# Patient Record
Sex: Female | Born: 1955 | Race: White | Hispanic: No | Marital: Married | Smoking: Never smoker
Health system: Southern US, Community
[De-identification: ages and names within clinical notes are randomized; demographics above are authoritative.]

## PROBLEM LIST (undated history)

## (undated) DIAGNOSIS — G4730 Sleep apnea, unspecified: Secondary | ICD-10-CM

## (undated) DIAGNOSIS — N809 Endometriosis, unspecified: Secondary | ICD-10-CM

## (undated) DIAGNOSIS — C50919 Malignant neoplasm of unspecified site of unspecified female breast: Secondary | ICD-10-CM

## (undated) DIAGNOSIS — I10 Essential (primary) hypertension: Secondary | ICD-10-CM

## (undated) DIAGNOSIS — Z923 Personal history of irradiation: Secondary | ICD-10-CM

## (undated) DIAGNOSIS — K219 Gastro-esophageal reflux disease without esophagitis: Secondary | ICD-10-CM

## (undated) DIAGNOSIS — F419 Anxiety disorder, unspecified: Secondary | ICD-10-CM

## (undated) DIAGNOSIS — S82409A Unspecified fracture of shaft of unspecified fibula, initial encounter for closed fracture: Secondary | ICD-10-CM

## (undated) DIAGNOSIS — M8580 Other specified disorders of bone density and structure, unspecified site: Secondary | ICD-10-CM

## (undated) DIAGNOSIS — C801 Malignant (primary) neoplasm, unspecified: Secondary | ICD-10-CM

## (undated) DIAGNOSIS — E039 Hypothyroidism, unspecified: Secondary | ICD-10-CM

## (undated) DIAGNOSIS — E785 Hyperlipidemia, unspecified: Secondary | ICD-10-CM

## (undated) DIAGNOSIS — C50211 Malignant neoplasm of upper-inner quadrant of right female breast: Secondary | ICD-10-CM

## (undated) DIAGNOSIS — J302 Other seasonal allergic rhinitis: Secondary | ICD-10-CM

## (undated) DIAGNOSIS — F32A Depression, unspecified: Secondary | ICD-10-CM

## (undated) HISTORY — PX: REDUCTION MAMMAPLASTY: SUR839

## (undated) HISTORY — PX: FOOT SURGERY: SHX648

## (undated) HISTORY — DX: Hyperlipidemia, unspecified: E78.5

## (undated) HISTORY — DX: Endometriosis, unspecified: N80.9

## (undated) HISTORY — DX: Other specified disorders of bone density and structure, unspecified site: M85.80

## (undated) HISTORY — DX: Malignant (primary) neoplasm, unspecified: C80.1

## (undated) HISTORY — DX: Hypothyroidism, unspecified: E03.9

## (undated) HISTORY — DX: Malignant neoplasm of upper-inner quadrant of right female breast: C50.211

## (undated) HISTORY — DX: Anxiety disorder, unspecified: F41.9

## (undated) HISTORY — DX: Depression, unspecified: F32.A

## (undated) HISTORY — DX: Essential (primary) hypertension: I10

---
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
## 1993-01-23 DIAGNOSIS — C801 Malignant (primary) neoplasm, unspecified: Secondary | ICD-10-CM

## 1993-01-23 HISTORY — DX: Malignant (primary) neoplasm, unspecified: C80.1

## 1994-01-23 DIAGNOSIS — Z85850 Personal history of malignant neoplasm of thyroid: Secondary | ICD-10-CM

## 1994-01-23 HISTORY — DX: Personal history of malignant neoplasm of thyroid: Z85.850

## 2003-01-24 HISTORY — PX: ABDOMINAL HYSTERECTOMY: SHX81

## 2004-01-11 ENCOUNTER — Inpatient Hospital Stay: Payer: Self-pay | Admitting: Obstetrics and Gynecology

## 2005-01-19 ENCOUNTER — Ambulatory Visit: Payer: Self-pay | Admitting: Internal Medicine

## 2005-04-21 ENCOUNTER — Ambulatory Visit: Payer: Self-pay | Admitting: Obstetrics and Gynecology

## 2005-08-29 ENCOUNTER — Ambulatory Visit: Payer: Self-pay | Admitting: Family Medicine

## 2006-02-20 LAB — HM COLONOSCOPY: HM Colonoscopy: ABNORMAL

## 2006-04-25 ENCOUNTER — Ambulatory Visit: Payer: Self-pay | Admitting: Obstetrics and Gynecology

## 2006-04-27 ENCOUNTER — Ambulatory Visit: Payer: Self-pay | Admitting: Obstetrics and Gynecology

## 2006-12-12 ENCOUNTER — Ambulatory Visit: Payer: Self-pay | Admitting: Gastroenterology

## 2006-12-24 HISTORY — PX: COLON SURGERY: SHX602

## 2006-12-26 ENCOUNTER — Ambulatory Visit: Payer: Self-pay | Admitting: Gastroenterology

## 2007-01-03 ENCOUNTER — Ambulatory Visit: Payer: Self-pay | Admitting: Surgery

## 2007-01-04 ENCOUNTER — Ambulatory Visit: Payer: Self-pay | Admitting: Family Medicine

## 2007-01-09 ENCOUNTER — Inpatient Hospital Stay: Payer: Self-pay | Admitting: Surgery

## 2007-01-24 DIAGNOSIS — N809 Endometriosis, unspecified: Secondary | ICD-10-CM

## 2007-01-24 HISTORY — PX: OTHER SURGICAL HISTORY: SHX169

## 2007-01-24 HISTORY — PX: COLONOSCOPY: SHX174

## 2007-01-24 HISTORY — DX: Endometriosis, unspecified: N80.9

## 2008-01-12 ENCOUNTER — Emergency Department: Payer: Self-pay | Admitting: Emergency Medicine

## 2009-11-20 LAB — HM PAP SMEAR: HM Pap smear: NORMAL

## 2010-08-08 ENCOUNTER — Ambulatory Visit: Payer: Self-pay | Admitting: Internal Medicine

## 2010-10-07 ENCOUNTER — Encounter: Payer: Self-pay | Admitting: Internal Medicine

## 2010-10-07 DIAGNOSIS — Z0289 Encounter for other administrative examinations: Secondary | ICD-10-CM

## 2010-10-12 ENCOUNTER — Ambulatory Visit (INDEPENDENT_AMBULATORY_CARE_PROVIDER_SITE_OTHER): Payer: BC Managed Care – PPO | Admitting: Internal Medicine

## 2010-10-12 ENCOUNTER — Encounter: Payer: Self-pay | Admitting: Internal Medicine

## 2010-10-12 VITALS — BP 155/109 | HR 90 | Temp 97.8°F | Resp 16 | Ht 61.5 in | Wt 151.0 lb

## 2010-10-12 DIAGNOSIS — J189 Pneumonia, unspecified organism: Secondary | ICD-10-CM

## 2010-10-12 DIAGNOSIS — F32A Depression, unspecified: Secondary | ICD-10-CM | POA: Insufficient documentation

## 2010-10-12 DIAGNOSIS — E785 Hyperlipidemia, unspecified: Secondary | ICD-10-CM

## 2010-10-12 DIAGNOSIS — E039 Hypothyroidism, unspecified: Secondary | ICD-10-CM

## 2010-10-12 DIAGNOSIS — F3289 Other specified depressive episodes: Secondary | ICD-10-CM

## 2010-10-12 DIAGNOSIS — F329 Major depressive disorder, single episode, unspecified: Secondary | ICD-10-CM

## 2010-10-12 MED ORDER — LEVOFLOXACIN 750 MG PO TABS: 750.0000 mg | ORAL_TABLET | Freq: Every day | ORAL | Status: AC

## 2010-10-12 MED ORDER — BENZONATATE 100 MG PO CAPS: 100.0000 mg | ORAL_CAPSULE | Freq: Three times a day (TID) | ORAL | Status: AC | PRN

## 2010-10-12 NOTE — Patient Instructions (Signed)
Pneumonia Pneumonia is an infection of the lungs. It may be caused by a bacteria or virus. Most forms are bacterial. Usually, these infections are caused by breathing infectious particles into the lungs (respiratory tract). SYMPTOMS  The most common problems (symptoms) are:   Cough.  Fever.   Chest pain.  Increased rate of breathing.   Wheezing.  Mucus production.   DIAGNOSIS  Often these infections are diagnosed on exam by your caregiver. Sometimes the diagnosis may require:   Chest X-rays.   Blood analysis.   Cultures. Blood cultures may be done to help find the cause of your pneumonia.  Your caregiver may do tests (blood gasses or pulse oximetry) to see how well your lungs are working. TREATMENT The bacterial pneumonias generally respond well to medicines (antibiotics) that kill germs. Viral infections must run their course. These infections will not respond to antibiotics. A pneumococcal shot (vaccine) is available to prevent a common bacterial pneumonia. This is usually suggested for the elderly and for other groups of higher risk individuals, such as those on chemotherapy or those who have problems with their immune system.  You will have pneumococcal screening or vaccination if you are over 65 years old and are not immunized.   If you are a smoker, it is time to quit. You may receive instructions on how to best stop smoking. Your caregiver can provide medicines and counseling to help you quit.  HOME CARE INSTRUCTIONS  Cough suppressants may be used if you are losing too much rest. However, coughing protects you by clearing your lungs. This is one reason for not using cough suppressants, if able, as they take away this protection.   Your caregiver may have prescribed an antibiotic if she or he feels your cough is caused by a bacterial infection. Take all your medicine until you are finished.   Your caregiver may also prescribe an expectorant to loosen the mucus to be coughed  up.   Only take over-the-counter or prescription medicines for pain, discomfort, or fever as directed by your caregiver.   Smoking is a common cause of bronchitis and can contribute to pneumonia. Stopping this habit is an important self-help step.   If you are a smoker and continue to smoke, your cough may last several weeks after your pneumonia has cleared.   A cold steam vaporizer or humidifier in your room or home may help loosen mucus.   Coughing is often worse at night. Sleeping in a semi-upright position in a recliner or using a couple pillows under your head will help with this.   Get rest as you feel it is needed. Your body will usually let you know when to rest.  SEEK IMMEDIATE MEDICAL CARE IF:  You develop pus-like mucus (sputum) or your illness becomes worse. This is especially true if you are elderly or weakened from any other disease.   You cannot control your cough with suppressants and are losing sleep.   You begin coughing up blood.   You develop pain which is getting worse or is uncontrolled with medicines.   You or your child has an oral temperature above 101.5, not controlled by medicine.   Any of the symptoms which initially brought you in for treatment are getting worse rather than better.   You develop shortness of breath or chest pain.  MAKE SURE YOU:   Understand these instructions.   Will watch your condition.   Will get help right away if you are not doing well or   get worse.  Document Released: 01/09/2005 Document Re-Released: 06/29/2009 [employer] Patient Information 2011 [employer]. 

## 2010-10-12 NOTE — Progress Notes (Signed)
Subjective:    Patient ID: Christie Ramirez, female    DOB: 12/06/1955, 55 y.o.   MRN: [id number]  HPI Ms. Boehringer is a 55 year old female with a history of hyperlipidemia and hypothyroidism who presents for an acute visit complaining of a six-day history of general malaise, cough, nasal congestion with purulent rhinorrhea,Headache, and bilateral ear pain. Patient reports that several others in her workplace are sick with similar symptoms. She reports that she has gotten progressively worse over the last 6 days. She reports fever and chills at home. She has had difficulty sleeping because of her cough. She has not taken any over-the-counter medications for this. She denies any shortness of breath, chest pain, palpitations.   Review of Systems  Constitutional: Negative for fever, chills, appetite change, fatigue and unexpected weight change.  HENT: Positive for ear pain, congestion, sore throat, rhinorrhea, postnasal drip and sinus pressure. Negative for trouble swallowing, neck pain and voice change.   Eyes: Negative for visual disturbance.  Respiratory: Positive for cough. Negative for chest tightness, shortness of breath, wheezing and stridor.   Cardiovascular: Negative for chest pain, palpitations and leg swelling.  Gastrointestinal: Negative for nausea, vomiting, abdominal pain, diarrhea, constipation, blood in stool, abdominal distention and anal bleeding.  Genitourinary: Negative for dysuria and flank pain.  Musculoskeletal: Positive for myalgias. Negative for arthralgias and gait problem.  Skin: Negative for color change and rash.  Neurological: Negative for dizziness and headaches.  Hematological: Negative for adenopathy. Does not bruise/bleed easily.  Psychiatric/Behavioral: Negative for suicidal ideas, sleep disturbance and dysphoric mood. The patient is not nervous/anxious.    BP 155/109  Pulse 90  Temp(Src) 97.8 F (36.6 C) (Oral)  Resp 16  Ht 5' 1.5" (1.562 m)  Wt 151 lb  (68.493 kg)  BMI 28.07 kg/m2  SpO2 95%     Objective:   Physical Exam  Constitutional: She is oriented to person, place, and time. She appears well-developed and well-nourished. She has a sickly appearance. No distress.  HENT:  Head: Normocephalic and atraumatic.  Right Ear: Hearing and external ear normal. No tenderness. Tympanic membrane is erythematous and bulging. A middle ear effusion is present.  Left Ear: Hearing and external ear normal. No tenderness. Tympanic membrane is bulging. Tympanic membrane is not erythematous. A middle ear effusion is present.  Nose: Right sinus exhibits maxillary sinus tenderness and frontal sinus tenderness. Left sinus exhibits maxillary sinus tenderness and frontal sinus tenderness.  Mouth/Throat: Mucous membranes are normal. Posterior oropharyngeal erythema present. No oropharyngeal exudate.  Eyes: Conjunctivae are normal. Pupils are equal, round, and reactive to light. Right eye exhibits no discharge. Left eye exhibits no discharge. No scleral icterus.  Neck: Normal range of motion. Neck supple. No tracheal deviation present. No thyromegaly present.  Cardiovascular: Normal rate, regular rhythm, normal heart sounds and intact distal pulses.  Exam reveals no gallop and no friction rub.   No murmur heard. Pulmonary/Chest: Effort normal. No accessory muscle usage. Not tachypneic. No respiratory distress. She has decreased breath sounds in the right lower field. She has no wheezes. She has rhonchi in the right lower field. She has no rales. She exhibits no tenderness.  Musculoskeletal: Normal range of motion. She exhibits no edema and no tenderness.  Lymphadenopathy:    She has no cervical adenopathy.  Neurological: She is alert and oriented to person, place, and time. No cranial nerve deficit. She exhibits normal muscle tone. Coordination normal.  Skin: Skin is warm and dry. No rash noted. She is not diaphoretic. No  erythema. No pallor.  Psychiatric: She has  a normal mood and affect. Her behavior is normal. Judgment and thought content normal.          Assessment & Plan:  1. Pneumonia - clinical presentation and exam are consistent with right lower lobe pneumonia. She also has findings suggestive of bilateral maxillary sinusitis. Will treat empirically with Levaquin 750 mg daily x7 days. She will use Tessalon Perles as needed twice daily for cough. She will call or return to clinic immediately should symptoms worsen or not improve over the next 48 hours. She will stay home from work and rest for the next 48hours. Otherwise she will followup in one month for her annual physical exam.  2. Hypothyroidism - patient is overdue for blood work including TSH. We'll check prior to her physical exam in one month. We'll continue Synthroid.  3. Hyperlipidemia - will check lipids prior to her physical exam. She will continue Lipitor.  4. Depression - currently well controlled on current medications. Will revisit at physical exam in one month.

## 2010-10-14 ENCOUNTER — Encounter: Payer: Self-pay | Admitting: Internal Medicine

## 2010-10-14 ENCOUNTER — Ambulatory Visit (INDEPENDENT_AMBULATORY_CARE_PROVIDER_SITE_OTHER): Payer: BC Managed Care – PPO | Admitting: Internal Medicine

## 2010-10-14 VITALS — BP 135/91 | HR 87 | Temp 98.6°F | Resp 16 | Ht 61.5 in | Wt 150.0 lb

## 2010-10-14 DIAGNOSIS — J189 Pneumonia, unspecified organism: Secondary | ICD-10-CM

## 2010-10-14 DIAGNOSIS — Z0000 Encounter for general adult medical examination without abnormal findings: Secondary | ICD-10-CM

## 2010-10-14 NOTE — Progress Notes (Signed)
Subjective:    Patient ID: Christie Ramirez, female    DOB: [birth year], 55 y.o.   MRN: [id number]  HPI Christie Ramirez is a 55 year old female with a recent history of upper respiratory infection and right middle lobe pneumonia. She was diagnosed with pneumonia on September 19. She started a course of Levaquin. She reports some improvement in her symptoms, however her symptoms have not completely resolved. Her ear pain and sinus pain have improved. She continues to have some coughing, particularly at night. She reports significant improvement in her coughing with Tessalon Perles. She denies any recent fever or chills. She reports continued fatigue. She has missed work over the last 2 days.  Outpatient Encounter Prescriptions as of 10/14/2010  Medication Sig Dispense Refill  . atorvastatin (LIPITOR) 10 MG tablet Take 10 mg by mouth daily.        . benzonatate (TESSALON PERLES) 100 MG capsule Take 1 capsule (100 mg total) by mouth 3 (three) times daily as needed for cough.  20 capsule  0  . clonazePAM (KLONOPIN) 1 MG tablet Take 1 mg by mouth 3 (three) times daily as needed.        Marland Kitchen levofloxacin (LEVAQUIN) 750 MG tablet Take 1 tablet (750 mg total) by mouth daily.  7 tablet  0  . levothyroxine (SYNTHROID, LEVOTHROID) 175 MCG tablet Take 175 mcg by mouth daily.        Marland Kitchen zolpidem (AMBIEN) 10 MG tablet Take 10 mg by mouth at bedtime as needed.          Review of Systems  Constitutional: Positive for fatigue. Negative for fever, chills and diaphoresis.  HENT: Positive for congestion, rhinorrhea and postnasal drip. Negative for sinus pressure.   Respiratory: Positive for cough. Negative for shortness of breath, wheezing and stridor.   Cardiovascular: Negative for chest pain.   BP 135/91  Pulse 87  Temp(Src) 98.6 F (37 C) (Oral)  Resp 16  Ht 5' 1.5" (1.562 m)  Wt 150 lb (68.04 kg)  BMI 27.88 kg/m2  SpO2 95%     Objective:   Physical Exam  Constitutional: She is oriented to person, place,  and time. She appears well-developed and well-nourished. No distress.  HENT:  Head: Normocephalic and atraumatic.  Right Ear: External ear normal. A middle ear effusion is present.  Left Ear: External ear normal. A middle ear effusion is present.  Nose: Nose normal.  Mouth/Throat: Oropharynx is clear and moist. No oropharyngeal exudate.  Eyes: Conjunctivae are normal. Pupils are equal, round, and reactive to light. Right eye exhibits no discharge. Left eye exhibits no discharge. No scleral icterus.  Neck: Normal range of motion. Neck supple. No tracheal deviation present. No thyromegaly present.  Cardiovascular: Normal rate, regular rhythm, normal heart sounds and intact distal pulses.  Exam reveals no gallop and no friction rub.   No murmur heard. Pulmonary/Chest: Effort normal. No accessory muscle usage. Not tachypneic. No respiratory distress. She has no wheezes. She has rales in the right middle field. She exhibits no tenderness.  Musculoskeletal: Normal range of motion. She exhibits no edema and no tenderness.  Lymphadenopathy:    She has no cervical adenopathy.  Neurological: She is alert and oriented to person, place, and time. No cranial nerve deficit. She exhibits normal muscle tone. Coordination normal.  Skin: Skin is warm and dry. No rash noted. She is not diaphoretic. No erythema. No pallor.  Psychiatric: She has a normal mood and affect. Her behavior is normal. Judgment and thought content normal.  Assessment & Plan:  1. Pneumonia - patient's symptoms and physical exam consistent with right middle lobe pneumonia. Her symptoms have improved and fever has resolved. She continues to have cough which is worse at night. This is improved with use of Tessalon Perles. We'll plan for her to continue her course of Levaquin for a total of 7 days. She will continue to use over-the-counter ibuprofen. She will remain out of work for the next 48 hours. She will call or return to clinic  should symptoms not continue to improve or should symptoms worsen.

## 2010-10-17 ENCOUNTER — Telehealth: Payer: Self-pay | Admitting: Internal Medicine

## 2010-10-17 NOTE — Telephone Encounter (Signed)
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 Please advise.

## 2010-11-03 ENCOUNTER — Other Ambulatory Visit: Payer: Self-pay | Admitting: Internal Medicine

## 2010-11-04 ENCOUNTER — Other Ambulatory Visit (INDEPENDENT_AMBULATORY_CARE_PROVIDER_SITE_OTHER): Payer: BC Managed Care – PPO | Admitting: *Deleted

## 2010-11-04 ENCOUNTER — Encounter: Payer: Self-pay | Admitting: Internal Medicine

## 2010-11-04 DIAGNOSIS — E039 Hypothyroidism, unspecified: Secondary | ICD-10-CM

## 2010-11-04 DIAGNOSIS — E785 Hyperlipidemia, unspecified: Secondary | ICD-10-CM

## 2010-11-04 DIAGNOSIS — Z0000 Encounter for general adult medical examination without abnormal findings: Secondary | ICD-10-CM

## 2010-11-04 LAB — COMPREHENSIVE METABOLIC PANEL
ALT: 36 U/L — ABNORMAL HIGH (ref 0–35)
AST: 27 U/L (ref 0–37)
Albumin: 4.2 g/dL (ref 3.5–5.2)
CO2: 28 mEq/L (ref 19–32)
Calcium: 9.4 mg/dL (ref 8.4–10.5)
Chloride: 106 mEq/L (ref 96–112)
Creatinine, Ser: 1 mg/dL (ref 0.4–1.2)
GFR: 62.82 mL/min (ref >60.00)
Potassium: 4.8 mEq/L (ref 3.5–5.1)
Sodium: 142 mEq/L (ref 135–145)
Total Protein: 7.3 g/dL (ref 6.0–8.3)

## 2010-11-04 LAB — CBC WITH DIFFERENTIAL/PLATELET
Basophils Absolute: 0 10*3/uL (ref 0.0–0.1)
Eosinophils Relative: 2.5 % (ref 0.0–5.0)
HCT: 38.5 % (ref 36.0–46.0)
Lymphocytes Relative: 36.1 % (ref 12.0–46.0)
Lymphs Abs: 2.3 10*3/uL (ref 0.7–4.0)
Monocytes Relative: 7.6 % (ref 3.0–12.0)
Platelets: 287 10*3/uL (ref 150.0–400.0)
RDW: 14.3 % (ref 11.5–14.6)
WBC: 6.4 10*3/uL (ref 4.5–10.5)

## 2010-11-04 LAB — LIPID PANEL: HDL: 63.3 mg/dL (ref >39.00)

## 2010-11-04 LAB — TSH: TSH: 0.58 u[IU]/mL (ref 0.35–5.50)

## 2010-11-04 LAB — LDL CHOLESTEROL, DIRECT: Direct LDL: 142.1 mg/dL

## 2010-11-05 LAB — ABO AND RH: Rh Type: POSITIVE

## 2010-11-11 ENCOUNTER — Encounter: Payer: BC Managed Care – PPO | Admitting: Internal Medicine

## 2010-11-21 ENCOUNTER — Ambulatory Visit (INDEPENDENT_AMBULATORY_CARE_PROVIDER_SITE_OTHER): Payer: BC Managed Care – PPO | Admitting: Internal Medicine

## 2010-11-21 ENCOUNTER — Encounter: Payer: Self-pay | Admitting: Internal Medicine

## 2010-11-21 VITALS — BP 170/110 | HR 90 | Temp 97.6°F | Resp 16 | Ht 61.5 in | Wt 150.0 lb

## 2010-11-21 DIAGNOSIS — M79609 Pain in unspecified limb: Secondary | ICD-10-CM

## 2010-11-21 DIAGNOSIS — Z23 Encounter for immunization: Secondary | ICD-10-CM

## 2010-11-21 DIAGNOSIS — E785 Hyperlipidemia, unspecified: Secondary | ICD-10-CM

## 2010-11-21 DIAGNOSIS — M79673 Pain in unspecified foot: Secondary | ICD-10-CM

## 2010-11-21 DIAGNOSIS — I10 Essential (primary) hypertension: Secondary | ICD-10-CM

## 2010-11-21 DIAGNOSIS — Z0000 Encounter for general adult medical examination without abnormal findings: Secondary | ICD-10-CM

## 2010-11-21 MED ORDER — ATORVASTATIN CALCIUM 20 MG PO TABS: 20.0000 mg | ORAL_TABLET | Freq: Every day | ORAL | Status: DC

## 2010-11-21 NOTE — Patient Instructions (Addendum)
Increase lipitor to 20mg  daily. Decrease intake of processed carbohydrates. Increase physical activity by walking or more most days of week.  Repeat labs in 3 months.  Hypertriglyceridemia  Diet for High blood levels of Triglycerides Most fats in food are triglycerides. Triglycerides in your blood are stored as fat in your body. High levels of triglycerides in your blood may put you at a greater risk for heart disease and stroke.  Normal triglyceride levels are less than 150 mg/dL. Borderline high levels are 150-199 mg/dl. High levels are 200 - 499 mg/dL, and very high triglyceride levels are greater than 500 mg/dL. The decision to treat high triglycerides is generally based on the level. For people with borderline or high triglyceride levels, treatment includes weight loss and exercise. Drugs are recommended for people with very high triglyceride levels. Many people who need treatment for high triglyceride levels have metabolic syndrome. This syndrome is a collection of disorders that often include: insulin resistance, high blood pressure, blood clotting problems, high cholesterol and triglycerides. TESTING PROCEDURE FOR TRIGLYCERIDES  You should not eat 4 hours before getting your triglycerides measured. The normal range of triglycerides is between 10 and 250 milligrams per deciliter (mg/dl). Some people may have extreme levels (1000 or above), but your triglyceride level may be too high if it is above 150 mg/dl, depending on what other risk factors you have for heart disease.   People with high blood triglycerides may also have high blood cholesterol levels. If you have high blood cholesterol as well as high blood triglycerides, your risk for heart disease is probably greater than if you only had high triglycerides. High blood cholesterol is one of the main risk factors for heart disease.  CHANGING YOUR DIET  Your weight can affect your blood triglyceride level. If you are more than 20% above  your ideal body weight, you may be able to lower your blood triglycerides by losing weight. Eating less and exercising regularly is the best way to combat this. Fat provides more calories than any other food. The best way to lose weight is to eat less fat. Only 30% of your total calories should come from fat. Less than 7% of your diet should come from saturated fat. A diet low in fat and saturated fat is the same as a diet to decrease blood cholesterol. By eating a diet lower in fat, you may lose weight, lower your blood cholesterol, and lower your blood triglyceride level.  Eating a diet low in fat, especially saturated fat, may also help you lower your blood triglyceride level. Ask your dietitian to help you figure how much fat you can eat based on the number of calories your caregiver has prescribed for you.  Exercise, in addition to helping with weight loss may also help lower triglyceride levels.   Alcohol can increase blood triglycerides. You may need to stop drinking alcoholic beverages.   Too much carbohydrate in your diet may also increase your blood triglycerides. Some complex carbohydrates are necessary in your diet. These may include bread, rice, potatoes, other starchy vegetables and cereals.   Reduce "simple" carbohydrates. These may include pure sugars, candy, honey, and jelly without losing other nutrients. If you have the kind of high blood triglycerides that is affected by the amount of carbohydrates in your diet, you will need to eat less sugar and less high-sugar foods. Your caregiver can help you with this.   Adding 2-4 grams of fish oil (EPA+ DHA) may also help lower triglycerides.  Speak with your caregiver before adding any supplements to your regimen.  Following the Diet  Maintain your ideal weight. Your caregivers can help you with a diet. Generally, eating less food and getting more exercise will help you lose weight. Joining a weight control group may also help. Ask your  caregivers for a good weight control group in your area.  Eat low-fat foods instead of high-fat foods. This can help you lose weight too.  These foods are lower in fat. Eat MORE of these:   Dried beans, peas, and lentils.   Egg whites.   Low-fat cottage cheese.   Fish.   Lean cuts of meat, such as round, sirloin, rump, and flank (cut extra fat off meat you fix).   Whole grain breads, cereals and pasta.   Skim and nonfat dry milk.   Low-fat yogurt.   Poultry without the skin.   Cheese made with skim or part-skim milk, such as mozzarella, parmesan, farmers', ricotta, or pot cheese.  These are higher fat foods. Eat LESS of these:   Whole milk and foods made from whole milk, such as American, blue, cheddar, monterey jack, and swiss cheese   High-fat meats, such as luncheon meats, sausages, knockwurst, bratwurst, hot dogs, ribs, corned beef, ground pork, and regular ground beef.   Fried foods.  Limit saturated fats in your diet. Substituting unsaturated fat for saturated fat may decrease your blood triglyceride level. You will need to read package labels to know which products contain saturated fats.  These foods are high in saturated fat. Eat LESS of these:   Fried pork skins.   Whole milk.   Skin and fat from poultry.   Palm oil.   Butter.   Shortening.   Cream cheese.   Tomasa Blase.   Margarines and baked goods made from listed oils.   Vegetable shortenings.   Chitterlings.   Fat from meats.   Coconut oil.   Palm kernel oil.   Lard.   Cream.   Sour cream.   Fatback.   Coffee whiteners and non-dairy creamers made with these oils.   Cheese made from whole milk.  Use unsaturated fats (both polyunsaturated and monounsaturated) moderately. Remember, even though unsaturated fats are better than saturated fats; you still want a diet low in total fat.  These foods are high in unsaturated fat:   Canola oil.   Sunflower oil.   Mayonnaise.   Almonds.    Peanuts.   Pine nuts.   Margarines made with these oils.   Safflower oil.   Olive oil.   Avocados.   Cashews.   Peanut butter.   Sunflower seeds.   Soybean oil.   Peanut oil.   Olives.   Pecans.   Walnuts.   Pumpkin seeds.  Avoid sugar and other high-sugar foods. This will decrease carbohydrates without decreasing other nutrients. Sugar in your food goes rapidly to your blood. When there is excess sugar in your blood, your liver may use it to make more triglycerides. Sugar also contains calories without other important nutrients.  Eat LESS of these:   Sugar, brown sugar, powdered sugar, jam, jelly, preserves, honey, syrup, molasses, pies, candy, cakes, cookies, frosting, pastries, colas, soft drinks, punches, fruit drinks, and regular gelatin.   Avoid alcohol. Alcohol, even more than sugar, may increase blood triglycerides. In addition, alcohol is high in calories and low in nutrients. Ask for sparkling water, or a diet soft drink instead of an alcoholic beverage.  Suggestions for planning and preparing  meals   Bake, broil, grill or roast meats instead of frying.   Remove fat from meats and skin from poultry before cooking.   Add spices, herbs, lemon juice or vinegar to vegetables instead of salt, rich sauces or gravies.   Use a non-stick skillet without fat or use no-stick sprays.   Cool and refrigerate stews and broth. Then remove the hardened fat floating on the surface before serving.   Refrigerate meat drippings and skim off fat to make low-fat gravies.   Serve more fish.   Use less butter, margarine and other high-fat spreads on bread or vegetables.   Use skim or reconstituted non-fat dry milk for cooking.   Cook with low-fat cheeses.   Substitute low-fat yogurt or cottage cheese for all or part of the sour cream in recipes for sauces, dips or congealed salads.   Use half yogurt/half mayonnaise in salad recipes.   Substitute evaporated skim milk  for cream. Evaporated skim milk or reconstituted non-fat dry milk can be whipped and substituted for whipped cream in certain recipes.   Choose fresh fruits for dessert instead of high-fat foods such as pies or cakes. Fruits are naturally low in fat.  When Dining Out   Order low-fat appetizers such as fruit or vegetable juice, pasta with vegetables or tomato sauce.   Select clear, rather than cream soups.   Ask that dressings and gravies be served on the side. Then use less of them.   Order foods that are baked, broiled, poached, steamed, stir-fried, or roasted.   Ask for margarine instead of butter, and use only a small amount.   Drink sparkling water, unsweetened tea or coffee, or diet soft drinks instead of alcohol or other sweet beverages.  QUESTIONS AND ANSWERS ABOUT OTHER FATS IN THE BLOOD: SATURATED FAT, TRANS FAT, AND CHOLESTEROL What is trans fat? Trans fat is a type of fat that is formed when vegetable oil is hardened through a process called hydrogenation. This process helps makes foods more solid, gives them shape, and prolongs their shelf life. Trans fats are also called hydrogenated or partially hydrogenated oils.  What do saturated fat, trans fat, and cholesterol in foods have to do with heart disease? Saturated fat, trans fat, and cholesterol in the diet all raise the level of LDL "bad" cholesterol in the blood. The higher the LDL cholesterol, the greater the risk for coronary heart disease (CHD). Saturated fat and trans fat raise LDL similarly.  What foods contain saturated fat, trans fat, and cholesterol? High amounts of saturated fat are found in animal products, such as fatty cuts of meat, chicken skin, and full-fat dairy products like butter, whole milk, cream, and cheese, and in tropical vegetable oils such as palm, palm kernel, and coconut oil. Trans fat is found in some of the same foods as saturated fat, such as vegetable shortening, some margarines (especially hard or  stick margarine), crackers, cookies, baked goods, fried foods, salad dressings, and other processed foods made with partially hydrogenated vegetable oils. Small amounts of trans fat also occur naturally in some animal products, such as milk products, beef, and lamb. Foods high in cholesterol include liver, other organ meats, egg yolks, shrimp, and full-fat dairy products. How can I use the new food label to make heart-healthy food choices? Check the Nutrition Facts panel of the food label. Choose foods lower in saturated fat, trans fat, and cholesterol. For saturated fat and cholesterol, you can also use the Percent Daily Value (%DV): 5% DV  or less is low, and 20% DV or more is high. (There is no %DV for trans fat.) Use the Nutrition Facts panel to choose foods low in saturated fat and cholesterol, and if the trans fat is not listed, read the ingredients and limit products that list shortening or hydrogenated or partially hydrogenated vegetable oil, which tend to be high in trans fat. POINTS TO REMEMBER: YOU NEED A LITTLE TLC (THERAPEUTIC LIFESTYLE CHANGES)  Discuss your risk for heart disease with your caregivers, and take steps to reduce risk factors.   Change your diet. Choose foods that are low in saturated fat, trans fat, and cholesterol.   Add exercise to your daily routine if it is not already being done. Participate in physical activity of moderate intensity, like brisk walking, for at least 30 minutes on most, and preferably all days of the week. No time? Break the 30 minutes into three, 10-minute segments during the day.   Stop smoking. If you do smoke, contact your caregiver to discuss ways in which they can help you quit.   Do not use street drugs.   Maintain a normal weight.   Maintain a healthy blood pressure.   Keep up with your blood work for checking the fats in your blood as directed by your caregiver.  Document Released: 10/28/2003 Document Revised: 09/21/2010 Document  Reviewed: 05/25/2008 [facility] Patient Information 2012 [place], [place].

## 2010-11-21 NOTE — Progress Notes (Signed)
Subjective:    Patient ID: Christie Ramirez, female    DOB: 04/06/1955, 55 y.o.   MRN: [id number]  HPI 55 year old female presents for her annual exam. She is tearful today, reporting recent breakup with her fianc. She has had several recent stressors including this breakup, and a change in her job with a significant cut in her pay. She reports sadness with these recent changes. She denies any suicidal ideation. She reports reasonable control and her anxiety with the use of her Klonopin.  Aside from this, she reports no concerns today. We reviewed her labs which were recently performed including lipid profile which showed elevated triglycerides. She reports recent increase in intake of processed carbohydrates. She also reports drinking approximately 2-3 glasses of wine most nights.  Outpatient Encounter Prescriptions as of 11/21/2010  Medication Sig Dispense Refill  . atorvastatin (LIPITOR) 20 MG tablet Take 1 tablet (20 mg total) by mouth daily.  30 tablet  3  . clonazePAM (KLONOPIN) 1 MG tablet Take 1 mg by mouth 3 (three) times daily as needed.        Marland Kitchen escitalopram (LEXAPRO) 20 MG tablet       . levothyroxine (SYNTHROID, LEVOTHROID) 175 MCG tablet Take 175 mcg by mouth daily.          Review of Systems  Constitutional: Negative for fever, chills, appetite change, fatigue and unexpected weight change.  HENT: Negative for ear pain, congestion, sore throat, trouble swallowing, neck pain, voice change and sinus pressure.   Eyes: Negative for visual disturbance.  Respiratory: Negative for cough, shortness of breath, wheezing and stridor.   Cardiovascular: Negative for chest pain, palpitations and leg swelling.  Gastrointestinal: Negative for nausea, vomiting, abdominal pain, diarrhea, constipation, blood in stool, abdominal distention and anal bleeding.  Genitourinary: Negative for dysuria and flank pain.  Musculoskeletal: Negative for myalgias, arthralgias and gait problem.  Skin: Negative  for color change and rash.  Neurological: Negative for dizziness and headaches.  Hematological: Negative for adenopathy. Does not bruise/bleed easily.  Psychiatric/Behavioral: Positive for dysphoric mood. Negative for suicidal ideas and sleep disturbance. The patient is nervous/anxious.    BP 170/110  Pulse 90  Temp(Src) 97.6 F (36.4 C) (Oral)  Resp 16  Ht 5' 1.5" (1.562 m)  Wt 150 lb (68.04 kg)  BMI 27.88 kg/m2  SpO2 96% Repeat BP 152/102    Objective:   Physical Exam  Constitutional: She is oriented to person, place, and time. She appears well-developed and well-nourished. No distress.  HENT:  Head: Normocephalic and atraumatic.  Right Ear: External ear normal.  Left Ear: External ear normal.  Nose: Nose normal.  Mouth/Throat: Oropharynx is clear and moist. No oropharyngeal exudate.  Eyes: Conjunctivae are normal. Pupils are equal, round, and reactive to light. Right eye exhibits no discharge. Left eye exhibits no discharge. No scleral icterus.  Neck: Normal range of motion. Neck supple. No tracheal deviation present. No thyromegaly present.  Cardiovascular: Normal rate, regular rhythm, normal heart sounds and intact distal pulses.  Exam reveals no gallop and no friction rub.   No murmur heard. Pulmonary/Chest: Effort normal and breath sounds normal. No respiratory distress. She has no wheezes. She has no rales. She exhibits no tenderness. Right breast exhibits no inverted nipple, no mass, no nipple discharge, no skin change and no tenderness. Left breast exhibits no inverted nipple, no mass, no nipple discharge, no skin change and no tenderness. Breasts are symmetrical.  Musculoskeletal: Normal range of motion. She exhibits no edema and no tenderness.  Feet:  Lymphadenopathy:    She has no cervical adenopathy.  Neurological: She is alert and oriented to person, place, and time. No cranial nerve deficit. She exhibits normal muscle tone. Coordination normal.  Skin: Skin is  warm and dry. No rash noted. She is not diaphoretic. No erythema. No pallor.  Psychiatric: Her behavior is normal. Judgment and thought content normal. She exhibits a depressed mood.          Assessment & Plan:  1. General exam - exam is normal today including breast exam. Pap is deferred as normal last year. We reviewed recent lab work and I encouraged a diet lower in processed carbohydrates. We will also increase her Lipitor as below. She is up-to-date on mammogram and colonoscopy. She will have a flu shot today.  2. Hyperlipidemia -triglycerides markedly elevated on labs. Will increase her Lipitor to 20 mg daily. We discussed adding Niaspan. We'll first try dietary intervention and she was given handout on lowering triglycerides. We'll repeat lipid profile in 3 months.  3. Depression -offered support today. Recent exacerbation secondary to recent breakup with her fianc. She is followed by psychology and is on Klonopin and Lexapro. We'll continue to monitor. She will call or return to clinic if symptoms are worsening.  4. Hypertension -. Blood pressure elevated today. Suspect this is exacerbated by anxiety and she is tearful on exam today. We'll plan to bring her back in one week for recheck of blood pressure. Recent renal function on labs was normal.  5. Foot pain - exam remarkable for erythema and edema over bilateral first MTP joints. Patient reports is chronic. Suspect she would benefit from orthotics. We'll set her up with podiatry.

## 2011-01-23 ENCOUNTER — Ambulatory Visit (INDEPENDENT_AMBULATORY_CARE_PROVIDER_SITE_OTHER): Payer: BC Managed Care – PPO | Admitting: Internal Medicine

## 2011-01-23 DIAGNOSIS — L0231 Cutaneous abscess of buttock: Secondary | ICD-10-CM

## 2011-01-23 DIAGNOSIS — L03317 Cellulitis of buttock: Secondary | ICD-10-CM

## 2011-01-23 MED ORDER — ACYCLOVIR 400 MG PO TABS: 400.0000 mg | ORAL_TABLET | ORAL | Status: AC

## 2011-01-23 MED ORDER — SULFAMETHOXAZOLE-TRIMETHOPRIM 800-160 MG PO TABS: 1.0000 | ORAL_TABLET | Freq: Two times a day (BID) | ORAL | Status: AC

## 2011-01-23 NOTE — Progress Notes (Signed)
Subjective:    Patient ID: Christie Ramirez, female    DOB: 02/21/1955, 55 y.o.   MRN: [id number]  HPI  Ms. Geffrard is a 55 yr old white female who presents with a pruritic irritated rash on her right buttock near the natal cleft which she first noticed one day earlier.  No recent change in detergents, sexual encounters, or skin contact with pulbic gym equipment or hotel sheets.  She has a history of chicken pox as a child but not history of shingles. No prior history of cellulitis or furuncles. She did spend the week prior to presentation at her step mother's home in [place] due to the ailing health of both parents, and flew back 4 days ago.  She denies fevers,  No severe pain , but does not pburning and itching at site of rash.  Has not applied anything topical to rash.  Past Medical History  Diagnosis Date  . Hyperlipidemia   . Hypothyroidism   . Anxiety    Current Outpatient Prescriptions on File Prior to Visit  Medication Sig Dispense Refill  . atorvastatin (LIPITOR) 20 MG tablet Take 1 tablet (20 mg total) by mouth daily.  30 tablet  3  . clonazePAM (KLONOPIN) 1 MG tablet Take 1 mg by mouth 3 (three) times daily as needed.        Marland Kitchen escitalopram (LEXAPRO) 20 MG tablet       . levothyroxine (SYNTHROID, LEVOTHROID) 175 MCG tablet Take 175 mcg by mouth daily.           Review of Systems  Constitutional: Negative for fever, chills, appetite change, fatigue and unexpected weight change.  HENT: Negative for ear pain, congestion, sore throat, trouble swallowing, neck pain, voice change and sinus pressure.   Eyes: Negative for visual disturbance.  Respiratory: Negative for cough, shortness of breath, wheezing and stridor.   Cardiovascular: Negative for chest pain, palpitations and leg swelling.  Gastrointestinal: Negative for nausea, vomiting, abdominal pain, diarrhea, constipation, blood in stool, abdominal distention and anal bleeding.  Genitourinary: Negative for dysuria and flank pain.    Musculoskeletal: Negative for myalgias, arthralgias and gait problem.  Skin: Positive for color change and rash.  Neurological: Negative for dizziness and headaches.  Hematological: Negative for adenopathy. Does not bruise/bleed easily.  Psychiatric/Behavioral: Positive for dysphoric mood. Negative for suicidal ideas and sleep disturbance. The patient is nervous/anxious.        Objective:   Physical Exam  Skin: Rash noted. Rash is macular.          Assessment & Plan:  New onset rash: locations and appearance are almost petechial,  unclear if this is contact dermatitis vs cellulitis vs shingles.  Will treat for MRSA cellultis,  adnn acycloiviro feo development of vesicles.     Updated Medication List Outpatient Encounter Prescriptions as of 01/23/2011  Medication Sig Dispense Refill  . atorvastatin (LIPITOR) 20 MG tablet Take 1 tablet (20 mg total) by mouth daily.  30 tablet  3  . clonazePAM (KLONOPIN) 1 MG tablet Take 1 mg by mouth 3 (three) times daily as needed.        Marland Kitchen escitalopram (LEXAPRO) 20 MG tablet       . levothyroxine (SYNTHROID, LEVOTHROID) 175 MCG tablet Take 175 mcg by mouth daily.        Marland Kitchen acyclovir (ZOVIRAX) 400 MG tablet Take 1 tablet (400 mg total) by mouth every 4 (four) hours while awake.  25 tablet  0  . sulfamethoxazole-trimethoprim (SEPTRA DS) 800-160 MG  per tablet Take 1 tablet by mouth 2 (two) times daily.  14 tablet  0

## 2011-01-23 NOTE — Patient Instructions (Signed)
I am goin g to treat your for a staph infection with Septra DS  Twice daily for 7 days.  If you develop blistering areas on same side,  Start the acyclovir for shingles.

## 2011-01-30 ENCOUNTER — Other Ambulatory Visit: Payer: Self-pay | Admitting: *Deleted

## 2011-01-30 DIAGNOSIS — E785 Hyperlipidemia, unspecified: Secondary | ICD-10-CM

## 2011-01-30 MED ORDER — ATORVASTATIN CALCIUM 20 MG PO TABS: 20.0000 mg | ORAL_TABLET | Freq: Every day | ORAL | Status: DC

## 2011-02-21 ENCOUNTER — Ambulatory Visit (INDEPENDENT_AMBULATORY_CARE_PROVIDER_SITE_OTHER): Payer: BC Managed Care – PPO | Admitting: Internal Medicine

## 2011-02-21 ENCOUNTER — Encounter: Payer: Self-pay | Admitting: Internal Medicine

## 2011-02-21 VITALS — BP 146/98 | HR 94 | Temp 97.9°F | Ht 61.0 in | Wt 150.0 lb

## 2011-02-21 DIAGNOSIS — T23201A Burn of second degree of right hand, unspecified site, initial encounter: Secondary | ICD-10-CM

## 2011-02-21 DIAGNOSIS — R202 Paresthesia of skin: Secondary | ICD-10-CM

## 2011-02-21 DIAGNOSIS — F32A Depression, unspecified: Secondary | ICD-10-CM

## 2011-02-21 DIAGNOSIS — E785 Hyperlipidemia, unspecified: Secondary | ICD-10-CM

## 2011-02-21 DIAGNOSIS — F329 Major depressive disorder, single episode, unspecified: Secondary | ICD-10-CM

## 2011-02-21 DIAGNOSIS — T23202A Burn of second degree of left hand, unspecified site, initial encounter: Secondary | ICD-10-CM | POA: Insufficient documentation

## 2011-02-21 DIAGNOSIS — F3289 Other specified depressive episodes: Secondary | ICD-10-CM

## 2011-02-21 DIAGNOSIS — R200 Anesthesia of skin: Secondary | ICD-10-CM | POA: Insufficient documentation

## 2011-02-21 DIAGNOSIS — R209 Unspecified disturbances of skin sensation: Secondary | ICD-10-CM

## 2011-02-21 DIAGNOSIS — T23209A Burn of second degree of unspecified hand, unspecified site, initial encounter: Secondary | ICD-10-CM

## 2011-02-21 LAB — COMPREHENSIVE METABOLIC PANEL
AST: 29 U/L (ref 0–37)
Albumin: 4.1 g/dL (ref 3.5–5.2)
BUN: 25 mg/dL — ABNORMAL HIGH (ref 6–23)
Calcium: 9.2 mg/dL (ref 8.4–10.5)
Chloride: 105 mEq/L (ref 96–112)
Potassium: 4.4 mEq/L (ref 3.5–5.1)
Sodium: 141 mEq/L (ref 135–145)
Total Protein: 7.1 g/dL (ref 6.0–8.3)

## 2011-02-21 LAB — LIPID PANEL
Total CHOL/HDL Ratio: 3
Triglycerides: 182 mg/dL — ABNORMAL HIGH (ref 0.0–149.0)

## 2011-02-21 LAB — LDL CHOLESTEROL, DIRECT: Direct LDL: 116.8 mg/dL

## 2011-02-21 MED ORDER — SILVER SULFADIAZINE 1 % EX CREA: TOPICAL_CREAM | Freq: Every day | CUTANEOUS | Status: DC
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
## 2011-02-21 NOTE — Assessment & Plan Note (Signed)
Likely secondary to carpal tunnel. We discussed using wrist braces at night. If no improvement, would favor setting up EMG testing.

## 2011-02-21 NOTE — Assessment & Plan Note (Signed)
On  Lipitor. Tolerating well. Will repeat lipids and LFTs with labs today. Goal LDL<100.

## 2011-02-21 NOTE — Assessment & Plan Note (Signed)
Symptoms improved. Coping well.  Will continue to monitor. Continue Lexapro.
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
## 2011-03-08 ENCOUNTER — Telehealth: Payer: Self-pay | Admitting: *Deleted

## 2011-03-08 MED ORDER — ATORVASTATIN CALCIUM 40 MG PO TABS: 40.0000 mg | ORAL_TABLET | Freq: Every day | ORAL | Status: DC

## 2011-03-08 NOTE — Telephone Encounter (Signed)
Rx sent in

## 2011-03-08 NOTE — Telephone Encounter (Signed)
Message copied by Vernie Murders on Wed Mar 08, 2011  6:16 PM ------      Message from: Ronna Polio A      Created: Tue Feb 21, 2011  8:35 PM       Labs are improved, but cholesterol still not at goal. Increase Lipitor to 40mg  daily. Repeat LFTs and lipids in 45month.

## 2011-05-04 ENCOUNTER — Telehealth: Payer: Self-pay | Admitting: Internal Medicine
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
## 2011-05-05 ENCOUNTER — Telehealth: Payer: Self-pay | Admitting: *Deleted

## 2011-05-05 ENCOUNTER — Telehealth: Payer: Self-pay | Admitting: Internal Medicine

## 2011-05-05 NOTE — Telephone Encounter (Signed)
There is another note regarding this same issue. Will close this note  See other note
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
## 2011-05-05 NOTE — Telephone Encounter (Signed)
I have tried calling patient times two, but unable to reach her. Will try again later.
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
## 2011-05-05 NOTE — Telephone Encounter (Signed)
I have tried calling patient times 2, but unable to reach her. Will try again later.
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
## 2011-05-05 NOTE — Telephone Encounter (Signed)
Left message asking patient to call back

## 2011-05-08 NOTE — Telephone Encounter (Signed)
Patient says that she has not had evaluation with [facility]. She is going to schedule appt with them and then will call and schedule appt to see DR. Walker afterwards .

## 2011-05-18 ENCOUNTER — Encounter: Payer: Self-pay | Admitting: Internal Medicine

## 2011-05-18 ENCOUNTER — Ambulatory Visit (INDEPENDENT_AMBULATORY_CARE_PROVIDER_SITE_OTHER): Payer: BC Managed Care – PPO | Admitting: Internal Medicine

## 2011-05-18 VITALS — BP 119/84 | HR 86 | Ht 61.0 in | Wt 144.0 lb

## 2011-05-18 DIAGNOSIS — F419 Anxiety disorder, unspecified: Secondary | ICD-10-CM | POA: Insufficient documentation

## 2011-05-18 DIAGNOSIS — I10 Essential (primary) hypertension: Secondary | ICD-10-CM

## 2011-05-18 DIAGNOSIS — F411 Generalized anxiety disorder: Secondary | ICD-10-CM

## 2011-05-18 DIAGNOSIS — E785 Hyperlipidemia, unspecified: Secondary | ICD-10-CM

## 2011-05-18 DIAGNOSIS — E039 Hypothyroidism, unspecified: Secondary | ICD-10-CM

## 2011-05-18 DIAGNOSIS — F32A Depression, unspecified: Secondary | ICD-10-CM | POA: Insufficient documentation

## 2011-05-18 MED ORDER — LISINOPRIL 5 MG PO TABS: 5.0000 mg | ORAL_TABLET | Freq: Every day | ORAL | Status: DC

## 2011-05-18 MED ORDER — CLONAZEPAM 1 MG PO TABS: 1.0000 mg | ORAL_TABLET | Freq: Three times a day (TID) | ORAL | Status: DC | PRN

## 2011-05-18 MED ORDER — LEVOTHYROXINE SODIUM 175 MCG PO TABS: 175.0000 ug | ORAL_TABLET | Freq: Every day | ORAL | Status: DC

## 2011-05-18 NOTE — Assessment & Plan Note (Signed)
Symptoms recently worsen with a layoff from her job. Offered support today. Will continue Lexapro and Klonopin as needed, as this medication has been helpful for her.

## 2011-05-18 NOTE — Assessment & Plan Note (Signed)
Blood pressure has been recently elevated. Will start lisinopril 5 mg daily. We discussed the potential side effects of this medication including cough. Patient will have renal function and electrolytes checked in one week. She will followup in one month. In the interim before her next visit, she will record blood pressure readings and e-mail or call with results.

## 2011-05-18 NOTE — Progress Notes (Signed)
Subjective:    Patient ID: Christie Ramirez, female    DOB: [birth year], 56 y.o.   MRN: [id number]  HPI 56 year old female presents for acute visit because of recent elevated blood pressures. She notes she was seen by her OB/GYN and blood pressure was noted to be elevated, near 160/100. She has had intermittent episodes of elevated blood pressure in the past. She has never been on medication for this. She denies any chest pain, palpitations, shortness of breath, or headache. She does note recent increase in her anxiety level, related to work issues. She has been using Clonopin with improvement in her anxiety symptoms. She also takes Lexapro, which she finds helpful.  Outpatient Encounter Prescriptions as of 05/18/2011  Medication Sig Dispense Refill  . atorvastatin (LIPITOR) 40 MG tablet Take 1 tablet (40 mg total) by mouth daily.  90 tablet  1  . clonazePAM (KLONOPIN) 1 MG tablet Take 1 tablet (1 mg total) by mouth 3 (three) times daily as needed.  90 tablet  3  . escitalopram (LEXAPRO) 20 MG tablet Take 20 mg by mouth daily.       Marland Kitchen levothyroxine (SYNTHROID, LEVOTHROID) 175 MCG tablet Take 1 tablet (175 mcg total) by mouth daily.  30 tablet  6  . Multiple Vitamins-Minerals (MULTIVITAMIN WITH MINERALS) tablet Take 1 tablet by mouth daily.      . silver sulfADIAZINE (SILVADENE) 1 % cream Apply topically daily.  50 g  0  . zolpidem (AMBIEN) 10 MG tablet Take 1 tablet by mouth At bedtime.      Marland Kitchen lisinopril (PRINIVIL,ZESTRIL) 5 MG tablet Take 1 tablet (5 mg total) by mouth daily.  30 tablet  3    Review of Systems  Constitutional: Negative for fever, chills, appetite change, fatigue and unexpected weight change.  HENT: Negative for ear pain, congestion, sore throat, trouble swallowing, neck pain, voice change and sinus pressure.   Eyes: Negative for visual disturbance.  Respiratory: Negative for cough, shortness of breath, wheezing and stridor.   Cardiovascular: Negative for chest pain,  palpitations and leg swelling.  Gastrointestinal: Negative for nausea, vomiting, abdominal pain, diarrhea, constipation, blood in stool, abdominal distention and anal bleeding.  Genitourinary: Negative for dysuria and flank pain.  Musculoskeletal: Negative for myalgias, arthralgias and gait problem.  Skin: Negative for color change and rash.  Neurological: Negative for dizziness and headaches.  Hematological: Negative for adenopathy. Does not bruise/bleed easily.  Psychiatric/Behavioral: Positive for dysphoric mood. Negative for suicidal ideas and sleep disturbance. The patient is nervous/anxious.    BP 119/84  Pulse 86  Ht 5\' 1"  (1.549 m)  Wt 144 lb (65.318 kg)  BMI 27.21 kg/m2     Objective:   Physical Exam  Constitutional: She is oriented to person, place, and time. She appears well-developed and well-nourished. No distress.  HENT:  Head: Normocephalic and atraumatic.  Right Ear: External ear normal.  Left Ear: External ear normal.  Nose: Nose normal.  Mouth/Throat: Oropharynx is clear and moist. No oropharyngeal exudate.  Eyes: Conjunctivae are normal. Pupils are equal, round, and reactive to light. Right eye exhibits no discharge. Left eye exhibits no discharge. No scleral icterus.  Neck: Normal range of motion. Neck supple. No tracheal deviation present. No thyromegaly present.  Cardiovascular: Normal rate, regular rhythm, normal heart sounds and intact distal pulses.  Exam reveals no gallop and no friction rub.   No murmur heard. Pulmonary/Chest: Effort normal and breath sounds normal. No respiratory distress. She has no wheezes. She has no rales. She  exhibits no tenderness.  Musculoskeletal: Normal range of motion. She exhibits no edema and no tenderness.  Lymphadenopathy:    She has no cervical adenopathy.  Neurological: She is alert and oriented to person, place, and time. No cranial nerve deficit. She exhibits normal muscle tone. Coordination normal.  Skin: Skin is warm  and dry. No rash noted. She is not diaphoretic. No erythema. No pallor.  Psychiatric: She has a normal mood and affect. Her behavior is normal. Judgment and thought content normal.          Assessment & Plan:

## 2011-05-18 NOTE — Patient Instructions (Signed)
Start lisinopril. Labs checked next week.

## 2011-05-26 ENCOUNTER — Other Ambulatory Visit: Payer: BC Managed Care – PPO

## 2011-06-13 ENCOUNTER — Other Ambulatory Visit: Payer: Self-pay | Admitting: Internal Medicine

## 2011-06-13 MED ORDER — ATORVASTATIN CALCIUM 40 MG PO TABS: 40.0000 mg | ORAL_TABLET | Freq: Every day | ORAL | Status: DC

## 2011-06-26 ENCOUNTER — Encounter: Payer: Self-pay | Admitting: Internal Medicine

## 2011-06-26 ENCOUNTER — Ambulatory Visit (INDEPENDENT_AMBULATORY_CARE_PROVIDER_SITE_OTHER): Payer: BC Managed Care – PPO | Admitting: Internal Medicine

## 2011-06-26 VITALS — BP 160/110 | HR 76 | Temp 97.6°F | Ht 61.0 in | Wt 145.2 lb

## 2011-06-26 DIAGNOSIS — E785 Hyperlipidemia, unspecified: Secondary | ICD-10-CM | POA: Insufficient documentation

## 2011-06-26 DIAGNOSIS — I10 Essential (primary) hypertension: Secondary | ICD-10-CM

## 2011-06-26 LAB — LIPID PANEL
HDL: 62.8 mg/dL (ref >39.00)
Total CHOL/HDL Ratio: 3
Triglycerides: 188 mg/dL — ABNORMAL HIGH (ref 0.0–149.0)

## 2011-06-26 LAB — COMPREHENSIVE METABOLIC PANEL
ALT: 42 U/L — ABNORMAL HIGH (ref 0–35)
AST: 24 U/L (ref 0–37)
Alkaline Phosphatase: 71 U/L (ref 39–117)
Sodium: 138 mEq/L (ref 135–145)
Total Bilirubin: 0.5 mg/dL (ref 0.3–1.2)
Total Protein: 7 g/dL (ref 6.0–8.3)

## 2011-06-26 MED ORDER — LISINOPRIL 10 MG PO TABS: 10.0000 mg | ORAL_TABLET | Freq: Every day | ORAL | Status: DC

## 2011-06-26 NOTE — Assessment & Plan Note (Signed)
On lipitor. Will recheck lipids and LFTs today.

## 2011-06-26 NOTE — Progress Notes (Signed)
Subjective:    Patient ID: Christie Ramirez, female    DOB: 01/26/1955, 56 y.o.   MRN: [id number]  HPI 56 year old female with history of hypertension and hyperlipidemia presents for followup. At her last visit, we initiated therapy with lisinopril. She has been taking the medication but has not been checking her blood pressure. She reports that she was out of town, caring for her father who is ill. She has not had any chest pain, shortness of breath, or headache. She generally reports she is feeling well. She denies any noted side effects of the medication.  Regards to her hyperlipidemia, she also reports full compliance with her Lipitor. She has not had liver function test or lipids repeated since initiating therapy. She denies any side effects from the medicine.  Outpatient Encounter Prescriptions as of 06/26/2011  Medication Sig Dispense Refill  . atorvastatin (LIPITOR) 40 MG tablet Take 1 tablet (40 mg total) by mouth daily.  90 tablet  2  . clonazePAM (KLONOPIN) 1 MG tablet Take 1 tablet (1 mg total) by mouth 3 (three) times daily as needed.  90 tablet  3  . escitalopram (LEXAPRO) 20 MG tablet Take 20 mg by mouth daily.       Marland Kitchen levothyroxine (SYNTHROID, LEVOTHROID) 175 MCG tablet Take 1 tablet (175 mcg total) by mouth daily.  30 tablet  6  . lisinopril (PRINIVIL,ZESTRIL) 10 MG tablet Take 1 tablet (10 mg total) by mouth daily.  30 tablet  3  . Multiple Vitamins-Minerals (MULTIVITAMIN WITH MINERALS) tablet Take 1 tablet by mouth daily.      Marland Kitchen zolpidem (AMBIEN) 10 MG tablet Take 1 tablet by mouth At bedtime.      Marland Kitchen DISCONTD: lisinopril (PRINIVIL,ZESTRIL) 5 MG tablet Take 1 tablet (5 mg total) by mouth daily.  30 tablet  3  . DISCONTD: silver sulfADIAZINE (SILVADENE) 1 % cream Apply topically daily.  50 g  0    Review of Systems  Constitutional: Negative for fever, chills, appetite change, fatigue and unexpected weight change.  HENT: Negative for ear pain, congestion, sore throat, trouble  swallowing, neck pain, voice change and sinus pressure.   Eyes: Negative for visual disturbance.  Respiratory: Negative for cough, shortness of breath, wheezing and stridor.   Cardiovascular: Negative for chest pain, palpitations and leg swelling.  Gastrointestinal: Negative for nausea, vomiting, abdominal pain, diarrhea, constipation, blood in stool, abdominal distention and anal bleeding.  Genitourinary: Negative for dysuria and flank pain.  Musculoskeletal: Negative for myalgias, arthralgias and gait problem.  Skin: Negative for color change and rash.  Neurological: Negative for dizziness and headaches.  Hematological: Negative for adenopathy. Does not bruise/bleed easily.  Psychiatric/Behavioral: Negative for suicidal ideas, sleep disturbance and dysphoric mood. The patient is not nervous/anxious.    BP 160/110  Pulse 76  Temp(Src) 97.6 F (36.4 C) (Oral)  Ht 5\' 1"  (1.549 m)  Wt 145 lb 4 oz (65.885 kg)  BMI 27.44 kg/m2     Objective:   Physical Exam  Constitutional: She is oriented to person, place, and time. She appears well-developed and well-nourished. No distress.  HENT:  Head: Normocephalic and atraumatic.  Right Ear: External ear normal.  Left Ear: External ear normal.  Nose: Nose normal.  Mouth/Throat: Oropharynx is clear and moist. No oropharyngeal exudate.  Eyes: Conjunctivae are normal. Pupils are equal, round, and reactive to light. Right eye exhibits no discharge. Left eye exhibits no discharge. No scleral icterus.  Neck: Normal range of motion. Neck supple. No tracheal deviation present.  No thyromegaly present.  Cardiovascular: Normal rate, regular rhythm, normal heart sounds and intact distal pulses.  Exam reveals no gallop and no friction rub.   No murmur heard. Pulmonary/Chest: Effort normal and breath sounds normal. No respiratory distress. She has no wheezes. She has no rales. She exhibits no tenderness.  Musculoskeletal: Normal range of motion. She exhibits  no edema and no tenderness.  Lymphadenopathy:    She has no cervical adenopathy.  Neurological: She is alert and oriented to person, place, and time. No cranial nerve deficit. She exhibits normal muscle tone. Coordination normal.  Skin: Skin is warm and dry. No rash noted. She is not diaphoretic. No erythema. No pallor.  Psychiatric: She has a normal mood and affect. Her behavior is normal. Judgment and thought content normal.          Assessment & Plan:

## 2011-06-26 NOTE — Assessment & Plan Note (Signed)
BP elevated. Will increase lisinopril to 10mg  daily. Repeat Cr and electrolytes today. Follow up 1 month. Pt will call if BP consistently >140/90.

## 2011-07-06 ENCOUNTER — Encounter: Payer: Self-pay | Admitting: Internal Medicine

## 2011-07-12 ENCOUNTER — Other Ambulatory Visit: Payer: Self-pay | Admitting: Internal Medicine

## 2011-07-12 MED ORDER — ZOLPIDEM TARTRATE 10 MG PO TABS: 10.0000 mg | ORAL_TABLET | Freq: Every evening | ORAL | Status: DC | PRN

## 2011-07-12 NOTE — Telephone Encounter (Signed)
Rx called to [facility].

## 2011-07-12 NOTE — Telephone Encounter (Signed)
Need refill [employer] [facility]

## 2011-07-14 ENCOUNTER — Other Ambulatory Visit: Payer: Self-pay | Admitting: Internal Medicine

## 2011-07-14 DIAGNOSIS — F419 Anxiety disorder, unspecified: Secondary | ICD-10-CM

## 2011-07-14 MED ORDER — CLONAZEPAM 1 MG PO TABS: 1.0000 mg | ORAL_TABLET | Freq: Three times a day (TID) | ORAL | Status: DC | PRN

## 2011-07-14 NOTE — Telephone Encounter (Signed)
Patient is going to run out of her clonazepam 1 mg this weekend, this was an over site on her part and she did apologize.  [facility] [address]  I also advised patient of the preferred method of calling the pharmacy and having them fax [place] however she told by pharmacy that it would be faster for her to call in.

## 2011-07-14 NOTE — Telephone Encounter (Signed)
[facility], patient advised as instructed via telephone.

## 2011-08-03 ENCOUNTER — Ambulatory Visit: Payer: BC Managed Care – PPO | Admitting: Internal Medicine

## 2011-08-14 ENCOUNTER — Other Ambulatory Visit: Payer: Self-pay | Admitting: Internal Medicine

## 2011-08-14 MED ORDER — ZOLPIDEM TARTRATE 10 MG PO TABS: 10.0000 mg | ORAL_TABLET | Freq: Every evening | ORAL | Status: DC | PRN

## 2011-08-14 NOTE — Telephone Encounter (Signed)
Pt came in needing refill on  Zolpidem tartrate 10 mg tablet   [facility] Pt completely out of meds

## 2011-08-14 NOTE — Telephone Encounter (Signed)
[facility], patient advised via telephone.

## 2011-08-31 ENCOUNTER — Ambulatory Visit: Payer: BC Managed Care – PPO | Admitting: Internal Medicine

## 2011-09-05 ENCOUNTER — Other Ambulatory Visit: Payer: Self-pay | Admitting: Internal Medicine

## 2011-09-05 DIAGNOSIS — F419 Anxiety disorder, unspecified: Secondary | ICD-10-CM

## 2011-09-05 MED ORDER — CLONAZEPAM 1 MG PO TABS: 1.0000 mg | ORAL_TABLET | Freq: Three times a day (TID) | ORAL | Status: DC | PRN

## 2011-09-05 MED ORDER — ZOLPIDEM TARTRATE 10 MG PO TABS: 10.0000 mg | ORAL_TABLET | Freq: Every evening | ORAL | Status: DC | PRN

## 2011-09-05 NOTE — Telephone Encounter (Signed)
Pt came in to get refills on the following rx Zolpidem tartrate 10 mg tab Clonazepam 1 mg  [facility]

## 2011-09-05 NOTE — Telephone Encounter (Signed)
[facility], patient advised via telephone.

## 2011-09-20 ENCOUNTER — Ambulatory Visit (INDEPENDENT_AMBULATORY_CARE_PROVIDER_SITE_OTHER): Payer: BC Managed Care – PPO | Admitting: Internal Medicine

## 2011-09-20 ENCOUNTER — Encounter: Payer: Self-pay | Admitting: Internal Medicine

## 2011-09-20 VITALS — BP 150/90 | HR 86 | Temp 98.7°F | Ht 61.0 in | Wt 145.8 lb

## 2011-09-20 DIAGNOSIS — I10 Essential (primary) hypertension: Secondary | ICD-10-CM

## 2011-09-20 MED ORDER — LISINOPRIL 10 MG PO TABS: 20.0000 mg | ORAL_TABLET | Freq: Every day | ORAL | Status: DC

## 2011-09-20 MED ORDER — LISINOPRIL 20 MG PO TABS: 20.0000 mg | ORAL_TABLET | Freq: Every day | ORAL | Status: DC

## 2011-09-20 MED ORDER — LOSARTAN POTASSIUM 50 MG PO TABS: 50.0000 mg | ORAL_TABLET | Freq: Every day | ORAL | Status: DC

## 2011-09-20 NOTE — Assessment & Plan Note (Addendum)
Blood pressure continues to be elevated. She also has dry cough which may be allergy to ACE inhibitor. Will change to losartan 50 mg daily. Patient will monitor blood pressure and call if consistently greater than 140/90. Followup one month. She will have repeat creatinine and potassium checked next week.

## 2011-09-20 NOTE — Progress Notes (Signed)
Subjective:    Patient ID: Christie Ramirez, female    DOB: 23-Feb-1955, 56 y.o.   MRN: [id number]  HPI 56 year old female with history of hypertension presents for followup. In the interim since her last visit, she reports that blood pressure has been elevated, typically greater than 140/90. She denies any chest pain, palpitations, headache. She reports full compliance with lisinopril. She notes intermittent dry cough. She questions whether this may be related to allergies.   Outpatient Encounter Prescriptions as of 09/20/2011  Medication Sig Dispense Refill  . atorvastatin (LIPITOR) 40 MG tablet Take 1 tablet (40 mg total) by mouth daily.  90 tablet  2  . clonazePAM (KLONOPIN) 1 MG tablet Take 1 tablet (1 mg total) by mouth 3 (three) times daily as needed.  90 tablet  3  . escitalopram (LEXAPRO) 20 MG tablet Take 20 mg by mouth daily.       Marland Kitchen levothyroxine (SYNTHROID, LEVOTHROID) 175 MCG tablet Take 1 tablet (175 mcg total) by mouth daily.  30 tablet  6  . Multiple Vitamins-Minerals (MULTIVITAMIN WITH MINERALS) tablet Take 1 tablet by mouth daily.      Marland Kitchen zolpidem (AMBIEN) 10 MG tablet Take 1 tablet (10 mg total) by mouth at bedtime as needed for sleep.  90 tablet  3  . losartan (COZAAR) 50 MG tablet Take 1 tablet (50 mg total) by mouth daily.  30 tablet  3   BP 150/90  Pulse 86  Temp 98.7 F (37.1 C) (Oral)  Ht 5\' 1"  (1.549 m)  Wt 145 lb 12 oz (66.112 kg)  BMI 27.54 kg/m2  SpO2 98%   Review of Systems  Constitutional: Negative for fever, chills, appetite change, fatigue and unexpected weight change.  HENT: Negative for ear pain, congestion, sore throat, trouble swallowing, neck pain, voice change and sinus pressure.   Eyes: Negative for visual disturbance.  Respiratory: Negative for cough, shortness of breath, wheezing and stridor.   Cardiovascular: Negative for chest pain, palpitations and leg swelling.  Gastrointestinal: Negative for nausea, vomiting, abdominal pain, diarrhea,  constipation, blood in stool, abdominal distention and anal bleeding.  Genitourinary: Negative for dysuria and flank pain.  Musculoskeletal: Negative for myalgias, arthralgias and gait problem.  Skin: Negative for color change and rash.  Neurological: Negative for dizziness and headaches.  Hematological: Negative for adenopathy. Does not bruise/bleed easily.  Psychiatric/Behavioral: Negative for suicidal ideas, disturbed wake/sleep cycle and dysphoric mood. The patient is not nervous/anxious.        Objective:   Physical Exam  Constitutional: She is oriented to person, place, and time. She appears well-developed and well-nourished. No distress.  HENT:  Head: Normocephalic and atraumatic.  Right Ear: External ear normal.  Left Ear: External ear normal.  Nose: Nose normal.  Mouth/Throat: Oropharynx is clear and moist. No oropharyngeal exudate.  Eyes: Conjunctivae are normal. Pupils are equal, round, and reactive to light. Right eye exhibits no discharge. Left eye exhibits no discharge. No scleral icterus.  Neck: Normal range of motion. Neck supple. No tracheal deviation present. No thyromegaly present.  Cardiovascular: Normal rate, regular rhythm, normal heart sounds and intact distal pulses.  Exam reveals no gallop and no friction rub.   No murmur heard. Pulmonary/Chest: Effort normal and breath sounds normal. No respiratory distress. She has no wheezes. She has no rales. She exhibits no tenderness.  Musculoskeletal: Normal range of motion. She exhibits no edema and no tenderness.  Lymphadenopathy:    She has no cervical adenopathy.  Neurological: She is alert and  oriented to person, place, and time. No cranial nerve deficit. She exhibits normal muscle tone. Coordination normal.  Skin: Skin is warm and dry. No rash noted. She is not diaphoretic. No erythema. No pallor.  Psychiatric: She has a normal mood and affect. Her behavior is normal. Judgment and thought content normal.           Assessment & Plan:

## 2011-09-27 ENCOUNTER — Other Ambulatory Visit: Payer: BC Managed Care – PPO

## 2011-10-25 ENCOUNTER — Ambulatory Visit: Payer: BC Managed Care – PPO | Admitting: Internal Medicine

## 2011-10-25 ENCOUNTER — Other Ambulatory Visit: Payer: Self-pay | Admitting: Internal Medicine

## 2011-10-25 DIAGNOSIS — F419 Anxiety disorder, unspecified: Secondary | ICD-10-CM

## 2011-10-25 MED ORDER — CLONAZEPAM 1 MG PO TABS: 1.0000 mg | ORAL_TABLET | Freq: Three times a day (TID) | ORAL | Status: DC | PRN

## 2011-10-25 NOTE — Telephone Encounter (Signed)
Pt came in today needing refill on  Clonazepam 1 mg  Take 1 table  By mouth [facility] is competely out of meds

## 2011-10-26 NOTE — Telephone Encounter (Signed)
Rx called to [facility].

## 2011-11-17 ENCOUNTER — Other Ambulatory Visit: Payer: BC Managed Care – PPO

## 2011-11-17 ENCOUNTER — Telehealth: Payer: Self-pay | Admitting: Internal Medicine

## 2011-11-17 NOTE — Telephone Encounter (Signed)
Caller: Laruen/Patient; Patient Name: Christie Ramirez; PCP: Ronna Polio (Adults only); Best Callback Phone Number: [phone]. Patient needs to reschedule her follow up vist that was for 11/30/11 at 11:30am. I have cancelled this appointment, but she needs to reschedule this. PLEASE CALL PATIENT TO RESCHEDULE THIS FOLLOW UP VISIT. Thanks.

## 2011-11-17 NOTE — Telephone Encounter (Signed)
I have called patient and we rescheduled her to 12/05/11 at 11:15.

## 2011-11-20 ENCOUNTER — Ambulatory Visit: Payer: BC Managed Care – PPO | Admitting: Internal Medicine

## 2011-11-23 ENCOUNTER — Other Ambulatory Visit: Payer: BC Managed Care – PPO

## 2011-11-27 ENCOUNTER — Other Ambulatory Visit: Payer: Self-pay | Admitting: Internal Medicine

## 2011-11-27 ENCOUNTER — Other Ambulatory Visit: Payer: BC Managed Care – PPO

## 2011-11-27 MED ORDER — ESCITALOPRAM OXALATE 20 MG PO TABS: 20.0000 mg | ORAL_TABLET | Freq: Every day | ORAL | Status: DC

## 2011-11-27 NOTE — Telephone Encounter (Signed)
Pt ieedign refill on Excitalopram 20 mg she uses [facility] on [address]

## 2011-11-27 NOTE — Telephone Encounter (Signed)
Rx sent to pharmacy, patient advised via message left on cell phone voicemail. 

## 2011-11-29 ENCOUNTER — Other Ambulatory Visit: Payer: Self-pay | Admitting: Internal Medicine

## 2011-11-30 ENCOUNTER — Ambulatory Visit: Payer: BC Managed Care – PPO | Admitting: Internal Medicine

## 2011-12-01 ENCOUNTER — Other Ambulatory Visit (INDEPENDENT_AMBULATORY_CARE_PROVIDER_SITE_OTHER): Payer: BC Managed Care – PPO

## 2011-12-01 DIAGNOSIS — E785 Hyperlipidemia, unspecified: Secondary | ICD-10-CM

## 2011-12-01 DIAGNOSIS — I10 Essential (primary) hypertension: Secondary | ICD-10-CM

## 2011-12-01 LAB — LIPID PANEL
Cholesterol: 208 mg/dL — ABNORMAL HIGH (ref 0–200)
HDL: 59.3 mg/dL (ref >39.00)

## 2011-12-01 LAB — COMPREHENSIVE METABOLIC PANEL
ALT: 25 U/L (ref 0–35)
Albumin: 4.1 g/dL (ref 3.5–5.2)
Alkaline Phosphatase: 68 U/L (ref 39–117)
BUN: 18 mg/dL (ref 6–23)
BUN: 19 mg/dL (ref 6–23)
CO2: 27 mEq/L (ref 19–32)
Calcium: 9.4 mg/dL (ref 8.4–10.5)
Calcium: 9.4 mg/dL (ref 8.4–10.5)
Chloride: 105 mEq/L (ref 96–112)
Creatinine, Ser: 0.8 mg/dL (ref 0.4–1.2)
Creatinine, Ser: 0.8 mg/dL (ref 0.4–1.2)
GFR: 82.65 mL/min (ref >60.00)
Glucose, Bld: 106 mg/dL — ABNORMAL HIGH (ref 70–99)
Glucose, Bld: 103 mg/dL — ABNORMAL HIGH (ref 70–99)
Potassium: 4.6 mEq/L (ref 3.5–5.1)
Total Bilirubin: 1.1 mg/dL (ref 0.3–1.2)

## 2011-12-01 LAB — MICROALBUMIN / CREATININE URINE RATIO
Creatinine,U: 46.2 mg/dL
Microalb Creat Ratio: 2.4 mg/g (ref 0.0–30.0)
Microalb, Ur: 1.1 mg/dL (ref 0.0–1.9)

## 2011-12-05 ENCOUNTER — Ambulatory Visit (INDEPENDENT_AMBULATORY_CARE_PROVIDER_SITE_OTHER): Payer: BC Managed Care – PPO | Admitting: Internal Medicine

## 2011-12-05 ENCOUNTER — Encounter: Payer: Self-pay | Admitting: Internal Medicine

## 2011-12-05 VITALS — BP 143/95 | HR 72 | Temp 97.7°F | Ht 65.0 in | Wt 146.0 lb

## 2011-12-05 DIAGNOSIS — F411 Generalized anxiety disorder: Secondary | ICD-10-CM

## 2011-12-05 DIAGNOSIS — F419 Anxiety disorder, unspecified: Secondary | ICD-10-CM

## 2011-12-05 DIAGNOSIS — I10 Essential (primary) hypertension: Secondary | ICD-10-CM

## 2011-12-05 DIAGNOSIS — E039 Hypothyroidism, unspecified: Secondary | ICD-10-CM

## 2011-12-05 DIAGNOSIS — E785 Hyperlipidemia, unspecified: Secondary | ICD-10-CM

## 2011-12-05 DIAGNOSIS — G4700 Insomnia, unspecified: Secondary | ICD-10-CM

## 2011-12-05 MED ORDER — ESCITALOPRAM OXALATE 20 MG PO TABS: 20.0000 mg | ORAL_TABLET | Freq: Every day | ORAL | Status: DC

## 2011-12-05 MED ORDER — LOSARTAN POTASSIUM 50 MG PO TABS: 50.0000 mg | ORAL_TABLET | Freq: Every day | ORAL | Status: DC

## 2011-12-05 MED ORDER — ZOLPIDEM TARTRATE 10 MG PO TABS: 10.0000 mg | ORAL_TABLET | Freq: Every evening | ORAL | Status: DC | PRN

## 2011-12-05 MED ORDER — LEVOTHYROXINE SODIUM 175 MCG PO TABS: 175.0000 ug | ORAL_TABLET | Freq: Every day | ORAL | Status: DC

## 2011-12-05 MED ORDER — ATORVASTATIN CALCIUM 40 MG PO TABS: 40.0000 mg | ORAL_TABLET | Freq: Every day | ORAL | Status: DC

## 2011-12-05 MED ORDER — CLONAZEPAM 1 MG PO TABS: 1.0000 mg | ORAL_TABLET | Freq: Three times a day (TID) | ORAL | Status: DC | PRN

## 2011-12-05 NOTE — Assessment & Plan Note (Signed)
Blood pressure slightly above goal. Patient will monitor blood pressure at home and if consistently greater than 140/90, will plan to increase dose of losartan to 100 mg daily. Follow up 6 months.

## 2011-12-05 NOTE — Progress Notes (Signed)
Subjective:    Patient ID: Christie Ramirez, female    DOB: 02/13/1955, 56 y.o.   MRN: [id number]  HPI 56 year old female with history of hypertension, hyperlipidemia, hypothyroidism presents for followup. She reports she has been doing well. She denies any new concerns today. She reports full compliance with her medication. She has not been regularly checking her blood pressure. She denies any headache, chest pain, palpitations.  Outpatient Encounter Prescriptions as of 12/05/2011  Medication Sig Dispense Refill  . atorvastatin (LIPITOR) 40 MG tablet Take 1 tablet (40 mg total) by mouth daily.  90 tablet  2  . clonazePAM (KLONOPIN) 1 MG tablet Take 1 tablet (1 mg total) by mouth 3 (three) times daily as needed.  90 tablet  1  . escitalopram (LEXAPRO) 20 MG tablet Take 1 tablet (20 mg total) by mouth daily.  90 tablet  3  . levothyroxine (SYNTHROID, LEVOTHROID) 175 MCG tablet Take 1 tablet (175 mcg total) by mouth daily.  90 tablet  4  . losartan (COZAAR) 50 MG tablet Take 1 tablet (50 mg total) by mouth daily.  30 tablet  3  . Multiple Vitamins-Minerals (MULTIVITAMIN WITH MINERALS) tablet Take 1 tablet by mouth daily.      Marland Kitchen zolpidem (AMBIEN) 10 MG tablet Take 1 tablet (10 mg total) by mouth at bedtime as needed for sleep.  90 tablet  3   BP 143/95  Pulse 72  Temp 97.7 F (36.5 C) (Oral)  Ht 5\' 5"  (1.651 m)  Wt 146 lb (66.225 kg)  BMI 24.30 kg/m2  Review of Systems  Constitutional: Negative for fever, chills, appetite change, fatigue and unexpected weight change.  HENT: Negative for ear pain, congestion, sore throat, trouble swallowing, neck pain, voice change and sinus pressure.   Eyes: Negative for visual disturbance.  Respiratory: Negative for cough, shortness of breath, wheezing and stridor.   Cardiovascular: Negative for chest pain, palpitations and leg swelling.  Gastrointestinal: Negative for nausea, vomiting, abdominal pain, diarrhea, constipation, blood in stool, abdominal  distention and anal bleeding.  Genitourinary: Negative for dysuria and flank pain.  Musculoskeletal: Negative for myalgias, arthralgias and gait problem.  Skin: Negative for color change and rash.  Neurological: Negative for dizziness and headaches.  Hematological: Negative for adenopathy. Does not bruise/bleed easily.  Psychiatric/Behavioral: Negative for suicidal ideas, sleep disturbance and dysphoric mood. The patient is not nervous/anxious.        Objective:   Physical Exam  Constitutional: She is oriented to person, place, and time. She appears well-developed and well-nourished. No distress.  HENT:  Head: Normocephalic and atraumatic.  Right Ear: External ear normal.  Left Ear: External ear normal.  Nose: Nose normal.  Mouth/Throat: Oropharynx is clear and moist. No oropharyngeal exudate.  Eyes: Conjunctivae normal are normal. Pupils are equal, round, and reactive to light. Right eye exhibits no discharge. Left eye exhibits no discharge. No scleral icterus.  Neck: Normal range of motion. Neck supple. No tracheal deviation present. No thyromegaly present.  Cardiovascular: Normal rate, regular rhythm, normal heart sounds and intact distal pulses.  Exam reveals no gallop and no friction rub.   No murmur heard. Pulmonary/Chest: Effort normal and breath sounds normal. No respiratory distress. She has no wheezes. She has no rales. She exhibits no tenderness.  Musculoskeletal: Normal range of motion. She exhibits no edema and no tenderness.  Lymphadenopathy:    She has no cervical adenopathy.  Neurological: She is alert and oriented to person, place, and time. No cranial nerve deficit. She exhibits  normal muscle tone. Coordination normal.  Skin: Skin is warm and dry. No rash noted. She is not diaphoretic. No erythema. No pallor.  Psychiatric: She has a normal mood and affect. Her behavior is normal. Judgment and thought content normal.          Assessment & Plan:

## 2011-12-05 NOTE — Assessment & Plan Note (Signed)
Recent cholesterol near goal of LDL less than 100. Will continue Lipitor. We discussed limiting intake of saturated fat and processed carbohydrates and increasing physical activity with goal of 30 minutes 5 days per week. Followup in 6 months or sooner as needed.

## 2012-01-06 ENCOUNTER — Other Ambulatory Visit: Payer: Self-pay | Admitting: Internal Medicine

## 2012-02-14 ENCOUNTER — Ambulatory Visit (INDEPENDENT_AMBULATORY_CARE_PROVIDER_SITE_OTHER): Payer: BC Managed Care – PPO | Admitting: Adult Health

## 2012-02-14 ENCOUNTER — Encounter: Payer: Self-pay | Admitting: Adult Health

## 2012-02-14 VITALS — BP 143/90 | HR 86 | Temp 97.5°F | Resp 16 | Ht 61.0 in | Wt 144.0 lb

## 2012-02-14 DIAGNOSIS — J4489 Other specified chronic obstructive pulmonary disease: Secondary | ICD-10-CM

## 2012-02-14 DIAGNOSIS — J111 Influenza due to unidentified influenza virus with other respiratory manifestations: Secondary | ICD-10-CM

## 2012-02-14 DIAGNOSIS — R6889 Other general symptoms and signs: Secondary | ICD-10-CM

## 2012-02-14 DIAGNOSIS — J449 Chronic obstructive pulmonary disease, unspecified: Secondary | ICD-10-CM | POA: Insufficient documentation

## 2012-02-14 LAB — POCT INFLUENZA A/B: Influenza A, POC: NEGATIVE

## 2012-02-14 MED ORDER — DOXYCYCLINE HYCLATE 100 MG PO TABS: 100.0000 mg | ORAL_TABLET | Freq: Two times a day (BID) | ORAL | Status: DC

## 2012-02-14 MED ORDER — ALBUTEROL SULFATE (2.5 MG/3ML) 0.083% IN NEBU
2.5000 mg | INHALATION_SOLUTION | Freq: Once | RESPIRATORY_TRACT | Status: AC
  Administered 2012-02-14: 2.5 mg via RESPIRATORY_TRACT

## 2012-02-14 MED ORDER — ALBUTEROL SULFATE HFA 108 (90 BASE) MCG/ACT IN AERS: 2.0000 | INHALATION_SPRAY | Freq: Four times a day (QID) | RESPIRATORY_TRACT | Status: DC | PRN

## 2012-02-14 NOTE — Progress Notes (Signed)
  Subjective:    Patient ID: Christie Ramirez, female    DOB: 1955/12/14, 57 y.o.   MRN: [id number]  HPI  Patient is a 57 y/o female who presents to clinic today with c/o HA, earache, fever > 100 with Ibuprofen on board. She has tried Mucinex and is trying to stay hydrated. She reports being around a colleague who was sick at work and thinks she may have caught it from her. Patient has not had the flu shot. She denies shortness of breath, chest pain.   Current Outpatient Prescriptions on File Prior to Visit  Medication Sig Dispense Refill  . atorvastatin (LIPITOR) 40 MG tablet Take 1 tablet (40 mg total) by mouth daily.  90 tablet  2  . clonazePAM (KLONOPIN) 1 MG tablet Take 1 tablet (1 mg total) by mouth 3 (three) times daily as needed.  90 tablet  1  . escitalopram (LEXAPRO) 20 MG tablet Take 1 tablet (20 mg total) by mouth daily.  90 tablet  3  . levothyroxine (SYNTHROID, LEVOTHROID) 175 MCG tablet Take 1 tablet (175 mcg total) by mouth daily.  90 tablet  4  . lisinopril (PRINIVIL,ZESTRIL) 10 MG tablet TAKE 1 TABLET (10 MG TOTAL) BY MOUTH DAILY.  30 tablet  3  . losartan (COZAAR) 50 MG tablet Take 1 tablet (50 mg total) by mouth daily.  30 tablet  3  . Multiple Vitamins-Minerals (MULTIVITAMIN WITH MINERALS) tablet Take 1 tablet by mouth daily.      Marland Kitchen zolpidem (AMBIEN) 10 MG tablet Take 1 tablet (10 mg total) by mouth at bedtime as needed for sleep.  90 tablet  3      Review of Systems  Constitutional: Positive for fever, chills and fatigue.  HENT: Positive for ear pain, congestion, sore throat and postnasal drip.   Eyes: Negative.   Respiratory: Positive for cough and wheezing.   Cardiovascular: Negative.   Gastrointestinal: Negative.   Genitourinary: Negative.   Musculoskeletal: Negative.   Skin: Negative.   Neurological: Positive for headaches. Negative for dizziness and light-headedness.  Psychiatric/Behavioral: Negative.     BP 143/90  Pulse 86  Temp 97.5 F (36.4 C)  (Oral)  Ht 5\' 1"  (1.549 m)  Wt 144 lb (65.318 kg)  BMI 27.21 kg/m2  SpO2 98%     Objective:   Physical Exam  Constitutional: She appears well-developed and well-nourished.  HENT:  Head: Normocephalic and atraumatic.  Left Ear: External ear normal.  Nose: Nose normal.       Right ear with erythematous canal. TM also noted with area of erythema. Pharyngeal erythema.  Neck: Normal range of motion. No tracheal deviation present.  Cardiovascular: Normal rate, regular rhythm and normal heart sounds.  Exam reveals no gallop.   No murmur heard. Pulmonary/Chest: Effort normal. She has wheezes. She exhibits no tenderness.       Coarse rhonchi that does not clear with coughing  Abdominal: Soft. Bowel sounds are normal.  Musculoskeletal: Normal range of motion. She exhibits no edema.  Lymphadenopathy:    She has no cervical adenopathy.  Skin: Skin is warm and dry.  Psychiatric: She has a normal mood and affect. Her behavior is normal. Judgment and thought content normal.       Assessment & Plan:

## 2012-02-14 NOTE — Patient Instructions (Addendum)
  The swab for influenza was negative.  Please have a nebulizer tx prior to leaving the office today.  Start your antibiotic today.  You can take Delsym or Robitussin for cough. Make sure you buy the one that has DM in the name.  If you do not feel you are improving by Friday, please call and we may send you for a chest xray.

## 2012-02-14 NOTE — Assessment & Plan Note (Signed)
Pulmonary symptoms of wheezing and rhonchi so will start Doxycycline and albuterol inhaler. Nebulizer tx with albuterol given while in clinic. Push fluids to maintain hydration. RTC if no signs of improvement by Friday and will send for chest xray.

## 2012-02-19 ENCOUNTER — Encounter: Payer: Self-pay | Admitting: Adult Health

## 2012-02-19 ENCOUNTER — Telehealth: Payer: Self-pay | Admitting: Internal Medicine

## 2012-02-19 NOTE — Telephone Encounter (Signed)
Pt is needing a note for this past Tuesday until this Sunday for being out because of pnuemonia. Pt will come back to pick this tomorrow.

## 2012-02-19 NOTE — Telephone Encounter (Signed)
Is this okay?

## 2012-03-04 ENCOUNTER — Other Ambulatory Visit: Payer: Self-pay | Admitting: *Deleted

## 2012-03-04 DIAGNOSIS — E039 Hypothyroidism, unspecified: Secondary | ICD-10-CM

## 2012-03-04 DIAGNOSIS — F419 Anxiety disorder, unspecified: Secondary | ICD-10-CM

## 2012-03-04 MED ORDER — CLONAZEPAM 1 MG PO TABS: 1.0000 mg | ORAL_TABLET | Freq: Three times a day (TID) | ORAL | Status: DC | PRN

## 2012-03-04 NOTE — Telephone Encounter (Signed)
Received faxed refill request. Last office visit 02/14/12 (acute). Is it okay to refill medication?

## 2012-03-04 NOTE — Telephone Encounter (Signed)
Rx called to pharmacy

## 2012-03-12 ENCOUNTER — Other Ambulatory Visit: Payer: Self-pay | Admitting: *Deleted

## 2012-03-12 NOTE — Telephone Encounter (Signed)
error 

## 2012-04-03 ENCOUNTER — Other Ambulatory Visit: Payer: Self-pay | Admitting: Internal Medicine

## 2012-06-07 ENCOUNTER — Encounter: Payer: BC Managed Care – PPO | Admitting: Internal Medicine

## 2012-06-11 ENCOUNTER — Other Ambulatory Visit: Payer: Self-pay | Admitting: Internal Medicine

## 2012-06-11 NOTE — Telephone Encounter (Signed)
Ok to refill 

## 2012-06-12 NOTE — Telephone Encounter (Signed)
Rx faxed to pharmacy  

## 2012-08-16 ENCOUNTER — Ambulatory Visit (INDEPENDENT_AMBULATORY_CARE_PROVIDER_SITE_OTHER): Payer: BC Managed Care – PPO | Admitting: Internal Medicine

## 2012-08-16 ENCOUNTER — Encounter: Payer: Self-pay | Admitting: Internal Medicine

## 2012-08-16 VITALS — BP 140/90 | HR 108 | Temp 98.2°F

## 2012-08-16 DIAGNOSIS — S99911A Unspecified injury of right ankle, initial encounter: Secondary | ICD-10-CM

## 2012-08-16 DIAGNOSIS — S99929A Unspecified injury of unspecified foot, initial encounter: Secondary | ICD-10-CM

## 2012-08-16 DIAGNOSIS — S99919A Unspecified injury of unspecified ankle, initial encounter: Secondary | ICD-10-CM | POA: Insufficient documentation

## 2012-08-16 DIAGNOSIS — S8990XA Unspecified injury of unspecified lower leg, initial encounter: Secondary | ICD-10-CM

## 2012-08-16 MED ORDER — OXYCODONE-ACETAMINOPHEN 5-325 MG PO TABS: 1.0000 | ORAL_TABLET | Freq: Three times a day (TID) | ORAL | Status: DC | PRN

## 2012-08-16 MED ORDER — CLONAZEPAM 1 MG PO TABS: ORAL_TABLET | ORAL | Status: DC

## 2012-08-16 NOTE — Assessment & Plan Note (Signed)
Exam concerning for fracture. Will set up ortho evaluation with imaging today. Encouraged pt to limit use of NSAIDS given recent abdominal discomfort. Will use percocet prn severe pain. Follow up prn.

## 2012-08-16 NOTE — Progress Notes (Signed)
Subjective:    Patient ID: Christie Ramirez, female    DOB: April 02, 1955, 57 y.o.   MRN: [id number]  HPI 57 year old female presents for acute visit complaining of severe left ankle pain after slipping and falling from standing position this morning. She is unsure how she landed on her ankle. She reports pain on both sides of her ankle and inability to bear any weight on her ankle. Her ankle has been swollen. She has taken approximately 3200 mg of ibuprofen over the last 12 hours with minimal improvement in pain. She has also been icing her ankle continuously with no improvement.  Outpatient Encounter Prescriptions as of 08/16/2012  Medication Sig Dispense Refill  . atorvastatin (LIPITOR) 40 MG tablet Take 1 tablet (40 mg total) by mouth daily.  90 tablet  2  . clonazePAM (KLONOPIN) 1 MG tablet TAKE 1 TABLET BY MOUTH 3 TIMES A DAY AS NEEDED  90 tablet  0  . escitalopram (LEXAPRO) 20 MG tablet Take 1 tablet (20 mg total) by mouth daily.  90 tablet  3  . levothyroxine (SYNTHROID, LEVOTHROID) 175 MCG tablet Take 1 tablet (175 mcg total) by mouth daily.  90 tablet  4  . losartan (COZAAR) 50 MG tablet TAKE 1 TABLET (50 MG TOTAL) BY MOUTH DAILY.  30 tablet  0  . Multiple Vitamins-Minerals (MULTIVITAMIN WITH MINERALS) tablet Take 1 tablet by mouth daily.      Marland Kitchen zolpidem (AMBIEN) 10 MG tablet TAKE 1 TABLET BY MOUTH AT BEDTIME AS NEEDED FOR SLEEP  90 tablet  1  . [DISCONTINUED] clonazePAM (KLONOPIN) 1 MG tablet TAKE 1 TABLET BY MOUTH 3 TIMES A DAY AS NEEDED  90 tablet  0  . albuterol (PROVENTIL HFA;VENTOLIN HFA) 108 (90 BASE) MCG/ACT inhaler Inhale 2 puffs into the lungs every 6 (six) hours as needed for wheezing.  1 Inhaler  0  . lisinopril (PRINIVIL,ZESTRIL) 10 MG tablet TAKE 1 TABLET (10 MG TOTAL) BY MOUTH DAILY.  30 tablet  3  . oxyCODONE-acetaminophen (ROXICET) 5-325 MG per tablet Take 1-2 tablets by mouth every 8 (eight) hours as needed for pain.  60 tablet  0  . [DISCONTINUED] doxycycline (VIBRA-TABS)  100 MG tablet Take 1 tablet (100 mg total) by mouth 2 (two) times daily.  14 tablet  0   No facility-administered encounter medications on file as of 08/16/2012.   BP 140/90  Pulse 108  Temp(Src) 98.2 F (36.8 C) (Oral)  SpO2 94%  Review of Systems  Constitutional: Negative for fever, chills, appetite change, fatigue and unexpected weight change.  HENT: Negative for neck pain.   Eyes: Negative for visual disturbance.  Respiratory: Negative for shortness of breath.   Cardiovascular: Positive for leg swelling. Negative for chest pain.  Genitourinary: Negative for dysuria and flank pain.  Musculoskeletal: Positive for myalgias, joint swelling and arthralgias. Negative for gait problem.  Skin: Positive for color change. Negative for rash.  Neurological: Negative for dizziness and headaches.  Hematological: Negative for adenopathy. Does not bruise/bleed easily.  Psychiatric/Behavioral: Positive for dysphoric mood. Negative for suicidal ideas and sleep disturbance. The patient is not nervous/anxious.        Objective:   Physical Exam  Constitutional: She is oriented to person, place, and time. She appears well-developed and well-nourished. No distress.  HENT:  Head: Normocephalic and atraumatic.  Right Ear: External ear normal.  Left Ear: External ear normal.  Nose: Nose normal.  Mouth/Throat: Oropharynx is clear and moist. No oropharyngeal exudate.  Eyes: Conjunctivae are normal. Pupils  are equal, round, and reactive to light. Right eye exhibits no discharge. Left eye exhibits no discharge. No scleral icterus.  Neck: Normal range of motion. Neck supple. No tracheal deviation present. No thyromegaly present.  Pulmonary/Chest: Effort normal. No accessory muscle usage. Not tachypneic. She has no decreased breath sounds. She has no rhonchi.  Musculoskeletal: She exhibits no edema and no tenderness.       Left ankle: She exhibits decreased range of motion and swelling. Tenderness. Lateral  malleolus and medial malleolus tenderness found.  Lymphadenopathy:    She has no cervical adenopathy.  Neurological: She is alert and oriented to person, place, and time. No cranial nerve deficit. She exhibits normal muscle tone. Coordination normal.  Skin: Skin is warm and dry. No rash noted. She is not diaphoretic. No erythema. No pallor.  Psychiatric: She has a normal mood and affect. Her behavior is normal. Judgment and thought content normal.          Assessment & Plan:

## 2012-08-20 ENCOUNTER — Other Ambulatory Visit: Payer: Self-pay | Admitting: Internal Medicine

## 2012-09-18 ENCOUNTER — Ambulatory Visit (INDEPENDENT_AMBULATORY_CARE_PROVIDER_SITE_OTHER): Payer: BC Managed Care – PPO | Admitting: Internal Medicine

## 2012-09-18 ENCOUNTER — Encounter: Payer: Self-pay | Admitting: Internal Medicine

## 2012-09-18 VITALS — BP 152/100 | HR 82 | Temp 97.8°F | Wt 145.0 lb

## 2012-09-18 DIAGNOSIS — F419 Anxiety disorder, unspecified: Secondary | ICD-10-CM

## 2012-09-18 DIAGNOSIS — R5383 Other fatigue: Secondary | ICD-10-CM

## 2012-09-18 DIAGNOSIS — I10 Essential (primary) hypertension: Secondary | ICD-10-CM

## 2012-09-18 DIAGNOSIS — F411 Generalized anxiety disorder: Secondary | ICD-10-CM

## 2012-09-18 DIAGNOSIS — E039 Hypothyroidism, unspecified: Secondary | ICD-10-CM

## 2012-09-18 DIAGNOSIS — R5381 Other malaise: Secondary | ICD-10-CM

## 2012-09-18 MED ORDER — LOSARTAN POTASSIUM 100 MG PO TABS: ORAL_TABLET | ORAL | Status: DC

## 2012-09-18 NOTE — Progress Notes (Signed)
Subjective:    Patient ID: Christie Ramirez, female    DOB: 10-27-1955, 57 y.o.   MRN: [id number]  HPI 57 year old female with history of anxiety, depression, hypertension presents for acute visit. She reports recent significant worsening of symptoms of anxiety and depressed mood. This occurred with the recent death of her father and ongoing family issues related to his estate and burial. She also reports significant anxiety related to work. In addition, she recently tried to cut back on alcohol use from 3 glasses of wine per night to abstinence. With these changes, she reports symptoms of headache and occasional numbness in her hands. She had a friend check her blood pressure yesterday and it was greater than 180/100. She denies any chest pain or palpitations. She reports compliance with her blood pressure medications.  Outpatient Prescriptions Prior to Visit  Medication Sig Dispense Refill  . atorvastatin (LIPITOR) 40 MG tablet Take 1 tablet (40 mg total) by mouth daily.  90 tablet  2  . clonazePAM (KLONOPIN) 1 MG tablet TAKE 1 TABLET BY MOUTH 3 TIMES A DAY AS NEEDED  90 tablet  0  . escitalopram (LEXAPRO) 20 MG tablet Take 1 tablet (20 mg total) by mouth daily.  90 tablet  3  . levothyroxine (SYNTHROID, LEVOTHROID) 175 MCG tablet Take 1 tablet (175 mcg total) by mouth daily.  90 tablet  4  . Multiple Vitamins-Minerals (MULTIVITAMIN WITH MINERALS) tablet Take 1 tablet by mouth daily.      Marland Kitchen zolpidem (AMBIEN) 10 MG tablet TAKE 1 TABLET BY MOUTH AT BEDTIME AS NEEDED FOR SLEEP  90 tablet  1  . losartan (COZAAR) 50 MG tablet TAKE 1 TABLET DAILY.  30 tablet  0  . albuterol (PROVENTIL HFA;VENTOLIN HFA) 108 (90 BASE) MCG/ACT inhaler Inhale 2 puffs into the lungs every 6 (six) hours as needed for wheezing.  1 Inhaler  0  . oxyCODONE-acetaminophen (ROXICET) 5-325 MG per tablet Take 1-2 tablets by mouth every 8 (eight) hours as needed for pain.  60 tablet  0   No facility-administered medications prior to  visit.   BP 152/100  Pulse 82  Temp(Src) 97.8 F (36.6 C) (Oral)  Wt 145 lb (65.772 kg)  BMI 27.41 kg/m2  SpO2 95%  Review of Systems  Constitutional: Positive for fatigue. Negative for fever, chills, appetite change and unexpected weight change.  HENT: Negative for ear pain, congestion, sore throat, trouble swallowing, neck pain, voice change and sinus pressure.   Eyes: Negative for visual disturbance.  Respiratory: Negative for cough, shortness of breath, wheezing and stridor.   Cardiovascular: Negative for chest pain, palpitations and leg swelling.  Gastrointestinal: Negative for nausea, vomiting, abdominal pain, diarrhea, constipation, blood in stool, abdominal distention and anal bleeding.  Genitourinary: Negative for dysuria and flank pain.  Musculoskeletal: Negative for myalgias, arthralgias and gait problem.  Skin: Negative for color change and rash.  Neurological: Positive for numbness and headaches. Negative for dizziness.  Hematological: Negative for adenopathy. Does not bruise/bleed easily.  Psychiatric/Behavioral: Positive for sleep disturbance and dysphoric mood. Negative for suicidal ideas. The patient is nervous/anxious.        Objective:   Physical Exam  Constitutional: She is oriented to person, place, and time. She appears well-developed and well-nourished. No distress.  HENT:  Head: Normocephalic and atraumatic.  Right Ear: External ear normal.  Left Ear: External ear normal.  Nose: Nose normal.  Mouth/Throat: Oropharynx is clear and moist. No oropharyngeal exudate.  Eyes: Conjunctivae are normal. Pupils are equal, round,  and reactive to light. Right eye exhibits no discharge. Left eye exhibits no discharge. No scleral icterus.  Neck: Normal range of motion. Neck supple. No tracheal deviation present. No thyromegaly present.  Cardiovascular: Normal rate, regular rhythm, normal heart sounds and intact distal pulses.  Exam reveals no gallop and no friction rub.    No murmur heard. Pulmonary/Chest: Effort normal and breath sounds normal. No accessory muscle usage. Not tachypneic. No respiratory distress. She has no decreased breath sounds. She has no wheezes. She has no rhonchi. She has no rales. She exhibits no tenderness.  Musculoskeletal: Normal range of motion. She exhibits no edema and no tenderness.  Lymphadenopathy:    She has no cervical adenopathy.  Neurological: She is alert and oriented to person, place, and time. No cranial nerve deficit. She exhibits normal muscle tone. Coordination normal.  Skin: Skin is warm and dry. No rash noted. She is not diaphoretic. No erythema. No pallor.  Psychiatric: She has a normal mood and affect. Her behavior is normal. Judgment and thought content normal.          Assessment & Plan:

## 2012-09-18 NOTE — Assessment & Plan Note (Signed)
  BP Readings from Last 3 Encounters:  09/18/12 152/100  08/16/12 140/90  02/14/12 143/90   BP markedly elevated. Suspect recent increased BP related to increased stress with death of her father and ongoing work issues. Will increase losartan to 100mg  daily. Will check renal function with labs today. Will also check TSH with labs.

## 2012-09-18 NOTE — Assessment & Plan Note (Addendum)
Increased anxiety and depressed mood with stressors at work and family issues after father's death. Encouraged her to pursue counseling again with [facility]. Continue Lexapro and Clonazepam prn. Follow up 2 weeks and prn.

## 2012-09-19 ENCOUNTER — Encounter: Payer: Self-pay | Admitting: *Deleted

## 2012-09-19 LAB — CBC WITH DIFFERENTIAL/PLATELET
Eosinophils Absolute: 0.1 10*3/uL (ref 0.0–0.7)
Lymphs Abs: 2.3 10*3/uL (ref 0.7–4.0)
MCHC: 33.3 g/dL (ref 30.0–36.0)
MCV: 88.6 fl (ref 78.0–100.0)
Monocytes Absolute: 0.7 10*3/uL (ref 0.1–1.0)
Neutrophils Relative %: 59.2 % (ref 43.0–77.0)
Platelets: 278 10*3/uL (ref 150.0–400.0)

## 2012-09-19 LAB — MICROALBUMIN / CREATININE URINE RATIO
Creatinine,U: 56.5 mg/dL
Microalb Creat Ratio: 0.4 mg/g (ref 0.0–30.0)

## 2012-09-20 ENCOUNTER — Ambulatory Visit (INDEPENDENT_AMBULATORY_CARE_PROVIDER_SITE_OTHER): Payer: BC Managed Care – PPO | Admitting: *Deleted

## 2012-09-20 ENCOUNTER — Telehealth: Payer: Self-pay | Admitting: *Deleted

## 2012-09-20 VITALS — BP 141/92

## 2012-09-20 DIAGNOSIS — I10 Essential (primary) hypertension: Secondary | ICD-10-CM

## 2012-09-20 LAB — COMPREHENSIVE METABOLIC PANEL
ALT: 69 U/L — ABNORMAL HIGH (ref 0–35)
AST: 34 U/L (ref 0–37)
BUN: 13 mg/dL (ref 6–23)
CO2: 23 mEq/L (ref 19–32)
Calcium: 10.1 mg/dL (ref 8.4–10.5)
Chloride: 106 mEq/L (ref 96–112)
Creatinine, Ser: 0.6 mg/dL (ref 0.4–1.2)
GFR: 112.05 mL/min (ref >60.00)
Total Bilirubin: 0.9 mg/dL (ref 0.3–1.2)

## 2012-09-20 LAB — VITAMIN B12: Vitamin B-12: 418 pg/mL (ref 211–911)

## 2012-09-20 LAB — TSH: TSH: 0.06 u[IU]/mL — ABNORMAL LOW (ref 0.35–5.50)

## 2012-09-20 MED ORDER — AMLODIPINE BESYLATE 5 MG PO TABS: 5.0000 mg | ORAL_TABLET | Freq: Every day | ORAL | Status: DC

## 2012-09-20 NOTE — Progress Notes (Signed)
Blood pressure continues to be elevated. I would like to add Amlodipine 5mg  daily, #30 and have her follow up next week.

## 2012-09-20 NOTE — Telephone Encounter (Signed)
Patient came in for BP check, it was 141/92

## 2012-09-25 ENCOUNTER — Telehealth: Payer: Self-pay | Admitting: *Deleted

## 2012-09-25 NOTE — Telephone Encounter (Signed)
Pt called and was notified of her lab results

## 2012-09-26 NOTE — Telephone Encounter (Signed)
Noted and thank you

## 2012-10-01 ENCOUNTER — Telehealth: Payer: Self-pay | Admitting: Internal Medicine

## 2012-10-01 NOTE — Telephone Encounter (Signed)
Please refer to lab note, called patient about labs and she was already informed last week.

## 2012-10-01 NOTE — Telephone Encounter (Signed)
Pt states she is returning someone's call, could not catch name of caller on msg.  No notes in chart.  Pt states she received the reminder call for appt 9/1 @ 1p.m.  Please contact pt on cell.

## 2012-10-02 ENCOUNTER — Other Ambulatory Visit: Payer: Self-pay | Admitting: Internal Medicine

## 2012-10-02 ENCOUNTER — Encounter: Payer: Self-pay | Admitting: *Deleted

## 2012-10-03 ENCOUNTER — Encounter: Payer: Self-pay | Admitting: Internal Medicine

## 2012-10-03 ENCOUNTER — Ambulatory Visit (INDEPENDENT_AMBULATORY_CARE_PROVIDER_SITE_OTHER): Payer: BC Managed Care – PPO | Admitting: Internal Medicine

## 2012-10-03 VITALS — BP 128/82 | HR 95 | Temp 98.3°F | Wt 143.0 lb

## 2012-10-03 DIAGNOSIS — I10 Essential (primary) hypertension: Secondary | ICD-10-CM

## 2012-10-03 DIAGNOSIS — E039 Hypothyroidism, unspecified: Secondary | ICD-10-CM

## 2012-10-03 MED ORDER — LEVOTHYROXINE SODIUM 150 MCG PO TABS: 150.0000 ug | ORAL_TABLET | Freq: Every day | ORAL | Status: DC

## 2012-10-03 MED ORDER — CLONAZEPAM 1 MG PO TABS: ORAL_TABLET | ORAL | Status: DC

## 2012-10-03 NOTE — Assessment & Plan Note (Signed)
Recent TSH was over suppressed. Dose of levothyroxine was decreased to 150 mcg daily. Will plan to repeat TSH and free T4 in 6 weeks.

## 2012-10-03 NOTE — Assessment & Plan Note (Signed)
BP Readings from Last 3 Encounters:  10/03/12 128/82  09/20/12 141/92  09/18/12 152/100   Blood pressure much better controlled on current medications. Will continue. Followup in 6 weeks.

## 2012-10-03 NOTE — Progress Notes (Signed)
Subjective:    Patient ID: Christie Ramirez, female    DOB: 1955/03/31, 57 y.o.   MRN: [id number]  HPI 57 year old female with history of hypertension and hypothyroidism presents for followup. After her last visit, she was started on amlodipine to better control blood pressure. She reports blood pressure has been well-controlled, typically less than 140/90. She denies any recent chest pain or palpitations. She does continue to have intermittent mild headaches.  She was also noted to have over suppressed TSH and recent labs in dose of levothyroxine was decreased to 150 mcg daily. Over the last several months she did notice occasional episodes of temperature intolerance and palpitations.  Outpatient Encounter Prescriptions as of 10/03/2012  Medication Sig Dispense Refill  . albuterol (PROVENTIL HFA;VENTOLIN HFA) 108 (90 BASE) MCG/ACT inhaler Inhale 2 puffs into the lungs every 6 (six) hours as needed for wheezing.  1 Inhaler  0  . amLODipine (NORVASC) 5 MG tablet Take 1 tablet (5 mg total) by mouth daily.  30 tablet  3  . atorvastatin (LIPITOR) 40 MG tablet Take 1 tablet (40 mg total) by mouth daily.  90 tablet  2  . clonazePAM (KLONOPIN) 1 MG tablet TAKE 1 TABLET BY MOUTH 3 TIMES A DAY AS NEEDED  90 tablet  0  . escitalopram (LEXAPRO) 20 MG tablet Take 1 tablet (20 mg total) by mouth daily.  90 tablet  3  . levothyroxine (SYNTHROID, LEVOTHROID) 150 MCG tablet Take 1 tablet (150 mcg total) by mouth daily.  90 tablet  4  . losartan (COZAAR) 100 MG tablet TAKE 1 TABLET DAILY.  30 tablet  3  . Multiple Vitamins-Minerals (MULTIVITAMIN WITH MINERALS) tablet Take 1 tablet by mouth daily.      Marland Kitchen oxyCODONE-acetaminophen (ROXICET) 5-325 MG per tablet Take 1-2 tablets by mouth every 8 (eight) hours as needed for pain.  60 tablet  0  . zolpidem (AMBIEN) 10 MG tablet TAKE 1 TABLET BY MOUTH AT BEDTIME AS NEEDED FOR SLEEP  90 tablet  1  . [DISCONTINUED] clonazePAM (KLONOPIN) 1 MG tablet TAKE 1 TABLET BY MOUTH 3  TIMES A DAY AS NEEDED  90 tablet  0  . [DISCONTINUED] levothyroxine (SYNTHROID, LEVOTHROID) 175 MCG tablet Take 1 tablet (175 mcg total) by mouth daily.  90 tablet  4   No facility-administered encounter medications on file as of 10/03/2012.   BP 128/82  Pulse 95  Temp(Src) 98.3 F (36.8 C) (Oral)  Wt 143 lb (64.864 kg)  BMI 27.03 kg/m2  SpO2 97%  Review of Systems  Constitutional: Negative for fever, chills, appetite change, fatigue and unexpected weight change.  HENT: Negative for ear pain, congestion, sore throat, trouble swallowing, neck pain, voice change and sinus pressure.   Eyes: Negative for visual disturbance.  Respiratory: Negative for cough, shortness of breath, wheezing and stridor.   Cardiovascular: Negative for chest pain, palpitations and leg swelling.  Gastrointestinal: Negative for nausea, vomiting, abdominal pain, diarrhea, constipation, blood in stool, abdominal distention and anal bleeding.  Genitourinary: Negative for dysuria and flank pain.  Musculoskeletal: Negative for myalgias, arthralgias and gait problem.  Skin: Negative for color change and rash.  Neurological: Negative for dizziness and headaches.  Hematological: Negative for adenopathy. Does not bruise/bleed easily.  Psychiatric/Behavioral: Negative for suicidal ideas, sleep disturbance and dysphoric mood. The patient is not nervous/anxious.        Objective:   Physical Exam  Constitutional: She is oriented to person, place, and time. She appears well-developed and well-nourished. No distress.  HENT:  Head: Normocephalic and atraumatic.  Right Ear: External ear normal.  Left Ear: External ear normal.  Nose: Nose normal.  Mouth/Throat: Oropharynx is clear and moist.  Eyes: Conjunctivae are normal. Pupils are equal, round, and reactive to light. Right eye exhibits no discharge. Left eye exhibits no discharge. No scleral icterus.  Neck: Normal range of motion. Neck supple. No tracheal deviation  present. No thyromegaly present.  Cardiovascular: Normal rate, regular rhythm, normal heart sounds and intact distal pulses.  Exam reveals no gallop and no friction rub.   No murmur heard. Pulmonary/Chest: Effort normal and breath sounds normal. No accessory muscle usage. Not tachypneic. No respiratory distress. She has no decreased breath sounds. She has no wheezes. She has no rhonchi. She has no rales. She exhibits no tenderness.  Musculoskeletal: Normal range of motion. She exhibits no edema and no tenderness.  Lymphadenopathy:    She has no cervical adenopathy.  Neurological: She is alert and oriented to person, place, and time. No cranial nerve deficit. She exhibits normal muscle tone. Coordination normal.  Skin: Skin is warm and dry. No rash noted. She is not diaphoretic. No erythema. No pallor.  Psychiatric: She has a normal mood and affect. Her behavior is normal. Judgment and thought content normal.          Assessment & Plan:

## 2012-10-12 ENCOUNTER — Other Ambulatory Visit: Payer: Self-pay | Admitting: Internal Medicine

## 2012-10-28 ENCOUNTER — Other Ambulatory Visit: Payer: Self-pay | Admitting: Internal Medicine

## 2012-11-15 ENCOUNTER — Other Ambulatory Visit (INDEPENDENT_AMBULATORY_CARE_PROVIDER_SITE_OTHER): Payer: BC Managed Care – PPO

## 2012-11-15 DIAGNOSIS — I10 Essential (primary) hypertension: Secondary | ICD-10-CM

## 2012-11-15 DIAGNOSIS — E039 Hypothyroidism, unspecified: Secondary | ICD-10-CM

## 2012-11-15 LAB — COMPREHENSIVE METABOLIC PANEL
ALT: 84 U/L — ABNORMAL HIGH (ref 0–35)
AST: 54 U/L — ABNORMAL HIGH (ref 0–37)
CO2: 28 mEq/L (ref 19–32)
Creatinine, Ser: 0.7 mg/dL (ref 0.4–1.2)
GFR: 86.23 mL/min (ref >60.00)
Glucose, Bld: 108 mg/dL — ABNORMAL HIGH (ref 70–99)
Sodium: 140 mEq/L (ref 135–145)
Total Bilirubin: 0.7 mg/dL (ref 0.3–1.2)
Total Protein: 6.9 g/dL (ref 6.0–8.3)

## 2012-11-15 LAB — TSH: TSH: 1.74 u[IU]/mL (ref 0.35–5.50)

## 2012-11-18 ENCOUNTER — Encounter (INDEPENDENT_AMBULATORY_CARE_PROVIDER_SITE_OTHER): Payer: Self-pay

## 2012-11-18 ENCOUNTER — Ambulatory Visit (INDEPENDENT_AMBULATORY_CARE_PROVIDER_SITE_OTHER): Payer: BC Managed Care – PPO | Admitting: Internal Medicine

## 2012-11-18 ENCOUNTER — Encounter: Payer: Self-pay | Admitting: Internal Medicine

## 2012-11-18 VITALS — BP 120/80 | HR 102 | Temp 98.3°F | Ht 61.0 in | Wt 153.5 lb

## 2012-11-18 DIAGNOSIS — F32A Depression, unspecified: Secondary | ICD-10-CM

## 2012-11-18 DIAGNOSIS — E039 Hypothyroidism, unspecified: Secondary | ICD-10-CM

## 2012-11-18 DIAGNOSIS — F3289 Other specified depressive episodes: Secondary | ICD-10-CM

## 2012-11-18 DIAGNOSIS — F329 Major depressive disorder, single episode, unspecified: Secondary | ICD-10-CM

## 2012-11-18 DIAGNOSIS — Z1239 Encounter for other screening for malignant neoplasm of breast: Secondary | ICD-10-CM

## 2012-11-18 DIAGNOSIS — I10 Essential (primary) hypertension: Secondary | ICD-10-CM

## 2012-11-18 DIAGNOSIS — R7989 Other specified abnormal findings of blood chemistry: Secondary | ICD-10-CM | POA: Insufficient documentation

## 2012-11-18 LAB — HM PAP SMEAR

## 2012-11-18 NOTE — Assessment & Plan Note (Signed)
Recent labs showed mildly elevated LFTs. Suspect related to fatty liver. Will plan to recheck LFTS in 4 weeks. If persistently elevated, will get liver US.

## 2012-11-18 NOTE — Progress Notes (Signed)
Subjective:    Patient ID: Christie Ramirez, female    DOB: [birth year], 56 y.o.   MRN: [id number]  HPI 57YO female with h/o hypertension, hyperlipidemia, depression, hypothyroidism presents for follow up. Generally feeling well. Recent labs showed thyroid function normalized on Levothyroxine daily. Pt reports compliance with medications. No recent headaches, chest pain, palpitations. Symptoms of depression/anxiety have been well controlled with Clonazepam and Lexapro. No new concerns today.  Outpatient Encounter Prescriptions as of 11/18/2012  Medication Sig Dispense Refill  . amLODipine (NORVASC) 5 MG tablet Take 1 tablet (5 mg total) by mouth daily.  30 tablet  3  . atorvastatin (LIPITOR) 40 MG tablet TAKE 1 TABLET (40 MG TOTAL) BY MOUTH DAILY.  90 tablet  0  . clonazePAM (KLONOPIN) 1 MG tablet TAKE 1 TABLET BY MOUTH 3 TIMES A DAY AS NEEDED  90 tablet  0  . escitalopram (LEXAPRO) 20 MG tablet Take 1 tablet (20 mg total) by mouth daily.  90 tablet  3  . levothyroxine (SYNTHROID, LEVOTHROID) 150 MCG tablet Take 1 tablet (150 mcg total) by mouth daily.  90 tablet  4  . losartan (COZAAR) 100 MG tablet TAKE 1 TABLET DAILY.  30 tablet  3  . Multiple Vitamins-Minerals (MULTIVITAMIN WITH MINERALS) tablet Take 1 tablet by mouth daily.      Marland Kitchen zolpidem (AMBIEN) 10 MG tablet TAKE 1 TABLET BY MOUTH AT BEDTIME AS NEEDED FOR SLEEP  90 tablet  1   No facility-administered encounter medications on file as of 11/18/2012.   BP 120/80  Pulse 102  Temp(Src) 98.3 F (36.8 C) (Oral)  Ht 5\' 1"  (1.549 m)  Wt 153 lb 8 oz (69.627 kg)  BMI 29.02 kg/m2  SpO2 95%  Review of Systems  Constitutional: Negative for fever, chills, appetite change, fatigue and unexpected weight change.  HENT: Negative for congestion, ear pain, sinus pressure, sore throat, trouble swallowing and voice change.   Eyes: Negative for visual disturbance.  Respiratory: Negative for cough, shortness of breath, wheezing and stridor.    Cardiovascular: Negative for chest pain, palpitations and leg swelling.  Gastrointestinal: Negative for nausea, vomiting, abdominal pain, diarrhea, constipation, blood in stool, abdominal distention and anal bleeding.  Genitourinary: Negative for dysuria and flank pain.  Musculoskeletal: Negative for arthralgias, gait problem, myalgias and neck pain.  Skin: Negative for color change and rash.  Neurological: Negative for dizziness and headaches.  Hematological: Negative for adenopathy. Does not bruise/bleed easily.  Psychiatric/Behavioral: Negative for suicidal ideas, sleep disturbance and dysphoric mood. The patient is not nervous/anxious.        Objective:   Physical Exam  Constitutional: She is oriented to person, place, and time. She appears well-developed and well-nourished. No distress.  HENT:  Head: Normocephalic and atraumatic.  Right Ear: External ear normal.  Left Ear: External ear normal.  Nose: Nose normal.  Mouth/Throat: Oropharynx is clear and moist. No oropharyngeal exudate.  Eyes: Conjunctivae are normal. Pupils are equal, round, and reactive to light. Right eye exhibits no discharge. Left eye exhibits no discharge. No scleral icterus.  Neck: Normal range of motion. Neck supple. No tracheal deviation present. No thyromegaly present.  Cardiovascular: Normal rate, regular rhythm, normal heart sounds and intact distal pulses.  Exam reveals no gallop and no friction rub.   No murmur heard. Pulmonary/Chest: Effort normal and breath sounds normal. No accessory muscle usage. Not tachypneic. No respiratory distress. She has no decreased breath sounds. She has no wheezes. She has no rhonchi. She has no rales.  She exhibits no tenderness.  Musculoskeletal: Normal range of motion. She exhibits no edema and no tenderness.  Lymphadenopathy:    She has no cervical adenopathy.  Neurological: She is alert and oriented to person, place, and time. No cranial nerve deficit. She exhibits  normal muscle tone. Coordination normal.  Skin: Skin is warm and dry. No rash noted. She is not diaphoretic. No erythema. No pallor.  Psychiatric: She has a normal mood and affect. Her behavior is normal. Judgment and thought content normal.          Assessment & Plan:

## 2012-11-18 NOTE — Assessment & Plan Note (Signed)
Thyroid function normal on recent labs. Will continue current dose levothyroxine.

## 2012-11-18 NOTE — Assessment & Plan Note (Signed)
BP Readings from Last 3 Encounters:  11/18/12 120/80  10/03/12 128/82  09/20/12 141/92   BP well controlled on current medications. Will continue.

## 2012-11-18 NOTE — Assessment & Plan Note (Signed)
Symptoms well controlled on current medication. Will continue. 

## 2012-11-20 ENCOUNTER — Encounter: Payer: Self-pay | Admitting: *Deleted

## 2012-11-21 ENCOUNTER — Telehealth: Payer: Self-pay | Admitting: Internal Medicine

## 2012-11-21 MED ORDER — CLONAZEPAM 1 MG PO TABS: ORAL_TABLET | ORAL | Status: DC

## 2012-11-21 NOTE — Telephone Encounter (Signed)
clonazePAM (KLONOPIN) 1 MG tablet   The patient is completely out .

## 2012-11-21 NOTE — Telephone Encounter (Signed)
Patient came in did UDS and prescription was faxed to pharmacy

## 2012-11-29 ENCOUNTER — Telehealth: Payer: Self-pay | Admitting: Internal Medicine

## 2012-11-29 NOTE — Telephone Encounter (Signed)
Carotid doppler was normal.

## 2012-12-03 NOTE — Telephone Encounter (Signed)
Patient informed and verbalized understanding

## 2012-12-09 ENCOUNTER — Encounter: Payer: Self-pay | Admitting: Internal Medicine

## 2012-12-12 ENCOUNTER — Encounter: Payer: Self-pay | Admitting: Internal Medicine

## 2012-12-15 ENCOUNTER — Other Ambulatory Visit: Payer: Self-pay | Admitting: Internal Medicine

## 2012-12-16 ENCOUNTER — Other Ambulatory Visit: Payer: BC Managed Care – PPO

## 2012-12-16 ENCOUNTER — Encounter: Payer: Self-pay | Admitting: Emergency Medicine

## 2012-12-17 ENCOUNTER — Other Ambulatory Visit (INDEPENDENT_AMBULATORY_CARE_PROVIDER_SITE_OTHER): Payer: BC Managed Care – PPO

## 2012-12-17 DIAGNOSIS — R7989 Other specified abnormal findings of blood chemistry: Secondary | ICD-10-CM

## 2012-12-17 LAB — COMPREHENSIVE METABOLIC PANEL
ALT: 46 U/L — ABNORMAL HIGH (ref 0–35)
AST: 23 U/L (ref 0–37)
Alkaline Phosphatase: 90 U/L (ref 39–117)
BUN: 18 mg/dL (ref 6–23)
Calcium: 9.8 mg/dL (ref 8.4–10.5)
Chloride: 102 mEq/L (ref 96–112)
Creatinine, Ser: 0.8 mg/dL (ref 0.4–1.2)
Total Bilirubin: 1 mg/dL (ref 0.3–1.2)

## 2012-12-18 ENCOUNTER — Encounter: Payer: Self-pay | Admitting: *Deleted

## 2012-12-21 ENCOUNTER — Other Ambulatory Visit: Payer: Self-pay | Admitting: Internal Medicine

## 2013-01-02 ENCOUNTER — Encounter (INDEPENDENT_AMBULATORY_CARE_PROVIDER_SITE_OTHER): Payer: Self-pay

## 2013-01-02 ENCOUNTER — Encounter: Payer: Self-pay | Admitting: Internal Medicine

## 2013-01-02 ENCOUNTER — Ambulatory Visit (INDEPENDENT_AMBULATORY_CARE_PROVIDER_SITE_OTHER): Payer: BC Managed Care – PPO | Admitting: Internal Medicine

## 2013-01-02 VITALS — BP 130/92 | HR 94 | Temp 97.9°F | Wt 153.0 lb

## 2013-01-02 DIAGNOSIS — M549 Dorsalgia, unspecified: Secondary | ICD-10-CM | POA: Insufficient documentation

## 2013-01-02 MED ORDER — CLONAZEPAM 1 MG PO TABS: ORAL_TABLET | ORAL | Status: DC

## 2013-01-02 MED ORDER — CYCLOBENZAPRINE HCL 5 MG PO TABS: 5.0000 mg | ORAL_TABLET | Freq: Three times a day (TID) | ORAL | Status: DC | PRN

## 2013-01-02 NOTE — Progress Notes (Signed)
Pre-visit discussion using our clinic review tool. No additional management support is needed unless otherwise documented below in the visit note.  

## 2013-01-02 NOTE — Progress Notes (Signed)
Subjective:    Patient ID: Christie Ramirez, female    DOB: 08-23-1955, 57 y.o.   MRN: [id number]  HPI 57YO female with h/o anxiety, hypertension, hypothyroidism presents for acute visit c/o right-mid back pain x 1 week. Notes she has been bending and lifting frequently at work. Developed right mid back pain described as aching. Minimal improvement with ibuprofen and applying ice to area. No dyspnea, cough, fever, chills. Pain is made worse with movement.   Outpatient Encounter Prescriptions as of 01/02/2013  Medication Sig  . amLODipine (NORVASC) 5 MG tablet Take 1 tablet (5 mg total) by mouth daily.  Marland Kitchen atorvastatin (LIPITOR) 40 MG tablet TAKE 1 TABLET (40 MG TOTAL) BY MOUTH DAILY.  . clonazePAM (KLONOPIN) 1 MG tablet TAKE 1 TABLET BY MOUTH 3 TIMES A DAY AS NEEDED  . escitalopram (LEXAPRO) 20 MG tablet TAKE 1 TABLET (20 MG TOTAL) BY MOUTH DAILY.  Marland Kitchen levothyroxine (SYNTHROID, LEVOTHROID) 150 MCG tablet Take 1 tablet (150 mcg total) by mouth daily.  Marland Kitchen losartan (COZAAR) 100 MG tablet TAKE 1 TABLET DAILY.  . Multiple Vitamins-Minerals (MULTIVITAMIN WITH MINERALS) tablet Take 1 tablet by mouth daily.  Marland Kitchen zolpidem (AMBIEN) 10 MG tablet TAKE 1 TABLET BY MOUTH AT BEDTIME AS NEEDED FOR SLEEP   BP 130/92  Pulse 94  Temp(Src) 97.9 F (36.6 C) (Oral)  Wt 153 lb (69.4 kg)  SpO2 90%  Review of Systems  Constitutional: Negative for fever, chills and fatigue.  Respiratory: Negative for cough, shortness of breath, wheezing and stridor.   Cardiovascular: Negative for chest pain, palpitations and leg swelling.  Gastrointestinal: Negative for abdominal pain.  Musculoskeletal: Positive for back pain and myalgias. Negative for arthralgias.  Neurological: Negative for weakness.  Psychiatric/Behavioral: Positive for sleep disturbance and dysphoric mood. Negative for suicidal ideas and self-injury. The patient is nervous/anxious.        Objective:   Physical Exam  Constitutional: She is oriented to  person, place, and time. She appears well-developed and well-nourished. No distress.  HENT:  Head: Normocephalic and atraumatic.  Right Ear: External ear normal.  Left Ear: External ear normal.  Nose: Nose normal.  Mouth/Throat: Oropharynx is clear and moist. No oropharyngeal exudate.  Eyes: Conjunctivae are normal. Pupils are equal, round, and reactive to light. Right eye exhibits no discharge. Left eye exhibits no discharge. No scleral icterus.  Neck: Normal range of motion. Neck supple. No tracheal deviation present. No thyromegaly present.  Cardiovascular: Normal rate, regular rhythm, normal heart sounds and intact distal pulses.  Exam reveals no gallop and no friction rub.   No murmur heard. Pulmonary/Chest: Effort normal and breath sounds normal. No accessory muscle usage. Not tachypneic. No respiratory distress. She has no decreased breath sounds. She has no wheezes. She has no rhonchi. She has no rales. She exhibits no tenderness.  Musculoskeletal: Normal range of motion. She exhibits no edema.       Thoracic back: She exhibits tenderness, pain and spasm. She exhibits normal range of motion and no bony tenderness.       Back:  Lymphadenopathy:    She has no cervical adenopathy.  Neurological: She is alert and oriented to person, place, and time. No cranial nerve deficit. She exhibits normal muscle tone. Coordination normal.  Skin: Skin is warm and dry. No rash noted. She is not diaphoretic. No erythema. No pallor.  Psychiatric: She has a normal mood and affect. Her behavior is normal. Judgment and thought content normal.  Assessment & Plan:

## 2013-01-02 NOTE — Assessment & Plan Note (Signed)
Symptoms and exam most consistent with muscular strain. Will add Flexeril to see if any improvement. Continue Ibuprofen 800mg  po tid prn. Encouraged her to ice the area and reviewed some stretching exercises. Follow up prn.

## 2013-01-04 ENCOUNTER — Other Ambulatory Visit: Payer: Self-pay | Admitting: Internal Medicine

## 2013-01-23 DIAGNOSIS — Z923 Personal history of irradiation: Secondary | ICD-10-CM

## 2013-01-23 HISTORY — DX: Personal history of irradiation: Z92.3

## 2013-01-27 ENCOUNTER — Other Ambulatory Visit: Payer: Self-pay | Admitting: Internal Medicine

## 2013-01-28 ENCOUNTER — Other Ambulatory Visit: Payer: Self-pay | Admitting: Internal Medicine

## 2013-02-08 ENCOUNTER — Other Ambulatory Visit: Payer: Self-pay | Admitting: Internal Medicine

## 2013-02-13 ENCOUNTER — Ambulatory Visit (INDEPENDENT_AMBULATORY_CARE_PROVIDER_SITE_OTHER): Payer: BC Managed Care – PPO

## 2013-02-13 ENCOUNTER — Ambulatory Visit (INDEPENDENT_AMBULATORY_CARE_PROVIDER_SITE_OTHER): Payer: BC Managed Care – PPO | Admitting: Podiatrist

## 2013-02-13 ENCOUNTER — Encounter: Payer: Self-pay | Admitting: Podiatrist

## 2013-02-13 ENCOUNTER — Telehealth: Payer: Self-pay | Admitting: *Deleted

## 2013-02-13 VITALS — BP 136/87 | HR 107 | Resp 16 | Ht 61.0 in | Wt 149.0 lb

## 2013-02-13 DIAGNOSIS — M79609 Pain in unspecified limb: Secondary | ICD-10-CM

## 2013-02-13 DIAGNOSIS — M79673 Pain in unspecified foot: Secondary | ICD-10-CM

## 2013-02-13 DIAGNOSIS — M898X9 Other specified disorders of bone, unspecified site: Secondary | ICD-10-CM

## 2013-02-13 DIAGNOSIS — M779 Enthesopathy, unspecified: Secondary | ICD-10-CM

## 2013-02-13 DIAGNOSIS — M205X1 Other deformities of toe(s) (acquired), right foot: Secondary | ICD-10-CM

## 2013-02-13 DIAGNOSIS — M205X9 Other deformities of toe(s) (acquired), unspecified foot: Secondary | ICD-10-CM

## 2013-02-13 MED ORDER — TRIAMCINOLONE ACETONIDE 10 MG/ML IJ SUSP
10.0000 mg | Freq: Once | INTRAMUSCULAR | Status: AC
  Administered 2013-02-13: 10 mg

## 2013-02-13 NOTE — Telephone Encounter (Signed)
Pt called said she was in excruciating pain from the injection this morning. Said the pain is in the bottom of her foot and going up her leg. Said she had to take her shoe off because of the pain. What do you suggest?

## 2013-02-13 NOTE — Patient Instructions (Signed)
Pre-Operative Instructions  Congratulations, you have decided to take an important step to improving your quality of life.  You can be assured that the doctors of [facility] will be with you every step of the way.  1. Plan to be at the surgery center/hospital at least 1 (one) hour prior to your scheduled time unless otherwise directed by the surgical center/hospital staff.  You must have a responsible adult accompany you, remain during the surgery and drive you home.  Make sure you have directions to the surgical center/hospital and know how to get there on time. 2. For hospital based surgery you will need to obtain a history and physical form from your family physician within 1 month prior to the date of surgery- we will give you a form for you primary physician.  3. We make every effort to accommodate the date you request for surgery.  There are however, times where surgery dates or times have to be moved.  We will contact you as soon as possible if a change in schedule is required.   4. No Aspirin/Ibuprofen for one week before surgery.  If you are on aspirin, any non-steroidal anti-inflammatory medications (Mobic, Aleve, Ibuprofen) you should stop taking it 7 days prior to your surgery.  You make take Tylenol  For pain prior to surgery.  5. Medications- If you are taking daily heart and blood pressure medications, seizure, reflux, allergy, asthma, anxiety, pain or diabetes medications, make sure the surgery center/hospital is aware before the day of surgery so they may notify you which medications to take or avoid the day of surgery. 6. No food or drink after midnight the night before surgery unless directed otherwise by surgical center/hospital staff. 7. No alcoholic beverages 24 hours prior to surgery.  No smoking 24 hours prior to or 24 hours after surgery. 8. Wear loose pants or shorts- loose enough to fit over bandages, boots, and casts. 9. No slip on shoes, sneakers are best. 10. Bring  your boot with you to the surgery center/hospital.  Also bring crutches or a walker if your physician has prescribed it for you.  If you do not have this equipment, it will be provided for you after surgery. 11. If you have not been contracted by the surgery center/hospital by the day before your surgery, call to confirm the date and time of your surgery. 12. Leave-time from work may vary depending on the type of surgery you have.  Appropriate arrangements should be made prior to surgery with your employer. 13. Prescriptions will be provided immediately following surgery by your doctor.  Have these filled as soon as possible after surgery and take the medication as directed. 14. Remove nail polish on the operative foot. 15. Wash the night before surgery.  The night before surgery wash the foot and leg well with the antibacterial soap provided and water paying special attention to beneath the toenails and in between the toes.  Rinse thoroughly with water and dry well with a towel.  Perform this wash unless told not to do so by your physician.  Enclosed: 1 Ice pack (please put in freezer the night before surgery)   1 Hibiclens skin cleaner   Pre-op Instructions  If you have any questions regarding the instructions, do not hesitate to call our office.  [place]: [address] , [place] [address] [phone]  [place]: [address] [place], [place] [address] [phone]   [place]: [address]  , [place] [address] [phone]  Dr. Richard   Tuchman DPM, Dr. Norman Regal DPM Dr. Richard Sikora DPM, Dr. M. Todd Hyatt DPM, Dr. Jonta Gastineau DPM 

## 2013-02-13 NOTE — Progress Notes (Signed)
Subjective: Christie Ramirez presents today for followup of right great toe pain. She states that the injection I gave her 2 years ago was beneficial however the toe is stiff and uncomfortable especially in enclosed shoes. Patient works 2 jobs and would like to know her options for this foot. Objective: Neurovascular status is intact with palpable pedal pulses bilateral and neurological sensation intact both epicriticaly and protectively. She has a large bony exostosis on the dorsal aspect of the first metatarsal head which is seen both clinically and radiographically. It is painful and symptomatic and limitation of range of motion with dorsiflexion is also noted. 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 An injection was carried out at today's visit under sterile technique with Kenalog and Marcaine mixture into the right great toe joint. Surgical discussion was also carried out and I Recommended surgical removal of the bony exostosis. The consent form was discussed and all three pages were signed and the patient's questions were encouraged and answered to the best of my ability. Risks of the surgery include  but not limited to continued pain, infection, swelling, elevated toe, decreased range of motion,  Or suture or implant reaction. Preoperative instructions were also dispensed to the patient as well as a preoperative surgical pamphlet to go along with the instructions. Surgery will be scheduled at the patients convenience and patient will be seen at [facility] specialty surgery center on outpatient basis. If  any questions or concerns prior to her surgical date She is instructed to call.

## 2013-02-14 ENCOUNTER — Telehealth: Payer: Self-pay | Admitting: *Deleted

## 2013-02-14 NOTE — Telephone Encounter (Signed)
SHELLY CALLED AND SPOKE WITH PT YESTERDAY 1.22.15 LETTING HER KNOW PER DR EGERTON: TO ALTERNATE HEAT AND ICE ON THE FOOT ESPECIALLY AROUND THE INJECTION SITE. ALSO ASK HER TO ELEVATE HER FOOT AND WRAP THE FOOT AND ANKLE WITH AN ACE WRAP. I CALLED PT TODAY 1.23.15 AND SHE SAID EVERYTHING WORKED AND SHE IS DOING MUCH BETTER TODAY.

## 2013-02-18 ENCOUNTER — Encounter: Payer: Self-pay | Admitting: Internal Medicine

## 2013-02-18 ENCOUNTER — Ambulatory Visit (INDEPENDENT_AMBULATORY_CARE_PROVIDER_SITE_OTHER): Payer: BC Managed Care – PPO | Admitting: Internal Medicine

## 2013-02-18 VITALS — BP 134/90 | HR 91 | Temp 97.7°F | Ht 61.5 in | Wt 153.0 lb

## 2013-02-18 DIAGNOSIS — Z0000 Encounter for general adult medical examination without abnormal findings: Secondary | ICD-10-CM

## 2013-02-18 DIAGNOSIS — Z01818 Encounter for other preprocedural examination: Secondary | ICD-10-CM

## 2013-02-18 MED ORDER — CLONAZEPAM 1 MG PO TABS: ORAL_TABLET | ORAL | Status: DC

## 2013-02-18 NOTE — Progress Notes (Signed)
Pre-visit discussion using our clinic review tool. No additional management support is needed unless otherwise documented below in the visit note.  

## 2013-02-18 NOTE — Patient Instructions (Signed)
Check with insurance about coverage for Prevnar

## 2013-02-19 ENCOUNTER — Telehealth: Payer: Self-pay | Admitting: *Deleted

## 2013-02-19 DIAGNOSIS — Z01818 Encounter for other preprocedural examination: Secondary | ICD-10-CM | POA: Insufficient documentation

## 2013-02-19 LAB — MICROALBUMIN / CREATININE URINE RATIO
CREATININE, U: 37.9 mg/dL
MICROALB UR: 0.2 mg/dL (ref 0.0–1.9)
Microalb Creat Ratio: 0.5 mg/g (ref 0.0–30.0)

## 2013-02-19 LAB — COMPREHENSIVE METABOLIC PANEL
ALBUMIN: 4.2 g/dL (ref 3.5–5.2)
ALK PHOS: 71 U/L (ref 39–117)
ALT: 67 U/L — ABNORMAL HIGH (ref 0–35)
AST: 30 U/L (ref 0–37)
BUN: 31 mg/dL — AB (ref 6–23)
CO2: 26 mEq/L (ref 19–32)
Calcium: 9.3 mg/dL (ref 8.4–10.5)
Chloride: 103 mEq/L (ref 96–112)
Creatinine, Ser: 0.9 mg/dL (ref 0.4–1.2)
GFR: 71.47 mL/min (ref >60.00)
Glucose, Bld: 85 mg/dL (ref 70–99)
Potassium: 4.1 mEq/L (ref 3.5–5.1)
Sodium: 137 mEq/L (ref 135–145)
Total Bilirubin: 0.8 mg/dL (ref 0.3–1.2)
Total Protein: 7.5 g/dL (ref 6.0–8.3)

## 2013-02-19 LAB — CBC WITH DIFFERENTIAL/PLATELET
Basophils Absolute: 0 10*3/uL (ref 0.0–0.1)
Basophils Relative: 0.2 % (ref 0.0–3.0)
EOS ABS: 0.1 10*3/uL (ref 0.0–0.7)
Eosinophils Relative: 0.7 % (ref 0.0–5.0)
HEMATOCRIT: 40.4 % (ref 36.0–46.0)
Hemoglobin: 12.9 g/dL (ref 12.0–15.0)
LYMPHS ABS: 2.8 10*3/uL (ref 0.7–4.0)
Lymphocytes Relative: 24 % (ref 12.0–46.0)
MCHC: 32.1 g/dL (ref 30.0–36.0)
MCV: 91.6 fl (ref 78.0–100.0)
Monocytes Absolute: 0.6 10*3/uL (ref 0.1–1.0)
Monocytes Relative: 4.9 % (ref 3.0–12.0)
NEUTROS ABS: 8.2 10*3/uL — AB (ref 1.4–7.7)
Neutrophils Relative %: 70.2 % (ref 43.0–77.0)
Platelets: 395 10*3/uL (ref 150.0–400.0)
RBC: 4.41 Mil/uL (ref 3.87–5.11)
RDW: 13.2 % (ref 11.5–14.6)
WBC: 11.7 10*3/uL — ABNORMAL HIGH (ref 4.5–10.5)

## 2013-02-19 LAB — TSH: TSH: 1.79 u[IU]/mL (ref 0.35–5.50)

## 2013-02-19 LAB — RUBEOLA ANTIBODY IGG

## 2013-02-19 LAB — LIPID PANEL
Cholesterol: 250 mg/dL — ABNORMAL HIGH (ref 0–200)
HDL: 69.3 mg/dL (ref >39.00)
Total CHOL/HDL Ratio: 4
Triglycerides: 182 mg/dL — ABNORMAL HIGH (ref 0.0–149.0)
VLDL: 36.4 mg/dL (ref 0.0–40.0)

## 2013-02-19 LAB — ABO AND RH: Rh Type: POSITIVE

## 2013-02-19 LAB — LDL CHOLESTEROL, DIRECT: LDL DIRECT: 165.4 mg/dL

## 2013-02-19 NOTE — Telephone Encounter (Signed)
Left message for patient return call or call back to schedule a lab appointment.

## 2013-02-19 NOTE — Assessment & Plan Note (Signed)
General medical exam is normal today including breast exam except as noted. Pelvic and Pap smear deferred as up to date and completed by her GYN. Mammogram is ordered. Colonoscopy is up-to-date. Encouraged healthy diet and regular physical activity. Discussed vaccination with Prevnar. Patient will look into insurance coverage for this. Will also check immunity to measles today. Labs including CBC, CMP, lipid profile.

## 2013-02-19 NOTE — Telephone Encounter (Signed)
[facility] called stating that pt pt/inr was abnormally low and that the need pt to come back for a re-collect

## 2013-02-19 NOTE — Assessment & Plan Note (Signed)
Patient would be considered low risk for perioperative cardiac events. Recommend proceeding with surgery.

## 2013-02-19 NOTE — Progress Notes (Signed)
Subjective:    Patient ID: Christie Ramirez, female    DOB: Mar 04, 1955, 58 y.o.   MRN: [id number]  HPI 58 year old female with history of hypertension, hyperlipidemia, hypothyroidism presents for annual exam. She reports she is generally feeling well. She is scheduled for surgical removal of a bony tumor on her right foot in the next few weeks. She reports that she has never had problems with easy bleeding or bruising. She has had difficulty waking up from general anesthesia and has opted for local anesthesia for this procedure. She has not had any recent upper respiratory infections, fever, chills, urinary symptoms. She is generally feeling well. She is compliant with medications.  Outpatient Encounter Prescriptions as of 02/18/2013  Medication Sig  . amLODipine (NORVASC) 5 MG tablet TAKE 1 TABLET BY MOUTH EVERY DAY  . aspirin 81 MG tablet Take 81 mg by mouth daily.  Marland Kitchen atorvastatin (LIPITOR) 40 MG tablet TAKE 1 TABLET BY MOUTH EVERY DAY  . clonazePAM (KLONOPIN) 1 MG tablet TAKE 1 TABLET BY MOUTH 3 TIMES A DAY AS NEEDED  . escitalopram (LEXAPRO) 20 MG tablet TAKE 1 TABLET (20 MG TOTAL) BY MOUTH DAILY.  Marland Kitchen levothyroxine (SYNTHROID, LEVOTHROID) 150 MCG tablet Take 1 tablet (150 mcg total) by mouth daily.  Marland Kitchen losartan (COZAAR) 100 MG tablet TAKE 1 TABLET BY MOUTH EVERY DAY  . Multiple Vitamins-Minerals (MULTIVITAMIN WITH MINERALS) tablet Take 1 tablet by mouth daily.  Marland Kitchen zolpidem (AMBIEN) 10 MG tablet TAKE 1 TABLET BY MOUTH AT BEDTIME AS NEEDED FOR SLEEP   BP 134/90  Pulse 91  Temp(Src) 97.7 F (36.5 C) (Oral)  Ht 5' 1.5" (1.562 m)  Wt 153 lb (69.4 kg)  BMI 28.44 kg/m2  SpO2 95%  Review of Systems  Constitutional: Negative for fever, chills, appetite change, fatigue and unexpected weight change.  HENT: Negative for congestion, ear pain, sinus pressure, sore throat, trouble swallowing and voice change.   Eyes: Negative for visual disturbance.  Respiratory: Negative for cough, shortness of  breath, wheezing and stridor.   Cardiovascular: Negative for chest pain, palpitations and leg swelling.  Gastrointestinal: Negative for nausea, vomiting, abdominal pain, diarrhea, constipation, blood in stool, abdominal distention and anal bleeding.  Genitourinary: Negative for dysuria and flank pain.  Musculoskeletal: Negative for arthralgias, gait problem, myalgias and neck pain.  Skin: Negative for color change and rash.  Neurological: Negative for dizziness and headaches.  Hematological: Negative for adenopathy. Does not bruise/bleed easily.  Psychiatric/Behavioral: Negative for suicidal ideas, sleep disturbance and dysphoric mood. The patient is not nervous/anxious.        Objective:   Physical Exam  Constitutional: She is oriented to person, place, and time. She appears well-developed and well-nourished. No distress.  HENT:  Head: Normocephalic and atraumatic.  Right Ear: External ear normal.  Left Ear: External ear normal.  Nose: Nose normal.  Mouth/Throat: Oropharynx is clear and moist. No oropharyngeal exudate.  Eyes: Conjunctivae are normal. Pupils are equal, round, and reactive to light. Right eye exhibits no discharge. Left eye exhibits no discharge. No scleral icterus.  Neck: Normal range of motion. Neck supple. No tracheal deviation present. No thyromegaly present.  Cardiovascular: Normal rate, regular rhythm, normal heart sounds and intact distal pulses.  Exam reveals no gallop and no friction rub.   No murmur heard. Pulmonary/Chest: Effort normal and breath sounds normal. No accessory muscle usage. Not tachypneic. No respiratory distress. She has no decreased breath sounds. She has no wheezes. She has no rales. She exhibits no tenderness. Right  breast exhibits no inverted nipple, no mass, no nipple discharge, no skin change and no tenderness. Left breast exhibits no inverted nipple, no mass, no nipple discharge, no skin change and no tenderness. Breasts are symmetrical.    Abdominal: Soft. Bowel sounds are normal. She exhibits no distension and no mass. There is no tenderness. There is no rebound and no guarding.  Musculoskeletal: Normal range of motion. She exhibits no edema and no tenderness.       Feet:  Lymphadenopathy:    She has no cervical adenopathy.  Neurological: She is alert and oriented to person, place, and time. No cranial nerve deficit. She exhibits normal muscle tone. Coordination normal.  Skin: Skin is warm and dry. No rash noted. She is not diaphoretic. No erythema. No pallor.  Psychiatric: She has a normal mood and affect. Her behavior is normal. Judgment and thought content normal.          Assessment & Plan:

## 2013-02-21 ENCOUNTER — Other Ambulatory Visit: Payer: BC Managed Care – PPO

## 2013-02-24 ENCOUNTER — Other Ambulatory Visit (INDEPENDENT_AMBULATORY_CARE_PROVIDER_SITE_OTHER): Payer: BC Managed Care – PPO

## 2013-02-24 DIAGNOSIS — Z01818 Encounter for other preprocedural examination: Secondary | ICD-10-CM

## 2013-02-24 LAB — PROTIME-INR
INR: 0.9 ratio (ref 0.8–1.0)
Prothrombin Time: 9.1 s — ABNORMAL LOW (ref 10.2–12.4)

## 2013-02-24 NOTE — Telephone Encounter (Signed)
Patient came in for repeat lab today

## 2013-02-25 ENCOUNTER — Telehealth: Payer: Self-pay | Admitting: *Deleted

## 2013-02-25 NOTE — Telephone Encounter (Signed)
Patient came in for labs yesterday and wanted to know why she received a bill from her insurance company for the urine test ran here in the office. The bill was for $855 and she would like this taken care of. I told I fwd this to office manager because she was not supposed to have gotten a bill for this.

## 2013-02-26 DIAGNOSIS — M2020 Hallux rigidus, unspecified foot: Secondary | ICD-10-CM

## 2013-02-27 ENCOUNTER — Telehealth: Payer: Self-pay | Admitting: *Deleted

## 2013-02-27 NOTE — Telephone Encounter (Signed)
Pt states had surgery yesterday, and has a small amount dried blood on the dressing and the foot is throbbing.  Pt states she is so impressed with Dr Valentina Lucks and her surgery she is referring a friend.  I left a message, dried blood is not unusual, mark any area greater than a 50cent piece.  I instructed pt to remove the ace only, elevate the foot for 42minutes, then rewrap the area looser, and to have the boot on at all times otherwise.   I encouraged the pt to call with concerns.

## 2013-02-27 NOTE — Telephone Encounter (Signed)
I have called and spoke with patient and advised her to contact [facility] and they would handle the bill for her.

## 2013-03-06 ENCOUNTER — Ambulatory Visit (INDEPENDENT_AMBULATORY_CARE_PROVIDER_SITE_OTHER): Payer: BC Managed Care – PPO | Admitting: Podiatrist

## 2013-03-06 ENCOUNTER — Ambulatory Visit (INDEPENDENT_AMBULATORY_CARE_PROVIDER_SITE_OTHER): Payer: BC Managed Care – PPO

## 2013-03-06 ENCOUNTER — Encounter: Payer: Self-pay | Admitting: Podiatrist

## 2013-03-06 VITALS — BP 131/89 | HR 102 | Resp 16 | Ht 61.0 in | Wt 150.0 lb

## 2013-03-06 DIAGNOSIS — Z9889 Other specified postprocedural states: Secondary | ICD-10-CM

## 2013-03-07 NOTE — Progress Notes (Signed)
Subjective: Patient presents today1 week status post foot surgery of the right foot.  A Cheilectomy was performed at [facility].  Date of surgery 02-26-2013. Patient denies nausea, vomiting, fevers, chills or night sweats.  Denies calf pain or tenderness to the operative side. States she's doing well with surgery and she started back to work.  Objective:  Neurovascular status is intact with palpable pedal pulses DP and PT bilateral at 2+ out of 4. Neurological sensation is intact and unchanged as per prior to surgery. Excellent appearance of the postoperative foot is noted. Incision site is well coapted with suture line in place. Minimal swelling is present. Range of motion is improved as per prior to surgery. X-rays show bony exostosis have been removed surgically. Normal postoperative appearance is seen.  Assessment: Status post cheilectomy of the right foot  Plan:  Redressed the foot and a dry sterile compressive dressing. Recommended she continue wear her Darco shoe. I removed the suture in the today's visit and she is allowed to get the foot wet while sitting in a shower chair. She can progress slowly into a running type of sneaker in one weeks' time. I will see her back in 2 weeks for followup and recheck. If she has any problems or concerns she is instructed to call me immediately.

## 2013-03-19 ENCOUNTER — Telehealth: Payer: Self-pay | Admitting: Internal Medicine

## 2013-03-19 ENCOUNTER — Other Ambulatory Visit: Payer: Self-pay | Admitting: Internal Medicine

## 2013-03-19 NOTE — Telephone Encounter (Signed)
Since Dr. Gilford Rile is not here and patient is calling about this medication could you please approve this, last refill on 10/02/12 #90 with 1 RF. She up to date with her appointments

## 2013-03-19 NOTE — Telephone Encounter (Signed)
Ok refill? 

## 2013-03-19 NOTE — Telephone Encounter (Signed)
Pt states [facility] has sent refill request for her Ambien.  Pt is concerned with the weather coming in and the weekend she will not have her medication.  She does have #2 pills left.  Advised it can take up to 72 hours for refill.  Pt asking if there is any way this can be refilled today.

## 2013-03-19 NOTE — Telephone Encounter (Signed)
This was addressed in previous encounter

## 2013-03-19 NOTE — Telephone Encounter (Signed)
Raquel signed and prescription faxed to pharmacy.

## 2013-03-20 ENCOUNTER — Ambulatory Visit (INDEPENDENT_AMBULATORY_CARE_PROVIDER_SITE_OTHER): Payer: BC Managed Care – PPO | Admitting: Podiatry

## 2013-03-20 ENCOUNTER — Encounter: Payer: BC Managed Care – PPO | Admitting: Podiatrist

## 2013-03-20 VITALS — BP 116/87 | HR 101 | Resp 16 | Ht 61.0 in | Wt 147.0 lb

## 2013-03-20 DIAGNOSIS — Z9889 Other specified postprocedural states: Secondary | ICD-10-CM

## 2013-03-20 NOTE — Progress Notes (Signed)
She presents today status post cheilectomy first metatarsophalangeal joint of the right foot. She states that she has pain every now and again. She denies fever chills nausea vomiting muscle aches and pains. Continues to wear her Darco shoe.  Objective: Vital signs are stable she is alert and oriented x3. She presents in a Darco shoe today and compression dressing. She has a good range of motion of the first metatarsophalangeal joint. With minimal edema no erythema saline is drainage or odor incision site appears to be healing nicely. Radiographic evaluation demonstrates well-healing surgical foot.  Assessment: Well-healing surgical foot right.  Plan: I encouraged her to continue her range of motion exercises. She is to use her Darco shoe anytime she is up and about in the house she does not have a tissue on. Otherwise she is able to wear tennis shoes or proximal in order to assist in increasing her range of motion. She will followup with Dr. Rolley Sims in 2 weeks

## 2013-03-24 ENCOUNTER — Encounter: Payer: Self-pay | Admitting: Podiatrist

## 2013-03-26 ENCOUNTER — Telehealth: Payer: Self-pay | Admitting: Internal Medicine

## 2013-03-26 NOTE — Telephone Encounter (Signed)
I have not yet seen a result on her mammogram. Can you please request a copy from [facility]?

## 2013-03-26 NOTE — Telephone Encounter (Signed)
Fwd to Dr. Walker 

## 2013-03-26 NOTE — Telephone Encounter (Signed)
It is in your folder for review when you come in tomorrow.

## 2013-03-26 NOTE — Telephone Encounter (Signed)
Pt states she had mammogram on Monday.  States she received a call that she needs to go back to see the doctor there to discuss changes in her R breast.  Pt was told Dr. Gilford Rile has received a copy of the report.  Pt is very concerned and would like to know why the doctor needs to speak with her.  Pt asking for a call.

## 2013-03-27 NOTE — Telephone Encounter (Signed)
Called and spoke with pt about mammogram. Mammogram showed asymmetry in the right upper breast. Additional compression views are scheduled. Will follow.

## 2013-03-28 DIAGNOSIS — R928 Other abnormal and inconclusive findings on diagnostic imaging of breast: Secondary | ICD-10-CM | POA: Insufficient documentation

## 2013-03-31 ENCOUNTER — Telehealth: Payer: Self-pay | Admitting: Internal Medicine

## 2013-03-31 DIAGNOSIS — N63 Unspecified lump in unspecified breast: Secondary | ICD-10-CM

## 2013-03-31 NOTE — Telephone Encounter (Signed)
Discussed results of recent mammogram, BIRADS 5, with spiculated 2cm mass right upper outer breast. Will set up referral to Dr. Bary Castilla for evaluation and biopsy.

## 2013-04-02 ENCOUNTER — Encounter: Payer: Self-pay | Admitting: General Surgery

## 2013-04-02 ENCOUNTER — Ambulatory Visit (INDEPENDENT_AMBULATORY_CARE_PROVIDER_SITE_OTHER): Payer: BC Managed Care – PPO | Admitting: General Surgery

## 2013-04-02 ENCOUNTER — Other Ambulatory Visit: Payer: BC Managed Care – PPO

## 2013-04-02 ENCOUNTER — Other Ambulatory Visit: Payer: Self-pay | Admitting: General Surgery

## 2013-04-02 VITALS — BP 134/82 | HR 90 | Resp 16 | Ht 61.0 in | Wt 155.0 lb

## 2013-04-02 DIAGNOSIS — N63 Unspecified lump in unspecified breast: Secondary | ICD-10-CM

## 2013-04-02 DIAGNOSIS — R928 Other abnormal and inconclusive findings on diagnostic imaging of breast: Secondary | ICD-10-CM

## 2013-04-02 HISTORY — PX: BREAST BIOPSY: SHX20

## 2013-04-02 NOTE — Progress Notes (Signed)
Patient ID: Genesis Novosad, female   DOB: [birth year], 58 y.o.   MRN: [id number]  Chief Complaint  Patient presents with  . Other    mammogram    HPI Rindy Kollman is a 58 y.o. female who presents for a breast evaluation. The most recent mammogram and ultrasound was done on 03/28/13 @[facility]  cat 5.Patient does perform regular self breast checks and gets regular mammograms done.  The patient is unaware of any changes in her breasts.  There is no history of trauma.  A native of [place], she has worked extensively as a Haematologist, now working as he had a Academic librarian at [employer].  HPI  Past Medical History  Diagnosis Date  . Hyperlipidemia   . Hypothyroidism   . Anxiety   . Endometriosis 2009  . Cancer 1995    thyroid    Past Surgical History  Procedure Laterality Date  . Colectomy  2009    secondary to endometriosis DR. Smith  . Colonoscopy  2009    Dr. Tamala Julian  . Foot surgery  right  . Abdominal hysterectomy  2005    Martin DeFrancesco,MD  . Colon surgery  2009    Secondary to endometriosis.  Rochel Brome, MD    No family history on file.  Social History History  Substance Use Topics  . Smoking status: Never Smoker   . Smokeless tobacco: Never Used  . Alcohol Use: Yes     Comment: wine    Allergies  Allergen Reactions  . Codeine Anaphylaxis  . Ace Inhibitors   . Iodinated Diagnostic Agents Hives  . Penicillins Rash    Current Outpatient Prescriptions  Medication Sig Dispense Refill  . amLODipine (NORVASC) 5 MG tablet TAKE 1 TABLET BY MOUTH EVERY DAY  30 tablet  5  . aspirin 81 MG tablet Take 81 mg by mouth daily.      Marland Kitchen atorvastatin (LIPITOR) 40 MG tablet TAKE 1 TABLET BY MOUTH EVERY DAY  90 tablet  0  . clonazePAM (KLONOPIN) 1 MG tablet TAKE 1 TABLET BY MOUTH 3 TIMES A DAY AS NEEDED  90 tablet  3  . escitalopram (LEXAPRO) 20 MG tablet TAKE 1 TABLET (20 MG TOTAL) BY MOUTH DAILY.  90 tablet  0  . levothyroxine (SYNTHROID, LEVOTHROID)  150 MCG tablet Take 1 tablet (150 mcg total) by mouth daily.  90 tablet  4  . losartan (COZAAR) 100 MG tablet TAKE 1 TABLET BY MOUTH EVERY DAY  30 tablet  3  . Multiple Vitamins-Minerals (MULTIVITAMIN WITH MINERALS) tablet Take 1 tablet by mouth daily.      Marland Kitchen zolpidem (AMBIEN) 10 MG tablet TAKE 1 TABLET BY MOUTH AT BEDTIME AS NEEDED FOR SLEEP  30 tablet  0   No current facility-administered medications for this visit.    Review of Systems Review of Systems  Constitutional: Negative.   Respiratory: Negative.   Cardiovascular: Negative.     Blood pressure 134/82, pulse 90, resp. rate 16, height 5\' 1"  (1.549 m), weight 155 lb (70.308 kg).  Physical Exam Physical Exam  Constitutional: She is oriented to person, place, and time. She appears well-developed and well-nourished.  Eyes: Conjunctivae are normal.  Neck: Neck supple.  Cardiovascular: Normal rate, regular rhythm and normal heart sounds.   Pulmonary/Chest: Effort normal and breath sounds normal. Right breast exhibits no inverted nipple, no mass, no nipple discharge, no skin change and no tenderness. Left breast exhibits no inverted nipple, no mass, no nipple discharge, no skin change and  no tenderness.  Lymphadenopathy:    She has no cervical adenopathy.    She has no axillary adenopathy.  Neurological: She is alert and oriented to person, place, and time.  Skin: Skin is warm and dry.    Data Reviewed Bilateral mammograms dated March 24, 2013 suggested a asymmetric area in the right upper inner quadrant for which additional views were requested. BI-RAD-0.  Focal spot compression views and ultrasound of the right breast dated March 28, 2013 described a mass in the upper Powder quadrant of the right breast, spiculated in nature and approximately 2 cm in diameter. Targeted ultrasound at 2:00 (upper Inner quadrant) reported an irregular spiculated mass measuring approximately 1.5 cm in diameter with acoustic shadowing. This was felt to  correlate with the mammographic finding. Ultrasound of the axilla was negative. BI-RAD-5.  Ultrasound examination of the right breast the 1 o'clock position 4 cm from the nipple confirmed a 0.6 x 0.8 x 0.9 cm hypoechoic mass with dense acoustic shadowing. The patient was amenable to core biopsy.  10 cc of 0.5% Xylocaine with 0.25% Marcaine with one 200,000 epinephrine was utilized and well tolerated. ChloraPrep was applied to the skin. A 7-gauge Encor device was passed through lesion and 4 core samples obtained with what appeared to be complete removal. A postbiopsy clip was placed. Scant bleeding was noted. The skin defect was closed with benzoin and Steri-Strips followed by Telfa and Tegaderm dressing. The patient tolerated the procedure well.  Assessment    Right breast mass, suspicious for malignancy.     Plan    The patient will be contacted when pathology is available. Written instructions were provided for post biopsy wound care.        Robert Bellow 04/05/2013, 2:58 PM

## 2013-04-02 NOTE — Patient Instructions (Signed)

## 2013-04-04 ENCOUNTER — Telehealth: Payer: Self-pay | Admitting: General Surgery

## 2013-04-04 NOTE — Telephone Encounter (Signed)
The patient was notified that her breast biopsy of April 02, 2013 did show a low grade cancer. She reports doing well since the biopsy, but has had moderate bruising. Minimal pain reported.  The staff will contact her to arrange an office follow up to review options early next week.

## 2013-04-05 ENCOUNTER — Encounter: Payer: Self-pay | Admitting: General Surgery

## 2013-04-08 ENCOUNTER — Ambulatory Visit (INDEPENDENT_AMBULATORY_CARE_PROVIDER_SITE_OTHER): Payer: BC Managed Care – PPO | Admitting: General Surgery

## 2013-04-08 ENCOUNTER — Encounter: Payer: Self-pay | Admitting: General Surgery

## 2013-04-08 VITALS — BP 160/90 | HR 80 | Resp 14 | Ht 61.0 in | Wt 155.0 lb

## 2013-04-08 DIAGNOSIS — C50211 Malignant neoplasm of upper-inner quadrant of right female breast: Secondary | ICD-10-CM

## 2013-04-08 DIAGNOSIS — C50919 Malignant neoplasm of unspecified site of unspecified female breast: Secondary | ICD-10-CM

## 2013-04-08 DIAGNOSIS — C50219 Malignant neoplasm of upper-inner quadrant of unspecified female breast: Secondary | ICD-10-CM

## 2013-04-08 NOTE — Progress Notes (Signed)
Patient ID: Brantleigh Mifflin, female   DOB: 10-10-1955, 58 y.o.   MRN: [id number]  Chief Complaint  Patient presents with  . Follow-up    discussion    HPI Demetra Moya is a 58 y.o. female.  Here today for discussion of breast biopsy results. States she has been using a heating pad to the area.  The patient had been notified by fold of the results. The patient is accompanied by her house mate, Beatriz Chancellor.  HPI  Past Medical History  Diagnosis Date  . Hyperlipidemia   . Hypothyroidism   . Anxiety   . Endometriosis 2009  . Cancer 1995    thyroid  . 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 Colectomy  2009    secondary to endometriosis DR. Smith  . Colonoscopy  2009    Dr. Tamala Julian  . Foot surgery  right  . Abdominal hysterectomy  2005    Martin DeFrancesco,MD  . Colon surgery  2009    Secondary to endometriosis.  Rochel Brome, MD  . 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  Social History History  Substance Use Topics  . Smoking status: Never Smoker   . Smokeless tobacco: Never Used  . Alcohol Use: Yes     Comment: wine    Allergies  Allergen Reactions  . Codeine Anaphylaxis  . Ace Inhibitors   . Iodinated Diagnostic Agents Hives  . Penicillins Rash    Current Outpatient Prescriptions  Medication Sig Dispense Refill  . amLODipine (NORVASC) 5 MG tablet TAKE 1 TABLET BY MOUTH EVERY DAY  30 tablet  5  . aspirin 81 MG tablet Take 81 mg by mouth daily.      Marland Kitchen atorvastatin (LIPITOR) 40 MG tablet TAKE 1 TABLET BY MOUTH EVERY DAY  90 tablet  0  . clonazePAM (KLONOPIN) 1 MG tablet TAKE 1 TABLET BY MOUTH 3 TIMES A DAY AS NEEDED  90 tablet  3  . escitalopram (LEXAPRO) 20 MG tablet TAKE 1 TABLET (20 MG TOTAL) BY MOUTH DAILY.  90 tablet  0  . levothyroxine (SYNTHROID, LEVOTHROID)  150 MCG tablet Take 1 tablet (150 mcg total) by mouth daily.  90 tablet  4  . losartan (COZAAR) 100 MG tablet TAKE 1 TABLET BY MOUTH EVERY DAY  30 tablet  3  . Multiple Vitamins-Minerals (MULTIVITAMIN WITH MINERALS) tablet Take 1 tablet by mouth daily.      Marland Kitchen zolpidem (AMBIEN) 10 MG tablet TAKE 1 TABLET BY MOUTH AT BEDTIME AS NEEDED FOR SLEEP  30 tablet  0   No current facility-administered medications for this visit.    Review of Systems Review of Systems  Constitutional: Negative.   Respiratory: Negative.   Cardiovascular: Negative.     Blood pressure 160/90, pulse 80, resp. rate 14, height 5\' 1"  (1.549 m), weight 155 lb (70.308 kg).  Physical Exam Physical Exam  Constitutional: She appears well-developed and well-nourished.  Pulmonary/Chest:  Large amount of bruising noted to right breast. Dressing removed and steri strip remains in place.    Data Reviewed Pathology showed a low-grade invasive mammary carcinoma, tubular type. Receptor status pending.  Comprehensive metabolic panel from January 2015 shows a mild elevation of the serum ALT, minimally changed exam earlier in the year.   Assessment    Stage I  carcinoma of the right breast.     Plan    Options for management were reviewed. Breast conservation and mastectomy were presented as equal long-term solutions. Opportunities for plastic surgical consultation were discussed. The indication for chemotherapy is not present at this time, with additional information to be obtained at the time of surgery both with final tumor size and nodal status.  Opportunity to meet with medical oncology were reviewed. Opportunity for second surgical opinion was discussed including at the university setting.  At this time she is leaning towards breast conservation. She brought up the possibility that a more extensive tumor might be identified necessitating mastectomy. I think this would be unlikely based on the present tumor/breast volume  ratio, but this would be kept in mind should she choose breast conservation and a more extensive tumor identified. The goal of adding a cosmetic procedure to a oncology procedure was again discussed, with cosmesis secondary to oncologic goals.  An informational brochure was provided. CEA and CA 27-29 were obtained today.  The patient will consider her options, and notify the office of how she would like to proceed.        Robert Bellow 04/08/2013, 8:49 PM

## 2013-04-09 LAB — PATHOLOGY

## 2013-04-09 LAB — CANCER ANTIGEN 27.29: CA 27.29: 22.9 U/mL (ref 0.0–38.6)

## 2013-04-09 LAB — CEA: CEA: 0.9 ng/mL (ref 0.0–4.7)

## 2013-04-10 ENCOUNTER — Encounter: Payer: BC Managed Care – PPO | Admitting: Podiatrist

## 2013-04-15 ENCOUNTER — Other Ambulatory Visit: Payer: Self-pay | Admitting: General Surgery

## 2013-04-15 ENCOUNTER — Telehealth: Payer: Self-pay | Admitting: *Deleted

## 2013-04-15 ENCOUNTER — Telehealth: Payer: Self-pay

## 2013-04-15 ENCOUNTER — Telehealth: Payer: Self-pay | Admitting: Internal Medicine

## 2013-04-15 DIAGNOSIS — C50211 Malignant neoplasm of upper-inner quadrant of right female breast: Secondary | ICD-10-CM

## 2013-04-15 NOTE — Telephone Encounter (Signed)
Patient called and said that she has decided to have the conservative breast surgery. She wants to have a Right breast lumpectomy with radiation to follow as discussed at her last visit. We will contact the patient to schedule the surgery once orders are received.

## 2013-04-15 NOTE — Telephone Encounter (Signed)
Attempted to call pt. Left message for her to call back if she had any questions or concerns about recent biopsy results.

## 2013-04-15 NOTE — Telephone Encounter (Signed)
Message copied by Carson Myrtle on Tue Apr 15, 2013  8:34 AM ------      Message from: Zeba, [place] W      Created: Mon Apr 14, 2013  3:12 PM       Contact the patient and see how see is coming in regards to breast cancer treatment. Thanks. ------

## 2013-04-16 ENCOUNTER — Telehealth: Payer: Self-pay | Admitting: *Deleted

## 2013-04-16 NOTE — Telephone Encounter (Signed)
Patient's surgery has been scheduled for 05-01-13 at [facility] (per patient request). It is okay for patient to continue 81 mg aspirin.  Message for patient to call the office back to review all instructions.

## 2013-04-16 NOTE — Telephone Encounter (Signed)
Message left on cell and work numbers for patient to call the office.  We need to arrange a surgery date.

## 2013-04-16 NOTE — Telephone Encounter (Signed)
Surgery scheduled for April 9th 2015. Pt aware of most recent labs. Postop care discussed. She states she has a history of allergy to contrast in 2983 when she had her kidney tested.

## 2013-04-17 ENCOUNTER — Ambulatory Visit (INDEPENDENT_AMBULATORY_CARE_PROVIDER_SITE_OTHER): Payer: BC Managed Care – PPO | Admitting: Podiatrist

## 2013-04-17 ENCOUNTER — Ambulatory Visit (INDEPENDENT_AMBULATORY_CARE_PROVIDER_SITE_OTHER): Payer: BC Managed Care – PPO

## 2013-04-17 VITALS — BP 125/90 | HR 106 | Resp 16

## 2013-04-17 DIAGNOSIS — Z9889 Other specified postprocedural states: Secondary | ICD-10-CM

## 2013-04-17 DIAGNOSIS — Z8781 Personal history of (healed) traumatic fracture: Secondary | ICD-10-CM

## 2013-04-17 DIAGNOSIS — Z1239 Encounter for other screening for malignant neoplasm of breast: Secondary | ICD-10-CM

## 2013-04-17 NOTE — Patient Instructions (Signed)
Ankle Surgeon-- Christie Ramirez-- Dr. Wylene Simmer at [facility]-- [phone]

## 2013-04-17 NOTE — Addendum Note (Signed)
Addended by: Robert Bellow on: 04/17/2013 09:33 AM   Modules accepted: Orders

## 2013-04-17 NOTE — Progress Notes (Signed)
   Subjective: Patient presents today 7 weeks status post foot surgery of the right foot. A Cheilectomy was performed at [facility]. Date of surgery 02-26-2013. States she's doing well with surgery and she is back to work at [employer] in [place].  She states her left ankle started bothering her and she sustained an ankle fracture this past July   Objective: Neurovascular status is intact with palpable pedal pulses DP and PT bilateral at 2+ out of 4. Neurological sensation is intact and unchanged as per prior to surgery. Excellent appearance of the postoperative foot is noted. Incision site is well coapted with suture line in place. Moderate swelling is present. Range of motion is improved as per prior to surgery. X-rays show bony exostosis have been removed surgically. Normal postoperative appearance is seen.  Left ankle is painful at the lateral fibula.  No swelling is seen clinically.  xrays show a fractured fibula that appears to have healed in a shortened position.  The lateral film shows a 42mm diastasis between the oblique fracture fragments and possible malunion seen.    Assessment: Status post cheilectomy of the right foot ; left ankle pain s/p ankle injury/fracture 9 months ago  Plan: she is to wear a compressive dressing on the right foot until the swelling resolves.  Range of motion is excellent and should continue to improve.  The left ankle was evaluated and I recommeded a second opinion the check on the healing of the fracture with Dr. Doran Durand.  She is getting ready to undergo treatment for breast CA and would like to hold off at this time.  Discussed the ankle pain may be more due to compensation of the right foot having surgery and it could possibly improve with more weight being placed on the right foot.  However, the xrays show shortened fibular segment which may need surgery.  i will see her again in 1 months for post op check and will see how the left ankle is  fairing.

## 2013-04-18 ENCOUNTER — Other Ambulatory Visit: Payer: Self-pay | Admitting: Adult Health

## 2013-04-22 ENCOUNTER — Telehealth: Payer: Self-pay | Admitting: *Deleted

## 2013-04-22 NOTE — Telephone Encounter (Signed)
Per Al Pimple, patient has been scheduled to see Dr[facility] Oliva [facility]ustard [facility] Tuesday, 04-29-13 at 8 am. Dr. Bary Castilla has been notified accordingly.

## 2013-04-23 ENCOUNTER — Telehealth: Payer: Self-pay | Admitting: *Deleted

## 2013-04-23 HISTORY — PX: BREAST LUMPECTOMY: SHX2

## 2013-04-23 NOTE — Telephone Encounter (Signed)
completed

## 2013-04-23 NOTE — Telephone Encounter (Signed)
FMLA papers

## 2013-04-24 ENCOUNTER — Encounter: Payer: Self-pay | Admitting: Podiatrist

## 2013-04-26 ENCOUNTER — Other Ambulatory Visit: Payer: Self-pay | Admitting: Internal Medicine

## 2013-04-28 ENCOUNTER — Telehealth: Payer: Self-pay | Admitting: Internal Medicine

## 2013-04-28 ENCOUNTER — Ambulatory Visit: Payer: Self-pay | Admitting: Oncology

## 2013-04-28 NOTE — Telephone Encounter (Signed)
Pt came into office to check status of refills.  States request was sent by pharmacy for losartan.  States there is another med but she does not know the name of it or what it is for.  States she goes to an appt Thursday for breast cancer and wants to ensure she has her medications before that time.

## 2013-04-29 MED ORDER — AMLODIPINE BESYLATE 5 MG PO TABS: ORAL_TABLET | ORAL | Status: DC

## 2013-04-29 MED ORDER — LOSARTAN POTASSIUM 100 MG PO TABS: ORAL_TABLET | ORAL | Status: DC

## 2013-04-29 NOTE — Telephone Encounter (Signed)
Prescriptions sent to pharmacy

## 2013-04-29 NOTE — Addendum Note (Signed)
Addended by: Robert Bellow on: 04/29/2013 03:10 PM   Modules accepted: Orders

## 2013-04-30 ENCOUNTER — Encounter: Payer: Self-pay | Admitting: Internal Medicine

## 2013-05-01 ENCOUNTER — Ambulatory Visit: Payer: Self-pay | Admitting: General Surgery

## 2013-05-01 DIAGNOSIS — C50211 Malignant neoplasm of upper-inner quadrant of right female breast: Secondary | ICD-10-CM

## 2013-05-01 DIAGNOSIS — C50219 Malignant neoplasm of upper-inner quadrant of unspecified female breast: Secondary | ICD-10-CM

## 2013-05-01 HISTORY — PX: BREAST SURGERY: SHX581

## 2013-05-01 HISTORY — DX: Malignant neoplasm of upper-inner quadrant of right female breast: C50.211

## 2013-05-03 LAB — PATHOLOGY REPORT

## 2013-05-05 ENCOUNTER — Encounter: Payer: Self-pay | Admitting: General Surgery

## 2013-05-05 ENCOUNTER — Telehealth: Payer: Self-pay | Admitting: General Surgery

## 2013-05-05 NOTE — Telephone Encounter (Signed)
Message left at the pathology report had been reviewed and was clear. We'll provide details a tomorrow's office visit.

## 2013-05-06 ENCOUNTER — Encounter: Payer: Self-pay | Admitting: General Surgery

## 2013-05-06 ENCOUNTER — Other Ambulatory Visit: Payer: BC Managed Care – PPO

## 2013-05-06 ENCOUNTER — Ambulatory Visit (INDEPENDENT_AMBULATORY_CARE_PROVIDER_SITE_OTHER): Payer: BC Managed Care – PPO | Admitting: General Surgery

## 2013-05-06 VITALS — BP 128/72 | HR 78 | Resp 14 | Ht 61.0 in | Wt 154.0 lb

## 2013-05-06 DIAGNOSIS — C50211 Malignant neoplasm of upper-inner quadrant of right female breast: Secondary | ICD-10-CM

## 2013-05-06 DIAGNOSIS — C50219 Malignant neoplasm of upper-inner quadrant of unspecified female breast: Secondary | ICD-10-CM

## 2013-05-06 NOTE — Patient Instructions (Addendum)
Patient to return in 1 month for follow up. The patient will be arrange to see Radiation Oncology. Continue self breast exams. Call office for any new breast issues or concerns.

## 2013-05-06 NOTE — Progress Notes (Signed)
Patient ID: Christie Ramirez, female   DOB: 19-Dec-1955, 58 y.o.   MRN: [id number]  Chief Complaint  Patient presents with  . Routine Post Op    right breast    HPI Christie Ramirez is a 58 y.o. female. here today for her post op right breast wide excision done on 05/01/13. Patient states she is doing well. The patient is accompanied by her housemate. She took her compressive wrap off on April 19 as requested. At present, she is very satisfied with cosmetic results.   Marland KitchenHPI  Past Medical History  Diagnosis Date  . Hyperlipidemia   . Hypothyroidism   . Anxiety   . Endometriosis 2009  . Cancer 1995    thyroid  . 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     Past Surgical History  Procedure Laterality Date  . Colectomy  2009    secondary to endometriosis DR. Smith  . Colonoscopy  2009    Dr. Tamala Julian  . Foot surgery  right  . Abdominal hysterectomy  2005    Martin DeFrancesco,MD  . Colon surgery  2009    Secondary to endometriosis.  Rochel Brome, MD  . Breast biopsy Right 04-02-13    INVASIVE MAMMARY CARCINOMA WITH FEATURES OF TUBULAR CARCINOMA,  . Breast surgery Right 05/01/13    wide excision    No family history on file.  Social History History  Substance Use Topics  . Smoking status: Never Smoker   . Smokeless tobacco: Never Used  . Alcohol Use: Yes     Comment: wine    Allergies  Allergen Reactions  . Codeine Anaphylaxis  . Ace Inhibitors   . Iodinated Diagnostic Agents Hives  . Penicillins Rash    Current Outpatient Prescriptions  Medication Sig Dispense Refill  . amLODipine (NORVASC) 5 MG tablet TAKE 1 TABLET BY MOUTH EVERY DAY  30 tablet  5  . aspirin 81 MG tablet Take 81 mg by mouth daily.      Marland Kitchen atorvastatin (LIPITOR) 40 MG tablet TAKE 1 TABLET BY MOUTH EVERY DAY  90 tablet  1  . clonazePAM (KLONOPIN) 1 MG tablet TAKE 1 TABLET BY MOUTH 3 TIMES A DAY AS  NEEDED  90 tablet  3  . escitalopram (LEXAPRO) 20 MG tablet TAKE 1 TABLET (20 MG TOTAL) BY MOUTH DAILY.  90 tablet  0  . escitalopram (LEXAPRO) 20 MG tablet TAKE 1 TABLET (20 MG TOTAL) BY MOUTH DAILY.  90 tablet  1  . levothyroxine (SYNTHROID, LEVOTHROID) 150 MCG tablet Take 1 tablet (150 mcg total) by mouth daily.  90 tablet  4  . losartan (COZAAR) 100 MG tablet TAKE 1 TABLET BY MOUTH EVERY DAY  30 tablet  5  . Multiple Vitamins-Minerals (MULTIVITAMIN WITH MINERALS) tablet Take 1 tablet by mouth daily.      . traMADol (ULTRAM) 50 MG tablet Take 50 mg by mouth daily.       Marland Kitchen zolpidem (AMBIEN) 10 MG tablet TAKE 1 TABLET AT BEDTIME AS NEEDED  30 tablet  0   No current facility-administered medications for this visit.    Review of Systems Review of Systems  Constitutional: Negative.   Respiratory: Negative.   Cardiovascular: Negative.     Blood pressure 128/72, pulse 78, resp. rate 14, height 5\' 1"  (1.549 m), weight 154 lb (69.854 kg).  Physical Exam Physical Exam  Constitutional: She is oriented to person,  place, and time. She appears well-developed and well-nourished.  Pulmonary/Chest:    Bruising from 10-5 o'clock on right breast.   Neurological: She is alert and oriented to person, place, and time.  Skin: Skin is warm and dry.    Data Reviewed Ultrasound examination of the right breast in the upper inner quadrant to assess for possibility of MammoSite placement shows a poorly defined seroma cavity measuring 1.6 x 2.8 x 2.9 cm. This is approximately 1.16 cm below the skin.  Assessment    T1 B. carcinoma of the right breast.  Candidate for a partial breast were whole breast radiation.    Plan    Arrangements have been made for evaluation by Noreene Filbert, M.D. from radiation oncology on Thursday, 05/08/2012. The radiation staff will contact the patient as to the time.  It is unlikely she will be a candidate for adjuvant chemotherapy. If she desires she can receive her  antiestrogen medication through this office or through the medical oncology service.    PCP and Ref MD: Ronette Deter MD   Forest Gleason Marise Knapper 05/07/2013, 4:28 PM

## 2013-05-07 ENCOUNTER — Encounter: Payer: Self-pay | Admitting: General Surgery

## 2013-05-07 ENCOUNTER — Telehealth: Payer: Self-pay | Admitting: *Deleted

## 2013-05-07 NOTE — Telephone Encounter (Signed)
Pt called and said that she would like to know when she could return back to work, she had a RT Breast Wide EX done on 05-01-13.

## 2013-05-07 NOTE — Telephone Encounter (Signed)
Message copied by Carson Myrtle on Wed May 07, 2013  3:56 PM ------      Message from: [place], [place]      Created: Wed May 07, 2013  3:53 PM       The patient may return to work at any time. ------

## 2013-05-08 ENCOUNTER — Telehealth: Payer: Self-pay | Admitting: General Surgery

## 2013-05-08 ENCOUNTER — Encounter: Payer: BC Managed Care – PPO | Admitting: Podiatrist

## 2013-05-08 ENCOUNTER — Encounter: Payer: Self-pay | Admitting: *Deleted

## 2013-05-08 NOTE — Progress Notes (Signed)
Return to work note completed

## 2013-05-08 NOTE — Telephone Encounter (Signed)
PATIENT CAME IN TODAY AFTER SEEING DR CHRYSTAL AND WOULD LIKE TO WORK NOTES TO RET TO WORK ON MONDAY 05-12-13 FOR 1/2 A DAY & THEN REGULAR DAYS. DOES SHE HAVE ANY LIMITATIONS? SHE WOULD ALSO LIKE TO KNOW IF SHE'S A CANDIDATE FOR MAMMOSITE  PLACEMENT? SHE'S PLANING TO COME Friday 05-09-13 TO PICK THEM UP/MTH

## 2013-05-13 ENCOUNTER — Other Ambulatory Visit: Payer: Self-pay | Admitting: *Deleted

## 2013-05-13 ENCOUNTER — Telehealth: Payer: Self-pay | Admitting: *Deleted

## 2013-05-13 MED ORDER — SULFAMETHOXAZOLE-TMP DS 800-160 MG PO TABS
1.0000 | ORAL_TABLET | Freq: Two times a day (BID) | ORAL | Status: DC
Start: 2013-05-13 — End: 2013-06-09

## 2013-05-13 NOTE — Telephone Encounter (Signed)
Mammosite schedule reviewed with the patient Placement  05-19-13 at 8:15      at [facility] Scan 05-21-13 Treat 05-22-13 to 05-28-13 Aware Christie Ramirez will be calling her for more details Aware of ATB and directions reviewed. Aware no showers and to wear her bra while mammosite in place. Pt agrees.

## 2013-05-18 ENCOUNTER — Telehealth: Payer: Self-pay | Admitting: General Surgery

## 2013-05-18 NOTE — Telephone Encounter (Signed)
Patient instructed, 11:30 AM instead of 8:15 AM for MammoSite balloon placement.

## 2013-05-19 ENCOUNTER — Encounter: Payer: Self-pay | Admitting: General Surgery

## 2013-05-19 ENCOUNTER — Other Ambulatory Visit: Payer: Self-pay | Admitting: Internal Medicine

## 2013-05-19 ENCOUNTER — Ambulatory Visit (INDEPENDENT_AMBULATORY_CARE_PROVIDER_SITE_OTHER): Payer: BC Managed Care – PPO | Admitting: General Surgery

## 2013-05-19 ENCOUNTER — Other Ambulatory Visit (INDEPENDENT_AMBULATORY_CARE_PROVIDER_SITE_OTHER): Payer: BC Managed Care – PPO

## 2013-05-19 ENCOUNTER — Other Ambulatory Visit: Payer: BC Managed Care – PPO | Admitting: General Surgery

## 2013-05-19 VITALS — BP 122/80 | HR 88 | Resp 14 | Ht 61.5 in | Wt 154.0 lb

## 2013-05-19 DIAGNOSIS — N63 Unspecified lump in unspecified breast: Secondary | ICD-10-CM

## 2013-05-19 DIAGNOSIS — C50211 Malignant neoplasm of upper-inner quadrant of right female breast: Secondary | ICD-10-CM

## 2013-05-19 DIAGNOSIS — C50919 Malignant neoplasm of unspecified site of unspecified female breast: Secondary | ICD-10-CM

## 2013-05-19 NOTE — Progress Notes (Signed)
Patient ID: Christie Ramirez, female   DOB: 12-Nov-1955, 58 y.o.   MRN: [id number]  Chief Complaint  Patient presents with  . Procedure    right breast mammosite placement    HPI Christie Ramirez is a 58 y.o. female who presents for a right breast mammosite placement. The patient has been evaluated by radiation oncology and found to be a acceptable candidate for accelerated partial breast radiation. No new problems at this time.    HPI  Past Medical History  Diagnosis Date  . Hyperlipidemia   . Hypothyroidism   . Anxiety   . Endometriosis 2009  . Cancer 1995    thyroid  . 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     Past Surgical History  Procedure Laterality Date  . Colectomy  2009    secondary to endometriosis DR. Smith  . Colonoscopy  2009    Dr. Tamala Julian  . Foot surgery  right  . Abdominal hysterectomy  2005    Martin DeFrancesco,MD  . Colon surgery  2009    Secondary to endometriosis.  Rochel Brome, MD  . Breast biopsy Right 04-02-13    INVASIVE MAMMARY CARCINOMA WITH FEATURES OF TUBULAR CARCINOMA,  . Breast surgery Right 05/01/13    wide excision    History reviewed. No pertinent family history.  Social History History  Substance Use Topics  . Smoking status: Never Smoker   . Smokeless tobacco: Never Used  . Alcohol Use: Yes     Comment: wine    Allergies  Allergen Reactions  . Codeine Anaphylaxis  . Ace Inhibitors   . Iodinated Diagnostic Agents Hives  . Penicillins Rash    Current Outpatient Prescriptions  Medication Sig Dispense Refill  . amLODipine (NORVASC) 5 MG tablet TAKE 1 TABLET BY MOUTH EVERY DAY  30 tablet  5  . aspirin 81 MG tablet Take 81 mg by mouth daily.      Marland Kitchen atorvastatin (LIPITOR) 40 MG tablet TAKE 1 TABLET BY MOUTH EVERY DAY  90 tablet  1  . clonazePAM (KLONOPIN) 1 MG tablet TAKE 1 TABLET BY MOUTH 3 TIMES A DAY AS NEEDED  90  tablet  3  . escitalopram (LEXAPRO) 20 MG tablet TAKE 1 TABLET (20 MG TOTAL) BY MOUTH DAILY.  90 tablet  0  . escitalopram (LEXAPRO) 20 MG tablet TAKE 1 TABLET (20 MG TOTAL) BY MOUTH DAILY.  90 tablet  1  . levothyroxine (SYNTHROID, LEVOTHROID) 150 MCG tablet Take 1 tablet (150 mcg total) by mouth daily.  90 tablet  4  . losartan (COZAAR) 100 MG tablet TAKE 1 TABLET BY MOUTH EVERY DAY  30 tablet  5  . Multiple Vitamins-Minerals (MULTIVITAMIN WITH MINERALS) tablet Take 1 tablet by mouth daily.      Marland Kitchen sulfamethoxazole-trimethoprim (BACTRIM DS) 800-160 MG per tablet Take 1 tablet by mouth 2 (two) times daily. Start antibiotic one hour prior to procedure  20 tablet  0  . traMADol (ULTRAM) 50 MG tablet Take 50 mg by mouth daily.       Marland Kitchen zolpidem (AMBIEN) 10 MG tablet TAKE 1 TABLET BY MOUTH AT BEDTIME AS NEEDED  30 tablet  1   No current facility-administered medications for this visit.    Review of Systems Review of Systems  Constitutional: Negative.   Respiratory: Negative.   Cardiovascular: Negative.     Blood pressure 122/80, pulse 88, resp. rate 14,  height 5' 1.5" (1.562 m), weight 154 lb (69.854 kg).  Physical Exam Physical Exam  Pulmonary/Chest:      Data Reviewed Ultrasound examination showed a adequate seroma cavity approximately 1.9 cm from the skin in the upper quadrant of the right breast. The patient was amenable to proceed with balloon placement.  10 cc of 0.5% Xylocaine with 0.25% Marcaine with 1-200,000 of epinephrine was utilized well-tolerated. Chlor prep was applied to the skin. An 8 mm trocar was used to access the seroma cavity yielding about 15-20 cc of old seroma.  The cavity evaluation device was placed under ultrasound guidance and inflated to 35 mm. The patient had fairly significant discomfort during initial inflation.  The balloon was removed and replaced with the treatment balloon. This was inflated with 35 cc of a mixture of Omnipaque and saline. The  balloon was found to be symmetrical prior to implantation and was symmetrical on imaging post implantation. Normal skin distance 1.33 cm. The patient experienced minimal discomfort during inflation with the treatment balloon. (Lot [id number], reference [id number].)  Bacitracin was applied to the exit site and a dry dressing placed. The patient was pain free when she left the office.  Assessment    Stage I carcinoma of the right breast.     Plan    The patient will followup with Dr. Baruch Gouty 4 post implant imaging on Wednesday, April 29. We'll plan for office followup. 3 weeks. Will discuss further treatment planning after she has completed her radiation treatment.      PCP: Bess Harvest Chael Urenda 05/19/2013, 9:05 PM

## 2013-05-19 NOTE — Telephone Encounter (Signed)
Last visit 02/18/13, refill?

## 2013-05-22 ENCOUNTER — Ambulatory Visit: Payer: BC Managed Care – PPO | Admitting: Podiatrist

## 2013-05-23 ENCOUNTER — Ambulatory Visit: Payer: Self-pay | Admitting: Oncology

## 2013-05-29 ENCOUNTER — Ambulatory Visit: Payer: BC Managed Care – PPO | Admitting: Podiatrist

## 2013-06-04 ENCOUNTER — Ambulatory Visit: Payer: BC Managed Care – PPO | Admitting: General Surgery

## 2013-06-05 ENCOUNTER — Ambulatory Visit: Payer: BC Managed Care – PPO | Admitting: Podiatrist

## 2013-06-09 ENCOUNTER — Ambulatory Visit (INDEPENDENT_AMBULATORY_CARE_PROVIDER_SITE_OTHER): Payer: BC Managed Care – PPO | Admitting: Internal Medicine

## 2013-06-09 ENCOUNTER — Encounter: Payer: Self-pay | Admitting: Internal Medicine

## 2013-06-09 VITALS — BP 122/86 | HR 90 | Temp 97.7°F | Wt 153.0 lb

## 2013-06-09 DIAGNOSIS — F32A Depression, unspecified: Secondary | ICD-10-CM

## 2013-06-09 DIAGNOSIS — F329 Major depressive disorder, single episode, unspecified: Secondary | ICD-10-CM

## 2013-06-09 DIAGNOSIS — Z780 Asymptomatic menopausal state: Secondary | ICD-10-CM

## 2013-06-09 DIAGNOSIS — C50219 Malignant neoplasm of upper-inner quadrant of unspecified female breast: Secondary | ICD-10-CM

## 2013-06-09 DIAGNOSIS — G4700 Insomnia, unspecified: Secondary | ICD-10-CM

## 2013-06-09 DIAGNOSIS — F3289 Other specified depressive episodes: Secondary | ICD-10-CM

## 2013-06-09 DIAGNOSIS — C50211 Malignant neoplasm of upper-inner quadrant of right female breast: Secondary | ICD-10-CM

## 2013-06-09 MED ORDER — CLONAZEPAM 1 MG PO TABS: ORAL_TABLET | ORAL | Status: DC

## 2013-06-09 MED ORDER — ZOLPIDEM TARTRATE 10 MG PO TABS: ORAL_TABLET | ORAL | Status: DC

## 2013-06-09 NOTE — Assessment & Plan Note (Signed)
S/p lumpectomy and radiation. Doing well physically, however having worsening symptoms of depression/PTS after recent events. Offered support today and set up counseling. Continue to follow with Dr. Bary Castilla and Dr. Jeb Levering. Continue Femara.

## 2013-06-09 NOTE — Assessment & Plan Note (Signed)
Worsening symptoms of depression with some features of PTSD after recent breast cancer diagnosis and treatment. Offered support today. Will set up counseling. Continue Lexapro. Follow up in 3 months and prn.

## 2013-06-09 NOTE — Progress Notes (Signed)
Subjective:    Patient ID: Christie Ramirez, female    DOB: 03/23/1955, 58 y.o.   MRN: [id number]  HPI 58YO female presents for follow up.  Breast cancer - s/p lumpectomy and XRT right breast. On Femara, feeling exhausted. Some diffuse aching bony pain on this medication. Still having some soreness in right breast, keeps her awake at night. Has follow up with Dr. Bary Castilla tomorrow. Participating in [facility] massage at [facility], Vivi Ferns. Notes increased symptoms of depression, with frequent tearfulness over last few days. Continues on Lexapro. Has strong support from friends and at work. Would like to consider counseling.  Review of Systems  Constitutional: Negative for fever, chills, appetite change, fatigue and unexpected weight change.  HENT: Negative for congestion, ear pain, sinus pressure, sore throat, trouble swallowing and voice change.   Eyes: Negative for visual disturbance.  Respiratory: Negative for cough, shortness of breath, wheezing and stridor.   Cardiovascular: Negative for chest pain, palpitations and leg swelling.  Gastrointestinal: Negative for nausea, vomiting, abdominal pain, diarrhea, constipation, blood in stool, abdominal distention and anal bleeding.  Genitourinary: Negative for dysuria and flank pain.  Musculoskeletal: Positive for arthralgias and myalgias. Negative for gait problem and neck pain.  Skin: Negative for color change and rash.  Neurological: Negative for dizziness and headaches.  Hematological: Negative for adenopathy. Does not bruise/bleed easily.  Psychiatric/Behavioral: Positive for sleep disturbance and dysphoric mood. Negative for suicidal ideas. The patient is not nervous/anxious.        Objective:    BP 122/86  Pulse 90  Temp(Src) 97.7 F (36.5 C) (Oral)  Wt 153 lb (69.4 kg)  SpO2 95% Physical Exam  Constitutional: She is oriented to person, place, and time. She appears well-developed and well-nourished. No distress.  HENT:    Head: Normocephalic and atraumatic.  Right Ear: External ear normal.  Left Ear: External ear normal.  Nose: Nose normal.  Mouth/Throat: Oropharynx is clear and moist. No oropharyngeal exudate.  Eyes: Conjunctivae are normal. Pupils are equal, round, and reactive to light. Right eye exhibits no discharge. Left eye exhibits no discharge. No scleral icterus.  Neck: Normal range of motion. Neck supple. No tracheal deviation present. No thyromegaly present.  Cardiovascular: Normal rate, regular rhythm, normal heart sounds and intact distal pulses.  Exam reveals no gallop and no friction rub.   No murmur heard. Pulmonary/Chest: Effort normal and breath sounds normal. No accessory muscle usage. Not tachypneic. No respiratory distress. She has no decreased breath sounds. She has no wheezes. She has no rhonchi. She has no rales. She exhibits no tenderness.    Musculoskeletal: Normal range of motion. She exhibits no edema and no tenderness.  Lymphadenopathy:    She has no cervical adenopathy.  Neurological: She is alert and oriented to person, place, and time. No cranial nerve deficit. She exhibits normal muscle tone. Coordination normal.  Skin: Skin is warm and dry. No rash noted. She is not diaphoretic. No erythema. No pallor.  Psychiatric: She has a normal mood and affect. Her behavior is normal. Judgment and thought content normal.          Assessment & Plan:   Problem List Items Addressed This Visit   Breast cancer of upper-inner quadrant of right female breast - Primary     S/p lumpectomy and radiation. Doing well physically, however having worsening symptoms of depression/PTS after recent events. Offered support today and set up counseling. Continue to follow with Dr. Bary Castilla and Dr. Jeb Levering. Continue Femara.  Relevant Medications      letrozole (FEMARA) 2.5 MG tablet      clonazePAM (KLONOPIN)  tablet   Depression     Worsening symptoms of depression with some features of PTSD  after recent breast cancer diagnosis and treatment. Offered support today. Will set up counseling. Continue Lexapro. Follow up in 3 months and prn.    Relevant Orders      Ambulatory referral to Psychology   Insomnia     Multifactorial with depression, right breast pain. Will continue Ambien. Encouraged use of Tramadol if any breast pain at night. Follow up if symptoms are not improving.     Other Visit Diagnoses   Postmenopausal estrogen deficiency        Relevant Orders       DG Bone Density        Return in about 3 months (around 09/09/2013) for Recheck.

## 2013-06-09 NOTE — Progress Notes (Signed)
Pre visit review using our clinic review tool, if applicable. No additional management support is needed unless otherwise documented below in the visit note. 

## 2013-06-09 NOTE — Assessment & Plan Note (Signed)
Multifactorial with depression, right breast pain. Will continue Ambien. Encouraged use of Tramadol if any breast pain at night. Follow up if symptoms are not improving.

## 2013-06-10 ENCOUNTER — Ambulatory Visit: Payer: BC Managed Care – PPO | Admitting: General Surgery

## 2013-06-10 ENCOUNTER — Other Ambulatory Visit (INDEPENDENT_AMBULATORY_CARE_PROVIDER_SITE_OTHER): Payer: BC Managed Care – PPO

## 2013-06-10 VITALS — BP 132/84 | HR 86 | Resp 12 | Ht 61.0 in | Wt 155.0 lb

## 2013-06-10 DIAGNOSIS — C50211 Malignant neoplasm of upper-inner quadrant of right female breast: Secondary | ICD-10-CM

## 2013-06-10 DIAGNOSIS — C50219 Malignant neoplasm of upper-inner quadrant of unspecified female breast: Secondary | ICD-10-CM

## 2013-06-10 NOTE — Progress Notes (Signed)
Patient ID: Christie Ramirez, female   DOB: 1955/01/30, 57 y.o.   MRN: [id number]  Chief Complaint  Patient presents with  . Follow-up    mammosite    HPI Christie Ramirez is a 58 y.o. female.  Here today for follow up right breast mammosite done 05-19-13. Patient states she is doing well.  The patient tolerated her partial breast radiation without ill effect. She does report some mild fullness sensation at the wide excision site.  She has been started on [employer] by Forest Gleason, M.D. In medical oncology.    HPI  Past Medical History  Diagnosis Date  . Hyperlipidemia   . Hypothyroidism   . Anxiety   . Endometriosis 2009  . Cancer 1995    thyroid  . 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     Past Surgical History  Procedure Laterality Date  . Colectomy  2009    secondary to endometriosis DR. Smith  . Colonoscopy  2009    Dr. Tamala Julian  . Foot surgery  right  . Abdominal hysterectomy  2005    Martin DeFrancesco,MD  . Colon surgery  2009    Secondary to endometriosis.  Rochel Brome, MD  . Breast biopsy Right 04-02-13    INVASIVE MAMMARY CARCINOMA WITH FEATURES OF TUBULAR CARCINOMA,  . Breast surgery Right 05/01/13    wide excision    No family history on file.  Social History History  Substance Use Topics  . Smoking status: Never Smoker   . Smokeless tobacco: Never Used  . Alcohol Use: Yes     Comment: wine    Allergies  Allergen Reactions  . Codeine Anaphylaxis  . Ace Inhibitors   . Iodinated Diagnostic Agents Hives  . Penicillins Rash    Current Outpatient Prescriptions  Medication Sig Dispense Refill  . amLODipine (NORVASC) 5 MG tablet TAKE 1 TABLET BY MOUTH EVERY DAY  30 tablet  5  . aspirin 81 MG tablet Take 81 mg by mouth daily.      Marland Kitchen atorvastatin (LIPITOR) 40 MG tablet TAKE 1 TABLET BY MOUTH EVERY DAY  90 tablet  1  . clonazePAM (KLONOPIN) 1 MG  tablet TAKE 1 TABLET BY MOUTH 3 TIMES A DAY AS NEEDED  90 tablet  3  . escitalopram (LEXAPRO) 20 MG tablet TAKE 1 TABLET (20 MG TOTAL) BY MOUTH DAILY.  90 tablet  0  . letrozole (FEMARA) 2.5 MG tablet Take 2.5 tablets by mouth once. Once daily at bedtime      . levothyroxine (SYNTHROID, LEVOTHROID) 150 MCG tablet Take 1 tablet (150 mcg total) by mouth daily.  90 tablet  4  . losartan (COZAAR) 100 MG tablet TAKE 1 TABLET BY MOUTH EVERY DAY  30 tablet  5  . Multiple Vitamins-Minerals (MULTIVITAMIN WITH MINERALS) tablet Take 1 tablet by mouth daily.      Marland Kitchen zolpidem (AMBIEN) 10 MG tablet TAKE 1 TABLET BY MOUTH AT BEDTIME AS NEEDED  30 tablet  4   No current facility-administered medications for this visit.    Review of Systems Review of Systems  Constitutional: Negative.   Respiratory: Negative.   Cardiovascular: Negative.     Blood pressure 132/84, pulse 86, resp. rate 12, height 5\' 1"  (1.549 m), weight 155 lb (70.308 kg).  Physical Exam Physical Exam  Constitutional: She is oriented to person, place, and time. She appears well-developed and well-nourished.  Eyes:  Conjunctivae are normal. No scleral icterus.  Neck: Neck supple.  Cardiovascular: Normal rate, regular rhythm and normal heart sounds.   Pulmonary/Chest: Effort normal and breath sounds normal.    Neurological: She is alert and oriented to person, place, and time.  Skin: Skin is warm and dry.    Data Reviewed Ultrasound examination of the wide excision site was completed, anticipating a seroma had formed at the balloon site reducing aspiration of which would reduce the patient's present discomfort. Examination showed a 2.0 x 2.3 x 3.2 cm fluid collection inferior to the central portion of the wound. More superficially located in the area of the mastoplasty was a 0.5 x 2.8 x 3.1 cm fluid collection.  The patient was amenable to aspiration of the deeper collection for pressure relief. This was completed making use of poor  prep for the skin followed by 1 cc of 1% plain Xylocaine. 18 cc of clear straw-colored fluid was obtained. After aspiration and mild depression was noted between the wide excision scar and the areola.  Assessment    Doing well status post wide excision, sentinel node biopsy followed by partial breast radiation.  Good tolerance of Letrazol therapy.     Plan    We'll arrange for follow up examination one month. I expect some recurrent fluid to occur and this may help smooth out the wound depression for fluid was aspirated today. The patient was encouraged to call should she become symptomatic with pressure before this.      PCP: Bess Harvest Byrnett 06/10/2013, 6:37 PM

## 2013-06-10 NOTE — Patient Instructions (Signed)
Patient to return in one month. 

## 2013-06-12 ENCOUNTER — Ambulatory Visit: Payer: BC Managed Care – PPO | Admitting: Podiatrist

## 2013-06-23 ENCOUNTER — Ambulatory Visit: Payer: Self-pay | Admitting: Oncology

## 2013-07-02 ENCOUNTER — Telehealth: Payer: Self-pay | Admitting: Internal Medicine

## 2013-07-02 NOTE — Telephone Encounter (Signed)
No answer, unable to leave a message. °

## 2013-07-02 NOTE — Telephone Encounter (Signed)
The patient is needing a skin specialist in [place] and a Financial planner for the lost of her breast.

## 2013-07-07 NOTE — Telephone Encounter (Signed)
I need to have more information about what the "skin specialist" is. Please set her up an appointment this week.

## 2013-07-09 NOTE — Telephone Encounter (Signed)
Left vm on cell requesting pt to return my call

## 2013-07-10 ENCOUNTER — Ambulatory Visit (INDEPENDENT_AMBULATORY_CARE_PROVIDER_SITE_OTHER): Payer: BC Managed Care – PPO

## 2013-07-10 ENCOUNTER — Telehealth: Payer: Self-pay | Admitting: *Deleted

## 2013-07-10 ENCOUNTER — Encounter: Payer: Self-pay | Admitting: *Deleted

## 2013-07-10 ENCOUNTER — Ambulatory Visit (INDEPENDENT_AMBULATORY_CARE_PROVIDER_SITE_OTHER): Payer: BC Managed Care – PPO | Admitting: Podiatrist

## 2013-07-10 VITALS — BP 120/81 | HR 90 | Resp 16

## 2013-07-10 DIAGNOSIS — Z9889 Other specified postprocedural states: Secondary | ICD-10-CM

## 2013-07-10 DIAGNOSIS — M205X9 Other deformities of toe(s) (acquired), unspecified foot: Secondary | ICD-10-CM

## 2013-07-10 NOTE — Patient Instructions (Signed)
Pull out on the toe and pull up-- do this 5 times and hold for 15 seconds-- repeat this cycle 3 times per day

## 2013-07-10 NOTE — Telephone Encounter (Signed)
Opened in error

## 2013-07-10 NOTE — Progress Notes (Signed)
   Subjective:    Patient ID: Christie Ramirez, female    DOB: February 08, 1955, 58 y.o.   MRN: [id number]  HPI    Review of Systems     Objective:   Physical Exam        Assessment & Plan:

## 2013-07-10 NOTE — Progress Notes (Signed)
aspirar pharmacy called and said in order for insurance to cover #8 (bvd) tendinosis-strictures-scarring baclofen 2%-diclofenac 3%-verapamil 10% amantadine would have to be taken out. i did give the ok to approve that and for it to be taken out.

## 2013-07-10 NOTE — Progress Notes (Signed)
Subjective: Christie Ramirez presents today for followup of cheilectomy right foot. She states it feels okay however he continues to swell. She also has some pain on the lateral left ankle and she would like an x-ray of this as well. Since I saw her last she was diagnosed with breast cancer and underwent a lumpectomy, radiation, and now is on medication. Overall she states she's doing well.  Objective: Neurovascular status is intact and unchanged. She has swelling locally around the first metatarsophalangeal joint right. X-ray show swelling within the joint and plantarflexion of the hallux at the first metatarsal head. Clinically the toe is in a rectus position and dorsiflex as well. It can be distracted distally and more dorsiflexion is achieved. No pain with this maneuver is noted. X-rays show a healed fracture on the lateral ankle at the fibula left. Light swelling clinically is also seen  Assessment: Postoperative swelling first metatarsophalangeal joint right, healed fracture of the fibula left Plan: Recommended distraction range of motion exercises for Christie Ramirez to perform daily. Also recommended a topical medication for the swelling. Discussed injection therapy to help with the swelling however she cannot take cortisone injections due to her previous bad reaction. I will see her back in 2 months for a followup check and if she has any concerns or problems prior that visit she is instructed to call.

## 2013-07-11 NOTE — Telephone Encounter (Signed)
I see the dermatology referral that was placed in January. She should be able to call [facility] and reschedule her visit. The same referral will apply.

## 2013-07-11 NOTE — Telephone Encounter (Signed)
Notified pt. 

## 2013-07-11 NOTE — Telephone Encounter (Signed)
Pt states that she had a dermatology referral completed by you in [place], she had to cancel the appt that was scheduled with Dr  Adolph Pollack due to her breast cancer. Pt states that she now needs for the same referral to be resubmitted, she says that you and her talked about this at her last appt.  I advised pt that her nurse navigator would be a person that could help her with the grievance counselor.

## 2013-07-16 ENCOUNTER — Ambulatory Visit: Payer: BC Managed Care – PPO | Admitting: General Surgery

## 2013-07-22 ENCOUNTER — Other Ambulatory Visit: Payer: BC Managed Care – PPO

## 2013-07-22 ENCOUNTER — Telehealth: Payer: Self-pay | Admitting: Internal Medicine

## 2013-07-22 ENCOUNTER — Ambulatory Visit: Payer: BC Managed Care – PPO | Admitting: Adult Health

## 2013-07-22 ENCOUNTER — Encounter: Payer: Self-pay | Admitting: Internal Medicine

## 2013-07-22 ENCOUNTER — Ambulatory Visit (INDEPENDENT_AMBULATORY_CARE_PROVIDER_SITE_OTHER): Payer: BC Managed Care – PPO | Admitting: Internal Medicine

## 2013-07-22 VITALS — BP 124/86 | HR 104 | Temp 98.5°F | Ht 61.0 in | Wt 155.8 lb

## 2013-07-22 DIAGNOSIS — R5383 Other fatigue: Secondary | ICD-10-CM

## 2013-07-22 DIAGNOSIS — H65199 Other acute nonsuppurative otitis media, unspecified ear: Secondary | ICD-10-CM

## 2013-07-22 DIAGNOSIS — R5381 Other malaise: Secondary | ICD-10-CM

## 2013-07-22 DIAGNOSIS — H65191 Other acute nonsuppurative otitis media, right ear: Secondary | ICD-10-CM | POA: Insufficient documentation

## 2013-07-22 MED ORDER — AZITHROMYCIN 250 MG PO TABS: ORAL_TABLET | ORAL | Status: DC

## 2013-07-22 NOTE — Addendum Note (Signed)
Addended by: Karlene Einstein D on: 07/22/2013 03:58 PM   Modules accepted: Orders

## 2013-07-22 NOTE — Assessment & Plan Note (Signed)
Exam consistent with ROM. Will start Azithromycin. Ibuprofen prn pain. Follow up for recheck in 3 weeks or sooner if symptoms are not improving.

## 2013-07-22 NOTE — Telephone Encounter (Signed)
Pt dropped off letter that she has been getting for a bill. Pt was told by dr. Gilford Rile to bring it in. Letter is in Dr. Thomes Dinning box.msn

## 2013-07-22 NOTE — Progress Notes (Signed)
Pre visit review using our clinic review tool, if applicable. No additional management support is needed unless otherwise documented below in the visit note. 

## 2013-07-22 NOTE — Assessment & Plan Note (Signed)
Generalized fatigue after recent XRT. Will check CBC and TSH with labs.

## 2013-07-22 NOTE — Progress Notes (Signed)
Subjective:    Patient ID: Christie Ramirez, female    DOB: 02-23-1955, 58 y.o.   MRN: [id number]  HPI 58YO female presents for acute visit.  Woke up this morning with severe throat pain, ear pain. Hurts to swallow. No fever, chills. Occasional congestion in the mornings. Not taking anything for this.  Also feeling generally tired since completing radiation therapy for breast cancer in May 2015. No focal symptoms. Has started back at work, but struggling to keep up. No change in appetite, bowel habits. No dyspnea, chest pain.  Review of Systems  Constitutional: Positive for fatigue. Negative for fever, chills and unexpected weight change.  HENT: Positive for congestion, ear pain and sore throat. Negative for ear discharge, facial swelling, hearing loss, mouth sores, nosebleeds, postnasal drip, rhinorrhea, sinus pressure, sneezing, tinnitus, trouble swallowing and voice change.   Eyes: Negative for pain, discharge, redness and visual disturbance.  Respiratory: Negative for cough, chest tightness, shortness of breath, wheezing and stridor.   Cardiovascular: Negative for chest pain, palpitations and leg swelling.  Musculoskeletal: Negative for arthralgias, myalgias, neck pain and neck stiffness.  Skin: Negative for color change and rash.  Neurological: Negative for dizziness, weakness, light-headedness and headaches.  Hematological: Negative for adenopathy.       Objective:    BP 124/86  Pulse 104  Temp(Src) 98.5 F (36.9 C) (Oral)  Ht 5\' 1"  (1.549 m)  Wt 155 lb 12 oz (70.648 kg)  BMI 29.44 kg/m2  SpO2 95% Physical Exam  Constitutional: She is oriented to person, place, and time. She appears well-developed and well-nourished. No distress.  HENT:  Head: Normocephalic and atraumatic.  Right Ear: External ear normal. Tympanic membrane is erythematous and bulging. A middle ear effusion is present.  Left Ear: Tympanic membrane and external ear normal.  Nose: Nose normal.    Mouth/Throat: Posterior oropharyngeal erythema present. No oropharyngeal exudate.  Eyes: Conjunctivae are normal. Pupils are equal, round, and reactive to light. Right eye exhibits no discharge. Left eye exhibits no discharge. No scleral icterus.  Neck: Normal range of motion. Neck supple. No tracheal deviation present. No thyromegaly present.  Cardiovascular: Normal rate, regular rhythm, normal heart sounds and intact distal pulses.  Exam reveals no gallop and no friction rub.   No murmur heard. Pulmonary/Chest: Effort normal and breath sounds normal. No accessory muscle usage. Not tachypneic. No respiratory distress. She has no decreased breath sounds. She has no wheezes. She has no rhonchi. She has no rales. She exhibits no tenderness.  Musculoskeletal: Normal range of motion. She exhibits no edema and no tenderness.  Lymphadenopathy:    She has no cervical adenopathy.  Neurological: She is alert and oriented to person, place, and time. No cranial nerve deficit. She exhibits normal muscle tone. Coordination normal.  Skin: Skin is warm and dry. No rash noted. She is not diaphoretic. No erythema. No pallor.  Psychiatric: She has a normal mood and affect. Her behavior is normal. Judgment and thought content normal.          Assessment & Plan:   Problem List Items Addressed This Visit     Unprioritized   Acute nonsuppurative otitis media of right ear - Primary     Exam consistent with ROM. Will start Azithromycin. Ibuprofen prn pain. Follow up for recheck in 3 weeks or sooner if symptoms are not improving.    Relevant Medications      azithromycin (ZITHROMAX) tablet   Other malaise and fatigue     Generalized fatigue  after recent XRT. Will check CBC and TSH with labs.    Relevant Orders      CBC with Differential      TSH      Comprehensive metabolic panel       Return in about 3 weeks (around 08/12/2013) for Recheck.

## 2013-07-22 NOTE — Patient Instructions (Signed)
Start Azithromycin 500mg  today, then 250mg  daily on days 2-5.  Use Ibuprofen as needed, 600mg  three times daily, for ear pain.  Follow up for recheck in 3 weeks.

## 2013-07-23 LAB — CBC WITH DIFFERENTIAL/PLATELET
BASOS ABS: 0.1 10*3/uL (ref 0.0–0.1)
BASOS PCT: 0.7 % (ref 0.0–3.0)
EOS ABS: 0.1 10*3/uL (ref 0.0–0.7)
Eosinophils Relative: 1.1 % (ref 0.0–5.0)
HEMATOCRIT: 37 % (ref 36.0–46.0)
Hemoglobin: 12.4 g/dL (ref 12.0–15.0)
LYMPHS ABS: 1.9 10*3/uL (ref 0.7–4.0)
Lymphocytes Relative: 20.8 % (ref 12.0–46.0)
MCHC: 33.6 g/dL (ref 30.0–36.0)
MCV: 88.3 fl (ref 78.0–100.0)
MONO ABS: 0.4 10*3/uL (ref 0.1–1.0)
Monocytes Relative: 4.7 % (ref 3.0–12.0)
NEUTROS ABS: 6.5 10*3/uL (ref 1.4–7.7)
Neutrophils Relative %: 72.7 % (ref 43.0–77.0)
Platelets: 322 10*3/uL (ref 150.0–400.0)
RBC: 4.19 Mil/uL (ref 3.87–5.11)
RDW: 13.9 % (ref 11.5–15.5)
WBC: 9 10*3/uL (ref 4.0–10.5)

## 2013-07-23 LAB — COMPREHENSIVE METABOLIC PANEL
ALK PHOS: 85 U/L (ref 39–117)
ALT: 37 U/L — ABNORMAL HIGH (ref 0–35)
AST: 22 U/L (ref 0–37)
Albumin: 4.1 g/dL (ref 3.5–5.2)
BILIRUBIN TOTAL: 1.1 mg/dL (ref 0.2–1.2)
BUN: 18 mg/dL (ref 6–23)
CO2: 28 mEq/L (ref 19–32)
Calcium: 9.6 mg/dL (ref 8.4–10.5)
Chloride: 104 mEq/L (ref 96–112)
Creatinine, Ser: 0.7 mg/dL (ref 0.4–1.2)
GFR: 88.78 mL/min (ref >60.00)
GLUCOSE: 100 mg/dL — AB (ref 70–99)
Potassium: 4.3 mEq/L (ref 3.5–5.1)
SODIUM: 141 meq/L (ref 135–145)
Total Protein: 7 g/dL (ref 6.0–8.3)

## 2013-07-23 LAB — TSH: TSH: 0.24 u[IU]/mL — ABNORMAL LOW (ref 0.35–4.50)

## 2013-07-24 ENCOUNTER — Encounter: Payer: Self-pay | Admitting: Internal Medicine

## 2013-07-24 ENCOUNTER — Ambulatory Visit (INDEPENDENT_AMBULATORY_CARE_PROVIDER_SITE_OTHER): Payer: BC Managed Care – PPO | Admitting: Internal Medicine

## 2013-07-24 VITALS — BP 112/72 | HR 104 | Temp 98.2°F | Ht 61.0 in | Wt 153.5 lb

## 2013-07-24 DIAGNOSIS — H65191 Other acute nonsuppurative otitis media, right ear: Secondary | ICD-10-CM

## 2013-07-24 DIAGNOSIS — H65199 Other acute nonsuppurative otitis media, unspecified ear: Secondary | ICD-10-CM

## 2013-07-24 LAB — HER-2 / NEU, FISH
Avg Num CEP17 probes/nucleus:: 1.9
Avg Num Her-2 signals/nucleus:: 2
HER-2/CEP17 RATIO: 1.08
NUMBER OF TUMOR CELLS COUNTED: 40
Number of Observers:: 2

## 2013-07-24 LAB — ER/PR,IMMUNOHISTOCHEM,PARAFFIN
Estrogen Receptor IHC: >90 %
Progesterone Recp IP: >90 %

## 2013-07-24 NOTE — Patient Instructions (Signed)
Continue Azithromycin.  Rest, increase fluid intake.  Call if any fever, chills, worsening symptoms.  Return to clinic in 4 weeks to recheck right ear.

## 2013-07-24 NOTE — Progress Notes (Signed)
   Subjective:    Patient ID: Christie Ramirez, female    DOB: 1955/03/21, 58 y.o.   MRN: [id number]  HPI 58YO female presents for follow up.  Continues to have sore throat, some chest tightness with non-productive cough. Mild dyspnea on occasion. No fever. Occasional chills. Energy level is better today. Has been working from home.  Review of Systems  Constitutional: Positive for chills and fatigue. Negative for fever and unexpected weight change.  HENT: Positive for congestion, postnasal drip, rhinorrhea and sore throat. Negative for ear discharge, ear pain, facial swelling, hearing loss, mouth sores, nosebleeds, sinus pressure, sneezing, tinnitus, trouble swallowing and voice change.   Eyes: Negative for pain, discharge, redness and visual disturbance.  Respiratory: Positive for cough and shortness of breath. Negative for chest tightness, wheezing and stridor.   Cardiovascular: Negative for chest pain, palpitations and leg swelling.  Musculoskeletal: Negative for arthralgias, myalgias, neck pain and neck stiffness.  Skin: Negative for color change and rash.  Neurological: Negative for dizziness, weakness, light-headedness and headaches.  Hematological: Negative for adenopathy.       Objective:    BP 112/72  Pulse 104  Temp(Src) 98.2 F (36.8 C) (Oral)  Ht 5\' 1"  (1.549 m)  Wt 153 lb 8 oz (69.627 kg)  BMI 29.02 kg/m2  SpO2 96% Physical Exam  Constitutional: She is oriented to person, place, and time. She appears well-developed and well-nourished. No distress.  HENT:  Head: Normocephalic and atraumatic.  Right Ear: External ear normal. Tympanic membrane is erythematous. A middle ear effusion is present.  Left Ear: Tympanic membrane and external ear normal.  Nose: Nose normal.  Mouth/Throat: Posterior oropharyngeal erythema (mild) present. No oropharyngeal exudate.  Eyes: Conjunctivae are normal. Pupils are equal, round, and reactive to light. Right eye exhibits no discharge.  Left eye exhibits no discharge. No scleral icterus.  Neck: Normal range of motion. Neck supple. No tracheal deviation present. No thyromegaly present.  Cardiovascular: Normal rate, regular rhythm, normal heart sounds and intact distal pulses.  Exam reveals no gallop and no friction rub.   No murmur heard. Pulmonary/Chest: Effort normal and breath sounds normal. No accessory muscle usage. Not tachypneic. No respiratory distress. She has no decreased breath sounds. She has no wheezes. She has no rhonchi. She has no rales. She exhibits no tenderness.  Musculoskeletal: Normal range of motion. She exhibits no edema and no tenderness.  Lymphadenopathy:    She has no cervical adenopathy.  Neurological: She is alert and oriented to person, place, and time. No cranial nerve deficit. She exhibits normal muscle tone. Coordination normal.  Skin: Skin is warm and dry. No rash noted. She is not diaphoretic. No erythema. No pallor.  Psychiatric: She has a normal mood and affect. Her behavior is normal. Judgment and thought content normal.    Rapid strep negative.      Assessment & Plan:   Problem List Items Addressed This Visit     Unprioritized   Acute nonsuppurative otitis media of right ear - Primary     Symptoms of viral URI with secondary ROM. No recurrent fever since starting Azithromycin. Gradually improving overall. Offerred reassurance today. Recommended continuing Azithromycin. RTC if any recurrent fever or worsening cough, dyspnea. Recheck ear in 3-4 weeks.        Return in about 4 weeks (around 08/21/2013) for Recheck.

## 2013-07-24 NOTE — Progress Notes (Signed)
Pre visit review using our clinic review tool, if applicable. No additional management support is needed unless otherwise documented below in the visit note. 

## 2013-07-24 NOTE — Assessment & Plan Note (Signed)
Symptoms of viral URI with secondary ROM. No recurrent fever since starting Azithromycin. Gradually improving overall. Offerred reassurance today. Recommended continuing Azithromycin. RTC if any recurrent fever or worsening cough, dyspnea. Recheck ear in 3-4 weeks.

## 2013-07-24 NOTE — Telephone Encounter (Signed)
Per letter from [employer], [facility] ([place]) sent pt a check for the balance, with expectations of her to pay the [employer] bill with.  Pt states that she did not receive a check.  Advised pt that she should contact [employer] about this.

## 2013-07-28 ENCOUNTER — Ambulatory Visit (INDEPENDENT_AMBULATORY_CARE_PROVIDER_SITE_OTHER): Payer: Self-pay | Admitting: General Surgery

## 2013-07-28 ENCOUNTER — Encounter: Payer: Self-pay | Admitting: General Surgery

## 2013-07-28 VITALS — BP 132/82 | HR 82 | Resp 14 | Ht 61.0 in | Wt 156.0 lb

## 2013-07-28 DIAGNOSIS — Z1211 Encounter for screening for malignant neoplasm of colon: Secondary | ICD-10-CM | POA: Insufficient documentation

## 2013-07-28 DIAGNOSIS — C50219 Malignant neoplasm of upper-inner quadrant of unspecified female breast: Secondary | ICD-10-CM

## 2013-07-28 DIAGNOSIS — C50211 Malignant neoplasm of upper-inner quadrant of right female breast: Secondary | ICD-10-CM

## 2013-07-28 MED ORDER — POLYETHYLENE GLYCOL 3350 17 GM/SCOOP PO POWD: ORAL | Status: DC

## 2013-07-28 NOTE — Patient Instructions (Addendum)
Patient to return in 3 months with right breast mammogram. Continue self breast exams. Call office for any new breast issues or concerns. Patient to be scheduled for colonoscopy.   Colonoscopy A colonoscopy is an exam to look at the entire large intestine (colon). This exam can help find problems such as tumors, polyps, inflammation, and areas of bleeding. The exam takes about 1 hour.  LET [facility] CARE PROVIDER KNOW ABOUT:   Any allergies you have.  All medicines you are taking, including vitamins, herbs, eye drops, creams, and over-the-counter medicines.  Previous problems you or members of your family have had with the use of anesthetics.  Any blood disorders you have.  Previous surgeries you have had.  Medical conditions you have. RISKS AND COMPLICATIONS  Generally, this is a safe procedure. However, as with any procedure, complications can occur. Possible complications include:  Bleeding.  Tearing or rupture of the colon wall.  Reaction to medicines given during the exam.  Infection (rare). BEFORE THE PROCEDURE   Ask your health care provider about changing or stopping your regular medicines.  You may be prescribed an oral bowel prep. This involves drinking a large amount of medicated liquid, starting the day before your procedure. The liquid will cause you to have multiple loose stools until your stool is almost clear or light green. This cleans out your colon in preparation for the procedure.  Do not eat or drink anything else once you have started the bowel prep, unless your health care provider tells you it is safe to do so.  Arrange for someone to drive you home after the procedure. PROCEDURE   You will be given medicine to help you relax (sedative).  You will lie on your side with your knees bent.  A long, flexible tube with a light and camera on the end (colonoscope) will be inserted through the rectum and into the colon. The camera sends video back to a  computer screen as it moves through the colon. The colonoscope also releases carbon dioxide gas to inflate the colon. This helps your health care provider see the area better.  During the exam, your health care provider may take a small tissue sample (biopsy) to be examined under a microscope if any abnormalities are found.  The exam is finished when the entire colon has been viewed. AFTER THE PROCEDURE   Do not drive for 24 hours after the exam.  You may have a small amount of blood in your stool.  You may pass moderate amounts of gas and have mild abdominal cramping or bloating. This is caused by the gas used to inflate your colon during the exam.  Ask when your test results will be ready and how you will get your results. Make sure you get your test results. Document Released: 01/07/2000 Document Revised: 10/30/2012 Document Reviewed: 09/16/2012 [facility] Patient Information 2015 [place], [place]. This information is not intended to replace advice given to you by your health care provider. Make sure you discuss any questions you have with your health care provider.  Patient has been scheduled for a colonoscopy on 09-30-13 at [facility]. It is okay for patient to continue 81 mg aspirin.

## 2013-07-28 NOTE — Progress Notes (Signed)
Patient ID: Christie Ramirez, female   DOB: 12-26-1955, 58 y.o.   MRN: [id number]  Chief Complaint  Patient presents with  . Follow-up    mammosite placement    HPI Christie Ramirez is a 58 y.o. female today for follow up right breast mammosite done 05-19-13. Patient states she is doing well. She reports she just recently got over a bout of strep throat and finished her antibiotics on 07/26/13. She reports some mild tenderness of the right breast but otherwise is done very well. She is pleased with the cosmetic results.  She had been started on Loving under the care of Forest Gleason, M.D. After one month she reported ankle swelling and the medication was discontinued. She will discuss her resumption or changed to a new agent at her followup with him later this month.  Of note, the patient denied any musculoskeletal symptoms.  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 She has not had a screening study since that time. She does not report any change in her bowel habits. Marland Kitchen                          HPI  Past Medical History  Diagnosis Date  . Hyperlipidemia   . Hypothyroidism   . Anxiety   . Endometriosis 2009  . Cancer 1995    thyroid  . 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     Past Surgical History  Procedure Laterality Date  . Colectomy  2009    secondary to endometriosis DR. Smith  . Colonoscopy  2009    Dr. Tamala Julian  . Foot surgery  right  . Abdominal hysterectomy  2005    Martin DeFrancesco,MD  . Colon surgery  2009    Secondary to endometriosis.  Rochel Brome, MD  . Breast biopsy Right 04-02-13    INVASIVE MAMMARY CARCINOMA WITH FEATURES OF TUBULAR CARCINOMA,  . Breast surgery Right 05/01/13    wide excision    No family history on file.  Social History History  Substance Use Topics  . Smoking status: Never Smoker   .  Smokeless tobacco: Never Used  . Alcohol Use: Yes     Comment: wine    Allergies  Allergen Reactions  . Codeine Anaphylaxis  . Ace Inhibitors   . Iodinated Diagnostic Agents Hives  . Penicillins Rash    Current Outpatient Prescriptions  Medication Sig Dispense Refill  . Amantadine HCl 100 MG tablet Take 1 tablet by mouth daily.      Marland Kitchen amLODipine (NORVASC) 5 MG tablet TAKE 1 TABLET BY MOUTH EVERY DAY  30 tablet  5  . aspirin 81 MG tablet Take 81 mg by mouth daily.      Marland Kitchen atorvastatin (LIPITOR) 40 MG tablet TAKE 1 TABLET BY MOUTH EVERY DAY  90 tablet  1  . clonazePAM (KLONOPIN) 1 MG tablet TAKE 1 TABLET BY MOUTH 3 TIMES A DAY AS NEEDED  90 tablet  3  . escitalopram (LEXAPRO) 20 MG tablet TAKE 1 TABLET (20 MG TOTAL) BY MOUTH DAILY.  90 tablet  0  . levothyroxine (SYNTHROID, LEVOTHROID) 150 MCG tablet Take 1 tablet (150 mcg total) by mouth daily.  90 tablet  4  . losartan (COZAAR) 100 MG tablet TAKE 1 TABLET BY MOUTH EVERY DAY  30 tablet  5  .  Multiple Vitamins-Minerals (MULTIVITAMIN WITH MINERALS) tablet Take 1 tablet by mouth daily.      Marland Kitchen zolpidem (AMBIEN) 10 MG tablet TAKE 1 TABLET BY MOUTH AT BEDTIME AS NEEDED  30 tablet  4  . polyethylene glycol powder (GLYCOLAX/MIRALAX) powder 255 grams one bottle for colonoscopy prep  255 g  0   No current facility-administered medications for this visit.    Review of Systems Review of Systems  Constitutional: Negative.   Respiratory: Negative.   Cardiovascular: Negative.     Blood pressure 132/82, pulse 82, resp. rate 14, height 5\' 1"  (1.549 m), weight 156 lb (70.761 kg).  Physical Exam Physical Exam  Constitutional: She is oriented to person, place, and time. She appears well-developed and well-nourished.  Pulmonary/Chest:    Well healed right breast scar.  Neurological: She is alert and oriented to person, place, and time.  Skin: Skin is warm and dry.       Assessment    Doing well status post breast conservation and  partial breast radiation.     Plan    Arrangements were made for followup mammograms in October 2015 with an office visit to follow.      Patient has been scheduled for a colonoscopy on 09-30-13 at [facility] (per patient's request to wait till September 2015). It is okay for patient to continue 81 mg aspirin.   PCP: Sonnie Alamo 07/28/2013, 10:02 PM

## 2013-08-26 ENCOUNTER — Ambulatory Visit: Payer: Self-pay | Admitting: Oncology

## 2013-08-28 ENCOUNTER — Ambulatory Visit: Payer: Self-pay | Admitting: Oncology

## 2013-08-29 ENCOUNTER — Ambulatory Visit: Payer: BC Managed Care – PPO | Admitting: Internal Medicine

## 2013-09-02 ENCOUNTER — Ambulatory Visit: Payer: BC Managed Care – PPO | Admitting: Internal Medicine

## 2013-09-19 ENCOUNTER — Ambulatory Visit (INDEPENDENT_AMBULATORY_CARE_PROVIDER_SITE_OTHER): Payer: BC Managed Care – PPO | Admitting: Internal Medicine

## 2013-09-19 ENCOUNTER — Encounter: Payer: Self-pay | Admitting: Internal Medicine

## 2013-09-19 VITALS — BP 110/76 | HR 91 | Resp 14 | Ht 61.0 in | Wt 156.8 lb

## 2013-09-19 DIAGNOSIS — G4733 Obstructive sleep apnea (adult) (pediatric): Secondary | ICD-10-CM

## 2013-09-19 DIAGNOSIS — E039 Hypothyroidism, unspecified: Secondary | ICD-10-CM

## 2013-09-19 DIAGNOSIS — I10 Essential (primary) hypertension: Secondary | ICD-10-CM

## 2013-09-19 LAB — T4, FREE: FREE T4: 1.22 ng/dL (ref 0.60–1.60)

## 2013-09-19 LAB — TSH: TSH: 0.08 u[IU]/mL — AB (ref 0.35–4.50)

## 2013-09-19 MED ORDER — ZOLPIDEM TARTRATE 10 MG PO TABS: ORAL_TABLET | ORAL | Status: DC

## 2013-09-19 NOTE — Patient Instructions (Signed)
We will set up a sleep study.  Follow up in 6 months for physical.

## 2013-09-19 NOTE — Assessment & Plan Note (Signed)
Symptoms most consistent with OSA. Will set up sleep study for evaluation.

## 2013-09-19 NOTE — Progress Notes (Signed)
Subjective:    Patient ID: Christie Ramirez, female    DOB: 1955-11-16, 58 y.o.   MRN: [id number]  HPI 58YO female presents for follow up.  Was told by friend that she stops breathing during sleep. Feels tired during the day.  Aside from this feeling well. No further ear pain.  Review of Systems  Constitutional: Positive for fatigue. Negative for fever, chills, appetite change and unexpected weight change.  HENT: Negative for congestion, ear pain, rhinorrhea, sinus pressure, sore throat and trouble swallowing.   Eyes: Negative for visual disturbance.  Respiratory: Negative for shortness of breath.   Cardiovascular: Negative for chest pain and leg swelling.  Gastrointestinal: Negative for nausea, vomiting, abdominal pain, diarrhea and constipation.  Skin: Negative for color change and rash.  Hematological: Negative for adenopathy. Does not bruise/bleed easily.  Psychiatric/Behavioral: Negative for dysphoric mood. The patient is not nervous/anxious.        Objective:    BP 110/76  Pulse 91  Resp 14  Ht 5\' 1"  (1.549 m)  Wt 156 lb 12 oz (71.101 kg)  BMI 29.63 kg/m2  SpO2 97% Physical Exam  Constitutional: She is oriented to person, place, and time. She appears well-developed and well-nourished. No distress.  HENT:  Head: Normocephalic and atraumatic.  Right Ear: External ear normal.  Left Ear: External ear normal.  Nose: Nose normal.  Mouth/Throat: Oropharynx is clear and moist. No oropharyngeal exudate.  Eyes: Conjunctivae are normal. Pupils are equal, round, and reactive to light. Right eye exhibits no discharge. Left eye exhibits no discharge. No scleral icterus.  Neck: Normal range of motion. Neck supple. No tracheal deviation present. No thyromegaly present.  Cardiovascular: Normal rate, regular rhythm, normal heart sounds and intact distal pulses.  Exam reveals no gallop and no friction rub.   No murmur heard. Pulmonary/Chest: Effort normal and breath sounds normal.  No accessory muscle usage. Not tachypneic. No respiratory distress. She has no decreased breath sounds. She has no wheezes. She has no rhonchi. She has no rales. She exhibits no tenderness.  Musculoskeletal: Normal range of motion. She exhibits no edema and no tenderness.  Lymphadenopathy:    She has no cervical adenopathy.  Neurological: She is alert and oriented to person, place, and time. No cranial nerve deficit. She exhibits normal muscle tone. Coordination normal.  Skin: Skin is warm and dry. No rash noted. She is not diaphoretic. No erythema. No pallor.  Psychiatric: She has a normal mood and affect. Her behavior is normal. Judgment and thought content normal.          Assessment & Plan:   Problem List Items Addressed This Visit     Unprioritized   Hypertension      BP Readings from Last 3 Encounters:  09/19/13 110/76  07/28/13 132/82  07/24/13 112/72   BP well controlled on current medication. Will continue.    Hypothyroidism     Recent TSH was low. Will recheck TSH and free T4 with labs.    Obstructive sleep apnea - Primary     Symptoms most consistent with OSA. Will set up sleep study for evaluation.    Relevant Orders      Ambulatory referral to Sleep Studies    Other Visit Diagnoses   Unspecified hypothyroidism        Relevant Orders       TSH       T4, free        Return in about 6 months (around 03/22/2014) for Physical.

## 2013-09-19 NOTE — Assessment & Plan Note (Signed)
Recent TSH was low. Will recheck TSH and free T4 with labs.

## 2013-09-19 NOTE — Progress Notes (Signed)
Pre visit review using our clinic review tool, if applicable. No additional management support is needed unless otherwise documented below in the visit note. 

## 2013-09-19 NOTE — Assessment & Plan Note (Signed)
BP Readings from Last 3 Encounters:  09/19/13 110/76  07/28/13 132/82  07/24/13 112/72   BP well controlled on current medication. Will continue.

## 2013-09-22 ENCOUNTER — Telehealth: Payer: Self-pay | Admitting: *Deleted

## 2013-09-22 NOTE — Telephone Encounter (Signed)
Message for patient to call the office.   Patient is scheduled for a colonoscopy at [facility] for 09-30-13. We need to verify that patient has not had a change in medication since last office visit, confirm that she has pre-registered, and that she has Miralax prescription.

## 2013-09-23 ENCOUNTER — Ambulatory Visit: Payer: Self-pay | Admitting: Oncology

## 2013-09-23 ENCOUNTER — Other Ambulatory Visit: Payer: Self-pay | Admitting: *Deleted

## 2013-09-23 MED ORDER — LEVOTHYROXINE SODIUM 125 MCG PO TABS: 125.0000 ug | ORAL_TABLET | Freq: Every day | ORAL | Status: DC

## 2013-09-23 NOTE — Telephone Encounter (Signed)
Patient called back and said that she does not want to have her colonoscopy done at this time. She said that she would like to have this done some time in December. I let her know that we would call her when the December surgery schedule was available. Colonoscopy has been canceled with Trish in endoscopy.

## 2013-09-23 NOTE — Progress Notes (Signed)
Notified pt. 

## 2013-09-23 NOTE — Progress Notes (Signed)
Sent Rx to pharmacy  

## 2013-09-24 ENCOUNTER — Telehealth: Payer: Self-pay | Admitting: *Deleted

## 2013-09-24 NOTE — Telephone Encounter (Signed)
Patient was contacted to reschedule colonoscopy. This is now scheduled at [facility] for 01-07-14.  Trish in Endoscopy notified accordingly.

## 2013-10-09 ENCOUNTER — Encounter: Payer: BC Managed Care – PPO | Admitting: Podiatrist

## 2013-10-16 ENCOUNTER — Other Ambulatory Visit: Payer: Self-pay | Admitting: Internal Medicine

## 2013-10-22 ENCOUNTER — Telehealth: Payer: Self-pay | Admitting: *Deleted

## 2013-10-22 NOTE — Telephone Encounter (Signed)
Pt is coming in tomorrow what labs and dx?  

## 2013-10-22 NOTE — Telephone Encounter (Signed)
TSH and Free T4 244.9

## 2013-10-23 ENCOUNTER — Other Ambulatory Visit (INDEPENDENT_AMBULATORY_CARE_PROVIDER_SITE_OTHER): Payer: BC Managed Care – PPO

## 2013-10-23 ENCOUNTER — Other Ambulatory Visit: Payer: Self-pay | Admitting: *Deleted

## 2013-10-23 DIAGNOSIS — E038 Other specified hypothyroidism: Secondary | ICD-10-CM

## 2013-10-23 LAB — T4, FREE: Free T4: 0.92 ng/dL (ref 0.60–1.60)

## 2013-10-23 LAB — TSH: TSH: 1.33 u[IU]/mL (ref 0.35–4.50)

## 2013-10-24 ENCOUNTER — Encounter: Payer: Self-pay | Admitting: *Deleted

## 2013-10-30 ENCOUNTER — Ambulatory Visit: Payer: BC Managed Care – PPO | Admitting: General Surgery

## 2013-11-10 ENCOUNTER — Encounter: Payer: Self-pay | Admitting: General Surgery

## 2013-11-11 ENCOUNTER — Ambulatory Visit (INDEPENDENT_AMBULATORY_CARE_PROVIDER_SITE_OTHER): Payer: BC Managed Care – PPO | Admitting: General Surgery

## 2013-11-11 ENCOUNTER — Encounter: Payer: Self-pay | Admitting: General Surgery

## 2013-11-11 VITALS — BP 142/98 | HR 100 | Resp 14 | Ht 61.0 in | Wt 154.0 lb

## 2013-11-11 DIAGNOSIS — C50211 Malignant neoplasm of upper-inner quadrant of right female breast: Secondary | ICD-10-CM

## 2013-11-11 NOTE — Progress Notes (Signed)
Patient ID: Christie Ramirez, female   DOB: February 07, 1955, 58 y.o.   MRN: [id number]  Chief Complaint  Patient presents with  . Follow-up    right breast mammogram    HPI Christie Ramirez is a 58 y.o. female here today for a follow up right breast mammogram done on 11/07/13. Patient state she is doing well with no new problems.   She was somewhat distressed at the recent bone density suggestive some osteopenia. She reports her last couple of weeks she's had pain and one hip or the other. I reassured her this was not secondary to the osteopenia. HPI  Past Medical History  Diagnosis Date  . Hyperlipidemia   . Hypothyroidism   . Anxiety   . Endometriosis 2009  . Cancer 1995    thyroid  . 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     Past Surgical History  Procedure Laterality Date  . Colectomy  2009    secondary to endometriosis DR. Smith  . Colonoscopy  2009    Dr. Tamala Julian  . Foot surgery  right  . Abdominal hysterectomy  2005    Martin DeFrancesco,MD  . Breast biopsy Right 04-02-13    INVASIVE MAMMARY CARCINOMA WITH FEATURES OF TUBULAR CARCINOMA,  . Breast surgery Right 05/01/13    wide excision  . 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 No malignancy.    Family History  Problem Relation Age of Onset  . Colon polyps Father     age 40    Social History History  Substance Use Topics  . Smoking status: Never Smoker   . Smokeless tobacco: Never Used  . Alcohol Use: Yes     Comment: wine    Allergies  Allergen Reactions  . Codeine Anaphylaxis  . Ace Inhibitors   . Iodinated Diagnostic Agents Hives  . Penicillins Rash    Current Outpatient Prescriptions  Medication Sig Dispense Refill  . Amantadine HCl 100 MG tablet Take 1 tablet by mouth daily.      Marland Kitchen amLODipine (NORVASC) 5 MG tablet TAKE 1 TABLET BY MOUTH  EVERY DAY  30 tablet  5  . aspirin 81 MG tablet Take 81 mg by mouth daily.      Marland Kitchen atorvastatin (LIPITOR) 40 MG tablet TAKE 1 TABLET BY MOUTH EVERY DAY  90 tablet  1  . clonazePAM (KLONOPIN) 1 MG tablet TAKE 1 TABLET BY MOUTH 3 TIMES A DAY AS NEEDED  90 tablet  3  . letrozole (FEMARA) 2.5 MG tablet       . levothyroxine (SYNTHROID, LEVOTHROID) 125 MCG tablet Take 1 tablet (125 mcg total) by mouth daily.  30 tablet  2  . levothyroxine (SYNTHROID, LEVOTHROID) 150 MCG tablet TAKE 1 TABLET (150 MCG TOTAL) BY MOUTH DAILY.  90 tablet  3  . losartan (COZAAR) 100 MG tablet TAKE 1 TABLET BY MOUTH EVERY DAY  30 tablet  5  . Multiple Vitamins-Minerals (MULTIVITAMIN WITH MINERALS) tablet Take 1 tablet by mouth daily.      . polyethylene glycol powder (GLYCOLAX/MIRALAX) powder 255 grams one bottle for colonoscopy prep  255 g  0  . zolpidem (AMBIEN) 10 MG tablet TAKE 1 TABLET BY MOUTH AT BEDTIME AS NEEDED  30 tablet  4   No current facility-administered medications for this visit.    Review of Systems Review of Systems  Constitutional: Negative.   Respiratory: Negative.   Cardiovascular: Negative.     Blood pressure 142/98, pulse 100, resp. rate 14, height 5\' 1"  (1.549 m), weight 154 lb (69.854 kg).  Physical Exam Physical Exam  Constitutional: She is oriented to person, place, and time. She appears well-developed and well-nourished.  Eyes: Conjunctivae are normal. No scleral icterus.  Neck: Neck supple.  Cardiovascular: Normal rate, regular rhythm and normal heart sounds.   Pulmonary/Chest: Effort normal and breath sounds normal. Right breast exhibits no inverted nipple, no mass, no nipple discharge, no skin change and no tenderness. Left breast exhibits no inverted nipple, no mass, no nipple discharge, no skin change and no tenderness.  Well healed incision right breast.   Lymphadenopathy:    She has no cervical adenopathy.    She has no axillary adenopathy.  Neurological: She is alert and  oriented to person, place, and time.  Skin: Skin is warm and dry.    Data Reviewed Pathology from the December 2008 right colectomy showed evidence of a submucosal endometrioma. No malignancy.  Assessment    Doing well status post conservative management of her right breast malignancy.  No evidence of adenomatous polyps of the colon.   Bone density exam of May 29, 2011 completely [facility] was normal.  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    Plan    The patient should consider a screening colonoscopy in 2018, earlier if any GI symptoms develop.  We'll plan for a clinical followup and bilateral mammograms in 6 months.  Her bone density change between 2013 2015 with the development of osteopenia in the femoral neck. She will review these results with medical oncology at her next appointment. She is making use of weightbearing exercise and walking.  Patient to return in six month bilateral diagnotic mammogram.     PCP: Sonnie Alamo 11/11/2013, 8:35 PM

## 2013-11-11 NOTE — Patient Instructions (Signed)
Patient to return in six month bilateral diagnotic mammogram 

## 2013-11-12 ENCOUNTER — Telehealth: Payer: Self-pay | Admitting: *Deleted

## 2013-11-12 NOTE — Telephone Encounter (Signed)
Message copied by Dominga Ferry on Wed Nov 12, 2013  8:55 AM ------      Message from: [place], Forest Gleason      Created: Tue Nov 11, 2013  8:40 PM       Please notify the patient is on review of the 2008 pathology she does not require repeat colonoscopy Until 2018, assuming no GI problems. She may have been tentatively scheduled for an Colonoscopy exam in November/December of this year. This can be canceled.  ------

## 2013-11-12 NOTE — Telephone Encounter (Signed)
Message left for patient to call the office.

## 2013-11-13 ENCOUNTER — Encounter: Payer: Self-pay | Admitting: *Deleted

## 2013-11-13 ENCOUNTER — Telehealth: Payer: Self-pay | Admitting: *Deleted

## 2013-11-13 NOTE — Telephone Encounter (Signed)
Message left on cell and work numbers for patient to call the office.

## 2013-11-20 ENCOUNTER — Encounter: Payer: Self-pay | Admitting: Internal Medicine

## 2013-11-20 ENCOUNTER — Telehealth: Payer: Self-pay

## 2013-11-20 NOTE — Telephone Encounter (Signed)
The patient called and needs a note stating she can return to work on Nov 1st at her part time job, if this is okay with Dr.Walker.  She is hoping to work 10-15 hours per week.   Pt Callback - [phone]

## 2013-12-03 ENCOUNTER — Other Ambulatory Visit: Payer: Self-pay | Admitting: Internal Medicine

## 2013-12-03 NOTE — Telephone Encounter (Signed)
Last Rx dated 09/19/13 with 4 refills, pharmacy states they don't have that Rx, refill?

## 2013-12-03 NOTE — Telephone Encounter (Signed)
Faxed to pharmacy

## 2013-12-06 ENCOUNTER — Other Ambulatory Visit: Payer: Self-pay | Admitting: Internal Medicine

## 2013-12-11 ENCOUNTER — Other Ambulatory Visit: Payer: Self-pay | Admitting: Internal Medicine

## 2013-12-12 ENCOUNTER — Ambulatory Visit (INDEPENDENT_AMBULATORY_CARE_PROVIDER_SITE_OTHER): Payer: BC Managed Care – PPO | Admitting: Nurse Practitioner

## 2013-12-12 ENCOUNTER — Encounter: Payer: Self-pay | Admitting: Nurse Practitioner

## 2013-12-12 VITALS — BP 110/72 | HR 95 | Temp 97.6°F | Resp 14 | Ht 61.0 in | Wt 155.5 lb

## 2013-12-12 DIAGNOSIS — T23202A Burn of second degree of left hand, unspecified site, initial encounter: Secondary | ICD-10-CM

## 2013-12-12 NOTE — Progress Notes (Signed)
Pre visit review using our clinic review tool, if applicable. No additional management support is needed unless otherwise documented below in the visit note. 
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
## 2013-12-12 NOTE — Patient Instructions (Addendum)
Use Silvadene Cream 1% topically to burn a few times each day. Cover with telfa dressing when working, otherwise keep open to air.  Follow up with [place] after Thanksgiving to check on progress.  Call us sooner if fever, change in symptoms, worsening of burn or pain.

## 2013-12-22 ENCOUNTER — Other Ambulatory Visit: Payer: Self-pay | Admitting: Internal Medicine

## 2013-12-25 ENCOUNTER — Other Ambulatory Visit: Payer: Self-pay | Admitting: Internal Medicine

## 2013-12-29 ENCOUNTER — Ambulatory Visit (INDEPENDENT_AMBULATORY_CARE_PROVIDER_SITE_OTHER): Payer: BC Managed Care – PPO | Admitting: Internal Medicine

## 2013-12-29 ENCOUNTER — Encounter: Payer: Self-pay | Admitting: Internal Medicine

## 2013-12-29 VITALS — BP 122/82 | HR 101 | Temp 97.9°F | Ht 61.0 in | Wt 156.0 lb

## 2013-12-29 DIAGNOSIS — T23202D Burn of second degree of left hand, unspecified site, subsequent encounter: Secondary | ICD-10-CM

## 2013-12-29 DIAGNOSIS — Z0000 Encounter for general adult medical examination without abnormal findings: Secondary | ICD-10-CM

## 2013-12-29 MED ORDER — CLONAZEPAM 1 MG PO TABS: ORAL_TABLET | ORAL | Status: DC

## 2013-12-29 MED ORDER — ZOLPIDEM TARTRATE 10 MG PO TABS: 10.0000 mg | ORAL_TABLET | Freq: Every evening | ORAL | Status: DC | PRN

## 2013-12-29 NOTE — Assessment & Plan Note (Signed)
Wound healing well. Continue Sulfadiazine. Follow up in 02/2014 and prn.

## 2013-12-29 NOTE — Patient Instructions (Addendum)
Continue Sulfadiazine to left hand wound.  Labs in January.  Follow up in 6 months and sooner as needed.
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
## 2013-12-29 NOTE — Progress Notes (Signed)
Pre visit review using our clinic review tool, if applicable. No additional management support is needed unless otherwise documented below in the visit note. 

## 2014-01-06 ENCOUNTER — Other Ambulatory Visit: Payer: Self-pay | Admitting: Internal Medicine

## 2014-01-13 ENCOUNTER — Ambulatory Visit (INDEPENDENT_AMBULATORY_CARE_PROVIDER_SITE_OTHER): Payer: BC Managed Care – PPO | Admitting: Internal Medicine

## 2014-01-13 ENCOUNTER — Ambulatory Visit: Payer: BC Managed Care – PPO | Admitting: Internal Medicine

## 2014-01-13 ENCOUNTER — Encounter: Payer: Self-pay | Admitting: Internal Medicine

## 2014-01-13 VITALS — BP 118/84 | HR 92 | Temp 97.8°F | Wt 153.5 lb

## 2014-01-13 DIAGNOSIS — J069 Acute upper respiratory infection, unspecified: Secondary | ICD-10-CM

## 2014-01-13 MED ORDER — AZITHROMYCIN 250 MG PO TABS: ORAL_TABLET | ORAL | Status: DC

## 2014-01-13 NOTE — Progress Notes (Signed)
HPI  Pt presents to the clinic today with c/o cough, chest congestion, runny nose and nasal congestion. She reports this started 4 days ago. The cough is productive of yellow mucous. She is blowing yellow  mucous from her nose. She denies fevers but has had sweats and body aches. She has not tried anything OTC. She has a history of allergies but deneis breathing problems. She has had sick contacts.  Review of Systems      Past Medical History  Diagnosis Date  . Hyperlipidemia   . Hypothyroidism   . Anxiety   . Endometriosis 2009  . Cancer 1995    thyroid  . 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     Family History  Problem Relation Age of Onset  . Colon polyps Father     age 50    History   Social History  . Marital Status: Single    Spouse Name: N/A    Number of Children: N/A  . Years of Education: N/A   Occupational History  . Not on file.   Social History Main Topics  . Smoking status: Never Smoker   . Smokeless tobacco: Never Used  . Alcohol Use: 0.0 oz/week    0 Not specified per week     Comment: wine  . Drug Use: No  . Sexual Activity: Not on file   Other Topics Concern  . Not on file   Social History Narrative    Allergies  Allergen Reactions  . Codeine Anaphylaxis  . Ace Inhibitors   . Iodinated Diagnostic Agents Hives  . Penicillins Rash     Constitutional: Positive headache, fatigue. Denies fever or abrupt weight changes.  HEENT:  Positiverunny nose, nasal congestion,  sore throat. Denies eye redness, eye pain, pressure behind the eyes, facial pain, ear pain, ringing in the ears, wax buildup, or bloody nose. Respiratory: Positive cough. Denies difficulty breathing or shortness of breath.  Cardiovascular: Denies chest pain, chest tightness, palpitations or swelling in the hands or feet.   No other specific complaints in a complete  review of systems (except as listed in HPI above).  Objective:  BP 118/84 mmHg  Pulse 92  Temp(Src) 97.8 F (36.6 C) (Oral)  Wt 153 lb 8 oz (69.627 kg)  SpO2 98%  Wt Readings from Last 3 Encounters:  01/13/14 153 lb 8 oz (69.627 kg)  12/29/13 156 lb (70.761 kg)  12/12/13 155 lb 8 oz (70.534 kg)     General: Appears her stated age, ill appearing in NAD. HEENT: Head: normal shape and size, no sinus tenderness noted; Eyes: sclera white, no icterus, conjunctiva pink; Ears: Tm's red but intact, normal light reflex, + serous effusion bilaterally; Nose: mucosa pink and moist, septum midline; Throat/Mouth: Teeth present, mucosa erythematous and moist, no exudate noted, no lesions or ulcerations noted.  Neck: No cervical lymphadenopathy. .  Cardiovascular: Normal rate and rhythm. S1,S2 noted.  No murmur, rubs or gallops noted.  Pulmonary/Chest: Normal effort and coarse vesicular breath sounds. No respiratory distress. No wheezes, rales or ronchi noted.      Assessment & Plan:   Upper Respiratory Infection:  Get some rest and drink plenty of water Do salt water gargles/Ibuprofen for the sore throat Try Mucinex OTC eRx for Azithromax x 5 days Delsym OTC for cough  RTC as needed or if symptoms persist.

## 2014-01-13 NOTE — Progress Notes (Signed)
Pre visit review using our clinic review tool, if applicable. No additional management support is needed unless otherwise documented below in the visit note. 

## 2014-01-13 NOTE — Patient Instructions (Signed)
Upper Respiratory Infection, Adult An upper respiratory infection (URI) is also sometimes known as the common cold. The upper respiratory tract includes the nose, sinuses, throat, trachea, and bronchi. Bronchi are the airways leading to the lungs. Most people improve within 1 week, but symptoms can last up to 2 weeks. A residual cough may last even longer.  CAUSES Many different viruses can infect the tissues lining the upper respiratory tract. The tissues become irritated and inflamed and often become very moist. Mucus production is also common. A cold is contagious. You can easily spread the virus to others by oral contact. This includes kissing, sharing a glass, coughing, or sneezing. Touching your mouth or nose and then touching a surface, which is then touched by another person, can also spread the virus. SYMPTOMS  Symptoms typically develop 1 to 3 days after you come in contact with a cold virus. Symptoms vary from person to person. They may include:  Runny nose.  Sneezing.  Nasal congestion.  Sinus irritation.  Sore throat.  Loss of voice (laryngitis).  Cough.  Fatigue.  Muscle aches.  Loss of appetite.  Headache.  Low-grade fever. DIAGNOSIS  You might diagnose your own cold based on familiar symptoms, since most people get a cold 2 to 3 times a year. Your caregiver can confirm this based on your exam. Most importantly, your caregiver can check that your symptoms are not due to another disease such as strep throat, sinusitis, pneumonia, asthma, or epiglottitis. Blood tests, throat tests, and X-rays are not necessary to diagnose a common cold, but they may sometimes be helpful in excluding other more serious diseases. Your caregiver will decide if any further tests are required. RISKS AND COMPLICATIONS  You may be at risk for a more severe case of the common cold if you smoke cigarettes, have chronic heart disease (such as heart failure) or lung disease (such as asthma), or if  you have a weakened immune system. The very young and very old are also at risk for more serious infections. Bacterial sinusitis, middle ear infections, and bacterial pneumonia can complicate the common cold. The common cold can worsen asthma and chronic obstructive pulmonary disease (COPD). Sometimes, these complications can require emergency medical care and may be life-threatening. PREVENTION  The best way to protect against getting a cold is to practice good hygiene. Avoid oral or hand contact with people with cold symptoms. Wash your hands often if contact occurs. There is no clear evidence that vitamin C, vitamin E, echinacea, or exercise reduces the chance of developing a cold. However, it is always recommended to get plenty of rest and practice good nutrition. TREATMENT  Treatment is directed at relieving symptoms. There is no cure. Antibiotics are not effective, because the infection is caused by a virus, not by bacteria. Treatment may include:  Increased fluid intake. Sports drinks offer valuable electrolytes, sugars, and fluids.  Breathing heated mist or steam (vaporizer or shower).  Eating chicken soup or other clear broths, and maintaining good nutrition.  Getting plenty of rest.  Using gargles or lozenges for comfort.  Controlling fevers with ibuprofen or acetaminophen as directed by your caregiver.  Increasing usage of your inhaler if you have asthma. Zinc gel and zinc lozenges, taken in the first 24 hours of the common cold, can shorten the duration and lessen the severity of symptoms. Pain medicines may help with fever, muscle aches, and throat pain. A variety of non-prescription medicines are available to treat congestion and runny nose. Your caregiver   can make recommendations and may suggest nasal or lung inhalers for other symptoms.  HOME CARE INSTRUCTIONS   Only take over-the-counter or prescription medicines for pain, discomfort, or fever as directed by your  caregiver.  Use a warm mist humidifier or inhale steam from a shower to increase air moisture. This may keep secretions moist and make it easier to breathe.  Drink enough water and fluids to keep your urine clear or pale yellow.  Rest as needed.  Return to work when your temperature has returned to normal or as your caregiver advises. You may need to stay home longer to avoid infecting others. You can also use a face mask and careful hand washing to prevent spread of the virus. SEEK MEDICAL CARE IF:   After the first few days, you feel you are getting worse rather than better.  You need your caregiver's advice about medicines to control symptoms.  You develop chills, worsening shortness of breath, or brown or red sputum. These may be signs of pneumonia.  You develop yellow or brown nasal discharge or pain in the face, especially when you bend forward. These may be signs of sinusitis.  You develop a fever, swollen neck glands, pain with swallowing, or white areas in the back of your throat. These may be signs of strep throat. SEEK IMMEDIATE MEDICAL CARE IF:   You have a fever.  You develop severe or persistent headache, ear pain, sinus pain, or chest pain.  You develop wheezing, a prolonged cough, cough up blood, or have a change in your usual mucus (if you have chronic lung disease).  You develop sore muscles or a stiff neck. Document Released: 07/05/2000 Document Revised: 04/03/2011 Document Reviewed: 04/16/2013 Exit[employer] Patient Information 2015 [employer]. This information is not intended to replace advice given to you by your health care provider. Make sure you discuss any questions you have with your health care provider.  

## 2014-01-17 ENCOUNTER — Emergency Department: Payer: Self-pay | Admitting: Emergency Medicine

## 2014-01-19 ENCOUNTER — Telehealth: Payer: Self-pay | Admitting: Internal Medicine

## 2014-01-19 NOTE — Telephone Encounter (Signed)
Pt in ER follow up visit for bronchitis. Pt d/c'd on 12/22. Please advise where to add to schedule.

## 2014-01-20 NOTE — Telephone Encounter (Signed)
Please call pt and put pt on the schedule

## 2014-01-20 NOTE — Telephone Encounter (Signed)
Monday at 1pm for 56min

## 2014-01-20 NOTE — Telephone Encounter (Signed)
Please advise appt time, ER visit (not hospital admission)

## 2014-01-26 ENCOUNTER — Ambulatory Visit: Payer: BC Managed Care – PPO | Admitting: Internal Medicine

## 2014-02-04 ENCOUNTER — Other Ambulatory Visit (INDEPENDENT_AMBULATORY_CARE_PROVIDER_SITE_OTHER): Payer: BC Managed Care – PPO

## 2014-02-04 ENCOUNTER — Telehealth: Payer: Self-pay | Admitting: *Deleted

## 2014-02-04 DIAGNOSIS — Z0000 Encounter for general adult medical examination without abnormal findings: Secondary | ICD-10-CM

## 2014-02-04 LAB — CBC WITH DIFFERENTIAL/PLATELET
BASOS ABS: 0 10*3/uL (ref 0.0–0.1)
Basophils Relative: 0.6 % (ref 0.0–3.0)
Eosinophils Absolute: 0.2 10*3/uL (ref 0.0–0.7)
Eosinophils Relative: 2.2 % (ref 0.0–5.0)
HCT: 39.1 % (ref 36.0–46.0)
HEMOGLOBIN: 12.8 g/dL (ref 12.0–15.0)
LYMPHS ABS: 2.5 10*3/uL (ref 0.7–4.0)
Lymphocytes Relative: 31.4 % (ref 12.0–46.0)
MCHC: 32.6 g/dL (ref 30.0–36.0)
MCV: 89.4 fl (ref 78.0–100.0)
MONOS PCT: 6.4 % (ref 3.0–12.0)
Monocytes Absolute: 0.5 10*3/uL (ref 0.1–1.0)
Neutro Abs: 4.8 10*3/uL (ref 1.4–7.7)
Neutrophils Relative %: 59.4 % (ref 43.0–77.0)
PLATELETS: 345 10*3/uL (ref 150.0–400.0)
RBC: 4.37 Mil/uL (ref 3.87–5.11)
RDW: 13.5 % (ref 11.5–15.5)
WBC: 8 10*3/uL (ref 4.0–10.5)

## 2014-02-04 LAB — LIPID PANEL
Cholesterol: 180 mg/dL (ref 0–200)
HDL: 54.4 mg/dL (ref >39.00)
LDL CALC: 93 mg/dL (ref 0–99)
NONHDL: 125.6
Total CHOL/HDL Ratio: 3
Triglycerides: 164 mg/dL — ABNORMAL HIGH (ref 0.0–149.0)
VLDL: 32.8 mg/dL (ref 0.0–40.0)

## 2014-02-04 LAB — COMPREHENSIVE METABOLIC PANEL
ALBUMIN: 4.5 g/dL (ref 3.5–5.2)
ALK PHOS: 106 U/L (ref 39–117)
ALT: 41 U/L — ABNORMAL HIGH (ref 0–35)
AST: 23 U/L (ref 0–37)
BILIRUBIN TOTAL: 0.9 mg/dL (ref 0.2–1.2)
BUN: 16 mg/dL (ref 6–23)
CO2: 26 mEq/L (ref 19–32)
Calcium: 9.9 mg/dL (ref 8.4–10.5)
Chloride: 104 mEq/L (ref 96–112)
Creatinine, Ser: 0.68 mg/dL (ref 0.40–1.20)
GFR: 94.65 mL/min (ref >60.00)
GLUCOSE: 88 mg/dL (ref 70–99)
Potassium: 4.6 mEq/L (ref 3.5–5.1)
SODIUM: 139 meq/L (ref 135–145)
TOTAL PROTEIN: 7.8 g/dL (ref 6.0–8.3)

## 2014-02-04 LAB — MICROALBUMIN / CREATININE URINE RATIO
CREATININE, U: 29 mg/dL
MICROALB/CREAT RATIO: 2.4 mg/g (ref 0.0–30.0)

## 2014-02-04 LAB — HEMOGLOBIN A1C: HEMOGLOBIN A1C: 6.2 % (ref 4.6–6.5)

## 2014-02-04 LAB — TSH: TSH: 1.29 u[IU]/mL (ref 0.35–4.50)

## 2014-02-04 LAB — VITAMIN D 25 HYDROXY (VIT D DEFICIENCY, FRACTURES): VITD: 16.52 ng/mL — ABNORMAL LOW (ref 30.00–100.00)

## 2014-02-04 NOTE — Telephone Encounter (Signed)
Pt would like to add blood type?

## 2014-02-04 NOTE — Telephone Encounter (Signed)
That is fine 

## 2014-02-05 ENCOUNTER — Encounter: Payer: Self-pay | Admitting: *Deleted

## 2014-02-05 LAB — ABO AND RH: Rh Type: POSITIVE

## 2014-02-18 ENCOUNTER — Ambulatory Visit: Payer: Self-pay | Admitting: Internal Medicine

## 2014-02-26 ENCOUNTER — Encounter: Payer: Self-pay | Admitting: Internal Medicine

## 2014-03-12 ENCOUNTER — Other Ambulatory Visit: Payer: Self-pay | Admitting: Internal Medicine

## 2014-03-23 ENCOUNTER — Encounter: Payer: BC Managed Care – PPO | Admitting: Internal Medicine

## 2014-04-21 ENCOUNTER — Encounter: Payer: BLUE CROSS/BLUE SHIELD | Admitting: Internal Medicine

## 2014-04-27 ENCOUNTER — Ambulatory Visit
Admit: 2014-04-27 | Disposition: A | Discharge status: Home / Self Care | Payer: Self-pay | Attending: Oncology | Admitting: Oncology

## 2014-04-27 LAB — COMPREHENSIVE METABOLIC PANEL
ALT: 36 U/L
AST: 25 U/L
Albumin: 3.9 g/dL
Alkaline Phosphatase: 77 U/L
Anion Gap: 10 (ref 7–16)
BUN: 23 mg/dL — ABNORMAL HIGH
Bilirubin,Total: 1.2 mg/dL
CALCIUM: 8.5 mg/dL — AB
CHLORIDE: 101 mmol/L
CO2: 25 mmol/L
Creatinine: 0.74 mg/dL
Glucose: 124 mg/dL — ABNORMAL HIGH
Potassium: 4 mmol/L
SODIUM: 136 mmol/L
Total Protein: 7 g/dL

## 2014-04-27 LAB — CBC CANCER CENTER
BASOS ABS: 0 x10 3/mm (ref 0.0–0.1)
BASOS PCT: 0.1 %
EOS ABS: 0 x10 3/mm (ref 0.0–0.7)
Eosinophil %: 0.3 %
HCT: 38.9 % (ref 35.0–47.0)
HGB: 12.9 g/dL (ref 12.0–16.0)
LYMPHS PCT: 5.7 %
Lymphocyte #: 0.4 x10 3/mm — ABNORMAL LOW (ref 1.0–3.6)
MCH: 29.5 pg (ref 26.0–34.0)
MCHC: 33.1 g/dL (ref 32.0–36.0)
MCV: 89 fL (ref 80–100)
MONO ABS: 0.3 x10 3/mm (ref 0.2–0.9)
Monocyte %: 5.2 %
NEUTROS PCT: 88.7 %
Neutrophil #: 5.5 x10 3/mm (ref 1.4–6.5)
PLATELETS: 273 x10 3/mm (ref 150–440)
RBC: 4.37 10*6/uL (ref 3.80–5.20)
RDW: 14 % (ref 11.5–14.5)
WBC: 6.2 x10 3/mm (ref 3.6–11.0)

## 2014-04-27 LAB — RAPID INFLUENZA A&B ANTIGENS

## 2014-05-07 ENCOUNTER — Ambulatory Visit (INDEPENDENT_AMBULATORY_CARE_PROVIDER_SITE_OTHER): Payer: BLUE CROSS/BLUE SHIELD | Admitting: Internal Medicine

## 2014-05-07 ENCOUNTER — Encounter: Payer: Self-pay | Admitting: Internal Medicine

## 2014-05-07 VITALS — BP 107/74 | HR 89 | Temp 97.7°F | Ht 61.0 in | Wt 151.2 lb

## 2014-05-07 DIAGNOSIS — I10 Essential (primary) hypertension: Secondary | ICD-10-CM

## 2014-05-07 DIAGNOSIS — C50211 Malignant neoplasm of upper-inner quadrant of right female breast: Secondary | ICD-10-CM | POA: Diagnosis not present

## 2014-05-07 DIAGNOSIS — Z0000 Encounter for general adult medical examination without abnormal findings: Secondary | ICD-10-CM

## 2014-05-07 LAB — TSH: TSH: 7.45 u[IU]/mL — ABNORMAL HIGH (ref 0.35–4.50)

## 2014-05-07 LAB — COMPREHENSIVE METABOLIC PANEL
ALT: 53 U/L — ABNORMAL HIGH (ref 0–35)
AST: 24 U/L (ref 0–37)
Albumin: 4.2 g/dL (ref 3.5–5.2)
Alkaline Phosphatase: 90 U/L (ref 39–117)
BUN: 16 mg/dL (ref 6–23)
CALCIUM: 9.7 mg/dL (ref 8.4–10.5)
CHLORIDE: 100 meq/L (ref 96–112)
CO2: 32 meq/L (ref 19–32)
CREATININE: 0.7 mg/dL (ref 0.40–1.20)
GFR: 91.45 mL/min (ref >60.00)
GLUCOSE: 88 mg/dL (ref 70–99)
Potassium: 4.7 mEq/L (ref 3.5–5.1)
Sodium: 138 mEq/L (ref 135–145)
Total Bilirubin: 0.6 mg/dL (ref 0.2–1.2)
Total Protein: 6.9 g/dL (ref 6.0–8.3)

## 2014-05-07 LAB — LDL CHOLESTEROL, DIRECT: Direct LDL: 82 mg/dL

## 2014-05-07 LAB — CBC WITH DIFFERENTIAL/PLATELET
BASOS ABS: 0 10*3/uL (ref 0.0–0.1)
Basophils Relative: 0.5 % (ref 0.0–3.0)
EOS ABS: 0.1 10*3/uL (ref 0.0–0.7)
EOS PCT: 1.2 % (ref 0.0–5.0)
HEMATOCRIT: 37.1 % (ref 36.0–46.0)
HEMOGLOBIN: 12.4 g/dL (ref 12.0–15.0)
Lymphocytes Relative: 27 % (ref 12.0–46.0)
Lymphs Abs: 2.4 10*3/uL (ref 0.7–4.0)
MCHC: 33.5 g/dL (ref 30.0–36.0)
MCV: 87.6 fl (ref 78.0–100.0)
MONO ABS: 0.7 10*3/uL (ref 0.1–1.0)
Monocytes Relative: 7.5 % (ref 3.0–12.0)
Neutro Abs: 5.6 10*3/uL (ref 1.4–7.7)
Neutrophils Relative %: 63.8 % (ref 43.0–77.0)
Platelets: 436 10*3/uL — ABNORMAL HIGH (ref 150.0–400.0)
RBC: 4.24 Mil/uL (ref 3.87–5.11)
RDW: 14.1 % (ref 11.5–15.5)
WBC: 8.8 10*3/uL (ref 4.0–10.5)

## 2014-05-07 LAB — LIPID PANEL
CHOL/HDL RATIO: 4
Cholesterol: 172 mg/dL (ref 0–200)
HDL: 39.2 mg/dL (ref >39.00)
NonHDL: 132.8
TRIGLYCERIDES: 379 mg/dL — AB (ref 0.0–149.0)
VLDL: 75.8 mg/dL — ABNORMAL HIGH (ref 0.0–40.0)

## 2014-05-07 LAB — VITAMIN D 25 HYDROXY (VIT D DEFICIENCY, FRACTURES): VITD: 16.71 ng/mL — ABNORMAL LOW (ref 30.00–100.00)

## 2014-05-07 LAB — MICROALBUMIN / CREATININE URINE RATIO
Creatinine,U: 38.1 mg/dL
MICROALB/CREAT RATIO: 1.8 mg/g (ref 0.0–30.0)

## 2014-05-07 LAB — HM MAMMOGRAPHY

## 2014-05-07 LAB — HEMOGLOBIN A1C: HEMOGLOBIN A1C: 5.7 % (ref 4.6–6.5)

## 2014-05-07 MED ORDER — ESCITALOPRAM OXALATE 20 MG PO TABS: 20.0000 mg | ORAL_TABLET | Freq: Every day | ORAL | Status: DC

## 2014-05-07 MED ORDER — LOSARTAN POTASSIUM 100 MG PO TABS: 100.0000 mg | ORAL_TABLET | Freq: Every day | ORAL | Status: DC

## 2014-05-07 MED ORDER — AMLODIPINE BESYLATE 5 MG PO TABS: 5.0000 mg | ORAL_TABLET | Freq: Every day | ORAL | Status: DC

## 2014-05-07 MED ORDER — LEVOTHYROXINE SODIUM 125 MCG PO TABS: 125.0000 ug | ORAL_TABLET | Freq: Every day | ORAL | Status: DC

## 2014-05-07 MED ORDER — ATORVASTATIN CALCIUM 40 MG PO TABS: 40.0000 mg | ORAL_TABLET | Freq: Every day | ORAL | Status: DC

## 2014-05-07 MED ORDER — ZOLPIDEM TARTRATE 10 MG PO TABS: 10.0000 mg | ORAL_TABLET | Freq: Every evening | ORAL | Status: DC | PRN

## 2014-05-07 MED ORDER — CLONAZEPAM 1 MG PO TABS: ORAL_TABLET | ORAL | Status: DC

## 2014-05-07 NOTE — Assessment & Plan Note (Signed)
Mammogram scheduled. Continue on Femara. Follow up with Dr. Bary Castilla as scheduled.

## 2014-05-07 NOTE — Progress Notes (Signed)
Subjective:    Patient ID: Christie Ramirez, female    DOB: 1955-10-09, 58 y.o.   MRN: [id number]  HPI  59YO female presents for annual exam.  Last week had flu and stomach bug. Given IVF at cancer center. Feeling better this week.  Aside from this doing well. No concerns today. Trying to follow healthy diet and stay active. Mammogram scheduled for tomorrow. Colonoscopy due in 2017.  Past medical, surgical, family and social history per today's encounter.  Review of Systems  Constitutional: Negative for fever, chills, appetite change, fatigue and unexpected weight change.  Eyes: Negative for visual disturbance.  Respiratory: Negative for shortness of breath.   Cardiovascular: Negative for chest pain and leg swelling.  Gastrointestinal: Negative for nausea, vomiting, abdominal pain, diarrhea, constipation and blood in stool.  Musculoskeletal: Negative for myalgias and arthralgias.  Skin: Negative for color change and rash.  Hematological: Negative for adenopathy. Does not bruise/bleed easily.  Psychiatric/Behavioral: Negative for sleep disturbance and dysphoric mood. The patient is not nervous/anxious.        Objective:    BP 107/74 mmHg  Pulse 89  Temp(Src) 97.7 F (36.5 C) (Oral)  Ht 5\' 1"  (1.549 m)  Wt 151 lb 4 oz (68.607 kg)  BMI 28.59 kg/m2  SpO2 95% Physical Exam  Constitutional: She is oriented to person, place, and time. She appears well-developed and well-nourished. No distress.  HENT:  Head: Normocephalic and atraumatic.  Right Ear: External ear normal.  Left Ear: External ear normal.  Nose: Nose normal.  Mouth/Throat: Oropharynx is clear and moist. No oropharyngeal exudate.  Eyes: Conjunctivae are normal. Pupils are equal, round, and reactive to light. Right eye exhibits no discharge. Left eye exhibits no discharge. No scleral icterus.  Neck: Normal range of motion. Neck supple. No tracheal deviation present. No thyromegaly present.  Cardiovascular: Normal  rate, regular rhythm, normal heart sounds and intact distal pulses.  Exam reveals no gallop and no friction rub.   No murmur heard. Pulmonary/Chest: Effort normal and breath sounds normal. No accessory muscle usage. No tachypnea. No respiratory distress. She has no decreased breath sounds. She has no wheezes. She has no rales. She exhibits no tenderness. Right breast exhibits no inverted nipple, no mass, no nipple discharge, no skin change and no tenderness. Left breast exhibits no inverted nipple, no mass, no nipple discharge, no skin change and no tenderness. Breasts are symmetrical.  Abdominal: Soft. Bowel sounds are normal. She exhibits no distension and no mass. There is no tenderness. There is no rebound and no guarding.  Musculoskeletal: Normal range of motion. She exhibits no edema or tenderness.  Lymphadenopathy:    She has no cervical adenopathy.  Neurological: She is alert and oriented to person, place, and time. No cranial nerve deficit. She exhibits normal muscle tone. Coordination normal.  Skin: Skin is warm and dry. No rash noted. She is not diaphoretic. No erythema. No pallor.  Psychiatric: She has a normal mood and affect. Her behavior is normal. Judgment and thought content normal.          Assessment & Plan:   Problem List Items Addressed This Visit      Unprioritized   Breast cancer of upper-inner quadrant of right female breast    Mammogram scheduled. Continue on Femara. Follow up with Dr. Bary Castilla as scheduled.      Relevant Medications   clonazePAM (KLONOPIN) 1 MG tablet   Hypertension   Relevant Medications   losartan (COZAAR) 100 MG tablet  atorvastatin (LIPITOR) 40 MG tablet   amLODipine (NORVASC) 5 MG tablet   Routine general medical examination at a health care facility - Primary    General medical exam normal today including breast exam. PAP and pelvic deferred as normal 2014, followed by Dr. Enzo Bi. Mammogram scheduled. Colonosocopy UTD. Encouraged  healthy diet and exercise. Labs today. Flu vaccine declined.      Relevant Orders   CBC with Differential/Platelet   Comprehensive metabolic panel   Lipid panel   Vit D  25 hydroxy (rtn osteoporosis monitoring)   Microalbumin / creatinine urine ratio   TSH   Hemoglobin A1c       Return in about 6 months (around 11/06/2014) for Recheck.

## 2014-05-07 NOTE — Patient Instructions (Signed)

## 2014-05-07 NOTE — Addendum Note (Signed)
Addended by: Vernetta Honey on: 05/07/2014 09:13 AM   Modules accepted: Orders

## 2014-05-07 NOTE — Assessment & Plan Note (Signed)
General medical exam normal today including breast exam. PAP and pelvic deferred as normal 2014, followed by Dr. Enzo Bi. Mammogram scheduled. Colonosocopy UTD. Encouraged healthy diet and exercise. Labs today. Flu vaccine declined.

## 2014-05-07 NOTE — Progress Notes (Signed)
Pre visit review using our clinic review tool, if applicable. No additional management support is needed unless otherwise documented below in the visit note. 

## 2014-05-08 ENCOUNTER — Encounter: Payer: Self-pay | Admitting: General Surgery

## 2014-05-16 NOTE — Op Note (Signed)
PATIENT NAME:  Christie Ramirez, Christie Ramirez MR#:  [id number] DATE OF BIRTH:  [birth year]  DATE OF PROCEDURE:  05/01/2013  PREOPERATIVE DIAGNOSIS: Right breast cancer.   POSTOPERATIVE DIAGNOSIS: Right breast cancer.    OPERATIVE PROCEDURE: Wide local excision, mastoplasty, sentinel node biopsy.   SURGEON: Hervey Ard, M.D.   ANESTHESIA: General endotracheal.   ESTIMATED BLOOD LOSS: Less than 50 mL.   CLINICAL NOTE: This 59 year old woman was noted with a mammographic abnormality  and underwent ultrasound-guided core biopsy showing evidence of invasive tubular carcinoma. She desired breast conservation. She is brought to the operating room for planned excision. She received clindamycin 900 mg intravenously due to a reported allergy to PENICILLIN.   DESCRIPTION OF PROCEDURE: With the patient under adequate general anesthesia, the breast was prepped with ChloraPrep and draped. The axillary area was approached first. Methylene blue 4 mL diluted 1:2 with saline (total volume 4 mL) had been injected prior to skin preparation. Area of increased uptake in the right axilla was identified and 0.5% Marcaine with 1:200,000 units epinephrine was used for local anesthesia and postoperative comfort. A total of 30 mL was used during the procedure. The skin was incised sharply and the remaining dissection completed with electrocautery. The lymph nodes were identified and sent fresh to pathology. Two sentinel nodes and 1 nonsentinel node were identified and reported as negative for macro metastatic disease. The wound was subsequently closed with 2-0 Vicryl figure-of-eight sutures in the deep layer and a running 4-0 Vicryl subcuticular suture for the skin.   Ultrasound was used to identify the borders of the previous biopsy site. A 5 x 6 x 7 block of tissue was subsequently excised. A curvilinear incision in the upper inner quadrant of the breast at the 1 o'clock position was made and carried down through the skin and  subcutaneous tissue after local anesthesia was infiltrated. The dissection was completed with electrocautery. The dissection extended down to and included the underlying pectoralis fascia. Specimen was orientated and specimen radiograph confirmed the previously placed clip present. Gross examination showed negative margins. Mastoplasty was undertaken by elevating the breast off the underlying pectoralis fascia for a minimum of 5 cm distance circumferentially. This was then approximated in a radial fashion to maintain the location of the nipple-areolar complex in its normal position. The subsequent layers were approximated in an antiradial fashion. These layers were closed with 2-0 Vicryl figure-of-eight sutures. The skin was then approximated with a running 4-0 Vicryl subcuticular suture. Benzoin, Steri-Strips, Telfa and Tegaderm dressings were then applied.   The patient tolerated the procedure well and was taken to the recovery room in stable condition.   ____________________________ Robert Bellow, MD jwb:gb D: 05/02/2013 21:52:37 ET T: 05/02/2013 23:42:42 ET JOB#: [id number]  cc: Robert Bellow, MD, <Dictator> Jerrell Belfast, MD Martie Lee. Oliva Bustard, MD  Karron Alvizo Amedeo Kinsman MD ELECTRONICALLY SIGNED 05/04/2013 19:57

## 2014-05-16 NOTE — Consult Note (Signed)
Reason for Visit: This 59 year old Female patient presents to the clinic for initial evaluation of  breast cancer .   Referred by Dr. Bary Castilla.  Diagnosis:  Chief Complaint/Diagnosis   59 year old female socials wide local excision and sentinel biopsy for ER/PR positive HER-2/neu negative stage I (T1 a. N0 M0) invasive mammary carcinoma. 4 adjuvant radiation therapy and aromatase inhibitor.  Pathology Report pathology report reviewed   Imaging Report mammograms reviewed   Referral Report clinical notes reviewed   Planned Treatment Regimen possible accelerated partial breast irradiation plus remote ACE inhibitor   HPI   patient is a pleasant 59 year old female who presents with an abnormal mammogram of the right breast showing a possible 1.5 cm lesion. Biopsy was positive for low-grade carcinoma. Status post wide local excision and sentinel node biopsy for a 5 mm invasive mammary carcinoma overall grade 1. On reexcision there was no residual tumor seen. 4 sentinel lymph nodes were negative. Again tumor was ER/PR positive HER-2/neu not over expressed. Patient has done extremely well postoperatively she is out 1 week from her surgery and is doing well. Specifically denies breast tenderness cough or bone pain.  Past Hx:    HTN:    Anemia:    Breast Cancer:    Thyroid Cancer:    Anxiety:    Hypercholesterolemia:    Hysterectomy - Total:    Right Foot surgery:    Colectomy: Laparoscopic assisted right colectomy, 09-Jan-2007   dilatation and curettage:    tonsillectomy:    hysterectomy with BSO:    total thyroidectomy:   Past, Family and Social History:  Past Medical History positive   Cardiovascular hyperlipidemia; hypertension   Neurological/Psychiatric anxiety   Past Surgical History total thyroidectomy, hysterectomy and BSO, tonsillectomy, D&C, right foot surgery   Past Medical History Comments anemia, thyroid cancer 1999 status post thyroidectomy   Family  History positive   Family History Comments father with leukemia maternal aunt with breast cancer   Social History noncontributory   Additional Past Medical and Surgical History seen by herself today   Allergies:   Codeine: Resp. Distress  IVP Dye: Hives  PCN: Hives  Ace Inhibitors: Unknown  Home Meds:  Home Medications: Medication Instructions Status  traMADol 50 mg oral tablet 1 tab(s) orally every 4 hours, As needed, pain Active  Tylenol 325 mg oral tablet 2 tab(s) orally every 4 hours, As Needed - for Pain Active  Aleve sodium 220 mg oral tablet 2 tab(s) orally 2 times a day for five days, then as needed for soreness.  Active  atorvastatin 40 mg oral tablet 1 tab(s) orally once a day (at bedtime) Active  escitalopram 20 mg oral tablet 1 tab(s) orally once a day Active  levothyroxine 150 mcg (0.15 mg) oral tablet 1 tab(s) orally once a day Active  losartan 100 mg oral tablet 1 tab(s) orally once a day (at bedtime) Active  multivitamin 1 tab(s) orally once a day Active  Ambien 10 mg oral tablet 1 tab(s) orally once a day (at bedtime) Active  aspirin 81 mg oral tablet 1 tab(s) orally once a day Active  amLODIPine 5 mg oral tablet 1 tab(s) orally once a day (at bedtime) Active  clonazePAM 1 mg oral tablet 1 tab(s) orally 2 times a day Active   Review of Systems:  General negative   Performance Status (ECOG) 0   Skin negative   Breast see HPI   Ophthalmologic negative   ENMT negative   Respiratory and Thorax negative  Cardiovascular negative   Gastrointestinal negative   Genitourinary negative   Musculoskeletal negative   Neurological negative   Psychiatric negative   Hematology/Lymphatics negative   Endocrine negative   Allergic/Immunologic negative   Review of Systems   review of systems obtained from nurses notes  Physical Exam:  General/Skin/HEENT:  General normal   Skin normal   Eyes normal   ENMT normal   Head and Neck normal    Additional PE well-developed slightly obese female in NAD.right breast is wide local excision scar which is healing well. She has ecchymosis more lateralized to the medial portion of herright breast. No dominant mass or nodularity is noted in either breast in 2 positions examined. No axillary or supraclavicular adenopathy is appreciated.   Breasts/Resp/CV/GI/GU:  Respiratory and Thorax normal   Cardiovascular normal   Gastrointestinal normal   Genitourinary normal   MS/Neuro/Psych/Lymph:  Musculoskeletal normal   Neurological normal   Lymphatics normal   LAB Results:  Laboratory Results: Pathology:  09-Apr-15 00:12   Pathology Report ========== TEST NAME ==========  ========= RESULTS =========  = REFERENCE RANGE =  PATHOLOGY REPORT  Pathology Report .                               [   Final Report         ]                   Material submitted:         Marland Kitchen PART A: SENTINEL LYMPH NODE #1 AND #2 PART B: NON SENTINEL LYMPH NODE PART C: RIGHT BREAST WIDE EXCISION .                               [   Final Report         ]                   Pre-operative diagnosis:           . RIGHT BREAST CANCER SENTINEL NODES #1 AND #2, NON-SENTINEL LYMPH NODE, RIGHT BREAST WIDE EXCISION .                               [   Final Report         ]                   Frozen section diagnosis:      . FSA1-A3. SENTINEL LYMPH NODE #1 AND #2, EXCISION:          - 3 SENTINEL LYMPH NODES NEGATIVE FOR MACROMETASTASIS            (0/3).          - CALLED TO DR. BYRNETT AT 11:13 AM ON 05/01/13. Marland Kitchen IOCC1. RIGHT BREAST, WIDE EXCISION:        - BIOPSY CAVITY PRESENT. NO TUMOR GROSSLY SEEN.          MARGINS NEGATIVE.        - CALLED TO DR. BYRNETT AT 11:32 AM ON 05/01/13. . DR. DANA BAKER /KCT .                               [   Final Report         ]                   **********************************************************************  Diagnosis: Part A: SENTINEL LYMPH NODE #1 AND #2: - NO TUMOR SEEN  IN THREE SENTINEL LYMPH NODES (0/3). . Part B: NON SENTINEL LYMPH NODE: - NO TUMOR SEEN IN 1 LYMPH NODE (0/1). . Part C: RIGHT BREAST WIDE EXCISION: - NEGATIVE FOR RESIDUAL MALIGNANCY. - BIOPSY SITE CHANGES, MARKER MATERIAL PRESENT. - BENIGN FIBROADENOMA, 7 MM. - SEE SUMMARY BELOW. Marland Kitchen CANCER CASE SUMMARY: INVASIVE CARCINOMA OF THE BREAST - Summary includes findings from case [id number] PROCEDURE: Excision without wire-guided localization LYMPH NODE SAMPLING: Sentinel and non-sentinel lymph nodes SPECIMEN LATERALITY: Right HISTOLOGIC TYPE OF INVASIVE CARCINOMA: Ductal, no special type TUMOR SIZE: Largest linear focus 5 mm HISTOLOGIC GRADE: Nottingham Histologic Score Glandular / tubular differentiation: Score 1 Nuclear pleomorphism: Score 1 Mitotic rate: Score 1 Overall grade: Grade 1 TUMOR FOCALITY: Single focus of invasive carcinoma DUCTAL CARCINOMA IN SITU (DCIS): DCIS is present in biopsy specimen. MARGINS: No residual tumor seen in histologic sections LYMPH NODES:         Number of sentinel lymph nodes examined: 3         Total number of lymph nodes examined: 4         Number of lymph nodes with macrometastases: 0         Number of lymph nodes with micrometastases: 0         Number of lymph nodes with isolated tumor cells: 0         Number of lymph nodes without tumor cells identified: 4 PATHOLOGIC STAGING, AJCC 7TH EDITION: Primary tumor: pT1a Regional lymph nodes: pN0 Distant metastasis: not applicable ANCILLARY STUDIES: Testing was performed by [facility] for Molecular Biology and Pathology on a separate specimen. In summary:         Estrogen receptor IHC: Positive         Progesterone receptor: Positive         HER2-neu FISH: NOT amplified (Negative) . . . XDB/05/02/2013 ********************************************************************** .                               [   Final Report  ]                   Electronically signed:                                      . Lum Babe, MD, Pathologist .                               [   Final Report         ]                   Gross description:  . A. Received fresh for frozen section labeled Christie Ramirez and sentinel node #1 and #2 is a 3.0 x 2.0 x 1.5 cm unoriented fragment of yellow tan lobulated fibroadipose tissue. Palpation demonstrates three lymph node candidates ranging from 1.7 to 1.9 cm in greatest dimensions. The lymph nodes are entirely submitted for frozen section: smallest node FSA1, middle sized node FSA2, largest node bisected FSA3. The frozen section remnants are entirely resubmitted in cassettes A1-A3, respectively. . B. Received fresh labeled Christie Ramirez and non sentinel lymph node is a 3.0 x 2.0 x 0.7 cm unoriented fragment  of yellow tan lobulated fibroadipose tissue. Palpation demonstrates two possible lymph node candidates 2.2 and 2.1 cm in greatest dimensions. The lymph nodes are entirely submitted: B1 - one intact lymph node candidate; B2 - one intact lymph node candidate. . C. Received fresh labeled Christie Ramirez and right breast, wide excision, is a 9.0 x 7.8 x 4.5 cm fragment of yellow tan lobulated fibroadipose tissue. An accompanying xray demonstrates an opaque marker within the specimen. The tissue overlying the marker is inked yellow. Silver metallic tags designate the cranial, lateral, medial margins. The margins are inked as follows: Anterior - orange. Posterior - black. Medial - green. Lateral - blue. Sectioning reveals a 2.3 x 1.7 x 1.5 cm hemorrhagic biopsy cavity situated 0.5 cm from the anterior margin and >2 cm from the remaining margins.The cavity is surrounded by splotchy hemorrhage and apparent fat necrosis with focal areas of induration. No discrete residual mass is grossly identified. Elsewhere the cut surfaces are predominantly fatty with focal areas of denser fibrous tissue. The denser fibrous tissue grossly extends to  the inferior margin. Representative sections are submitted: C1 - superior margin, perpendicular sections; C2-C3 - biopsy cavity and anterior margin; C4-C6 - additional representative sections of biopsy cavity; C7 - representative section of fibrous tissue; C8 - inferior margin, perpendicular sections. . The specimen is collected at 11:05 am and placed in formalin at 11:39 am on May 01, 2013. The approximate total fixation time is 6 hours. QAC/KCT .                               [   Final Report         ]                   Pathologist provided ICD-9: 35 .                               [   Final Report         ]                   CPT        . [id number], [id number], [id number], [id number], [id number], [phone], [place]            No: [id number]           [address], [place], [place] [address]           Lindon Romp, MD         [phone]                              [id number]   Result(s) reported on 03 May 2013 at 06:22AM.   Relevent Results:   Relevant Scans and Labs mammograms were reviewed   Assessment and Plan: Impression:   stage I invasive mammary carcinoma the right breast in 58 year old female ER/PR positive candidate for aromatase inhibitor plus adjuvant radiation therapy. Not a candidate based on small tumor size for systemic chemotherapy. Plan:   I discussed the case personally with medical oncology. Patient is not a candidate for systemic chemotherapy based on the small nature of her lesion. She has been offered and discussed both accelerated partial breast radiation and whole  breast radiation. Risks and benefits of both procedures was discussed. Patient is leaning towards MammoSite catheter placement. I discussed the risks and benefits of treatment including possible small skin reaction fatigue and permanent scarring and the lumpectomy site. She will be making her decision regarding treatment shortly. Should MammoSite  catheter balloon not be able to be placed or 2 close to the skin surface we'll go ahead with whole breast radiation. Patient will also be candidate for aromatase inhibitor after completion of radiation.  I would like to take this opportunity for allowing me to participate in the care of your patient..  CC Referral:  cc: Dr. Bary Castilla, Dr. Margarita Rana   Electronic Signatures: Baruch Gouty, Roda Shutters (MD)  (Signed 16-Apr-15 15:05)  Authored: HPI, Diagnosis, Past Hx, PFSH, Allergies, Home Meds, ROS, Physical Exam, LAB Results, Relevent Results, Encounter Assessment and Plan, CC Referring Physician   Last Updated: 16-Apr-15 15:05 by Armstead Peaks (MD)

## 2014-05-18 ENCOUNTER — Ambulatory Visit: Payer: Self-pay | Admitting: General Surgery

## 2014-05-26 ENCOUNTER — Encounter: Payer: Self-pay | Admitting: General Surgery

## 2014-05-26 ENCOUNTER — Ambulatory Visit (INDEPENDENT_AMBULATORY_CARE_PROVIDER_SITE_OTHER): Payer: BLUE CROSS/BLUE SHIELD | Admitting: General Surgery

## 2014-05-26 VITALS — BP 122/70 | HR 76 | Resp 12 | Ht 61.0 in | Wt 146.0 lb

## 2014-05-26 DIAGNOSIS — C50211 Malignant neoplasm of upper-inner quadrant of right female breast: Secondary | ICD-10-CM

## 2014-05-26 NOTE — Progress Notes (Signed)
Patient ID: Christie Ramirez, female   DOB: Oct 01, 1955, 59 y.o.   MRN: [id number]  Chief Complaint  Patient presents with  . Follow-up    mammogram    HPI Christie Ramirez is a 59 y.o. female who presents for a breast evaluation. The most recent mammogram was done on 05/08/14.Marland Kitchen  Patient does perform regular self breast checks and gets regular mammograms done.    HPI  Past Medical History  Diagnosis Date  . Hyperlipidemia   . Hypothyroidism   . Anxiety   . Endometriosis 2009  . Cancer 1995    thyroid  . 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     Past Surgical History  Procedure Laterality Date  . Colectomy  2009    secondary to endometriosis DR. Smith  . Colonoscopy  2009    Dr. Tamala Julian  . Foot surgery  right  . Abdominal hysterectomy  2005    Martin DeFrancesco,MD  . Breast biopsy Right 04-02-13    INVASIVE MAMMARY CARCINOMA WITH FEATURES OF TUBULAR CARCINOMA,  . Breast surgery Right 05/01/13    wide excision  . 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 No malignancy.    Family History  Problem Relation Age of Onset  . Colon polyps Father     age 54    Social History History  Substance Use Topics  . Smoking status: Never Smoker   . Smokeless tobacco: Never Used  . Alcohol Use: 0.0 oz/week    0 Standard drinks or equivalent per week     Comment: wine    Allergies  Allergen Reactions  . Codeine Anaphylaxis  . Ace Inhibitors   . Iodinated Diagnostic Agents Hives  . Penicillins Rash    Current Outpatient Prescriptions  Medication Sig Dispense Refill  . amLODipine (NORVASC) 5 MG tablet Take 1 tablet (5 mg total) by mouth daily. 90 tablet 3  . aspirin 81 MG tablet Take 81 mg by mouth daily.    Marland Kitchen atorvastatin (LIPITOR) 40 MG tablet Take 1 tablet (40 mg total) by mouth daily. 90 tablet 3  . clonazePAM  (KLONOPIN) 1 MG tablet TAKE 1 TABLET BY MOUTH 3 TIMES A DAY AS NEEDED 90 tablet 3  . escitalopram (LEXAPRO) 20 MG tablet Take 1 tablet (20 mg total) by mouth daily. 90 tablet 3  . letrozole (FEMARA) 2.5 MG tablet Take 2.5 mg by mouth daily.     Marland Kitchen levothyroxine (SYNTHROID, LEVOTHROID) 125 MCG tablet Take 1 tablet (125 mcg total) by mouth daily. 90 tablet 3  . losartan (COZAAR) 100 MG tablet Take 1 tablet (100 mg total) by mouth daily. 90 tablet 3  . Multiple Vitamins-Minerals (MULTIVITAMIN WITH MINERALS) tablet Take 1 tablet by mouth daily.    Marland Kitchen zolpidem (AMBIEN) 10 MG tablet Take 1 tablet (10 mg total) by mouth at bedtime as needed. 30 tablet 4   No current facility-administered medications for this visit.    Review of Systems Review of Systems  Constitutional: Negative.   Respiratory: Negative.   Cardiovascular: Negative.     Blood pressure 122/70, pulse 76, resp. rate 12, height 5\' 1"  (1.549 m), weight 66.225 kg (146 lb).  Physical Exam Physical Exam  Constitutional: She is oriented to person, place, and time. She appears well-developed and well-nourished.  Eyes: Conjunctivae are normal. No scleral icterus.  Neck: Neck supple.  Cardiovascular: Normal rate, regular rhythm and normal heart sounds.   Pulmonary/Chest: Effort normal and breath sounds normal. Right breast exhibits no inverted nipple, no mass, no nipple discharge, no skin change and no tenderness. Left breast exhibits no inverted nipple, no mass, no nipple discharge, no skin change and no tenderness.    Well healed incision from 12 to 2 on right breast.    Lymphadenopathy:    She has no cervical adenopathy.  Neurological: She is alert and oriented to person, place, and time.  Skin: Skin is warm and dry.    Data Reviewed Recently completed mammogram dated 05/08/2014 at [facility] was reviewed. Note is made of a small area of calcifications in the left breast which were evaluated with additional views. And I  appearance. BI-RADS-2.  The films were reviewed with the patient. There is been a significant decrease in breast parenchymal volume since initiation of antiestrogen therapy. The calcifications could possibly been present before now more pronounced. No associating distortion of the breast parenchyma. Low suspicion for malignancy.  Assessment    Doing well status post treatment for right breast invasive carcinoma.  Left breast microcalcifications, benign.    Plan    Options for management of the new microcalcification on the right breast were reviewed: 1) early biopsy versus 2) 6 month mammogram follow-up versus 3 was ( one year follow-up as recommended by radiology.  The patient has been asked to return to the office in six months with a Unilateral left diagnostic mammogram.   PCP:  Sonnie Alamo 05/27/2014, 7:07 AM

## 2014-06-09 ENCOUNTER — Ambulatory Visit (INDEPENDENT_AMBULATORY_CARE_PROVIDER_SITE_OTHER): Payer: BLUE CROSS/BLUE SHIELD | Admitting: Internal Medicine

## 2014-06-09 ENCOUNTER — Encounter: Payer: Self-pay | Admitting: Internal Medicine

## 2014-06-09 VITALS — BP 122/86 | HR 96 | Temp 97.9°F | Wt 150.0 lb

## 2014-06-09 DIAGNOSIS — M5442 Lumbago with sciatica, left side: Secondary | ICD-10-CM

## 2014-06-09 MED ORDER — METHYLPREDNISOLONE ACETATE 80 MG/ML IJ SUSP
80.0000 mg | Freq: Once | INTRAMUSCULAR | Status: AC
  Administered 2014-06-09: 80 mg via INTRAMUSCULAR

## 2014-06-09 NOTE — Patient Instructions (Signed)
Sciatica with Rehab The sciatic nerve runs from the back down the leg and is responsible for sensation and control of the muscles in the back (posterior) side of the thigh, lower leg, and foot. Sciatica is a condition that is characterized by inflammation of this nerve.  SYMPTOMS   Signs of nerve damage, including numbness and/or weakness along the posterior side of the lower extremity.  Pain in the back of the thigh that may also travel down the leg.  Pain that worsens when sitting for long periods of time.  Occasionally, pain in the back or buttock. CAUSES  Inflammation of the sciatic nerve is the cause of sciatica. The inflammation is due to something irritating the nerve. Common sources of irritation include:  Sitting for long periods of time.  Direct trauma to the nerve.  Arthritis of the spine.  Herniated or ruptured disk.  Slipping of the vertebrae (spondylolisthesis).  Pressure from soft tissues, such as muscles or ligament-like tissue (fascia). RISK INCREASES WITH:  Sports that place pressure or stress on the spine (football or weightlifting).  Poor strength and flexibility.  Failure to warm up properly before activity.  Family history of low back pain or disk disorders.  Previous back injury or surgery.  Poor body mechanics, especially when lifting, or poor posture. PREVENTION   Warm up and stretch properly before activity.  Maintain physical fitness:  Strength, flexibility, and endurance.  Cardiovascular fitness.  Learn and use proper technique, especially with posture and lifting. When possible, have coach correct improper technique.  Avoid activities that place stress on the spine. PROGNOSIS If treated properly, then sciatica usually resolves within 6 weeks. However, occasionally surgery is necessary.  RELATED COMPLICATIONS   Permanent nerve damage, including pain, numbness, tingle, or weakness.  Chronic back pain.  Risks of surgery: infection,  bleeding, nerve damage, or damage to surrounding tissues. TREATMENT Treatment initially involves resting from any activities that aggravate your symptoms. The use of ice and medication may help reduce pain and inflammation. The use of strengthening and stretching exercises may help reduce pain with activity. These exercises may be performed at home or with referral to a therapist. A therapist may recommend further treatments, such as transcutaneous electronic nerve stimulation (TENS) or ultrasound. Your caregiver may recommend corticosteroid injections to help reduce inflammation of the sciatic nerve. If symptoms persist despite non-surgical (conservative) treatment, then surgery may be recommended. MEDICATION  If pain medication is necessary, then nonsteroidal anti-inflammatory medications, such as aspirin and ibuprofen, or other minor pain relievers, such as acetaminophen, are often recommended.  Do not take pain medication for 7 days before surgery.  Prescription pain relievers may be given if deemed necessary by your caregiver. Use only as directed and only as much as you need.  Ointments applied to the skin may be helpful.  Corticosteroid injections may be given by your caregiver. These injections should be reserved for the most serious cases, because they may only be given a certain number of times. HEAT AND COLD  Cold treatment (icing) relieves pain and reduces inflammation. Cold treatment should be applied for 10 to 15 minutes every 2 to 3 hours for inflammation and pain and immediately after any activity that aggravates your symptoms. Use ice packs or massage the area with a piece of ice (ice massage).  Heat treatment may be used prior to performing the stretching and strengthening activities prescribed by your caregiver, physical therapist, or athletic trainer. Use a heat pack or soak the injury in warm water.   SEEK MEDICAL CARE IF:  Treatment seems to offer no benefit, or the condition  worsens.  Any medications produce adverse side effects. EXERCISES  RANGE OF MOTION (ROM) AND STRETCHING EXERCISES - Sciatica Most people with sciatic will find that their symptoms worsen with either excessive bending forward (flexion) or arching at the low back (extension). The exercises which will help resolve your symptoms will focus on the opposite motion. Your physician, physical therapist or athletic trainer will help you determine which exercises will be most helpful to resolve your low back pain. Do not complete any exercises without first consulting with your clinician. Discontinue any exercises which worsen your symptoms until you speak to your clinician. If you have pain, numbness or tingling which travels down into your buttocks, leg or foot, the goal of the therapy is for these symptoms to move closer to your back and eventually resolve. Occasionally, these leg symptoms will get better, but your low back pain may worsen; this is typically an indication of progress in your rehabilitation. Be certain to be very alert to any changes in your symptoms and the activities in which you participated in the 24 hours prior to the change. Sharing this information with your clinician will allow him/her to most efficiently treat your condition. These exercises may help you when beginning to rehabilitate your injury. Your symptoms may resolve with or without further involvement from your physician, physical therapist or athletic trainer. While completing these exercises, remember:   Restoring tissue flexibility helps normal motion to return to the joints. This allows healthier, less painful movement and activity.  An effective stretch should be held for at least 30 seconds.  A stretch should never be painful. You should only feel a gentle lengthening or release in the stretched tissue. FLEXION RANGE OF MOTION AND STRETCHING EXERCISES: STRETCH - Flexion, Single Knee to Chest   Lie on a firm bed or floor  with both legs extended in front of you.  Keeping one leg in contact with the floor, bring your opposite knee to your chest. Hold your leg in place by either grabbing behind your thigh or at your knee.  Pull until you feel a gentle stretch in your low back. Hold __________ seconds.  Slowly release your grasp and repeat the exercise with the opposite side. Repeat __________ times. Complete this exercise __________ times per day.  STRETCH - Flexion, Double Knee to Chest  Lie on a firm bed or floor with both legs extended in front of you.  Keeping one leg in contact with the floor, bring your opposite knee to your chest.  Tense your stomach muscles to support your back and then lift your other knee to your chest. Hold your legs in place by either grabbing behind your thighs or at your knees.  Pull both knees toward your chest until you feel a gentle stretch in your low back. Hold __________ seconds.  Tense your stomach muscles and slowly return one leg at a time to the floor. Repeat __________ times. Complete this exercise __________ times per day.  STRETCH - Low Trunk Rotation   Lie on a firm bed or floor. Keeping your legs in front of you, bend your knees so they are both pointed toward the ceiling and your feet are flat on the floor.  Extend your arms out to the side. This will stabilize your upper body by keeping your shoulders in contact with the floor.  Gently and slowly drop both knees together to one side until   you feel a gentle stretch in your low back. Hold for __________ seconds.  Tense your stomach muscles to support your low back as you bring your knees back to the starting position. Repeat the exercise to the other side. Repeat __________ times. Complete this exercise __________ times per day  EXTENSION RANGE OF MOTION AND FLEXIBILITY EXERCISES: STRETCH - Extension, Prone on Elbows  Lie on your stomach on the floor, a bed will be too soft. Place your palms about shoulder  width apart and at the height of your head.  Place your elbows under your shoulders. If this is too painful, stack pillows under your chest.  Allow your body to relax so that your hips drop lower and make contact more completely with the floor.  Hold this position for __________ seconds.  Slowly return to lying flat on the floor. Repeat __________ times. Complete this exercise __________ times per day.  RANGE OF MOTION - Extension, Prone Press Ups  Lie on your stomach on the floor, a bed will be too soft. Place your palms about shoulder width apart and at the height of your head.  Keeping your back as relaxed as possible, slowly straighten your elbows while keeping your hips on the floor. You may adjust the placement of your hands to maximize your comfort. As you gain motion, your hands will come more underneath your shoulders.  Hold this position __________ seconds.  Slowly return to lying flat on the floor. Repeat __________ times. Complete this exercise __________ times per day.  STRENGTHENING EXERCISES - Sciatica  These exercises may help you when beginning to rehabilitate your injury. These exercises should be done near your "sweet spot." This is the neutral, low-back arch, somewhere between fully rounded and fully arched, that is your least painful position. When performed in this safe range of motion, these exercises can be used for people who have either a flexion or extension based injury. These exercises may resolve your symptoms with or without further involvement from your physician, physical therapist or athletic trainer. While completing these exercises, remember:   Muscles can gain both the endurance and the strength needed for everyday activities through controlled exercises.  Complete these exercises as instructed by your physician, physical therapist or athletic trainer. Progress with the resistance and repetition exercises only as your caregiver advises.  You may  experience muscle soreness or fatigue, but the pain or discomfort you are trying to eliminate should never worsen during these exercises. If this pain does worsen, stop and make certain you are following the directions exactly. If the pain is still present after adjustments, discontinue the exercise until you can discuss the trouble with your clinician. STRENGTHENING - Deep Abdominals, Pelvic Tilt   Lie on a firm bed or floor. Keeping your legs in front of you, bend your knees so they are both pointed toward the ceiling and your feet are flat on the floor.  Tense your lower abdominal muscles to press your low back into the floor. This motion will rotate your pelvis so that your tail bone is scooping upwards rather than pointing at your feet or into the floor.  With a gentle tension and even breathing, hold this position for __________ seconds. Repeat __________ times. Complete this exercise __________ times per day.  STRENGTHENING - Abdominals, Crunches   Lie on a firm bed or floor. Keeping your legs in front of you, bend your knees so they are both pointed toward the ceiling and your feet are flat on the   floor. Cross your arms over your chest.  Slightly tip your chin down without bending your neck.  Tense your abdominals and slowly lift your trunk high enough to just clear your shoulder blades. Lifting higher can put excessive stress on the low back and does not further strengthen your abdominal muscles.  Control your return to the starting position. Repeat __________ times. Complete this exercise __________ times per day.  STRENGTHENING - Quadruped, Opposite UE/LE Lift  Assume a hands and knees position on a firm surface. Keep your hands under your shoulders and your knees under your hips. You may place padding under your knees for comfort.  Find your neutral spine and gently tense your abdominal muscles so that you can maintain this position. Your shoulders and hips should form a rectangle  that is parallel with the floor and is not twisted.  Keeping your trunk steady, lift your right hand no higher than your shoulder and then your left leg no higher than your hip. Make sure you are not holding your breath. Hold this position __________ seconds.  Continuing to keep your abdominal muscles tense and your back steady, slowly return to your starting position. Repeat with the opposite arm and leg. Repeat __________ times. Complete this exercise __________ times per day.  STRENGTHENING - Abdominals and Quadriceps, Straight Leg Raise   Lie on a firm bed or floor with both legs extended in front of you.  Keeping one leg in contact with the floor, bend the other knee so that your foot can rest flat on the floor.  Find your neutral spine, and tense your abdominal muscles to maintain your spinal position throughout the exercise.  Slowly lift your straight leg off the floor about 6 inches for a count of 15, making sure to not hold your breath.  Still keeping your neutral spine, slowly lower your leg all the way to the floor. Repeat this exercise with each leg __________ times. Complete this exercise __________ times per day. POSTURE AND BODY MECHANICS CONSIDERATIONS - Sciatica Keeping correct posture when sitting, standing or completing your activities will reduce the stress put on different body tissues, allowing injured tissues a chance to heal and limiting painful experiences. The following are general guidelines for improved posture. Your physician or physical therapist will provide you with any instructions specific to your needs. While reading these guidelines, remember:  The exercises prescribed by your provider will help you have the flexibility and strength to maintain correct postures.  The correct posture provides the optimal environment for your joints to work. All of your joints have less wear and tear when properly supported by a spine with good posture. This means you will  experience a healthier, less painful body.  Correct posture must be practiced with all of your activities, especially prolonged sitting and standing. Correct posture is as important when doing repetitive low-stress activities (typing) as it is when doing a single heavy-load activity (lifting). RESTING POSITIONS Consider which positions are most painful for you when choosing a resting position. If you have pain with flexion-based activities (sitting, bending, stooping, squatting), choose a position that allows you to rest in a less flexed posture. You would want to avoid curling into a fetal position on your side. If your pain worsens with extension-based activities (prolonged standing, working overhead), avoid resting in an extended position such as sleeping on your stomach. Most people will find more comfort when they rest with their spine in a more neutral position, neither too rounded nor too   arched. Lying on a non-sagging bed on your side with a pillow between your knees, or on your back with a pillow under your knees will often provide some relief. Keep in mind, being in any one position for a prolonged period of time, no matter how correct your posture, can still lead to stiffness. PROPER SITTING POSTURE In order to minimize stress and discomfort on your spine, you must sit with correct posture Sitting with good posture should be effortless for a healthy body. Returning to good posture is a gradual process. Many people can work toward this most comfortably by using various supports until they have the flexibility and strength to maintain this posture on their own. When sitting with proper posture, your ears will fall over your shoulders and your shoulders will fall over your hips. You should use the back of the chair to support your upper back. Your low back will be in a neutral position, just slightly arched. You may place a small pillow or folded towel at the base of your low back for support.  When  working at a desk, create an environment that supports good, upright posture. Without extra support, muscles fatigue and lead to excessive strain on joints and other tissues. Keep these recommendations in mind: CHAIR:   A chair should be able to slide under your desk when your back makes contact with the back of the chair. This allows you to work closely.  The chair's height should allow your eyes to be level with the upper part of your monitor and your hands to be slightly lower than your elbows. BODY POSITION  Your feet should make contact with the floor. If this is not possible, use a foot rest.  Keep your ears over your shoulders. This will reduce stress on your neck and low back. INCORRECT SITTING POSTURES   If you are feeling tired and unable to assume a healthy sitting posture, do not slouch or slump. This puts excessive strain on your back tissues, causing more damage and pain. Healthier options include:  Using more support, like a lumbar pillow.  Switching tasks to something that requires you to be upright or walking.  Talking a brief walk.  Lying down to rest in a neutral-spine position. PROLONGED STANDING WHILE SLIGHTLY LEANING FORWARD  When completing a task that requires you to lean forward while standing in one place for a long time, place either foot up on a stationary 2-4 inch high object to help maintain the best posture. When both feet are on the ground, the low back tends to lose its slight inward curve. If this curve flattens (or becomes too large), then the back and your other joints will experience too much stress, fatigue more quickly and can cause pain.  CORRECT STANDING POSTURES Proper standing posture should be assumed with all daily activities, even if they only take a few moments, like when brushing your teeth. As in sitting, your ears should fall over your shoulders and your shoulders should fall over your hips. You should keep a slight tension in your abdominal  muscles to brace your spine. Your tailbone should point down to the ground, not behind your body, resulting in an over-extended swayback posture.  INCORRECT STANDING POSTURES  Common incorrect standing postures include a forward head, locked knees and/or an excessive swayback. WALKING Walk with an upright posture. Your ears, shoulders and hips should all line-up. PROLONGED ACTIVITY IN A FLEXED POSITION When completing a task that requires you to bend forward   at your waist or lean over a low surface, try to find a way to stabilize 3 of 4 of your limbs. You can place a hand or elbow on your thigh or rest a knee on the surface you are reaching across. This will provide you more stability so that your muscles do not fatigue as quickly. By keeping your knees relaxed, or slightly bent, you will also reduce stress across your low back. CORRECT LIFTING TECHNIQUES DO :   Assume a wide stance. This will provide you more stability and the opportunity to get as close as possible to the object which you are lifting.  Tense your abdominals to brace your spine; then bend at the knees and hips. Keeping your back locked in a neutral-spine position, lift using your leg muscles. Lift with your legs, keeping your back straight.  Test the weight of unknown objects before attempting to lift them.  Try to keep your elbows locked down at your sides in order get the best strength from your shoulders when carrying an object.  Always ask for help when lifting heavy or awkward objects. INCORRECT LIFTING TECHNIQUES DO NOT:   Lock your knees when lifting, even if it is a small object.  Bend and twist. Pivot at your feet or move your feet when needing to change directions.  Assume that you cannot safely pick up a paperclip without proper posture. Document Released: 01/09/2005 Document Revised: 05/26/2013 Document Reviewed: 04/23/2008 [employer] Patient Information 2015 [employer]. This information is not intended to  replace advice given to you by your health care provider. Make sure you discuss any questions you have with your health care provider.  

## 2014-06-09 NOTE — Progress Notes (Signed)
Subjective:    Patient ID: Christie Ramirez, female    DOB: [birth year], 59 y.o.   MRN: [id number]  HPI  Pt presents to the clinic today with left side back pain. This started yesterday. The pain radiates down her left leg. She describes the pain as sharp and shooting. She denies numbness or tingling in the leg. Sitting down and laying down make the pain worse. Heat helps to relieve the pain. She has not tried anything orally OTC. She denies any injury to the area.  Review of Systems      Past Medical History  Diagnosis Date  . Hyperlipidemia   . Hypothyroidism   . Anxiety   . Endometriosis 2009  . Cancer 1995    thyroid  . 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     Current Outpatient Prescriptions  Medication Sig Dispense Refill  . amLODipine (NORVASC) 5 MG tablet Take 1 tablet (5 mg total) by mouth daily. 90 tablet 3  . aspirin 81 MG tablet Take 81 mg by mouth daily.    Marland Kitchen atorvastatin (LIPITOR) 40 MG tablet Take 1 tablet (40 mg total) by mouth daily. 90 tablet 3  . clonazePAM (KLONOPIN) 1 MG tablet TAKE 1 TABLET BY MOUTH 3 TIMES A DAY AS NEEDED 90 tablet 3  . dextromethorphan-guaiFENesin (MUCINEX DM) 30-600 MG per 12 hr tablet Take 1 tablet by mouth 2 (two) times daily.    Marland Kitchen escitalopram (LEXAPRO) 20 MG tablet Take 1 tablet (20 mg total) by mouth daily. 90 tablet 3  . letrozole (FEMARA) 2.5 MG tablet Take 2.5 mg by mouth daily.     Marland Kitchen levothyroxine (SYNTHROID, LEVOTHROID) 125 MCG tablet Take 1 tablet (125 mcg total) by mouth daily. 90 tablet 3  . losartan (COZAAR) 100 MG tablet Take 1 tablet (100 mg total) by mouth daily. 90 tablet 3  . Multiple Vitamins-Minerals (MULTIVITAMIN WITH MINERALS) tablet Take 1 tablet by mouth daily.    Marland Kitchen zolpidem (AMBIEN) 10 MG tablet Take 1 tablet (10 mg total) by mouth at bedtime as needed. 30 tablet 4   No current facility-administered  medications for this visit.    Allergies  Allergen Reactions  . Codeine Anaphylaxis  . Ace Inhibitors   . Iodinated Diagnostic Agents Hives  . Penicillins Rash    Family History  Problem Relation Age of Onset  . Colon polyps Father     age 81    History   Social History  . Marital Status: Single    Spouse Name: N/A  . Number of Children: N/A  . Years of Education: N/A   Occupational History  . Not on file.   Social History Main Topics  . Smoking status: Never Smoker   . Smokeless tobacco: Never Used  . Alcohol Use: 0.0 oz/week    0 Standard drinks or equivalent per week     Comment: wine  . Drug Use: No  . Sexual Activity: Not on file   Other Topics Concern  . Not on file   Social History Narrative     Constitutional: Denies fever, malaise, fatigue, headache or abrupt weight changes.  Respiratory: Denies difficulty breathing, shortness of breath, cough or sputum production.   Cardiovascular: Denies chest pain, chest tightness, palpitations or swelling in the hands or feet.  Gastrointestinal: Denies abdominal pain, bloating, constipation, diarrhea or blood in the stool.  GU: Denies urgency, frequency,  pain with urination, burning sensation, blood in urine, odor or discharge. Musculoskeletal: Pt reports low back pain Denies difficulty with gait, muscle pain or joint pain and swelling.  Neurological: Denies dizziness, difficulty with memory, difficulty with speech or problems with balance and coordination.   No other specific complaints in a complete review of systems (except as listed in HPI above).  Objective:   Physical Exam   BP 122/86 mmHg  Pulse 96  Temp(Src) 97.9 F (36.6 C) (Oral)  Wt 150 lb (68.04 kg)  SpO2 98% Wt Readings from Last 3 Encounters:  06/09/14 150 lb (68.04 kg)  05/26/14 146 lb (66.225 kg)  05/07/14 151 lb 4 oz (68.607 kg)    General: Appears her stated age, well developed, well nourished in NAD. Cardiovascular: Normal rate and  rhythm. S1,S2 noted.  No murmur, rubs or gallops noted.  Pulmonary/Chest: Normal effort and positive vesicular breath sounds. No respiratory distress. No wheezes, rales or ronchi noted.  Musculoskeletal: Normal extension of the spine. Decreased flexion and rotation secondary to pain. No pain with palpation of the thoracic and lumbar spine. Some pain with palpation of the left paralumbar muscles. Strength 5/5 RLE, 4/5 LLE. No difficulty with gait.  Neurological: Alert and oriented. Sensation intact to BLE.   BMET    Component Value Date/Time   NA 138 05/07/2014 0917   NA 136 04/27/2014 1112   K 4.7 05/07/2014 0917   K 4.0 04/27/2014 1112   CL 100 05/07/2014 0917   CL 101 04/27/2014 1112   CO2 32 05/07/2014 0917   CO2 25 04/27/2014 1112   GLUCOSE 88 05/07/2014 0917   GLUCOSE 124* 04/27/2014 1112   BUN 16 05/07/2014 0917   BUN 23* 04/27/2014 1112   CREATININE 0.70 05/07/2014 0917   CREATININE 0.74 04/27/2014 1112   CALCIUM 9.7 05/07/2014 0917   CALCIUM 8.5* 04/27/2014 1112   GFRNONAA >60 04/27/2014 1112   GFRAA >60 04/27/2014 1112    Lipid Panel     Component Value Date/Time   CHOL 172 05/07/2014 0917   TRIG 379.0* 05/07/2014 0917   HDL 39.20 05/07/2014 0917   CHOLHDL 4 05/07/2014 0917   VLDL 75.8* 05/07/2014 0917   LDLCALC 93 02/04/2014 0945    CBC    Component Value Date/Time   WBC 8.8 05/07/2014 0917   WBC 6.2 04/27/2014 1112   RBC 4.24 05/07/2014 0917   RBC 4.37 04/27/2014 1112   HGB 12.4 05/07/2014 0917   HGB 12.9 04/27/2014 1112   HCT 37.1 05/07/2014 0917   HCT 38.9 04/27/2014 1112   PLT 436.0* 05/07/2014 0917   PLT 273 04/27/2014 1112   MCV 87.6 05/07/2014 0917   MCV 89 04/27/2014 1112   MCH 29.5 04/27/2014 1112   MCHC 33.5 05/07/2014 0917   MCHC 33.1 04/27/2014 1112   RDW 14.1 05/07/2014 0917   RDW 14.0 04/27/2014 1112   LYMPHSABS 2.4 05/07/2014 0917   LYMPHSABS 0.4* 04/27/2014 1112   MONOABS 0.7 05/07/2014 0917   MONOABS 0.3 04/27/2014 1112    EOSABS 0.1 05/07/2014 0917   EOSABS 0.0 04/27/2014 1112   BASOSABS 0.0 05/07/2014 0917   BASOSABS 0.0 04/27/2014 1112    Hgb A1C Lab Results  Component Value Date   HGBA1C 5.7 05/07/2014        Assessment & Plan:   Low back pain with left side sciatica:  80 mg Depo IM today Advised her to take Aleve Q12H x 1 week Stretching exercises given  If pain persists or  worsens, she will follow up with me, will obtain xray of lumbar spine

## 2014-06-09 NOTE — Progress Notes (Signed)
Pre visit review using our clinic review tool, if applicable. No additional management support is needed unless otherwise documented below in the visit note. 

## 2014-06-09 NOTE — Addendum Note (Signed)
Addended by: Lurlean Nanny on: 06/09/2014 11:36 AM   Modules accepted: Orders

## 2014-06-24 ENCOUNTER — Other Ambulatory Visit: Payer: BLUE CROSS/BLUE SHIELD

## 2014-07-17 ENCOUNTER — Encounter: Payer: Self-pay | Admitting: Family Medicine

## 2014-07-17 ENCOUNTER — Ambulatory Visit (INDEPENDENT_AMBULATORY_CARE_PROVIDER_SITE_OTHER): Payer: BLUE CROSS/BLUE SHIELD | Admitting: Family Medicine

## 2014-07-17 VITALS — BP 108/70 | HR 88 | Temp 97.6°F | Wt 148.0 lb

## 2014-07-17 DIAGNOSIS — G5702 Lesion of sciatic nerve, left lower limb: Secondary | ICD-10-CM

## 2014-07-17 MED ORDER — PREDNISONE 20 MG PO TABS: ORAL_TABLET | ORAL | Status: DC

## 2014-07-17 NOTE — Progress Notes (Signed)
BP 108/70 mmHg  Pulse 88  Temp(Src) 97.6 F (36.4 C) (Oral)  Wt 148 lb (67.132 kg)  SpO2 96%   CC: back pain, leg pain  Subjective:    Patient ID: Christie Ramirez, female    DOB: 1955-03-19, 59 y.o.   MRN: [id number]  HPI: Christie Ramirez is a 60 y.o. female presenting on 07/17/2014 for Back Pain and Leg Pain   Seen here 06/09/2014 with LBP with L sciatica, treated with depo medrol 80mg  IM x1, and aleve BID x 1 wk and gentle stretches.   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 Woke up yesterday at 3am - with severe left buttock pain with radiation down posterior and anterior thigh to knee. No tingling/numbness, bowel/bladder accidents, fevers. Mild saddle anesthesia.   Currently not as severe as sxs in May.  Has tried aleve without improvement.  She has been regular with her exercises.   Pt thinks she has started having this pain since sitting with poor posture lopsided on left buttock at computer at [place] job over last 8 months.  Relevant past medical, surgical, family and social history reviewed and updated as indicated. Interim medical history since our last visit reviewed. Allergies and medications reviewed and updated. Current Outpatient Prescriptions on File Prior to Visit  Medication Sig  . amLODipine (NORVASC) 5 MG tablet Take 1 tablet (5 mg total) by mouth daily.  Marland Kitchen aspirin 81 MG tablet Take 81 mg by mouth daily.  Marland Kitchen atorvastatin (LIPITOR) 40 MG tablet Take 1 tablet (40 mg total) by mouth daily.  . clonazePAM (KLONOPIN) 1 MG tablet TAKE 1 TABLET BY MOUTH 3 TIMES A DAY AS NEEDED  . escitalopram (LEXAPRO) 20 MG tablet Take 1 tablet (20 mg total) by mouth daily.  Marland Kitchen letrozole (FEMARA) 2.5 MG tablet Take 2.5 mg by mouth daily.   Marland Kitchen levothyroxine (SYNTHROID, LEVOTHROID) 125 MCG tablet Take 1 tablet (125 mcg total) by mouth daily.  Marland Kitchen losartan (COZAAR) 100 MG tablet Take 1 tablet (100 mg total) by mouth daily.  . Multiple  Vitamins-Minerals (MULTIVITAMIN WITH MINERALS) tablet Take 1 tablet by mouth daily.  Marland Kitchen zolpidem (AMBIEN) 10 MG tablet Take 1 tablet (10 mg total) by mouth at bedtime as needed.   No current facility-administered medications on file prior to visit.    Review of Systems Per HPI unless specifically indicated above     Objective:    BP 108/70 mmHg  Pulse 88  Temp(Src) 97.6 F (36.4 C) (Oral)  Wt 148 lb (67.132 kg)  SpO2 96%  Wt Readings from Last 3 Encounters:  07/17/14 148 lb (67.132 kg)  06/09/14 150 lb (68.04 kg)  05/26/14 146 lb (66.225 kg)    Physical Exam  Constitutional: She appears well-developed and well-nourished. No distress.  Musculoskeletal: She exhibits no edema.  No pain midline spine No paraspinous mm tenderness Mildly + SLR on left No pain with int/ext rotation at hip. + FABER on left. Mild pain at L SIJ, ++ pain at L sciatic notch. No pain at [place] bilaterally.  Neurological: She has normal strength. No sensory deficit.  5/5 BLE strength  Skin: Skin is warm and dry. No rash noted.  Nursing note and vitals reviewed.       Assessment & Plan:   Problem List Items Addressed This Visit    Piriformis syndrome of left side - Primary    Discussed with patient. Anticipate pinching of sciatic nerve as it runs through piriformis muscle.  Treat with oral prednisone taper x 8 days as well as prn tylenol. Advised not to take aleve with prednisone. Provided with exercises from [facility] pt advisor. Discussed ergonomics at work. Update if not improving with treatment plan. Pt agrees.          Follow up plan: Return if symptoms worsen or fail to improve.

## 2014-07-17 NOTE — Patient Instructions (Addendum)
I do think you have piriformis syndrome. Treat with prednisone taper for next week and then once pain is improving may use exercises provided today. Ok to take tylenol while you're on steroids. Once done, ok to restart aleve. Let us know if not improving as expected or worsening symptoms.

## 2014-07-17 NOTE — Progress Notes (Signed)
Pre visit review using our clinic review tool, if applicable. No additional management support is needed unless otherwise documented below in the visit note. 

## 2014-07-17 NOTE — Assessment & Plan Note (Signed)
Discussed with patient. Anticipate pinching of sciatic nerve as it runs through piriformis muscle. Treat with oral prednisone taper x 8 days as well as prn tylenol. Advised not to take aleve with prednisone. Provided with exercises from [facility] pt advisor. Discussed ergonomics at work. Update if not improving with treatment plan. Pt agrees.

## 2014-07-29 ENCOUNTER — Ambulatory Visit: Payer: Self-pay | Admitting: Obstetrics and Gynecology

## 2014-08-06 ENCOUNTER — Ambulatory Visit: Payer: Self-pay | Admitting: Obstetrics and Gynecology

## 2014-09-10 ENCOUNTER — Ambulatory Visit: Payer: Self-pay | Admitting: Obstetrics and Gynecology

## 2014-09-23 ENCOUNTER — Encounter: Payer: Self-pay | Admitting: Obstetrics and Gynecology

## 2014-09-23 ENCOUNTER — Ambulatory Visit (INDEPENDENT_AMBULATORY_CARE_PROVIDER_SITE_OTHER): Payer: Self-pay | Admitting: Obstetrics and Gynecology

## 2014-09-23 VITALS — BP 116/81 | HR 91 | Ht 61.0 in | Wt 149.8 lb

## 2014-09-23 DIAGNOSIS — B009 Herpesviral infection, unspecified: Secondary | ICD-10-CM

## 2014-09-23 DIAGNOSIS — N809 Endometriosis, unspecified: Secondary | ICD-10-CM | POA: Insufficient documentation

## 2014-09-23 DIAGNOSIS — Z8619 Personal history of other infectious and parasitic diseases: Secondary | ICD-10-CM

## 2014-09-23 NOTE — Progress Notes (Signed)
Patient ID: Christie Ramirez, female   DOB: [birth year], 59 y.o.   MRN: [id number] Consult to discuss hsv Pos hsv no out break since 1994 Pt wants to know if she can transmit hsv to her partner?  Chief complaint: 1. History of HSV infection  The patient presents today for discussion regarding past history of HSV infection. In the early 1990s patient had contracted a clinical infection consistent with HSV 2, and subsequently had one other outbreak. She has not had any other action since that time. Multiple questions regarding the pathophysiology of HSV-2 were addressed. Clinical implications likewise were addressed.  IMPRESSION: 1. 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  PLAN: 1. IgG/IgM antibody titers for HSV-1 and HSV-2 are drawn 2. Role of acyclovir in suppressing potential HSV outbreaks was addressed and offered. Patient declines medication at this time. 3. Follow-up when necessary  A total of 15 minutes were spent face-to-face with the patient during this encounter and over half of that time dealt with counseling and coordination of care.  Brayton Mars, MD

## 2014-09-23 NOTE — Patient Instructions (Signed)
1. Pathophysiology of HSV was reviewed. 2. Patient understands the clinical implications of chronic long-term infection. 3. Patient understands the role of acyclovir in suppressing future outbreaks of patients who have positive HSV 2 history. 4. Patient does not desire acyclovir prescription at this time.

## 2014-09-25 LAB — HSV(HERPES SMPLX)ABS-I+II(IGG+IGM)-BLD

## 2014-09-30 ENCOUNTER — Telehealth: Payer: Self-pay

## 2014-09-30 NOTE — Telephone Encounter (Signed)
-----   Message from Brayton Mars, MD sent at 09/25/2014  9:00 AM EDT ----- Please Notify - Labs normal IGM test is negative. BUT we are still waiting for IGG test to determine past exposure. Do not contact pt until IGG result is available.

## 2014-10-01 NOTE — Telephone Encounter (Signed)
Pt aware or result- see result note.

## 2014-10-19 ENCOUNTER — Ambulatory Visit (INDEPENDENT_AMBULATORY_CARE_PROVIDER_SITE_OTHER): Payer: BLUE CROSS/BLUE SHIELD | Admitting: Nurse Practitioner

## 2014-10-19 ENCOUNTER — Encounter: Payer: Self-pay | Admitting: Nurse Practitioner

## 2014-10-19 VITALS — BP 138/84 | HR 88 | Temp 98.1°F | Resp 16 | Ht 61.0 in | Wt 150.1 lb

## 2014-10-19 DIAGNOSIS — J069 Acute upper respiratory infection, unspecified: Secondary | ICD-10-CM | POA: Diagnosis not present

## 2014-10-19 MED ORDER — AZITHROMYCIN 250 MG PO TABS: ORAL_TABLET | ORAL | Status: DC

## 2014-10-19 MED ORDER — PSEUDOEPH-BROMPHEN-DM 30-2-10 MG/5ML PO SYRP: 5.0000 mL | ORAL_SOLUTION | Freq: Four times a day (QID) | ORAL | Status: DC | PRN

## 2014-10-19 NOTE — Patient Instructions (Signed)
Please take a probiotic ( Align, [place] or Culturelle, yogurt) while you are on the antibiotic to prevent a serious antibiotic associated diarrhea  Called clostirudium dificile colitis and a vaginal yeast infection.   Take Z-pack as directed and cough syrup.

## 2014-10-19 NOTE — Progress Notes (Signed)
Pre visit review using our clinic review tool, if applicable. No additional management support is needed unless otherwise documented below in the visit note. 

## 2014-10-19 NOTE — Progress Notes (Signed)
Patient ID: Christie Ramirez, female    DOB: [birth year]  Age: 59 y.o. MRN: [id number]  CC: URI   HPI Christie Ramirez presents for CC of URI symptoms x 5 days.  1) Cough, yellow sputum, temperature of 101, congestion, PNDrip, and began with sore throat last Thursday.  Treatment to date: Oregano oil- orally   History Christie Ramirez has a past medical history of Hyperlipidemia; Hypothyroidism; Anxiety; Endometriosis (2009); Cancer (1995); Breast cancer of upper-inner quadrant of right female breast (05/01/2013); and Osteopenia.   She has past surgical history that includes colectomy (2009); Colonoscopy (2009); Foot surgery (right); Abdominal hysterectomy (2005); Breast biopsy (Right, 04-02-13); Breast surgery (Right, 05/01/13); and Colon surgery (December 2008).   Her family history includes Colon polyps in her father.She reports that she has never smoked. She has never used smokeless tobacco. She reports that she drinks alcohol. She reports that she does not use illicit drugs.  Outpatient Prescriptions Prior to Visit  Medication Sig Dispense Refill  . amLODipine (NORVASC) 5 MG tablet Take 1 tablet (5 mg total) by mouth daily. 90 tablet 3  . aspirin 81 MG tablet Take 81 mg by mouth daily.    Marland Kitchen atorvastatin (LIPITOR) 40 MG tablet Take 1 tablet (40 mg total) by mouth daily. 90 tablet 3  . clonazePAM (KLONOPIN) 1 MG tablet TAKE 1 TABLET BY MOUTH 3 TIMES A DAY AS NEEDED 90 tablet 3  . escitalopram (LEXAPRO) 20 MG tablet Take 1 tablet (20 mg total) by mouth daily. 90 tablet 3  . levothyroxine (SYNTHROID, LEVOTHROID) 125 MCG tablet Take 1 tablet (125 mcg total) by mouth daily. 90 tablet 3  . losartan (COZAAR) 100 MG tablet Take 1 tablet (100 mg total) by mouth daily. 90 tablet 3  . Multiple Vitamins-Minerals (MULTIVITAMIN WITH MINERALS) tablet Take 1 tablet by mouth daily.    Marland Kitchen zolpidem (AMBIEN) 10 MG tablet Take 1 tablet (10 mg total) by mouth at bedtime as needed. 30 tablet 4   No facility-administered  medications prior to visit.   ROS Review of Systems  Constitutional: Positive for fever, chills, diaphoresis and fatigue.  HENT: Positive for congestion, ear pain, postnasal drip, sneezing and sore throat. Negative for ear discharge, mouth sores, nosebleeds and sinus pressure.        Left ear  Eyes: Negative for pain, discharge, itching and visual disturbance.  Respiratory: Positive for cough. Negative for chest tightness, shortness of breath and wheezing.   Cardiovascular: Negative for chest pain, palpitations and leg swelling.  Gastrointestinal: Negative for nausea, vomiting and diarrhea.  Musculoskeletal: Negative for arthralgias.  Skin: Negative for rash.  Neurological: Positive for headaches. Negative for dizziness.    Objective:  BP 138/84 mmHg  Pulse 88  Temp(Src) 98.1 F (36.7 C)  Resp 16  Ht 5\' 1"  (1.549 m)  Wt 150 lb 1.9 oz (68.094 kg)  BMI 28.38 kg/m2  SpO2 96%  Physical Exam  Constitutional: She is oriented to person, place, and time. She appears well-developed and well-nourished. No distress.  HENT:  Head: Normocephalic and atraumatic.  Right Ear: External ear normal.  Left Ear: External ear normal.  Left TM injected and slightly bulging, canal erythematous Right TM visible landmarks with no retraction or bulging  Eyes: Right eye exhibits no discharge. Left eye exhibits no discharge. No scleral icterus.  Neck: Normal range of motion. Neck supple.  Cardiovascular: Normal rate.   Pulmonary/Chest: Effort normal and breath sounds normal. No respiratory distress. She has no wheezes. She has no rales. She exhibits  no tenderness.  Lymphadenopathy:    She has cervical adenopathy.  Neurological: She is alert and oriented to person, place, and time. No cranial nerve deficit. She exhibits normal muscle tone. Coordination normal.  Skin: Skin is warm. No rash noted. She is diaphoretic.  Psychiatric: She has a normal mood and affect. Her behavior is normal. Judgment and  thought content normal.   Assessment & Plan:   Christie Ramirez was seen today for uri.  Diagnoses and all orders for this visit:  Acute URI  Other orders -     brompheniramine-pseudoephedrine-DM 30-2-10 MG/5ML syrup; Take 5 mLs by mouth 4 (four) times daily as needed. -     azithromycin (ZITHROMAX) 250 MG tablet; Take 2 tablets by mouth on day 1, take 1 tablet by mouth each day after for 4 days.   I am having Ms. Orris start on brompheniramine-pseudoephedrine-DM and azithromycin. I am also having her maintain her multivitamin with minerals, aspirin, zolpidem, clonazePAM, losartan, levothyroxine, escitalopram, atorvastatin, and amLODipine.  Meds ordered this encounter  Medications  . brompheniramine-pseudoephedrine-DM 30-2-10 MG/5ML syrup    Sig: Take 5 mLs by mouth 4 (four) times daily as needed.    Dispense:  180 mL    Refill:  0    Order Specific Question:  Supervising Provider    Answer:  Deborra Medina L [[id number]]  . azithromycin (ZITHROMAX) 250 MG tablet    Sig: Take 2 tablets by mouth on day 1, take 1 tablet by mouth each day after for 4 days.    Dispense:  6 each    Refill:  0    Order Specific Question:  Supervising Provider    Answer:  Crecencio Mc [[id number]]     Follow-up: Return if symptoms worsen or fail to improve.

## 2014-10-19 NOTE — Assessment & Plan Note (Signed)
5 days of worsening symptoms and reported fever of 101 on Saturday. Bromfed syrup sent to pharmacy as well as z-pack. Encouraged probiotics. FU prn worsening/failure to improve.

## 2014-10-21 ENCOUNTER — Telehealth: Payer: Self-pay | Admitting: Internal Medicine

## 2014-10-21 NOTE — Telephone Encounter (Signed)
Pt called about a UTI since yesterday with burning and cloudy. Pharmacy is [facility] on [address]. Pt would like to come and just urine sample if possible. Thank You!

## 2014-10-22 ENCOUNTER — Encounter: Payer: Self-pay | Admitting: Family Medicine

## 2014-10-22 ENCOUNTER — Ambulatory Visit (INDEPENDENT_AMBULATORY_CARE_PROVIDER_SITE_OTHER): Payer: BLUE CROSS/BLUE SHIELD | Admitting: Family Medicine

## 2014-10-22 ENCOUNTER — Other Ambulatory Visit (HOSPITAL_COMMUNITY)
Admission: RE | Admit: 2014-10-22 | Discharge: 2014-10-22 | Disposition: A | Discharge status: Home / Self Care | Payer: BLUE CROSS/BLUE SHIELD | Source: Ambulatory Visit | Attending: Family Medicine | Admitting: Family Medicine

## 2014-10-22 VITALS — BP 124/84 | HR 86 | Temp 97.7°F | Ht 61.0 in | Wt 152.8 lb

## 2014-10-22 DIAGNOSIS — N390 Urinary tract infection, site not specified: Secondary | ICD-10-CM | POA: Insufficient documentation

## 2014-10-22 DIAGNOSIS — N898 Other specified noninflammatory disorders of vagina: Secondary | ICD-10-CM | POA: Diagnosis not present

## 2014-10-22 DIAGNOSIS — Z113 Encounter for screening for infections with a predominantly sexual mode of transmission: Secondary | ICD-10-CM | POA: Diagnosis not present

## 2014-10-22 DIAGNOSIS — B379 Candidiasis, unspecified: Secondary | ICD-10-CM | POA: Diagnosis not present

## 2014-10-22 DIAGNOSIS — R300 Dysuria: Secondary | ICD-10-CM

## 2014-10-22 DIAGNOSIS — N3000 Acute cystitis without hematuria: Secondary | ICD-10-CM | POA: Diagnosis not present

## 2014-10-22 LAB — URINALYSIS, MICROSCOPIC ONLY

## 2014-10-22 LAB — POCT URINALYSIS DIPSTICK
BILIRUBIN UA: NEGATIVE
GLUCOSE UA: NEGATIVE
KETONES UA: NEGATIVE
Nitrite, UA: NEGATIVE
Protein, UA: NEGATIVE
UROBILINOGEN UA: 0.2
pH, UA: 5.5

## 2014-10-22 MED ORDER — NITROFURANTOIN MONOHYD MACRO 100 MG PO CAPS: 100.0000 mg | ORAL_CAPSULE | Freq: Two times a day (BID) | ORAL | Status: DC

## 2014-10-22 MED ORDER — FLUCONAZOLE 150 MG PO TABS: 150.0000 mg | ORAL_TABLET | ORAL | Status: DC

## 2014-10-22 NOTE — Progress Notes (Signed)
Patient ID: Christie Ramirez, female   DOB: 03/31/1955, 59 y.o.   MRN: [id number]  [name], MD Phone: [phone]  Christie Ramirez is a 59 y.o. female who presents today for same day appointment.  Dysuria: started yesterday with burning with urination. Has frequency and urgency. No fevers. Some vaginal discharge that looks like yogurt. When has urgency has pressure sensation in suprapubic area. No abdominal pain. No vomiting or diarrhea. Has itching with discharge. Notes sexually active with no penetration 2 weeks ago. No history of STDs. No fevers. Mild right lower back pain with this. No numbness, weakness, saddle anesthesia, or incontinence. Has history of breast cancer. Did OTC monostat.  PMH: nonsmoker.    ROS see HPI  Objective  Physical Exam Filed Vitals:   10/22/14 0944  BP: 124/84  Pulse: 86  Temp: 97.7 F (36.5 C)    Physical Exam  Constitutional: She is well-developed, well-nourished, and in no distress.  HENT:  Head: Normocephalic and atraumatic.  Right Ear: External ear normal.  Left Ear: External ear normal.  Mouth/Throat: Oropharynx is clear and moist. No oropharyngeal exudate.  Normal TM bilaterally  Eyes: Conjunctivae are normal. Pupils are equal, round, and reactive to light.  Neck: Neck supple.  Cardiovascular: Normal rate and regular rhythm.  Exam reveals no gallop and no friction rub.   No murmur heard. Pulmonary/Chest: Effort normal and breath sounds normal. No respiratory distress. She has no wheezes. She has no rales.  Abdominal: Soft. Bowel sounds are normal. She exhibits no distension. There is no tenderness. There is no rebound and no guarding.  Genitourinary:  Cervix absent, thick white discharge noted, no bleeding or erythema, no adnexal tenderness or masses appreciated  Musculoskeletal: She exhibits no edema.  No midline mack TTP, no back tenderness or swelling, no CVA tenderness   Lymphadenopathy:    She has no cervical adenopathy.    Neurological: She is alert. Gait normal.  5/5 strength in bilateral quads, hamstrings, plantar and dorsiflexion, sensation to light touch intact in bilateral LE, normal gait, 2+ patellar reflexes  Skin: Skin is warm and dry. She is not diaphoretic.     Assessment/Plan: Please see individual problem list.  Dysuria Symptoms and UA consistent with UTI. Also with vaginal discharge with appearance consistent with yeast infection. Benign abdominal exam. Has had uterus, ovaries, and appendix removed. Well appearing. See UTI and yeast infection problems for plan. Given return precautions.   Yeast infection Discharge appears consistent with yeast. Will send wet prep and GC/chlamydia to evaluate. Benign abdominal and pelvic exams. Vitals stable. Given return precautions.   UTI (urinary tract infection) UA and symptoms consistent with UTI. Will treat with macrobid given penicillin allergy. Benign exam. UCx and micro sent. Given return precautions.     Orders Placed This Encounter  Procedures  . Urine Culture  . WET PREP FOR [place], YEAST, CLUE  . Urine Microscopic Only  . POCT Urinalysis Dipstick    Meds ordered this encounter  Medications  . nitrofurantoin, macrocrystal-monohydrate, (MACROBID) 100 MG capsule    Sig: Take 1 capsule (100 mg total) by mouth 2 (two) times daily.    Dispense:  14 capsule    Refill:  0  . fluconazole (DIFLUCAN) 150 MG tablet    Sig: Take 1 tablet (150 mg total) by mouth every 3 (three) days.    Dispense:  2 tablet    Refill:  0   [name]

## 2014-10-22 NOTE — Telephone Encounter (Signed)
Ok. Appt was scheduled. Thank You! Have a great day.

## 2014-10-22 NOTE — Progress Notes (Signed)
Pre visit review using our clinic review tool, if applicable. No additional management support is needed unless otherwise documented below in the visit note. 

## 2014-10-22 NOTE — Telephone Encounter (Signed)
We do not allow pt's to do any "drop offs"   Please call pt and schedule an appt.

## 2014-10-22 NOTE — Assessment & Plan Note (Signed)
UA and symptoms consistent with UTI. Will treat with macrobid given penicillin allergy. Benign exam. UCx and micro sent. Given return precautions.

## 2014-10-22 NOTE — Patient Instructions (Signed)
Nice to meet you. We will start you on treatment for a UTI and yeast infection. If you develop abdominal pain, nausea, vomiting, diarrhea, muscle aches, fevers, back pain, numbness, weakness, chills, or feel poorly please seek medical attention.

## 2014-10-22 NOTE — Assessment & Plan Note (Addendum)
Symptoms and UA consistent with UTI. Also with vaginal discharge with appearance consistent with yeast infection. Benign abdominal exam. Has had uterus, ovaries, and appendix removed. Well appearing. See UTI and yeast infection problems for plan. Given return precautions.

## 2014-10-22 NOTE — Assessment & Plan Note (Signed)
Discharge appears consistent with yeast. Will send wet prep and GC/chlamydia to evaluate. Benign abdominal and pelvic exams. Vitals stable. Given return precautions.

## 2014-10-23 ENCOUNTER — Other Ambulatory Visit: Payer: Self-pay | Admitting: *Deleted

## 2014-10-23 DIAGNOSIS — C50211 Malignant neoplasm of upper-inner quadrant of right female breast: Secondary | ICD-10-CM

## 2014-10-23 LAB — CERVICOVAGINAL ANCILLARY ONLY
Chlamydia: NEGATIVE
Neisseria Gonorrhea: NEGATIVE

## 2014-10-23 LAB — WET PREP BY MOLECULAR PROBE
CANDIDA SPECIES: NEGATIVE
Gardnerella vaginalis: NEGATIVE
Trichomonas vaginosis: POSITIVE — AB

## 2014-10-24 ENCOUNTER — Telehealth: Payer: Self-pay | Admitting: Family Medicine

## 2014-10-24 LAB — URINE CULTURE
COLONY COUNT: NO GROWTH
ORGANISM ID, BACTERIA: NO GROWTH

## 2014-10-24 NOTE — Telephone Encounter (Signed)
Attempted to call patient to discuss results. There was no answer and I left a message asking that she call back to the office. No medical information was left on her VM. Will await call back.   When she calls back, her urine culture was negative, so she can stop the macrobid. Her gonorrhea and chlamydia were negative. Her yeast and BV were negative. Her trichomonas was positive. This is a sexually transmitted infection. It will need to be treated with flagyl. She should be advised to not be sexually active until she has completed treatment. She should inform her partner of this infection and that they will need to be treated as well by their health care provider.

## 2014-10-26 ENCOUNTER — Inpatient Hospital Stay: Payer: BLUE CROSS/BLUE SHIELD | Admitting: Oncology

## 2014-10-26 ENCOUNTER — Inpatient Hospital Stay: Payer: BLUE CROSS/BLUE SHIELD | Attending: Oncology

## 2014-10-26 MED ORDER — METRONIDAZOLE 500 MG PO TABS: 500.0000 mg | ORAL_TABLET | Freq: Two times a day (BID) | ORAL | Status: DC

## 2014-10-26 NOTE — Telephone Encounter (Signed)
Called and spoke to the patient. Advised of positive trichomonas. Other testing was negative. Patient continues to have vaginal itching and discharge. No abdominal pain. No fevers. No dysuria, frequency, or urgency. Does note cloudy urine but no urinary complaints. Urine culture was negative so unlikely a UTI. Will send in flagyl. Will stop macrobid. Patient scheduled a follow-up for tomorrow, though will wait to see if she responds to the flagyl and if symptoms persist will schedule follow-up at that time. Given return precautions.

## 2014-10-27 ENCOUNTER — Ambulatory Visit: Payer: BLUE CROSS/BLUE SHIELD | Admitting: Family Medicine

## 2014-11-04 ENCOUNTER — Ambulatory Visit: Payer: BLUE CROSS/BLUE SHIELD | Admitting: General Surgery

## 2014-11-05 ENCOUNTER — Ambulatory Visit: Payer: BLUE CROSS/BLUE SHIELD | Admitting: Internal Medicine

## 2014-11-06 ENCOUNTER — Other Ambulatory Visit (HOSPITAL_COMMUNITY)
Admission: RE | Admit: 2014-11-06 | Discharge: 2014-11-06 | Disposition: A | Discharge status: Home / Self Care | Payer: BLUE CROSS/BLUE SHIELD | Source: Ambulatory Visit | Attending: Internal Medicine | Admitting: Internal Medicine

## 2014-11-06 ENCOUNTER — Encounter: Payer: Self-pay | Admitting: *Deleted

## 2014-11-06 ENCOUNTER — Ambulatory Visit (INDEPENDENT_AMBULATORY_CARE_PROVIDER_SITE_OTHER): Payer: BLUE CROSS/BLUE SHIELD | Admitting: Internal Medicine

## 2014-11-06 ENCOUNTER — Encounter: Payer: Self-pay | Admitting: Internal Medicine

## 2014-11-06 VITALS — BP 111/70 | HR 109 | Temp 97.4°F | Ht 61.0 in | Wt 150.0 lb

## 2014-11-06 DIAGNOSIS — A599 Trichomoniasis, unspecified: Secondary | ICD-10-CM | POA: Insufficient documentation

## 2014-11-06 DIAGNOSIS — E039 Hypothyroidism, unspecified: Secondary | ICD-10-CM

## 2014-11-06 DIAGNOSIS — I10 Essential (primary) hypertension: Secondary | ICD-10-CM | POA: Diagnosis not present

## 2014-11-06 DIAGNOSIS — F419 Anxiety disorder, unspecified: Secondary | ICD-10-CM

## 2014-11-06 LAB — COMPREHENSIVE METABOLIC PANEL
ALK PHOS: 77 U/L (ref 39–117)
ALT: 36 U/L — ABNORMAL HIGH (ref 0–35)
AST: 21 U/L (ref 0–37)
Albumin: 4.2 g/dL (ref 3.5–5.2)
BUN: 20 mg/dL (ref 6–23)
CHLORIDE: 102 meq/L (ref 96–112)
CO2: 27 mEq/L (ref 19–32)
Calcium: 9.2 mg/dL (ref 8.4–10.5)
Creatinine, Ser: 1.09 mg/dL (ref 0.40–1.20)
GFR: 54.76 mL/min — AB (ref >60.00)
GLUCOSE: 96 mg/dL (ref 70–99)
POTASSIUM: 4 meq/L (ref 3.5–5.1)
SODIUM: 138 meq/L (ref 135–145)
TOTAL PROTEIN: 7 g/dL (ref 6.0–8.3)
Total Bilirubin: 0.4 mg/dL (ref 0.2–1.2)

## 2014-11-06 LAB — CBC WITH DIFFERENTIAL/PLATELET
BASOS ABS: 0 10*3/uL (ref 0.0–0.1)
BASOS PCT: 0.7 % (ref 0.0–3.0)
Eosinophils Absolute: 0.1 10*3/uL (ref 0.0–0.7)
Eosinophils Relative: 1.4 % (ref 0.0–5.0)
HEMATOCRIT: 36.9 % (ref 36.0–46.0)
HEMOGLOBIN: 12.4 g/dL (ref 12.0–15.0)
LYMPHS PCT: 35.2 % (ref 12.0–46.0)
Lymphs Abs: 2.5 10*3/uL (ref 0.7–4.0)
MCHC: 33.8 g/dL (ref 30.0–36.0)
MCV: 88.9 fl (ref 78.0–100.0)
MONOS PCT: 8 % (ref 3.0–12.0)
Monocytes Absolute: 0.6 10*3/uL (ref 0.1–1.0)
NEUTROS ABS: 3.9 10*3/uL (ref 1.4–7.7)
Neutrophils Relative %: 54.7 % (ref 43.0–77.0)
PLATELETS: 351 10*3/uL (ref 150.0–400.0)
RBC: 4.14 Mil/uL (ref 3.87–5.11)
RDW: 13.6 % (ref 11.5–15.5)
WBC: 7.1 10*3/uL (ref 4.0–10.5)

## 2014-11-06 LAB — TSH: TSH: 4.26 u[IU]/mL (ref 0.35–4.50)

## 2014-11-06 MED ORDER — LOSARTAN POTASSIUM 100 MG PO TABS: 100.0000 mg | ORAL_TABLET | Freq: Every day | ORAL | Status: DC

## 2014-11-06 MED ORDER — AMLODIPINE BESYLATE 5 MG PO TABS: 5.0000 mg | ORAL_TABLET | Freq: Every day | ORAL | Status: DC

## 2014-11-06 MED ORDER — ATORVASTATIN CALCIUM 40 MG PO TABS: 40.0000 mg | ORAL_TABLET | Freq: Every day | ORAL | Status: DC

## 2014-11-06 MED ORDER — LEVOTHYROXINE SODIUM 125 MCG PO TABS: 125.0000 ug | ORAL_TABLET | Freq: Every day | ORAL | Status: DC

## 2014-11-06 MED ORDER — CLONAZEPAM 1 MG PO TABS: ORAL_TABLET | ORAL | Status: DC

## 2014-11-06 MED ORDER — ZOLPIDEM TARTRATE 10 MG PO TABS: 10.0000 mg | ORAL_TABLET | Freq: Every evening | ORAL | Status: DC | PRN

## 2014-11-06 MED ORDER — ESCITALOPRAM OXALATE 20 MG PO TABS: 20.0000 mg | ORAL_TABLET | Freq: Every day | ORAL | Status: DC

## 2014-11-06 NOTE — Assessment & Plan Note (Signed)
BP Readings from Last 3 Encounters:  11/06/14 111/70  10/22/14 124/84  10/19/14 138/84   BP well controlled. Renal function with labs.

## 2014-11-06 NOTE — Patient Instructions (Addendum)
Try decreasing Lexapro to 10mg  daily.   Repeat urine cytology today.

## 2014-11-06 NOTE — Assessment & Plan Note (Signed)
Symptoms well controlled. Will try cutting Lexapro to 10mg  daily. If tolerates well, will try stopping after 1 month or so.

## 2014-11-06 NOTE — Progress Notes (Signed)
Subjective:    Patient ID: Christie Ramirez, female    DOB: 1955-06-05, 59 y.o.   MRN: [id number]  HPI 59YO female presents for follow up.  Recently treated for trichomonas. Completed Flagyl. Continues to have some vaginal itching. Less discharge. No pelvic pain. No fever, chills.  Recently trying to improve weight. Following healthy diet. Exercising by walking. Has lost about 6-10 lbs at home.  Generally, feeling well. Compliant with medications.  Anxiety well controlled with Lexapro and prn Alprazolam. Would like to decrease dose of Lexapro.   Wt Readings from Last 3 Encounters:  11/06/14 150 lb (68.04 kg)  10/22/14 152 lb 12.8 oz (69.31 kg)  10/19/14 150 lb 1.9 oz (68.094 kg)   BP Readings from Last 3 Encounters:  11/06/14 111/70  10/22/14 124/84  10/19/14 138/84    Past Medical History  Diagnosis Date  . Hyperlipidemia   . Hypothyroidism   . Anxiety   . Endometriosis 2009  . Cancer ([place]) 1995    thyroid  . 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   . Osteopenia     rt hip   Family History  Problem Relation Age of Onset  . Colon polyps Father     age 60   Past Surgical History  Procedure Laterality Date  . Colectomy  2009    secondary to endometriosis DR. Smith  . Colonoscopy  2009    Dr. Tamala Julian  . Foot surgery  right  . Abdominal hysterectomy  2005    Martin DeFrancesco,MD  . Breast biopsy Right 04-02-13    INVASIVE MAMMARY CARCINOMA WITH FEATURES OF TUBULAR CARCINOMA,  . Breast surgery Right 05/01/13    wide excision  . 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 No malignancy.   Social History   Social History  . Marital Status: Single    Spouse Name: N/A  . Number of Children: N/A  . Years of Education: N/A   Social History Main Topics  . Smoking status: Never Smoker   . Smokeless  tobacco: Never Used  . Alcohol Use: 0.0 oz/week    0 Standard drinks or equivalent per week     Comment: wine  . Drug Use: No  . Sexual Activity: No   Other Topics Concern  . None   Social History Narrative    Review of Systems  Constitutional: Negative for fever, chills, appetite change, fatigue and unexpected weight change.  Eyes: Negative for visual disturbance.  Respiratory: Negative for cough and shortness of breath.   Cardiovascular: Negative for chest pain and leg swelling.  Gastrointestinal: Negative for nausea, vomiting, abdominal pain, diarrhea and constipation.  Genitourinary: Positive for vaginal discharge. Negative for dysuria, urgency, frequency, vaginal bleeding, vaginal pain and pelvic pain.  Musculoskeletal: Negative for myalgias and arthralgias.  Skin: Negative for color change and rash.  Hematological: Negative for adenopathy. Does not bruise/bleed easily.  Psychiatric/Behavioral: Negative for sleep disturbance and dysphoric mood. The patient is not nervous/anxious.        Objective:    BP 111/70 mmHg  Pulse 109  Temp(Src) 97.4 F (36.3 C) (Oral)  Ht 5\' 1"  (1.549 m)  Wt 150 lb (68.04 kg)  BMI 28.36 kg/m2  SpO2 94% Physical Exam  Constitutional: She is oriented to person, place, and time. She appears well-developed and well-nourished. No distress.  HENT:  Head: Normocephalic and atraumatic.  Right Ear: External ear normal.  Left Ear: External ear normal.  Nose: Nose normal.  Mouth/Throat: Oropharynx is clear and moist. No oropharyngeal exudate.  Eyes: Conjunctivae are normal. Pupils are equal, round, and reactive to light. Right eye exhibits no discharge. Left eye exhibits no discharge. No scleral icterus.  Neck: Normal range of motion. Neck supple. No tracheal deviation present. No thyromegaly present.  Cardiovascular: Normal rate, regular rhythm, normal heart sounds and intact distal pulses.  Exam reveals no gallop and no friction rub.   No murmur  heard. Pulmonary/Chest: Effort normal and breath sounds normal. No respiratory distress. She has no wheezes. She has no rales. She exhibits no tenderness.  Musculoskeletal: Normal range of motion. She exhibits no edema or tenderness.  Lymphadenopathy:    She has no cervical adenopathy.  Neurological: She is alert and oriented to person, place, and time. No cranial nerve deficit. She exhibits normal muscle tone. Coordination normal.  Skin: Skin is warm and dry. No rash noted. She is not diaphoretic. No erythema. No pallor.  Psychiatric: She has a normal mood and affect. Her behavior is normal. Judgment and thought content normal.          Assessment & Plan:   Problem List Items Addressed This Visit      Unprioritized   Anxiety    Symptoms well controlled. Will try cutting Lexapro to 10mg  daily. If tolerates well, will try stopping after 1 month or so.      Relevant Medications   escitalopram (LEXAPRO) 20 MG tablet   Hypertension - Primary    BP Readings from Last 3 Encounters:  11/06/14 111/70  10/22/14 124/84  10/19/14 138/84   BP well controlled. Renal function with labs.      Relevant Medications   losartan (COZAAR) 100 MG tablet   atorvastatin (LIPITOR) 40 MG tablet   amLODipine (NORVASC) 5 MG tablet   Other Relevant Orders   Comprehensive metabolic panel   Hypothyroidism    Will check TSH with labs. Continue Levothyroxine.      Relevant Medications   levothyroxine (SYNTHROID, LEVOTHROID) 125 MCG tablet   Other Relevant Orders   CBC with Differential/Platelet   TSH   Trichomonal infection    Symptoms improved but not resolved. Will check urine cytology for trichomonas. Follow up prn.      Relevant Orders   Urine cytology ancillary only       Return in about 3 months (around 02/06/2015).

## 2014-11-06 NOTE — Assessment & Plan Note (Signed)
Symptoms improved but not resolved. Will check urine cytology for trichomonas. Follow up prn.

## 2014-11-06 NOTE — Progress Notes (Signed)
Pre visit review using our clinic review tool, if applicable. No additional management support is needed unless otherwise documented below in the visit note. 

## 2014-11-06 NOTE — Assessment & Plan Note (Signed)
Will check TSH with labs. Continue Levothyroxine. 

## 2014-11-09 LAB — URINE CYTOLOGY ANCILLARY ONLY: Trichomonas: NEGATIVE

## 2014-11-11 ENCOUNTER — Encounter: Payer: Self-pay | Admitting: General Surgery

## 2014-11-23 ENCOUNTER — Encounter: Payer: Self-pay | Admitting: General Surgery

## 2014-11-23 ENCOUNTER — Ambulatory Visit (INDEPENDENT_AMBULATORY_CARE_PROVIDER_SITE_OTHER): Payer: BLUE CROSS/BLUE SHIELD | Admitting: General Surgery

## 2014-11-23 VITALS — BP 142/76 | HR 80 | Resp 13 | Ht 62.0 in | Wt 149.0 lb

## 2014-11-23 DIAGNOSIS — C50211 Malignant neoplasm of upper-inner quadrant of right female breast: Secondary | ICD-10-CM

## 2014-11-23 NOTE — Progress Notes (Signed)
Patient ID: Christie Ramirez, female   DOB: [birth year], 59 y.o.   MRN: [id number]  Chief Complaint  Patient presents with  . Follow-up    mammogram    HPI Christie Ramirez is a 59 y.o. female who presents for a breast evaluation. The most recent left breast mammogram was done on 11/13/14.  Patient does perform regular self breast checks and gets regular mammograms done.    HPI  Past Medical History  Diagnosis Date  . Hyperlipidemia   . Hypothyroidism   . Anxiety   . Endometriosis 2009  . Cancer ([place]) 1995    thyroid  . 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   . Osteopenia     rt hip    Past Surgical History  Procedure Laterality Date  . Colectomy  2009    secondary to endometriosis DR. Smith  . Colonoscopy  2009    Dr. Tamala Julian  . Foot surgery  right  . Abdominal hysterectomy  2005    Martin DeFrancesco,MD  . Breast biopsy Right 04-02-13    INVASIVE MAMMARY CARCINOMA WITH FEATURES OF TUBULAR CARCINOMA,  . Breast surgery Right 05/01/13    wide excision  . 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 No malignancy.    Family History  Problem Relation Age of Onset  . Colon polyps Father     age 35    Social History Social History  Substance Use Topics  . Smoking status: Never Smoker   . Smokeless tobacco: Never Used  . Alcohol Use: 0.0 oz/week    0 Standard drinks or equivalent per week     Comment: wine    Allergies  Allergen Reactions  . Codeine Anaphylaxis  . Ace Inhibitors   . Iodinated Diagnostic Agents Hives  . Penicillins Rash    Current Outpatient Prescriptions  Medication Sig Dispense Refill  . amLODipine (NORVASC) 5 MG tablet Take 1 tablet (5 mg total) by mouth daily. 90 tablet 3  . aspirin 81 MG tablet Take 81 mg by mouth daily.    Marland Kitchen atorvastatin (LIPITOR) 40 MG tablet Take 1 tablet  (40 mg total) by mouth daily. 90 tablet 3  . clonazePAM (KLONOPIN) 1 MG tablet TAKE 1 TABLET BY MOUTH 3 TIMES A DAY AS NEEDED 90 tablet 3  . escitalopram (LEXAPRO) 20 MG tablet Take 1 tablet (20 mg total) by mouth daily. 90 tablet 3  . letrozole (FEMARA) 2.5 MG tablet Take 2.5 mg by mouth daily.    Marland Kitchen levothyroxine (SYNTHROID, LEVOTHROID) 125 MCG tablet Take 1 tablet (125 mcg total) by mouth daily. 90 tablet 3  . losartan (COZAAR) 100 MG tablet Take 1 tablet (100 mg total) by mouth daily. 90 tablet 3  . Multiple Vitamins-Minerals (MULTIVITAMIN WITH MINERALS) tablet Take 1 tablet by mouth daily.    Marland Kitchen zolpidem (AMBIEN) 10 MG tablet Take 1 tablet (10 mg total) by mouth at bedtime as needed. 30 tablet 4   No current facility-administered medications for this visit.    Review of Systems Review of Systems  Constitutional: Negative.   Respiratory: Negative.   Cardiovascular: Negative.     Blood pressure 142/76, pulse 80, resp. rate 13, height 5\' 2"  (1.575 m), weight 149 lb (67.586 kg).  Physical Exam Physical Exam  Constitutional: She is oriented to person, place, and time. She appears well-developed and  well-nourished.  Eyes: Conjunctivae are normal. No scleral icterus.  Neck: Neck supple.  Cardiovascular: Normal rate, regular rhythm and normal heart sounds.   Pulmonary/Chest: Effort normal and breath sounds normal. Right breast exhibits no inverted nipple, no mass, no nipple discharge, no skin change and no tenderness. Left breast exhibits no inverted nipple, no mass, no nipple discharge, no skin change and no tenderness.    Right breast well healed incision from 12 to 2.   Lymphadenopathy:    She has no cervical adenopathy.    She has no axillary adenopathy.  Neurological: She is alert and oriented to person, place, and time.  Skin: Skin is warm and dry.    Data Reviewed Left breast diagnostic mammograms dated 11/10/2014 were reviewed. Calcifications previously identified are  unchanged. BI-RADS-2.   Assessment     Benign breast exam. No evidence of recurrent malignancy.  Continued tolerance of Femara.    Plan      The patient has been asked to return to the office in six months with a bilateral diagnostic mammogram. PCP:  Nadene Rubins 11/23/2014, 3:00 PM

## 2014-11-24 ENCOUNTER — Inpatient Hospital Stay: Payer: BLUE CROSS/BLUE SHIELD | Admitting: Oncology

## 2014-11-24 ENCOUNTER — Inpatient Hospital Stay: Payer: BLUE CROSS/BLUE SHIELD

## 2014-12-16 ENCOUNTER — Encounter: Payer: Self-pay | Admitting: Family Medicine

## 2014-12-16 ENCOUNTER — Ambulatory Visit (INDEPENDENT_AMBULATORY_CARE_PROVIDER_SITE_OTHER): Payer: BLUE CROSS/BLUE SHIELD | Admitting: Family Medicine

## 2014-12-16 VITALS — BP 124/82 | HR 93 | Temp 97.7°F | Ht 62.0 in | Wt 148.8 lb

## 2014-12-16 DIAGNOSIS — S46812A Strain of other muscles, fascia and tendons at shoulder and upper arm level, left arm, initial encounter: Secondary | ICD-10-CM

## 2014-12-16 DIAGNOSIS — S46819A Strain of other muscles, fascia and tendons at shoulder and upper arm level, unspecified arm, initial encounter: Secondary | ICD-10-CM | POA: Insufficient documentation

## 2014-12-16 MED ORDER — CYCLOBENZAPRINE HCL 10 MG PO TABS
10.0000 mg | ORAL_TABLET | Freq: Three times a day (TID) | ORAL | Status: DC | PRN
Start: 2014-12-16 — End: 2015-01-29

## 2014-12-16 NOTE — Patient Instructions (Signed)
Nice to see you. You have strained a muscle in your neck. You should treat this with heat, ibuprofen 800 mg by mouth every 8 hours as needed for discomfort, and Flexeril which is a muscle relaxer. He should not take this muscle relaxer with her Ambien. This medicine can make you drowsy so he should not take it and drive. If you develop worsening pain, headaches, numbness, weakness, tingling, vision changes, or any new or change in symptoms please seek medical attention immediately.

## 2014-12-16 NOTE — Progress Notes (Signed)
Patient ID: Christie Ramirez, female   DOB: [birth year], 59 y.o.   MRN: [id number]  [name], MD Phone: [phone]  Christie Ramirez is a 59 y.o. female who presents today for same day visit.  Left-sided neck pain: Patient notes for the last week she has had gradual onset in left mid trapezius discomfort. She notes no injury to this area. She noted prior to today she had stiffness in that side that limited her range of motion. She has used a heating pad for the last 24 hours and this loosened it up. She has full range of motion now with her neck. She does note when she rotates her neck she has: Sensation in her left mid trapezius. She did have minimal tingling in her left hand several nights ago that resolved with change in position. She's not had any recurrent tingling. She denied any numbness or weakness. She denies any chest pain or shoulder pain. She does not have any shortness of breath. Been taking Aleve 440 mg twice daily for this.  She additionally notes she had swelling in the area of her right parotid gland last week and had some discomfort with this, though this improved over several days quickly. The swelling improved. She saw her dentist and they said there is nothing abnormal. She feels fine at this time.  PMH: nonsmoker.   ROSsee history of present illness  Objective  Physical Exam Filed Vitals:   12/16/14 1410  BP: 124/82  Pulse: 93  Temp: 97.7 F (36.5 C)   Physical Exam  Constitutional: She is well-developed, well-nourished, and in no distress.  HENT:  Head: Normocephalic and atraumatic.  Eyes: Conjunctivae are normal. Pupils are equal, round, and reactive to light.  Neck: Normal range of motion. Neck supple.  Cardiovascular: Normal rate, regular rhythm and normal heart sounds.  Exam reveals no gallop and no friction rub.   No murmur heard. 2+ radial pulses, 2+ carotid pulses  Pulmonary/Chest: Effort normal and breath sounds normal. No respiratory distress.  She has no wheezes. She has no rales.  Musculoskeletal:  No midline neck tenderness, there is mild tenderness and spasm in the left mid trapezius, full range of motion of the neck, negative Spurling's bilaterally  Neurological: She is alert.  CN 2-12 intact, 5/5 strength in bilateral biceps, triceps, grip, quads, hamstrings, plantar and dorsiflexion, sensation to light touch intact in bilateral UE and LE, normal gait, 2+ patellar reflexes  Skin: Skin is warm and dry. She is not diaphoretic.     Assessment/Plan: Please see individual problem list.  Trapezius strain History and exam consistent with strain and spasm of the left mid trapezius. She is neurologically intact. Suspect tingling that she had was positional while she was asleep. Negative Spurling's. Doubt nerve root compression in the neck at this time. We will treat her with Flexeril, ibuprofen 800 mg every 8 hours as needed in place of Aleve, and heat. She is not to take the Flexeril with her Ambien. She is given return precautions. If this persists or if she develops any new symptoms she'll seek medical attention.   suspect swelling in the parotid area was related to sialadenitis given quick resolution. She has benign exam today. She will continue to monitor for recurs she will be seen.   Meds ordered this encounter  Medications  . cyclobenzaprine (FLEXERIL) 10 MG tablet    Sig: Take 1 tablet (10 mg total) by mouth 3 (three) times daily as needed for muscle spasms.    Dispense:  30 tablet    Refill:  0    [name]

## 2014-12-16 NOTE — Progress Notes (Signed)
Pre visit review using our clinic review tool, if applicable. No additional management support is needed unless otherwise documented below in the visit note. 

## 2014-12-16 NOTE — Assessment & Plan Note (Signed)
History and exam consistent with strain and spasm of the left mid trapezius. She is neurologically intact. Suspect tingling that she had was positional while she was asleep. Negative Spurling's. Doubt nerve root compression in the neck at this time. We will treat her with Flexeril, ibuprofen 800 mg every 8 hours as needed in place of Aleve, and heat. She is not to take the Flexeril with her Ambien. She is given return precautions. If this persists or if she develops any new symptoms she'll seek medical attention.

## 2014-12-22 ENCOUNTER — Encounter: Payer: Self-pay | Admitting: *Deleted

## 2014-12-22 ENCOUNTER — Inpatient Hospital Stay: Payer: BLUE CROSS/BLUE SHIELD | Admitting: Oncology

## 2014-12-22 ENCOUNTER — Inpatient Hospital Stay: Payer: BLUE CROSS/BLUE SHIELD | Attending: Oncology

## 2015-01-02 ENCOUNTER — Other Ambulatory Visit: Payer: Self-pay | Admitting: Internal Medicine

## 2015-01-04 ENCOUNTER — Ambulatory Visit (INDEPENDENT_AMBULATORY_CARE_PROVIDER_SITE_OTHER)
Admission: RE | Admit: 2015-01-04 | Discharge: 2015-01-04 | Disposition: A | Discharge status: Home / Self Care | Payer: BLUE CROSS/BLUE SHIELD | Source: Ambulatory Visit | Attending: Family Medicine | Admitting: Family Medicine

## 2015-01-04 ENCOUNTER — Ambulatory Visit (INDEPENDENT_AMBULATORY_CARE_PROVIDER_SITE_OTHER): Payer: BLUE CROSS/BLUE SHIELD | Admitting: Family Medicine

## 2015-01-04 ENCOUNTER — Encounter: Payer: Self-pay | Admitting: Family Medicine

## 2015-01-04 VITALS — BP 122/78 | HR 99 | Temp 97.3°F | Ht 61.0 in | Wt 148.0 lb

## 2015-01-04 DIAGNOSIS — R059 Cough, unspecified: Secondary | ICD-10-CM

## 2015-01-04 DIAGNOSIS — J40 Bronchitis, not specified as acute or chronic: Secondary | ICD-10-CM | POA: Diagnosis not present

## 2015-01-04 DIAGNOSIS — R05 Cough: Secondary | ICD-10-CM

## 2015-01-04 MED ORDER — AZITHROMYCIN 250 MG PO TABS: ORAL_TABLET | ORAL | Status: DC

## 2015-01-04 MED ORDER — ALBUTEROL SULFATE HFA 108 (90 BASE) MCG/ACT IN AERS
2.0000 | INHALATION_SPRAY | Freq: Four times a day (QID) | RESPIRATORY_TRACT | Status: DC | PRN
Start: 2015-01-04 — End: 2015-01-29

## 2015-01-04 NOTE — Patient Instructions (Addendum)
Nice to see you. You likely have bronchitis. Please go get the chest x-ray. We will treat you with azithromycin for bronchitis. You can continue the cough syrup. Please use the albuterol inhaler 2 puffs every 4-6 hours for the next 2 days and then every 6 hours as needed. If you develop shortness of breath, chest pain, persistent fevers, cough productive of blood, or any new or change in symptoms please seek medical attention.

## 2015-01-04 NOTE — Assessment & Plan Note (Signed)
Patient's symptoms most consistent with bronchitis. Vital signs are stable today. She is given a neb treatment in the office and had improvement in her lung sounds. She noted less congestion following breathing treatment. Given fever, cough, and coarse breath sounds we will obtain a chest x-ray to evaluate for pneumonia. We'll start her on azithromycin for bronchitis. She will use an albuterol inhaler as well. She can continue the cough syrup that she has already. She is given return precautions. Given that she has had frequent and recurrent issues with bronchitis this year she may benefit from PFTs following improvement in her symptoms.

## 2015-01-04 NOTE — Progress Notes (Signed)
Patient ID: Christie Ramirez, female   DOB: 1955/06/16, 59 y.o.   MRN: [id number]  Christie Rumps, MD Phone: [phone]  Christie Ramirez is a 59 y.o. female who presents today for day visit.  Bronchitis: Patient notes symptoms started last week with a right earache and this followed by left earache. She then developed some sore throat. This is subsequently followed by minimal postnasal drip and minimal nasal congestion. She then developed cough. She denies any productive nature of the cough. She has no shortness of breath or chest pressure. She does note chest congestion. She had a temperature on Saturday 101F. She's noted some chills since then. Though has not checked her temperature. She has had sick contacts at work. She notes she has been wheezing. She's been using a dextromethorphan cough syrup with no adverse effects. She has previously been treated with an inhaler for similar issues. She reports having had several other episodes of bronchitis this year.  PMH: nonsmoker. No history of asthma.   ROS see history of present illness  Objective  Physical Exam Filed Vitals:   01/04/15 1100  BP: 122/78  Pulse: 99  Temp: 97.3 F (36.3 C)    Physical Exam  Constitutional:  No acute distress, not diaphoretic  HENT:  Head: Normocephalic and atraumatic.  Right Ear: External ear normal.  Left Ear: External ear normal.  Mouth/Throat: Oropharynx is clear and moist. No oropharyngeal exudate.  Eyes: Conjunctivae are normal. Pupils are equal, round, and reactive to light.  Neck: Neck supple.  Cardiovascular: Normal rate, regular rhythm and normal heart sounds.  Exam reveals no gallop and no friction rub.   No murmur heard. Pulmonary/Chest: Effort normal. No respiratory distress.  Coarse breath sounds throughout, minimal expiratory wheezes  Lymphadenopathy:    She has no cervical adenopathy.  Neurological: She is alert. Gait normal.  Skin: Skin is warm and dry.      Assessment/Plan: Please see individual problem list.  Bronchitis Patient's symptoms most consistent with bronchitis. Vital signs are stable today. She is given a neb treatment in the office and had improvement in her lung sounds. She noted less congestion following breathing treatment. Given fever, cough, and coarse breath sounds we will obtain a chest x-ray to evaluate for pneumonia. We'll start her on azithromycin for bronchitis. She will use an albuterol inhaler as well. She can continue the cough syrup that she has already. She is given return precautions. Given that she has had frequent and recurrent issues with bronchitis this year she may benefit from PFTs following improvement in her symptoms.    Orders Placed This Encounter  Procedures  . DG Chest 2 View    Standing Status: Future     Number of Occurrences:      Standing Expiration Date: 03/06/2016    Order Specific Question:  Reason for Exam (SYMPTOM  OR DIAGNOSIS REQUIRED)    Answer:  Cough, fever, coarse breath sounds    Order Specific Question:  Is the patient pregnant?    Answer:  No    Order Specific Question:  Preferred imaging location?    Answer:  [facility] voice recognition software was used during the dictation process of this note. If any phrases or words seem inappropriate it is likely secondary to the translation process being inefficient.  Christie Ramirez

## 2015-01-04 NOTE — Progress Notes (Signed)
Pre visit review using our clinic review tool, if applicable. No additional management support is needed unless otherwise documented below in the visit note. 

## 2015-01-29 ENCOUNTER — Encounter: Payer: Self-pay | Admitting: Internal Medicine

## 2015-01-29 ENCOUNTER — Ambulatory Visit (INDEPENDENT_AMBULATORY_CARE_PROVIDER_SITE_OTHER): Payer: BLUE CROSS/BLUE SHIELD | Admitting: Internal Medicine

## 2015-01-29 VITALS — BP 126/77 | HR 76 | Temp 98.0°F | Wt 146.4 lb

## 2015-01-29 DIAGNOSIS — J42 Unspecified chronic bronchitis: Secondary | ICD-10-CM | POA: Diagnosis not present

## 2015-01-29 MED ORDER — ZOLPIDEM TARTRATE 10 MG PO TABS: 10.0000 mg | ORAL_TABLET | Freq: Every evening | ORAL | Status: DC | PRN

## 2015-01-29 NOTE — Progress Notes (Signed)
Pre visit review using our clinic review tool, if applicable. No additional management support is needed unless otherwise documented below in the visit note. 

## 2015-01-29 NOTE — Progress Notes (Signed)
Subjective:    Patient ID: Christie Ramirez, female    DOB: [birth year], 60 y.o.   MRN: [id number]  HPI  60YO female presents for follow up.  Recently seen by Dr. Caryl Bis for bronchitis. Treated with Azithromycin and Albuterol. Typically, has bronchitis several times per year. No previous history of asthma. Non-smoker, however parents smoked.  Continues to have occasional cough. Non-productive. Occasional dyspnea with exertion. No chest pain. No fever, chills. Not taking anything for this.    Wt Readings from Last 3 Encounters:  01/29/15 146 lb 6 oz (66.395 kg)  01/04/15 148 lb (67.132 kg)  12/16/14 148 lb 12.8 oz (67.495 kg)   BP Readings from Last 3 Encounters:  01/29/15 126/77  01/04/15 122/78  12/16/14 124/82    Past Medical History  Diagnosis Date  . Hyperlipidemia   . Hypothyroidism   . Anxiety   . Endometriosis 2009  . Cancer ([place]) 1995    thyroid  . 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   . Osteopenia     rt hip   Family History  Problem Relation Age of Onset  . Colon polyps Father     age 18   Past Surgical History  Procedure Laterality Date  . Colectomy  2009    secondary to endometriosis DR. Smith  . Colonoscopy  2009    Dr. Tamala Julian  . Foot surgery  right  . Abdominal hysterectomy  2005    Martin DeFrancesco,MD  . Breast biopsy Right 04-02-13    INVASIVE MAMMARY CARCINOMA WITH FEATURES OF TUBULAR CARCINOMA,  . Breast surgery Right 05/01/13    wide excision  . 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 No malignancy.   Social History   Social History  . Marital Status: Single    Spouse Name: N/A  . Number of Children: N/A  . Years of Education: N/A   Social History Main Topics  . Smoking status: Never Smoker   . Smokeless tobacco: Never Used  . Alcohol Use: 0.0 oz/week    0 Standard drinks or equivalent per week     Comment: wine  . Drug Use: No  . Sexual Activity: No   Other Topics Concern  . None   Social History Narrative    Review of Systems  Constitutional: Negative for fever, chills, appetite change, fatigue and unexpected weight change.  HENT: Negative for congestion, postnasal drip, rhinorrhea, sinus pressure, sore throat and trouble swallowing.   Eyes: Negative for visual disturbance.  Respiratory: Positive for cough and shortness of breath. Negative for chest tightness and wheezing.   Cardiovascular: Negative for chest pain and leg swelling.  Gastrointestinal: Negative for abdominal pain.  Skin: Negative for color change and rash.  Hematological: Negative for adenopathy. Does not bruise/bleed easily.  Psychiatric/Behavioral: Negative for dysphoric mood. The patient is not nervous/anxious.        Objective:    BP 126/77 mmHg  Pulse 76  Temp(Src) 98 F (36.7 C) (Oral)  Wt 146 lb 6 oz (66.395 kg)  SpO2 99% Physical Exam  Constitutional: She is oriented to person, place, and time. She appears well-developed and well-nourished. No distress.  HENT:  Head: Normocephalic and atraumatic.  Right Ear: External ear normal.  Left Ear: External ear normal.  Nose: Nose normal.  Mouth/Throat: Oropharynx is clear and moist. No oropharyngeal exudate.  Eyes: Conjunctivae are normal. Pupils are equal, round, and reactive to light. Right eye exhibits no discharge. Left eye exhibits no discharge. No scleral icterus.  Neck: Normal range of motion. Neck supple. No tracheal deviation present. No thyromegaly present.  Cardiovascular: Normal rate, regular rhythm, normal heart sounds and intact distal pulses.  Exam reveals no gallop and no friction rub.   No murmur heard. Pulmonary/Chest: Effort normal and breath sounds normal. No accessory muscle usage. No tachypnea. No respiratory distress. She has no decreased breath sounds. She has no wheezes. She has  no rhonchi. She has no rales. She exhibits no tenderness.  Musculoskeletal: Normal range of motion. She exhibits no edema or tenderness.  Lymphadenopathy:    She has no cervical adenopathy.  Neurological: She is alert and oriented to person, place, and time. No cranial nerve deficit. She exhibits normal muscle tone. Coordination normal.  Skin: Skin is warm and dry. No rash noted. She is not diaphoretic. No erythema. No pallor.  Psychiatric: She has a normal mood and affect. Her behavior is normal. Judgment and thought content normal.          Assessment & Plan:   Problem List Items Addressed This Visit      Unprioritized   Chronic bronchitis ([place]) - Primary    Recent episode of acute bronchitis. We reviewed her records over the last few years. She has had multiple episodes of bronchitis over the last several years. She is a non-smoker, however her parents smoked in her home. Recent CXR was normal. Exam is normal today. Will set up pulmonary evaluation for PFTs.      Relevant Orders   Ambulatory referral to Pulmonology       Return in about 3 months (around 04/29/2015) for Physical.

## 2015-01-29 NOTE — Patient Instructions (Signed)
We will set up evaluation with pulmonary for lung function tests.  Follow up 3 months and sooner as needed.

## 2015-01-29 NOTE — Assessment & Plan Note (Signed)
Recent episode of acute bronchitis. We reviewed her records over the last few years. She has had multiple episodes of bronchitis over the last several years. She is a non-smoker, however her parents smoked in her home. Recent CXR was normal. Exam is normal today. Will set up pulmonary evaluation for PFTs.

## 2015-01-31 ENCOUNTER — Emergency Department
Admission: EM | Admit: 2015-01-31 | Discharge: 2015-01-31 | Disposition: A | Discharge status: Home / Self Care | Payer: BLUE CROSS/BLUE SHIELD | Attending: Emergency Medicine | Admitting: Emergency Medicine

## 2015-01-31 ENCOUNTER — Emergency Department: Payer: BLUE CROSS/BLUE SHIELD

## 2015-01-31 ENCOUNTER — Encounter: Payer: Self-pay | Admitting: Emergency Medicine

## 2015-01-31 DIAGNOSIS — Z79899 Other long term (current) drug therapy: Secondary | ICD-10-CM | POA: Insufficient documentation

## 2015-01-31 DIAGNOSIS — Z880 Allergy status to penicillin: Secondary | ICD-10-CM | POA: Diagnosis not present

## 2015-01-31 DIAGNOSIS — Y9289 Other specified places as the place of occurrence of the external cause: Secondary | ICD-10-CM | POA: Diagnosis not present

## 2015-01-31 DIAGNOSIS — S99912A Unspecified injury of left ankle, initial encounter: Secondary | ICD-10-CM | POA: Diagnosis present

## 2015-01-31 DIAGNOSIS — S8262XA Displaced fracture of lateral malleolus of left fibula, initial encounter for closed fracture: Secondary | ICD-10-CM | POA: Diagnosis not present

## 2015-01-31 DIAGNOSIS — I10 Essential (primary) hypertension: Secondary | ICD-10-CM | POA: Diagnosis not present

## 2015-01-31 DIAGNOSIS — Y998 Other external cause status: Secondary | ICD-10-CM | POA: Insufficient documentation

## 2015-01-31 DIAGNOSIS — Y9389 Activity, other specified: Secondary | ICD-10-CM | POA: Insufficient documentation

## 2015-01-31 DIAGNOSIS — Z7982 Long term (current) use of aspirin: Secondary | ICD-10-CM | POA: Insufficient documentation

## 2015-01-31 DIAGNOSIS — W1849XA Other slipping, tripping and stumbling without falling, initial encounter: Secondary | ICD-10-CM | POA: Insufficient documentation

## 2015-01-31 MED ORDER — IBUPROFEN 800 MG PO TABS: 800.0000 mg | ORAL_TABLET | Freq: Three times a day (TID) | ORAL | Status: DC | PRN

## 2015-01-31 MED ORDER — HYDROCODONE-ACETAMINOPHEN 5-325 MG PO TABS: 1.0000 | ORAL_TABLET | ORAL | Status: DC | PRN

## 2015-01-31 MED ORDER — PROMETHAZINE HCL 25 MG PO TABS: 25.0000 mg | ORAL_TABLET | Freq: Four times a day (QID) | ORAL | Status: DC | PRN

## 2015-01-31 NOTE — ED Provider Notes (Signed)
[facility] Emergency Department Provider Note  ____________________________________________  Time seen: Approximately 1:06 PM  I have reviewed the triage vital signs and the nursing notes.   HISTORY  Chief Complaint Ankle Pain    HPI Christie Ramirez is a 60 y.o. female presents for evaluation of left ankle pain. She reports that she slipped on the ice prior to arrival.   Past Medical History  Diagnosis Date  . Hyperlipidemia   . Hypothyroidism   . Anxiety   . Endometriosis 2009  . Cancer ([place]) 1995    thyroid  . 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   . Osteopenia     rt hip    Patient Active Problem List   Diagnosis Date Noted  . Chronic bronchitis ([place]) 01/29/2015  . Bronchitis 01/04/2015  . Trapezius strain 12/16/2014  . History of herpes genitalis 09/23/2014  . Endometriosis 09/23/2014  . Piriformis syndrome of left side 07/17/2014  . Obstructive sleep apnea 09/19/2013  . Colon cancer screening 07/28/2013  . Insomnia 06/09/2013  . Breast cancer of upper-inner quadrant of right female breast ([place]) 04/08/2013  . Routine general medical examination at a health care facility 02/18/2013  . Elevated liver function tests 11/18/2012  . Screening for breast cancer 11/18/2012  . Hyperlipidemia LDL goal < 100 06/26/2011  . Hypertension 05/18/2011  . Anxiety 05/18/2011  . Hypothyroidism 10/12/2010  . Depression 10/12/2010    Past Surgical History  Procedure Laterality Date  . Colectomy  2009    secondary to endometriosis DR. Smith  . Colonoscopy  2009    Dr. Tamala Julian  . Foot surgery  right  . Abdominal hysterectomy  2005    Martin DeFrancesco,MD  . Breast biopsy Right 04-02-13    INVASIVE MAMMARY CARCINOMA WITH FEATURES OF TUBULAR CARCINOMA,  . Breast surgery Right 05/01/13    wide excision  . Colon surgery  December 2008   right hemicolectomy for cecal mass identified as endometrioma. No malignancy.    Current Outpatient Rx  Name  Route  Sig  Dispense  Refill  . amLODipine (NORVASC) 5 MG tablet   Oral   Take 1 tablet (5 mg total) by mouth daily.   90 tablet   3   . aspirin 81 MG tablet   Oral   Take 81 mg by mouth daily.         Marland Kitchen atorvastatin (LIPITOR) 40 MG tablet   Oral   Take 1 tablet (40 mg total) by mouth daily.   90 tablet   3   . CALCIUM-MAGNESIUM-ZINC PO   Oral   Take by mouth 3 (three) times daily.         . Cholecalciferol (VITAMIN D-3 PO)   Oral   Take 1,000 Units by mouth.         . clonazePAM (KLONOPIN) 1 MG tablet      TAKE 1 TABLET BY MOUTH 3 TIMES A DAY   90 tablet   1     This request is for a new prescription for a contr ...   . escitalopram (LEXAPRO) 20 MG tablet   Oral   Take 1 tablet (20 mg total) by mouth daily.   90 tablet   3   . GARLIC PO   Oral   Take 600 mg by mouth.         Marland Kitchen HYDROcodone-acetaminophen (NORCO) 5-325  MG tablet   Oral   Take 1-2 tablets by mouth every 4 (four) hours as needed for moderate pain.   15 tablet   0   . ibuprofen (ADVIL,MOTRIN) 800 MG tablet   Oral   Take 1 tablet (800 mg total) by mouth every 8 (eight) hours as needed.   30 tablet   0   . letrozole (FEMARA) 2.5 MG tablet   Oral   Take 2.5 mg by mouth daily.         Marland Kitchen levothyroxine (SYNTHROID, LEVOTHROID) 125 MCG tablet   Oral   Take 1 tablet (125 mcg total) by mouth daily.   90 tablet   3   . losartan (COZAAR) 100 MG tablet   Oral   Take 1 tablet (100 mg total) by mouth daily.   90 tablet   3   . Multiple Vitamins-Minerals (MULTIVITAMIN WITH MINERALS) tablet   Oral   Take 1 tablet by mouth daily.         . promethazine (PHENERGAN) 25 MG tablet   Oral   Take 1 tablet (25 mg total) by mouth every 6 (six) hours as needed for nausea or vomiting.   12 tablet   0   . vitamin C (ASCORBIC ACID) 500 MG tablet   Oral   Take 500 mg by mouth  daily.         Marland Kitchen zolpidem (AMBIEN) 10 MG tablet   Oral   Take 1 tablet (10 mg total) by mouth at bedtime as needed.   30 tablet   4     Allergies Codeine; Ace inhibitors; Iodinated diagnostic agents; and Penicillins  Family History  Problem Relation Age of Onset  . Colon polyps Father     age 9    Social History Social History  Substance Use Topics  . Smoking status: Never Smoker   . Smokeless tobacco: Never Used  . Alcohol Use: 0.0 oz/week    0 Standard drinks or equivalent per week     Comment: wine    Review of Systems Constitutional: No fever/chills Eyes: No visual changes. ENT: No sore throat. Cardiovascular: Denies chest pain. Respiratory: Denies shortness of breath. Gastrointestinal: No abdominal pain.  No nausea, no vomiting.  No diarrhea.  No constipation. Genitourinary: Negative for dysuria. Musculoskeletal: Positive for left ankle pain. Skin: Negative for rash. Neurological: Negative for headaches, focal weakness or numbness.  10-point ROS otherwise negative.  ____________________________________________   PHYSICAL EXAM:  VITAL SIGNS: ED Triage Vitals  Enc Vitals Group     BP 01/31/15 1253 133/87 mmHg     Pulse Rate 01/31/15 1253 99     Resp 01/31/15 1253 18     Temp 01/31/15 1253 98 F (36.7 C)     Temp Source 01/31/15 1253 Oral     SpO2 01/31/15 1253 97 %     Weight --      Height --      Head Cir --      Peak Flow --      Pain Score 01/31/15 1243 9     Pain Loc --      Pain Edu? --      Excl. in [place]? --     Constitutional: Alert and oriented. Well appearing and in no acute distress. Eyes: Conjunctivae are normal. PERRL. EOMI. Head: Atraumatic. Nose: No congestion/rhinnorhea. Mouth/Throat: Mucous membranes are moist.  Oropharynx non-erythematous. Neck: No stridor.   Cardiovascular: Normal rate, regular rhythm. Grossly normal heart sounds.  Good peripheral circulation. Respiratory: Normal respiratory effort.  No retractions.  Lungs CTAB. Musculoskeletal: Left ankle with limited range of motion. Distally neurovascularly intact. Negative for ecchymosis or bruising. Positive for edema and swelling both medially and laterally. Neurologic:  Normal speech and language. No gross focal neurologic deficits are appreciated. No gait instability. Skin:  Skin is warm, dry and intact. No rash noted. Psychiatric: Mood and affect are normal. Speech and behavior are normal.  ____________________________________________   LABS (all labs ordered are listed, but only abnormal results are displayed)  Labs Reviewed - No data to display ____________________________________________   RADIOLOGY  IMPRESSION: Left Ankle: Slightly displaced, obliquely-oriented, fracture of the lateral malleolus. ____________________________________________   PROCEDURES  Procedure(s) performed: None  Critical Care performed: No  ____________________________________________   INITIAL IMPRESSION / ASSESSMENT AND PLAN / ED COURSE  Pertinent labs & imaging results that were available during my care of the patient were reviewed by me and considered in my medical decision making (see chart for details).  Closed lateral malleolus fracture. Posterior splint applied and crutches given. Patient to follow-up with orthopedics on call this week for further evaluation and possible casting. Patient voices no other emergency medical complaints at this time. Patient was distally neurovascularly intact after splinting. ____________________________________________   FINAL CLINICAL IMPRESSION(S) / ED DIAGNOSES  Final diagnoses:  Closed low lateral malleolus fracture, left, initial encounter      Arlyss Repress, PA-C 01/31/15 1438  Eula Listen, MD 01/31/15 1502

## 2015-01-31 NOTE — Discharge Instructions (Signed)
Ankle Fracture A fracture is a break in a bone. The ankle joint is made up of three bones. These include the lower (distal)sections of your lower leg bones, called the tibia and fibula, along with a bone in your foot, called the talus. Depending on how bad the break is and if more than one ankle joint bone is broken, a cast or splint is used to protect and keep your injured bone from moving while it heals. Sometimes, surgery is required to help the fracture heal properly.  There are two general types of fractures:  Stable fracture. This includes a single fracture line through one bone, with no injury to ankle ligaments. A fracture of the talus that does not have any displacement (movement of the bone on either side of the fracture line) is also stable.  Unstable fracture. This includes more than one fracture line through one or more bones in the ankle joint. It also includes fractures that have displacement of the bone on either side of the fracture line. CAUSES  A direct blow to the ankle.   Quickly and severely twisting your ankle.  Trauma, such as a car accident or falling from a significant height. RISK FACTORS You may be at a higher risk of ankle fracture if:  You have certain medical conditions.  You are involved in high-impact sports.  You are involved in a high-impact car accident. SIGNS AND SYMPTOMS   Tender and swollen ankle.  Bruising around the injured ankle.  Pain on movement of the ankle.  Difficulty walking or putting weight on the ankle.  A cold foot below the site of the ankle injury. This can occur if the blood vessels passing through your injured ankle were also damaged.  Numbness in the foot below the site of the ankle injury. DIAGNOSIS  An ankle fracture is usually diagnosed with a physical exam and X-rays. A CT scan may also be required for complex fractures. TREATMENT  Stable fractures are treated with a cast or splint and using crutches to avoid putting  weight on your injured ankle. This is followed by an ankle strengthening program. Some patients require a special type of cast, depending on other medical problems they may have. Unstable fractures require surgery to ensure the bones heal properly. Your health care provider will tell you what type of fracture you have and the best treatment for your condition. HOME CARE INSTRUCTIONS   Review correct crutch use with your health care provider and use your crutches as directed. Safe use of crutches is extremely important. Misuse of crutches can cause you to fall or cause injury to nerves in your hands or armpits.  Do not put weight or pressure on the injured ankle until directed by your health care provider.  To lessen the swelling, keep the injured leg elevated while sitting or lying down.  Apply ice to the injured area:  Put ice in a plastic bag.  Place a towel between your cast and the bag.  Leave the ice on for 20 minutes, 2-3 times a day.  If you have a plaster or fiberglass cast:  Do not try to scratch the skin under the cast with any objects. This can increase your risk of skin infection.  Check the skin around the cast every day. You may put lotion on any red or sore areas.  Keep your cast dry and clean.  If you have a plaster splint:  Wear the splint as directed.  You may loosen the elastic   around the splint if your toes become numb, tingle, or turn cold or blue.  Do not put pressure on any part of your cast or splint; it may break. Rest your cast only on a pillow the first 24 hours until it is fully hardened.  Your cast or splint can be protected during bathing with a plastic bag sealed to your skin with medical tape. Do not lower the cast or splint into water.  Take medicines as directed by your health care provider. Only take over-the-counter or prescription medicines for pain, discomfort, or fever as directed by your health care provider.  Do not drive a vehicle until  your health care provider specifically tells you it is safe to do so.  If your health care provider has given you a follow-up appointment, it is very important to keep that appointment. Not keeping the appointment could result in a chronic or permanent injury, pain, and disability. If you have any problem keeping the appointment, call the facility for assistance. SEEK MEDICAL CARE IF: You develop increased swelling or discomfort. SEEK IMMEDIATE MEDICAL CARE IF:   Your cast gets damaged or breaks.  You have continued severe pain.  You develop new pain or swelling after the cast was put on.  Your skin or toenails below the injury turn blue or gray.  Your skin or toenails below the injury feel cold, numb, or have loss of sensitivity to touch.  There is a bad smell or pus draining from under the cast. MAKE SURE YOU:   Understand these instructions.  Will watch your condition.  Will get help right away if you are not doing well or get worse.   This information is not intended to replace advice given to you by your health care provider. Make sure you discuss any questions you have with your health care provider.   Document Released: 01/07/2000 Document Revised: 01/14/2013 Document Reviewed: 08/08/2012 Elsevier Interactive Patient Education 2016 [employer]  

## 2015-01-31 NOTE — ED Notes (Signed)
Reports slipped on ice pain to left ankle

## 2015-02-02 ENCOUNTER — Encounter: Payer: Self-pay | Admitting: Nurse Practitioner

## 2015-02-02 ENCOUNTER — Ambulatory Visit (INDEPENDENT_AMBULATORY_CARE_PROVIDER_SITE_OTHER): Payer: BLUE CROSS/BLUE SHIELD | Admitting: Nurse Practitioner

## 2015-02-02 VITALS — BP 120/78 | HR 94 | Temp 98.0°F | Resp 16 | Ht 61.0 in | Wt 139.4 lb

## 2015-02-02 DIAGNOSIS — S82892A Other fracture of left lower leg, initial encounter for closed fracture: Secondary | ICD-10-CM | POA: Diagnosis not present

## 2015-02-02 NOTE — Progress Notes (Signed)
Patient ID: Christie Ramirez, female    DOB: 02/27/1955  Age: 60 y.o. MRN: [id number]  CC: Hospitalization Follow-up   HPI Christie Ramirez presents for follow up from ED for left ankle pain.   1) [place] ED on 01/31/15 for ankle fracture Slipped on ice and went to ED for ankle pain and swelling on left   X-ray- left slightly displaced, obliquely oriented fx of lateral malleolus. Treatment was crutches, posterior splinting  FU with Ortho for further evaluation    Today: Pt reports still having swelling and pain, works 2 jobs- needs to have note to remain out of work until seen by orthopedics. Pain is in proportion to injury per pt and she is able to adequately get around on her crutches without difficulty   History Christie Ramirez has a past medical history of Hyperlipidemia; Hypothyroidism; Anxiety; Endometriosis (2009); Cancer ([place]) (1995); Breast cancer of upper-inner quadrant of right female breast ([place]) (05/01/2013); and Osteopenia.   She has past surgical history that includes colectomy (2009); Colonoscopy (2009); Foot surgery (right); Abdominal hysterectomy (2005); Breast biopsy (Right, 04-02-13); Breast surgery (Right, 05/01/13); and Colon surgery (December 2008).   Her family history includes Colon polyps in her father.She reports that she has never smoked. She has never used smokeless tobacco. She reports that she drinks alcohol. She reports that she does not use illicit drugs.  Outpatient Prescriptions Prior to Visit  Medication Sig Dispense Refill  . amLODipine (NORVASC) 5 MG tablet Take 1 tablet (5 mg total) by mouth daily. 90 tablet 3  . aspirin 81 MG tablet Take 81 mg by mouth daily.    Marland Kitchen atorvastatin (LIPITOR) 40 MG tablet Take 1 tablet (40 mg total) by mouth daily. 90 tablet 3  . CALCIUM-MAGNESIUM-ZINC PO Take by mouth 3 (three) times daily.    . Cholecalciferol (VITAMIN D-3 PO) Take 1,000 Units by mouth.    . clonazePAM (KLONOPIN) 1 MG tablet TAKE 1 TABLET BY MOUTH 3 TIMES A DAY 90  tablet 1  . escitalopram (LEXAPRO) 20 MG tablet Take 1 tablet (20 mg total) by mouth daily. 90 tablet 3  . GARLIC PO Take 0000000 mg by mouth.    Marland Kitchen HYDROcodone-acetaminophen (NORCO) 5-325 MG tablet Take 1-2 tablets by mouth every 4 (four) hours as needed for moderate pain. 15 tablet 0  . letrozole (FEMARA) 2.5 MG tablet Take 2.5 mg by mouth daily.    Marland Kitchen levothyroxine (SYNTHROID, LEVOTHROID) 125 MCG tablet Take 1 tablet (125 mcg total) by mouth daily. 90 tablet 3  . losartan (COZAAR) 100 MG tablet Take 1 tablet (100 mg total) by mouth daily. 90 tablet 3  . Multiple Vitamins-Minerals (MULTIVITAMIN WITH MINERALS) tablet Take 1 tablet by mouth daily.    . promethazine (PHENERGAN) 25 MG tablet Take 1 tablet (25 mg total) by mouth every 6 (six) hours as needed for nausea or vomiting. 12 tablet 0  . vitamin C (ASCORBIC ACID) 500 MG tablet Take 500 mg by mouth daily.    Marland Kitchen zolpidem (AMBIEN) 10 MG tablet Take 1 tablet (10 mg total) by mouth at bedtime as needed. 30 tablet 4  . ibuprofen (ADVIL,MOTRIN) 800 MG tablet Take 1 tablet (800 mg total) by mouth every 8 (eight) hours as needed. 30 tablet 0   No facility-administered medications prior to visit.    ROS Review of Systems  Constitutional: Negative for fever, chills, diaphoresis and fatigue.  Respiratory: Negative for choking, shortness of breath and wheezing.   Cardiovascular: Negative for chest pain, palpitations and leg  swelling.  Musculoskeletal: Positive for myalgias and arthralgias.       Left ankle  Skin: Negative for color change, pallor, rash and wound.  Neurological: Negative for weakness and numbness.    Objective:  BP 120/78 mmHg  Pulse 94  Temp(Src) 98 F (36.7 C) (Oral)  Resp 16  Ht 5\' 1"  (1.549 m)  Wt 139 lb 6.4 oz (63.231 kg)  BMI 26.35 kg/m2  SpO2 97%  Physical Exam  Constitutional: She is oriented to person, place, and time. She appears well-developed and well-nourished. No distress.  HENT:  Head: Normocephalic and  atraumatic.  Right Ear: External ear normal.  Left Ear: External ear normal.  Cardiovascular:  Brisk capillary refill of lower left extremity   Musculoskeletal: She exhibits edema and tenderness.  Swelling of toes on left, not more than expected No discoloration of toes, able to move them Decreased ROM of ankle due to splinting  Neurological: She is alert and oriented to person, place, and time. She exhibits normal muscle tone.  Skin: Skin is warm and dry. No rash noted. She is not diaphoretic.  Psychiatric: She has a normal mood and affect. Her behavior is normal. Judgment and thought content normal.   Assessment & Plan:   Christie Ramirez was seen today for hospitalization follow-up.  Diagnoses and all orders for this visit:  Ankle fracture, left  I am having Christie Ramirez maintain her multivitamin with minerals, aspirin, losartan, levothyroxine, escitalopram, atorvastatin, amLODipine, letrozole, clonazePAM, CALCIUM-MAGNESIUM-ZINC PO, Cholecalciferol (VITAMIN D-3 PO), vitamin C, GARLIC PO, zolpidem, HYDROcodone-acetaminophen, promethazine, metroNIDAZOLE, ibuprofen, cyclobenzaprine, and PROVENTIL HFA.  Meds ordered this encounter  Medications  . metroNIDAZOLE (FLAGYL) 500 MG tablet    Sig: Reported on 02/02/2015    Refill:  0  . ibuprofen (ADVIL,MOTRIN) 400 MG tablet    Sig:     Refill:  0  . cyclobenzaprine (FLEXERIL) 10 MG tablet    Sig: TAKE 1 TABLET (10 MG TOTAL) BY MOUTH 3 (THREE) TIMES DAILY AS NEEDED FOR MUSCLE SPASMS.    Refill:  0  . PROVENTIL HFA 108 (90 Base) MCG/ACT inhaler    Sig: INHALE 2 PUFFS INTO THE LUNGS EVERY 6 (SIX) HOURS AS NEEDED FOR WHEEZING OR SHORTNESS OF BREATH.    Refill:  0     Follow-up: Return if symptoms worsen or fail to improve.

## 2015-02-02 NOTE — Patient Instructions (Signed)
Please stay out of work on off your feet until you see your Orthopedic doctor.   Ice, elevation, and rest :)

## 2015-02-03 ENCOUNTER — Ambulatory Visit: Payer: BLUE CROSS/BLUE SHIELD | Admitting: Nurse Practitioner

## 2015-02-07 DIAGNOSIS — S82892A Other fracture of left lower leg, initial encounter for closed fracture: Secondary | ICD-10-CM | POA: Insufficient documentation

## 2015-02-07 NOTE — Assessment & Plan Note (Signed)
Subsequent follow up after ER visit Stable Exam was reassuring  Will follow up with Ortho Note for work given to pt FU prn worsening/failure to improve.   RICE measures reinforced

## 2015-02-12 ENCOUNTER — Ambulatory Visit: Payer: BLUE CROSS/BLUE SHIELD | Admitting: Internal Medicine

## 2015-02-22 ENCOUNTER — Ambulatory Visit (INDEPENDENT_AMBULATORY_CARE_PROVIDER_SITE_OTHER): Payer: BLUE CROSS/BLUE SHIELD | Admitting: Pulmonary Disease

## 2015-02-22 ENCOUNTER — Encounter: Payer: Self-pay | Admitting: Pulmonary Disease

## 2015-02-22 VITALS — BP 124/72 | HR 120 | Ht 61.0 in | Wt 144.0 lb

## 2015-02-22 DIAGNOSIS — Z91048 Other nonmedicinal substance allergy status: Secondary | ICD-10-CM | POA: Diagnosis not present

## 2015-02-22 DIAGNOSIS — R0600 Dyspnea, unspecified: Secondary | ICD-10-CM | POA: Diagnosis not present

## 2015-02-22 DIAGNOSIS — J4530 Mild persistent asthma, uncomplicated: Secondary | ICD-10-CM

## 2015-02-22 DIAGNOSIS — Z9109 Other allergy status, other than to drugs and biological substances: Secondary | ICD-10-CM

## 2015-02-22 MED ORDER — FLUTICASONE FUROATE 100 MCG/ACT IN AEPB: 1.0000 | INHALATION_SPRAY | Freq: Every day | RESPIRATORY_TRACT | Status: AC

## 2015-02-22 MED ORDER — FLUTICASONE FUROATE 100 MCG/ACT IN AEPB: 1.0000 | INHALATION_SPRAY | Freq: Every day | RESPIRATORY_TRACT | Status: DC

## 2015-02-22 NOTE — Patient Instructions (Addendum)
Arnuity - one inhalation daily Continue Proventil inhaler as needed for wheezing or chest tightness Zyrtec (cetirizine) as needed for allergy symptoms Follow up in 6-8 weeks with lung function tests

## 2015-02-24 ENCOUNTER — Encounter: Payer: Self-pay | Admitting: Pulmonary Disease

## 2015-02-24 NOTE — Progress Notes (Signed)
PULMONARY CONSULT NOTE  Requesting MD/Service: Geoffry Paradise, MD Date of initial consultation: 02/22/15  Reason for consultation: Dyspnea  PT PROFILE: 60 y.o. F with frequent bronchitis including during childhood, seasonal and perennial allergies, episodic dyspnea with initial impression of asthma  HPI:  60 y.o. F who provides a history of recurrent bronchitis over past 2 years, noting 2 episodes in past 2 months. Her episodes typically start with otalgia, then sore throat and progress to chest tightness, cough productive of scant yellow mucus and dyspnea. She has been treated with antibiotics and cough suppressants. Her symptoms usually resolve in days to weeks. At times, dyspnea can be triggered by exposure to strong odors. She notes an increase in symptoms during the spring and autumn attributing this to pollen exposure. She has seasonal allergies. She also notes that cold weather exposure can trigger symptoms. She reports that she also suffered frequent bronchitis as a child but then "grew out of it". She has an albuterol inhaler that does improve her symptoms.  Past Medical History  Diagnosis Date  . Hyperlipidemia   . Hypothyroidism   . Anxiety   . Endometriosis 2009  . Cancer ([place]) 1995    thyroid  . 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   . Osteopenia     rt hip    Past Surgical History  Procedure Laterality Date  . Colectomy  2009    secondary to endometriosis DR. Smith  . Colonoscopy  2009    Dr. Tamala Julian  . Foot surgery  right  . Abdominal hysterectomy  2005    Martin DeFrancesco,MD  . Breast biopsy Right 04-02-13    INVASIVE MAMMARY CARCINOMA WITH FEATURES OF TUBULAR CARCINOMA,  . Breast surgery Right 05/01/13    wide excision  . 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 No malignancy.    MEDICATIONS:  I have reviewed all medications and confirmed regimen as documented  Social History   Social History  . Marital Status: Single    Spouse Name: N/A  . Number of Children: N/A  . Years of Education: N/A   Occupational History  . Not on file.   Social History Main Topics  . Smoking status: Never Smoker   . Smokeless tobacco: Never Used  . Alcohol Use: 0.0 oz/week    0 Standard drinks or equivalent per week     Comment: wine  . Drug Use: No  . Sexual Activity: No   Other Topics Concern  . Not on file   Social History Narrative    Family History  Problem Relation Age of Onset  . Colon polyps Father     age 71    ROS: No fever, myalgias/arthralgias, unexplained weight loss or weight gain No new focal weakness or sensory deficits No hearing loss, visual changes No neck pain or adenopathy No abdominal pain, N/V/D, diarrhea, change in bowel pattern No dysuria, change in urinary pattern No LE edema or calf tenderness   Filed Vitals:   02/22/15 1535  BP: 124/72  Pulse: 120  Height: 5\' 1"  (1.549 m)  Weight: 144 lb (65.318 kg)  SpO2: 95%     EXAM:  Gen: WDWN, No overt respiratory distress HEENT: NCAT, TMs and canals normal, sclera white, nares and nasal mucosa normal, oropharynx normal Neck: Supple without LAN, thyromegaly, JVD Lungs: breath sounds full, percussion  note normal, No wheezes Cardiovascular: Reg rhythm, rate normal, no murmurs noted Abdomen: Soft, nontender, normal BS Ext: without clubbing, cyanosis, edema Neuro: CNs grossly intact, motor and sensory intact, DTRs symmetric Skin: Limited exam, no lesions noted  DATA:   BMP Latest Ref Rng 11/06/2014 05/07/2014 04/27/2014  Glucose 70 - 99 mg/dL 96 88 124(H)  BUN 6 - 23 mg/dL 20 16 23(H)  Creatinine 0.40 - 1.20 mg/dL 1.09 0.70 0.74  Sodium 135 - 145 mEq/L 138 138 136  Potassium 3.5 - 5.1 mEq/L 4.0 4.7 4.0  Chloride 96 - 112 mEq/L 102 100 101  CO2 19 - 32 mEq/L 27 32 25  Calcium 8.4 - 10.5 mg/dL 9.2  9.7 8.5(L)    CBC Latest Ref Rng 11/06/2014 05/07/2014 04/27/2014  WBC 4.0 - 10.5 K/uL 7.1 8.8 6.2  Hemoglobin 12.0 - 15.0 g/dL 12.4 12.4 12.9  Hematocrit 36.0 - 46.0 % 36.9 37.1 38.9  Platelets 150.0 - 400.0 K/uL 351.0 436.0(H) 273    CXR (01/04/15): [facility]  IMPRESSION:     ICD-9-CM ICD-10-CM   1. Asthma, mild persistent, uncomplicated 123456 [id number] Pulmonary function test  2. Environmental allergies V15.09 Z91.048   3. Dyspnea 786.09 R06.00    DISCUSSION: her history is highly suggestive of asthma which is likely mild persistent   PLAN:  1) Steroid inhaler - Anoro. Sample provided and Rx sent to pharmacy 2) Cont PRN albuterol - keep track of frequency of use as a gauge of asthma control 3) PRN Zyrtec for allergy symptoms - this may be purchased OTC 4) ROV 6-8 weeks with full PFTs   Wilhelmina Mcardle, MD [facility]

## 2015-02-25 ENCOUNTER — Encounter: Payer: Self-pay | Admitting: *Deleted

## 2015-03-18 ENCOUNTER — Ambulatory Visit: Payer: BLUE CROSS/BLUE SHIELD | Admitting: Internal Medicine

## 2015-04-20 ENCOUNTER — Ambulatory Visit: Payer: BLUE CROSS/BLUE SHIELD | Admitting: Pulmonary Disease

## 2015-04-24 ENCOUNTER — Other Ambulatory Visit: Payer: Self-pay | Admitting: Internal Medicine

## 2015-04-27 ENCOUNTER — Ambulatory Visit: Payer: BLUE CROSS/BLUE SHIELD | Admitting: Internal Medicine

## 2015-04-27 ENCOUNTER — Telehealth: Payer: Self-pay | Admitting: Internal Medicine

## 2015-04-27 DIAGNOSIS — Z0289 Encounter for other administrative examinations: Secondary | ICD-10-CM

## 2015-04-27 NOTE — Telephone Encounter (Signed)
Yes, should be a no-show

## 2015-04-27 NOTE — Telephone Encounter (Signed)
See below , thanks

## 2015-04-27 NOTE — Telephone Encounter (Signed)
FYI, Pt called and stated that she overslept for her today. Pt was very apologetic. Appt is still on the sch. Pt did make another appt. Let me know if the appt needs a no show or not. Thank you!

## 2015-04-27 NOTE — Telephone Encounter (Signed)
Please advise for no show? Thanks

## 2015-04-27 NOTE — Telephone Encounter (Signed)
Ok   Done  Thank you

## 2015-05-10 ENCOUNTER — Other Ambulatory Visit: Payer: Self-pay | Admitting: Internal Medicine

## 2015-05-10 NOTE — Telephone Encounter (Signed)
Last seen in January & next appt in May. Okay to refill?

## 2015-05-11 NOTE — Telephone Encounter (Signed)
REFILLED FOR 60 DAYS

## 2015-05-11 NOTE — Telephone Encounter (Signed)
Rx faxed

## 2015-05-12 ENCOUNTER — Ambulatory Visit: Payer: BLUE CROSS/BLUE SHIELD | Admitting: General Surgery

## 2015-05-24 ENCOUNTER — Telehealth: Payer: Self-pay | Admitting: *Deleted

## 2015-05-24 NOTE — Telephone Encounter (Signed)
Left message for patient to call the office back to reschedule her appointment for follow up mammogram. Patient rescheduled her mammogram appointment at [facility]

## 2015-05-27 ENCOUNTER — Ambulatory Visit: Payer: BLUE CROSS/BLUE SHIELD | Admitting: General Surgery

## 2015-06-01 ENCOUNTER — Encounter: Payer: BLUE CROSS/BLUE SHIELD | Admitting: Internal Medicine

## 2015-06-01 DIAGNOSIS — Z0289 Encounter for other administrative examinations: Secondary | ICD-10-CM

## 2015-06-02 ENCOUNTER — Ambulatory Visit: Payer: BLUE CROSS/BLUE SHIELD | Admitting: Internal Medicine

## 2015-06-08 ENCOUNTER — Ambulatory Visit: Payer: BLUE CROSS/BLUE SHIELD | Admitting: General Surgery

## 2015-06-14 ENCOUNTER — Encounter: Payer: BLUE CROSS/BLUE SHIELD | Admitting: Internal Medicine

## 2015-06-23 ENCOUNTER — Encounter: Payer: Self-pay | Admitting: Obstetrics and Gynecology

## 2015-06-30 ENCOUNTER — Encounter: Payer: Self-pay | Admitting: *Deleted

## 2015-07-02 ENCOUNTER — Encounter: Payer: Self-pay | Admitting: General Surgery

## 2015-07-02 DIAGNOSIS — Z08 Encounter for follow-up examination after completed treatment for malignant neoplasm: Secondary | ICD-10-CM | POA: Diagnosis not present

## 2015-07-02 DIAGNOSIS — Z853 Personal history of malignant neoplasm of breast: Secondary | ICD-10-CM | POA: Diagnosis not present

## 2015-07-02 DIAGNOSIS — R921 Mammographic calcification found on diagnostic imaging of breast: Secondary | ICD-10-CM | POA: Diagnosis not present

## 2015-07-02 DIAGNOSIS — R928 Other abnormal and inconclusive findings on diagnostic imaging of breast: Secondary | ICD-10-CM | POA: Diagnosis not present

## 2015-07-07 ENCOUNTER — Ambulatory Visit: Payer: BLUE CROSS/BLUE SHIELD | Admitting: General Surgery

## 2015-07-09 NOTE — Telephone Encounter (Signed)
error 

## 2015-07-21 ENCOUNTER — Encounter: Payer: Self-pay | Admitting: Obstetrics and Gynecology

## 2015-07-21 ENCOUNTER — Ambulatory Visit (INDEPENDENT_AMBULATORY_CARE_PROVIDER_SITE_OTHER): Payer: BLUE CROSS/BLUE SHIELD | Admitting: Obstetrics and Gynecology

## 2015-07-21 VITALS — BP 145/86 | HR 114 | Ht 61.0 in | Wt 154.7 lb

## 2015-07-21 DIAGNOSIS — Z90710 Acquired absence of both cervix and uterus: Secondary | ICD-10-CM

## 2015-07-21 DIAGNOSIS — N809 Endometriosis, unspecified: Secondary | ICD-10-CM | POA: Diagnosis not present

## 2015-07-21 DIAGNOSIS — Z0000 Encounter for general adult medical examination without abnormal findings: Secondary | ICD-10-CM

## 2015-07-21 DIAGNOSIS — R300 Dysuria: Secondary | ICD-10-CM

## 2015-07-21 DIAGNOSIS — Z01419 Encounter for gynecological examination (general) (routine) without abnormal findings: Secondary | ICD-10-CM

## 2015-07-21 DIAGNOSIS — Z8619 Personal history of other infectious and parasitic diseases: Secondary | ICD-10-CM | POA: Diagnosis not present

## 2015-07-21 DIAGNOSIS — Z1211 Encounter for screening for malignant neoplasm of colon: Secondary | ICD-10-CM

## 2015-07-21 LAB — POCT URINALYSIS DIPSTICK
BILIRUBIN UA: NEGATIVE
Glucose, UA: NEGATIVE
KETONES UA: NEGATIVE
NITRITE UA: NEGATIVE
PH UA: 7
Protein, UA: NEGATIVE
RBC UA: NEGATIVE
Spec Grav, UA: <=1.005
Urobilinogen, UA: 0.2

## 2015-07-21 MED ORDER — ACYCLOVIR 400 MG PO TABS: ORAL_TABLET | ORAL | Status: DC

## 2015-07-21 NOTE — Progress Notes (Signed)
Patient ID: Christie Ramirez, female   DOB: Sep 11, 1955, 60 y.o.   MRN: [id number] ANNUAL PREVENTATIVE CARE GYN  ENCOUNTER NOTE  Subjective:       Christie Ramirez is a 60 y.o. No obstetric history on file. female here for a routine annual gynecologic exam.  Current complaints: 1.  UTI- burning-symptoms worse over the past several days 2. 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 Left ankle fracture, recurrent, problematic, proceeding with second opinion Christie Ramirez  Currently in a new relationship with probable marriage in the near future.  Both reportedly with negative STI history     Gynecologic History No LMP recorded. Patient has had a hysterectomy. Contraception: status post hysterectomy Last Pap: 04/2013 neg/neg. Results were: normal Last mammogram: thru dr brynett. Results were: normal History of endometriosis  Obstetric History OB History  No data available    Past Medical History  Diagnosis Date  . Hyperlipidemia   . Hypothyroidism   . Anxiety   . Endometriosis 2009  . Cancer ([place]) 1995    thyroid  . Breast cancer of upper-inner quadrant of right female breast (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   . Osteopenia     rt hip    Past Surgical History  Procedure Laterality Date  . Colectomy  2009    secondary to endometriosis Christie Ramirez  . Colonoscopy  2009    Christie Ramirez  . Foot surgery  right  . Abdominal hysterectomy  2005    Christie Aziz,MD  . Breast biopsy Right 04-02-13    INVASIVE MAMMARY CARCINOMA WITH FEATURES OF TUBULAR CARCINOMA,  . Breast surgery Right 05/01/13    wide excision  . 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 No malignancy.    Current Outpatient Prescriptions on File Prior to Visit  Medication Sig Dispense Refill  . amLODipine (NORVASC) 5 MG tablet Take 1  tablet (5 mg total) by mouth daily. 90 tablet 3  . aspirin 81 MG tablet Take 81 mg by mouth daily.    Marland Kitchen atorvastatin (LIPITOR) 40 MG tablet Take 1 tablet (40 mg total) by mouth daily. 90 tablet 3  . CALCIUM-MAGNESIUM-ZINC PO Take by mouth 3 (three) times daily.    . Cholecalciferol (VITAMIN D-3 PO) Take 1,000 Units by mouth.    . clonazePAM (KLONOPIN) 1 MG tablet TAKE 1 TABLET BY MOUTH 3 TIMES A DAY 90 tablet 1  . escitalopram (LEXAPRO) 20 MG tablet Take 1 tablet (20 mg total) by mouth daily. 90 tablet 3  . GARLIC PO Take 0000000 mg by mouth.    . levothyroxine (SYNTHROID, LEVOTHROID) 125 MCG tablet TAKE 1 TABLET (125 MCG TOTAL) BY MOUTH DAILY. 90 tablet 3  . losartan (COZAAR) 100 MG tablet Take 1 tablet (100 mg total) by mouth daily. 90 tablet 3  . Multiple Vitamins-Minerals (MULTIVITAMIN WITH MINERALS) tablet Take 1 tablet by mouth daily.    . vitamin C (ASCORBIC ACID) 500 MG tablet Take 500 mg by mouth daily.    Marland Kitchen zolpidem (AMBIEN) 10 MG tablet TAKE 1 TABLET BY MOUTH AT BEDTIME AS NEEDED 30 tablet 1   No current facility-administered medications on file prior to visit.    Allergies  Allergen Reactions  . Codeine Anaphylaxis  . Ace Inhibitors   . Iodinated Diagnostic Agents Hives  . Penicillins Rash  Social History   Social History  . Marital Status: Single    Spouse Name: N/A  . Number of Children: N/A  . Years of Education: N/A   Occupational History  . Not on file.   Social History Main Topics  . Smoking status: Never Smoker   . Smokeless tobacco: Never Used  . Alcohol Use: 0.0 oz/week    0 Standard drinks or equivalent per week     Comment: wine- daily  . Drug Use: No  . Sexual Activity: Yes    Birth Control/ Protection: Surgical   Other Topics Concern  . Not on file   Social History Narrative    Family History  Problem Relation Age of Onset  . Colon polyps Father     age 4  . Heart disease Father   . Heart disease Mother   . Heart disease Paternal  Grandmother   . Heart disease Paternal Grandfather   . Cancer Neg Hx   . Diabetes Neg Hx     The following portions of the patient's history were reviewed and updated as appropriate: allergies, current medications, past family history, past medical history, past social history, past surgical history and problem list.  Review of Systems ROS Review of Systems - General ROS: negative for - chills, fatigue, fever, hot flashes, night sweats, weight gain or weight loss Psychological ROS: negative for - anxiety, decreased libido, depression, mood swings, physical abuse or sexual abuse Ophthalmic ROS: negative for - blurry vision, eye pain or loss of vision ENT ROS: negative for - headaches, hearing change, visual changes or vocal changes Allergy and Immunology ROS: negative for - hives, itchy/watery eyes or seasonal allergies Hematological and Lymphatic ROS: negative for - bleeding problems, bruising, swollen lymph nodes or weight loss Endocrine ROS: negative for - galactorrhea, hair pattern changes, hot flashes, malaise/lethargy, mood swings, palpitations, polydipsia/polyuria, skin changes, temperature intolerance or unexpected weight changes Breast ROS: negative for - new or changing breast lumps or nipple discharge Respiratory ROS: negative for - cough or shortness of breath Cardiovascular ROS: negative for - chest pain, irregular heartbeat, palpitations or shortness of breath Gastrointestinal ROS: no abdominal pain, change in bowel habits, or black or bloody stools Genito-Urinary ROS: no dysuria, trouble voiding, or hematuria Musculoskeletal ROS: negative for - joint pain or joint stiffness Neurological ROS: negative for - bowel and bladder control changes Dermatological ROS: negative for rash and skin lesion changes   Objective:   BP 145/86 mmHg  Pulse 114  Ht 5\' 1"  (1.549 m)  Wt 154 lb 11.2 oz (70.171 kg)  BMI 29.25 kg/m2 CONSTITUTIONAL: Well-developed, well-nourished female in no  acute distress.  PSYCHIATRIC: Normal mood and affect. Normal behavior. Normal judgment and thought content. [place]: Alert and oriented to person, place, and time. Normal muscle tone coordination. No cranial nerve deficit noted. HENT:  Normocephalic, atraumatic, External right and left ear normal. Oropharynx is clear and moist EYES: Conjunctivae and EOM are normal. Pupils are equal, round, and reactive to light. No scleral icterus.  NECK: Normal range of motion, supple, no masses.  Normal thyroid.  SKIN: Skin is warm and dry. No rash noted. Not diaphoretic. No erythema. No pallor. CARDIOVASCULAR: Normal heart rate noted, regular rhythm, no murmur. RESPIRATORY: Clear to auscultation bilaterally. Effort and breath sounds normal, no problems with respiration noted. BREASTS: Symmetric in size. No masses, skin changes, nipple drainage, or lymphadenopathy. ABDOMEN: Soft, normal bowel sounds, no distention noted.  No tenderness, rebound or guarding.  BLADDER: Normal PELVIC:  External Genitalia: Hyperemia of the labia; clitoris contains several vesicles, crusted, punctate, tender to palpation, possibly consistent with resolving HSV; culture taken  BUS: Normal  Vagina: Atrophic  Cervix: Surgically absent  Uterus: Surgically absent  Adnexa: Normal; nonpalpable nontender  RV: External Exam NormaI, No Rectal Masses and Normal Sphincter tone  MUSCULOSKELETAL: Normal range of motion. No tenderness.  No cyanosis, clubbing, or edema.  2+ distal pulses. LYMPHATIC: No Axillary, Supraclavicular, or Inguinal Adenopathy.    Assessment:   Annual gynecologic examination 60 y.o. Contraception: status post hysterectomy [id number] Healing vesicles on underside of clitoris, cannot rule out possible herpes infection  Plan:  Pap: Not needed Mammogram: thru brynett Stool Guaiac Testing:  Ordered Labs: thru pcp Routine preventative health maintenance measures emphasized: Exercise/Diet/Weight control, Tobacco  Warnings, Alcohol/Substance use risks and Stress Management Acyclovir 400 mg twice a day for 5 days HSV culture of clitoris lesion Return to [place], CMA  Brayton Mars, MD  Note: This dictation was prepared with Dragon dictation along with smaller phrase technology. Any transcriptional errors that result from this process are unintentional.

## 2015-07-21 NOTE — Patient Instructions (Signed)
1. Pap smear is performed. 2. Obtain mammograms per Dr. Tollie Pizza and [facility] radiology protocol 3. Stool guaiac cards are given for colon cancer screening 4. Acyclovir 1 tablet twice a day for 5 days is written for vulvar symptoms 5. Genital culture is obtained for HSV 1/HSV-2 6. Partner may be checked for exposure to HSV 1/HSV-2 by obtaining:  IgG for HSV 1 and HSV-2  IgM for HSV 1 and HSV-2 7. Urine culture is obtained for UTI symptoms 8. Return in 1 year

## 2015-07-22 ENCOUNTER — Encounter: Payer: Self-pay | Admitting: *Deleted

## 2015-07-22 DIAGNOSIS — R300 Dysuria: Secondary | ICD-10-CM | POA: Diagnosis not present

## 2015-07-24 LAB — URINE CULTURE

## 2015-07-25 LAB — HERPES SIMPLEX VIRUS CULTURE

## 2015-07-26 LAB — PAP IG AND HPV HIGH-RISK
HPV, high-risk: NEGATIVE
PAP SMEAR COMMENT: 0

## 2015-08-05 ENCOUNTER — Other Ambulatory Visit: Payer: Self-pay | Admitting: Internal Medicine

## 2015-08-05 NOTE — Telephone Encounter (Signed)
Refill request for Klonopin, last seen [id number], last filled [phone].  Please advise.

## 2015-08-11 ENCOUNTER — Encounter: Payer: Self-pay | Admitting: Internal Medicine

## 2015-08-11 ENCOUNTER — Ambulatory Visit (INDEPENDENT_AMBULATORY_CARE_PROVIDER_SITE_OTHER): Payer: BLUE CROSS/BLUE SHIELD | Admitting: Internal Medicine

## 2015-08-11 ENCOUNTER — Ambulatory Visit (INDEPENDENT_AMBULATORY_CARE_PROVIDER_SITE_OTHER): Payer: BLUE CROSS/BLUE SHIELD

## 2015-08-11 VITALS — BP 140/80 | HR 107 | Ht 61.0 in | Wt 153.6 lb

## 2015-08-11 DIAGNOSIS — F32A Depression, unspecified: Secondary | ICD-10-CM

## 2015-08-11 DIAGNOSIS — M545 Low back pain, unspecified: Secondary | ICD-10-CM | POA: Insufficient documentation

## 2015-08-11 DIAGNOSIS — M25551 Pain in right hip: Secondary | ICD-10-CM

## 2015-08-11 DIAGNOSIS — M8580 Other specified disorders of bone density and structure, unspecified site: Secondary | ICD-10-CM

## 2015-08-11 DIAGNOSIS — I10 Essential (primary) hypertension: Secondary | ICD-10-CM | POA: Diagnosis not present

## 2015-08-11 DIAGNOSIS — M25572 Pain in left ankle and joints of left foot: Secondary | ICD-10-CM

## 2015-08-11 DIAGNOSIS — S82892S Other fracture of left lower leg, sequela: Secondary | ICD-10-CM | POA: Diagnosis not present

## 2015-08-11 DIAGNOSIS — E039 Hypothyroidism, unspecified: Secondary | ICD-10-CM | POA: Diagnosis not present

## 2015-08-11 DIAGNOSIS — M533 Sacrococcygeal disorders, not elsewhere classified: Secondary | ICD-10-CM | POA: Diagnosis not present

## 2015-08-11 DIAGNOSIS — S82892A Other fracture of left lower leg, initial encounter for closed fracture: Secondary | ICD-10-CM

## 2015-08-11 DIAGNOSIS — F329 Major depressive disorder, single episode, unspecified: Secondary | ICD-10-CM

## 2015-08-11 LAB — LIPID PANEL
CHOLESTEROL: 197 mg/dL (ref 0–200)
HDL: 61.5 mg/dL (ref >39.00)
LDL Cholesterol: 106 mg/dL — ABNORMAL HIGH (ref 0–99)
NonHDL: 135.38
Total CHOL/HDL Ratio: 3
Triglycerides: 148 mg/dL (ref 0.0–149.0)
VLDL: 29.6 mg/dL (ref 0.0–40.0)

## 2015-08-11 LAB — COMPREHENSIVE METABOLIC PANEL
ALBUMIN: 4.5 g/dL (ref 3.5–5.2)
ALK PHOS: 76 U/L (ref 39–117)
ALT: 26 U/L (ref 0–35)
AST: 16 U/L (ref 0–37)
BUN: 32 mg/dL — AB (ref 6–23)
CO2: 25 mEq/L (ref 19–32)
Calcium: 9.6 mg/dL (ref 8.4–10.5)
Chloride: 106 mEq/L (ref 96–112)
Creatinine, Ser: 0.93 mg/dL (ref 0.40–1.20)
GFR: 65.6 mL/min (ref >60.00)
Glucose, Bld: 101 mg/dL — ABNORMAL HIGH (ref 70–99)
POTASSIUM: 4.3 meq/L (ref 3.5–5.1)
Sodium: 140 mEq/L (ref 135–145)
TOTAL PROTEIN: 7.3 g/dL (ref 6.0–8.3)
Total Bilirubin: 0.6 mg/dL (ref 0.2–1.2)

## 2015-08-11 LAB — VITAMIN D 25 HYDROXY (VIT D DEFICIENCY, FRACTURES): VITD: 18.96 ng/mL — AB (ref 30.00–100.00)

## 2015-08-11 LAB — TSH: TSH: 10.55 u[IU]/mL — ABNORMAL HIGH (ref 0.35–4.50)

## 2015-08-11 MED ORDER — ESCITALOPRAM OXALATE 20 MG PO TABS: 20.0000 mg | ORAL_TABLET | Freq: Every day | ORAL | Status: DC

## 2015-08-11 MED ORDER — CLONAZEPAM 1 MG PO TABS: 1.0000 mg | ORAL_TABLET | Freq: Three times a day (TID) | ORAL | Status: DC

## 2015-08-11 MED ORDER — ATORVASTATIN CALCIUM 40 MG PO TABS
40.0000 mg | ORAL_TABLET | Freq: Every day | ORAL | Status: DC
Start: 2015-08-11 — End: 2016-10-03

## 2015-08-11 MED ORDER — AMLODIPINE BESYLATE 5 MG PO TABS: 5.0000 mg | ORAL_TABLET | Freq: Every day | ORAL | Status: DC

## 2015-08-11 MED ORDER — LEVOTHYROXINE SODIUM 150 MCG PO TABS: ORAL_TABLET | ORAL | Status: DC

## 2015-08-11 MED ORDER — ZOLPIDEM TARTRATE 10 MG PO TABS: 10.0000 mg | ORAL_TABLET | Freq: Every evening | ORAL | Status: DC | PRN

## 2015-08-11 MED ORDER — LOSARTAN POTASSIUM 100 MG PO TABS: 100.0000 mg | ORAL_TABLET | Freq: Every day | ORAL | Status: DC

## 2015-08-11 NOTE — Patient Instructions (Addendum)
We will set up evaluation with Dr. Debby Bud at [facility] regarding your ankle.  Xray of right hip and lower back today.  Start back on Lexapro at 10mg  or half tablet daily for 1 week, then increase to 20mg  daily.

## 2015-08-11 NOTE — Progress Notes (Signed)
Subjective:    Patient ID: Christie Ramirez, female    DOB: [birth year], 60 y.o.   MRN: [id number]  HPI  60YO female presents for follow up.  Left ankle pain - Slipped in snow this past winter. Second break in 2 years. Has pain with rotation of foot now. Has not had any surgical intervention. Seen by Dr. Sabra Heck, who recommended brace for 3 months. Having swelling in ankle mostly at night and afternoon. Not taking anything for pain. Hears cracking sounds in ankles at night.  Fell backward at work last Wednesday, landing on tailbone and books landing on abdomen. Continues to have pain in tailbone.  Also having pain in right hip for about 3 months. No known injury. Not taking anything for pain. Aching pain. Does not radiate.  She has been off Lexapro. Notes more symptoms of depressed mood recently Would like to restart medication.   Wt Readings from Last 3 Encounters:  08/11/15 153 lb 9.6 oz (69.673 kg)  07/21/15 154 lb 11.2 oz (70.171 kg)  02/22/15 144 lb (65.318 kg)   BP Readings from Last 3 Encounters:  08/11/15 140/80  07/21/15 145/86  02/22/15 124/72    Past Medical History  Diagnosis Date  . Hyperlipidemia   . Hypothyroidism   . Anxiety   . Endometriosis 2009  . Cancer ([place]) 1995    thyroid  . Breast cancer of upper-inner quadrant of right female breast ([place]) 05/01/2013    Tubular carcinoma, T1b,N0,M0; ER/ PR 90%, her 2 neu not overexpressed.   . Osteopenia     rt hip   Family History  Problem Relation Age of Onset  . Colon polyps Father     age 52  . Heart disease Father   . Heart disease Mother   . Heart disease Paternal Grandmother   . Heart disease Paternal Grandfather   . Cancer Neg Hx   . Diabetes Neg Hx    Past Surgical History  Procedure Laterality Date  . Colectomy  2009    secondary to endometriosis DR. Smith  . Colonoscopy  2009    Dr. Tamala Julian  . Foot surgery  right  . Abdominal hysterectomy  2005    Martin DeFrancesco,MD  . Breast biopsy  Right 04-02-13    INVASIVE MAMMARY CARCINOMA WITH FEATURES OF TUBULAR CARCINOMA,  . Breast surgery Right 05/01/13    wide excision  . 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 No malignancy.   Social History   Social History  . Marital Status: Single    Spouse Name: N/A  . Number of Children: N/A  . Years of Education: N/A   Social History Main Topics  . Smoking status: Never Smoker   . Smokeless tobacco: Never Used  . Alcohol Use: 0.0 oz/week    0 Standard drinks or equivalent per week     Comment: wine- daily  . Drug Use: No  . Sexual Activity: Yes    Birth Control/ Protection: Surgical   Other Topics Concern  . None   Social History Narrative    Review of Systems  Constitutional: Negative for fever, chills, appetite change, fatigue and unexpected weight change.  Eyes: Negative for visual disturbance.  Respiratory: Negative for cough and shortness of breath.   Cardiovascular: Negative for chest pain, palpitations and leg swelling.  Gastrointestinal: Negative for nausea, vomiting, abdominal pain, diarrhea and constipation.  Musculoskeletal: Positive for myalgias, back pain and arthralgias.  Skin: Positive for  color change. Negative for rash.  Neurological: Negative for weakness and headaches.  Hematological: Negative for adenopathy. Does not bruise/bleed easily.  Psychiatric/Behavioral: Negative for suicidal ideas, sleep disturbance and dysphoric mood. The patient is not nervous/anxious.        Objective:    BP 140/80 mmHg  Pulse 107  Ht 5\' 1"  (1.549 m)  Wt 153 lb 9.6 oz (69.673 kg)  BMI 29.04 kg/m2  SpO2 97% Physical Exam  Constitutional: She is oriented to person, place, and time. She appears well-developed and well-nourished. No distress.  HENT:  Head: Normocephalic and atraumatic.  Right Ear: External ear normal.  Left Ear: External ear normal.  Nose: Nose normal.  Mouth/Throat: Oropharynx is clear and  moist. No oropharyngeal exudate.  Eyes: Conjunctivae are normal. Pupils are equal, round, and reactive to light. Right eye exhibits no discharge. Left eye exhibits no discharge. No scleral icterus.  Neck: Normal range of motion. Neck supple. No tracheal deviation present. No thyromegaly present.  Cardiovascular: Normal rate, regular rhythm, normal heart sounds and intact distal pulses.  Exam reveals no gallop and no friction rub.   No murmur heard. Pulmonary/Chest: Effort normal and breath sounds normal. No respiratory distress. She has no wheezes. She has no rales. She exhibits no tenderness.  Musculoskeletal: Normal range of motion. She exhibits no edema.       Right hip: She exhibits tenderness and bony tenderness. She exhibits normal range of motion, normal strength and no deformity.       Lumbar back: She exhibits tenderness, bony tenderness and pain. She exhibits no edema.  Lymphadenopathy:    She has no cervical adenopathy.  Neurological: She is alert and oriented to person, place, and time. No cranial nerve deficit. She exhibits normal muscle tone. Coordination normal.  Skin: Skin is warm and dry. Ecchymosis noted. No rash noted. She is not diaphoretic. No erythema. No pallor.     Psychiatric: She has a normal mood and affect. Her behavior is normal. Judgment and thought content normal.          Assessment & Plan:   Problem List Items Addressed This Visit      Unprioritized   Ankle fracture, left - Primary    Left ankle fracture x 2. Now with residual pain. Will set up second opinion with [facility]. She is not interested in taking medication to help with pain.      Relevant Orders   Ambulatory referral to Orthopedic Surgery   Depression    Worsening symptoms of depression off Lexapro. Will restart Lexapro at 10mg  daily x 1 week, then increase to 20mg  daily. Follow up in 3 months and prn.      Relevant Medications   escitalopram (LEXAPRO) 20 MG tablet   Hypertension (Chronic)     BP Readings from Last 3 Encounters:  08/11/15 140/80  07/21/15 145/86  02/22/15 124/72   BP well controlled generally. Renal function with labs. Continue Losartan and Amlodipine.      Relevant Medications   losartan (COZAAR) 100 MG tablet   atorvastatin (LIPITOR) 40 MG tablet   amLODipine (NORVASC) 5 MG tablet   Other Relevant Orders   Comprehensive metabolic panel   Lipid panel   Hypothyroidism    Will check TSH with labs. Continue Levothyroxine.      Relevant Orders   TSH   Low back pain    Back pain after fall over lower sacrum. Will get plain xray for evaluation.      Relevant Orders  DG Sacrum/Coccyx   Osteopenia    Reviewed previous bone density from 2015 which showed osteopenia. Will repeat bone density. Vit D with labs.      Relevant Orders   VITAMIN D 25 Hydroxy (Vit-D Deficiency, Fractures)   DG Bone Density   Right hip pain    Right hip pain after fall. Will get plain xray today.      Relevant Orders   DG HIP UNILAT WITH PELVIS 2-3 VIEWS RIGHT       Return in about 3 months (around 11/11/2015) for New Patient.  Ronette Deter, MD Internal Medicine [facility]

## 2015-08-11 NOTE — Assessment & Plan Note (Signed)
Will check TSH with labs. Continue Levothyroxine. 

## 2015-08-11 NOTE — Assessment & Plan Note (Signed)
Back pain after fall over lower sacrum. Will get plain xray for evaluation.

## 2015-08-11 NOTE — Addendum Note (Signed)
Addended byElpidio Galea T on: 08/11/2015 04:52 PM   Modules accepted: Orders

## 2015-08-11 NOTE — Assessment & Plan Note (Signed)
Left ankle fracture x 2. Now with residual pain. Will set up second opinion with [facility]. She is not interested in taking medication to help with pain.

## 2015-08-11 NOTE — Assessment & Plan Note (Signed)
BP Readings from Last 3 Encounters:  08/11/15 140/80  07/21/15 145/86  02/22/15 124/72   BP well controlled generally. Renal function with labs. Continue Losartan and Amlodipine.

## 2015-08-11 NOTE — Assessment & Plan Note (Signed)
Worsening symptoms of depression off Lexapro. Will restart Lexapro at 10mg  daily x 1 week, then increase to 20mg  daily. Follow up in 3 months and prn.

## 2015-08-11 NOTE — Progress Notes (Signed)
Pre visit review using our clinic review tool, if applicable. No additional management support is needed unless otherwise documented below in the visit note. 

## 2015-08-11 NOTE — Assessment & Plan Note (Signed)
Right hip pain after fall. Will get plain xray today.

## 2015-08-11 NOTE — Assessment & Plan Note (Signed)
Reviewed previous bone density from 2015 which showed osteopenia. Will repeat bone density. Vit D with labs.

## 2015-08-15 ENCOUNTER — Other Ambulatory Visit: Payer: Self-pay | Admitting: Internal Medicine

## 2015-08-19 ENCOUNTER — Emergency Department
Admission: EM | Admit: 2015-08-19 | Discharge: 2015-08-19 | Disposition: A | Discharge status: Home / Self Care | Payer: BLUE CROSS/BLUE SHIELD | Attending: Emergency Medicine | Admitting: Emergency Medicine

## 2015-08-19 ENCOUNTER — Ambulatory Visit (INDEPENDENT_AMBULATORY_CARE_PROVIDER_SITE_OTHER): Payer: BLUE CROSS/BLUE SHIELD | Admitting: Internal Medicine

## 2015-08-19 ENCOUNTER — Encounter: Payer: Self-pay | Admitting: Internal Medicine

## 2015-08-19 DIAGNOSIS — Z7982 Long term (current) use of aspirin: Secondary | ICD-10-CM | POA: Diagnosis not present

## 2015-08-19 DIAGNOSIS — K130 Diseases of lips: Secondary | ICD-10-CM

## 2015-08-19 DIAGNOSIS — T7840XA Allergy, unspecified, initial encounter: Secondary | ICD-10-CM | POA: Diagnosis present

## 2015-08-19 DIAGNOSIS — E039 Hypothyroidism, unspecified: Secondary | ICD-10-CM | POA: Diagnosis not present

## 2015-08-19 DIAGNOSIS — Z853 Personal history of malignant neoplasm of breast: Secondary | ICD-10-CM | POA: Insufficient documentation

## 2015-08-19 DIAGNOSIS — K047 Periapical abscess without sinus: Secondary | ICD-10-CM | POA: Diagnosis not present

## 2015-08-19 DIAGNOSIS — R220 Localized swelling, mass and lump, head: Secondary | ICD-10-CM

## 2015-08-19 MED ORDER — CLINDAMYCIN HCL 150 MG PO CAPS: 450.0000 mg | ORAL_CAPSULE | Freq: Three times a day (TID) | ORAL | 0 refills | Status: AC

## 2015-08-19 MED ORDER — FAMOTIDINE IN NACL 20-0.9 MG/50ML-% IV SOLN
20.0000 mg | Freq: Once | INTRAVENOUS | Status: AC
  Administered 2015-08-19: 20 mg via INTRAVENOUS
  Filled 2015-08-19: qty 50

## 2015-08-19 MED ORDER — CLINDAMYCIN HCL 150 MG PO CAPS
450.0000 mg | ORAL_CAPSULE | Freq: Once | ORAL | Status: AC
  Administered 2015-08-19: 450 mg via ORAL
  Filled 2015-08-19: qty 3

## 2015-08-19 MED ORDER — DEXAMETHASONE SODIUM PHOSPHATE 10 MG/ML IJ SOLN
10.0000 mg | Freq: Once | INTRAMUSCULAR | Status: AC
  Administered 2015-08-19: 10 mg via INTRAVENOUS
  Filled 2015-08-19: qty 1

## 2015-08-19 NOTE — ED Notes (Signed)
Pt sleeping soundly.

## 2015-08-19 NOTE — Progress Notes (Signed)
Subjective:    Patient ID: Christie Ramirez, female    DOB: 09-19-1955, 60 y.o.   MRN: [id number]  HPI  60YO female presents as acute walk-in visit.  She reports waking in the night with swelling in her left lower lip. No dyspnea, wheezing. No trouble swallowing. Ate chicken and veggies for dinner with wine. No other new exposures. She took her dog on a 0.49mile walk with no issues. She was worried she was having a stroke, and so came as a walk-in to our office.  Denies any current dyspnea or trouble swallowing. No known trauma to face. No known dental lesions. She did not take anything for her symptoms.  Wt Readings from Last 3 Encounters:  08/11/15 153 lb 9.6 oz (69.7 kg)  07/21/15 154 lb 11.2 oz (70.2 kg)  02/22/15 144 lb (65.3 kg)   BP Readings from Last 3 Encounters:  08/19/15 130/80  08/11/15 140/80  07/21/15 (!) 145/86    Past Medical History:  Diagnosis Date  . Anxiety   . Breast cancer of upper-inner quadrant of right female breast ([place]) 05/01/2013   Tubular carcinoma, T1b,N0,M0; ER/ PR 90%, her 2 neu not overexpressed.   . Cancer ([place]) 1995   thyroid  . Endometriosis 2009  . Hyperlipidemia   . Hypothyroidism   . Osteopenia    rt hip   Family History  Problem Relation Age of Onset  . Colon polyps Father     age 21  . Heart disease Father   . Heart disease Mother   . Heart disease Paternal Grandmother   . Heart disease Paternal Grandfather   . Cancer Neg Hx   . Diabetes Neg Hx    Past Surgical History:  Procedure Laterality Date  . ABDOMINAL HYSTERECTOMY  2005   Martin DeFrancesco,MD  . BREAST BIOPSY Right 04-02-13   INVASIVE MAMMARY CARCINOMA WITH FEATURES OF TUBULAR CARCINOMA,  . BREAST SURGERY Right 05/01/13   wide excision  . colectomy  2009   secondary to endometriosis DR. Smith  . 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 No malignancy.  . COLONOSCOPY  2009   Dr. Tamala Julian  . FOOT SURGERY  right     Social History   Social History  . Marital status: Single    Spouse name: N/A  . Number of children: N/A  . Years of education: N/A   Social History Main Topics  . Smoking status: Never Smoker  . Smokeless tobacco: Never Used  . Alcohol use 0.0 oz/week     Comment: wine- daily  . Drug use: No  . Sexual activity: Yes    Birth control/ protection: Surgical   Other Topics Concern  . None   Social History Narrative  . None    Review of Systems  Constitutional: Negative for appetite change, chills, fatigue, fever and unexpected weight change.  HENT: Positive for facial swelling. Negative for congestion, ear pain, sinus pressure, sneezing, sore throat, trouble swallowing and voice change.   Eyes: Negative for visual disturbance.  Respiratory: Negative for cough, chest tightness, shortness of breath and wheezing.   Cardiovascular: Negative for chest pain and leg swelling.  Gastrointestinal: Negative for abdominal pain.  Skin: Negative for color change and rash.  Neurological: Positive for numbness (over swollen area on face) and headaches. Negative for dizziness, tremors, syncope, speech difficulty, weakness and light-headedness.  Hematological: Negative for adenopathy. Does not bruise/bleed easily.  Psychiatric/Behavioral: Negative for dysphoric mood. The patient  is nervous/anxious.        Objective:    BP 130/80 (BP Location: Left Arm, Patient Position: Sitting, Cuff Size: Large)   Pulse 87   Temp 97.5 F (36.4 C)   SpO2 98%  Physical Exam  Constitutional: She is oriented to person, place, and time. She appears well-developed and well-nourished. No distress.  HENT:  Head: Normocephalic and atraumatic.    Right Ear: External ear normal.  Left Ear: External ear normal.  Nose: Nose normal.  Mouth/Throat: Oropharynx is clear and moist.  Eyes: Conjunctivae are normal. Pupils are equal, round, and reactive to light. Right eye exhibits no discharge. Left eye exhibits no  discharge. No scleral icterus.  Neck: Normal range of motion. Neck supple. No tracheal deviation present. No thyromegaly present.  Cardiovascular: Normal rate, regular rhythm, normal heart sounds and intact distal pulses.  Exam reveals no gallop and no friction rub.   No murmur heard. Pulmonary/Chest: Effort normal and breath sounds normal. No respiratory distress. She has no wheezes. She has no rales. She exhibits no tenderness.  Musculoskeletal: Normal range of motion. She exhibits no edema or tenderness.  Lymphadenopathy:    She has no cervical adenopathy.  Neurological: She is alert and oriented to person, place, and time. She has normal strength. No cranial nerve deficit or sensory deficit. She exhibits normal muscle tone. Coordination and gait normal.  Skin: Skin is warm and dry. No rash noted. She is not diaphoretic. No erythema. No pallor.  Psychiatric: She has a normal mood and affect. Her behavior is normal. Judgment and thought content normal.          Assessment & Plan:   Problem List Items Addressed This Visit      Unprioritized   Lip swelling    Left lower lip swelling most c/w allergic reaction. No uvular edema noted. No dyspnea or wheezing. Pt was given Benadryl 50mg  po x1 here in clinic. Will send to ER for further evaluation, IV steroids for potential allergic reaction.        Other Visit Diagnoses   None.      No Follow-up on file.  Ronette Deter, MD [facility]

## 2015-08-19 NOTE — Progress Notes (Signed)
Pre visit review using our clinic review tool, if applicable. No additional management support is needed unless otherwise documented below in the visit note. 

## 2015-08-19 NOTE — Assessment & Plan Note (Signed)
Left lower lip swelling most c/w allergic reaction. No uvular edema noted. No dyspnea or wheezing. Pt was given Benadryl 50mg  po x1 here in clinic. Will send to ER for further evaluation, IV steroids for potential allergic reaction.

## 2015-08-19 NOTE — ED Provider Notes (Signed)
[facility] Emergency Department Provider Note   ____________________________________________   First MD Initiated Contact with Patient 08/19/15 [phone]     (approximate)  I have reviewed the triage vital signs and the nursing notes.   HISTORY  Chief Complaint Allergic Reaction and Angioedema   HPI Christie Ramirez is a 60 y.o. female with a history of hyperlipidemia as well as hypertension who is presenting to the emergency department with left-sided facial swelling and pain that she noticed at 1 AM. She says that the swelling and pain have not worsened since 1 AM. She was seen at an outpatient clinic and then sent to the emergency department for further evaluation. In triage it was a concern for lisinopril-induced angioedema. She did take Benadryl prior to arrival.Denies any difficulty breathing or feeling that her tongue swelling.   Past Medical History:  Diagnosis Date  . Anxiety   . Breast cancer of upper-inner quadrant of right female breast ([place]) 05/01/2013   Tubular carcinoma, T1b,N0,M0; ER/ PR 90%, her 2 neu not overexpressed.   . Cancer ([place]) 1995   thyroid  . Endometriosis 2009  . Hyperlipidemia   . Hypothyroidism   . Osteopenia    rt hip    Patient Active Problem List   Diagnosis Date Noted  . Lip swelling 08/19/2015  . Low back pain 08/11/2015  . Right hip pain 08/11/2015  . Osteopenia 08/11/2015  . Ankle fracture, left 02/07/2015  . Chronic bronchitis ([place]) 01/29/2015  . History of herpes genitalis 09/23/2014  . Endometriosis 09/23/2014  . Piriformis syndrome of left side 07/17/2014  . Obstructive sleep apnea 09/19/2013  . Colon cancer screening 07/28/2013  . Insomnia 06/09/2013  . Breast cancer of upper-inner quadrant of right female breast ([place]) 04/08/2013  . Routine general medical examination at a health care facility 02/18/2013  . Elevated liver function tests 11/18/2012  . Screening for breast cancer 11/18/2012  .  Hyperlipidemia LDL goal < 100 06/26/2011  . Hypertension 05/18/2011  . Anxiety 05/18/2011  . Hypothyroidism 10/12/2010  . Depression 10/12/2010    Past Surgical History:  Procedure Laterality Date  . ABDOMINAL HYSTERECTOMY  2005   Martin DeFrancesco,MD  . BREAST BIOPSY Right 04-02-13   INVASIVE MAMMARY CARCINOMA WITH FEATURES OF TUBULAR CARCINOMA,  . BREAST SURGERY Right 05/01/13   wide excision  . colectomy  2009   secondary to endometriosis DR. Smith  . 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 No malignancy.  . COLONOSCOPY  2009   Dr. Tamala Julian  . FOOT SURGERY  right    Prior to Admission medications   Medication Sig Start Date End Date Taking? Authorizing Provider  acyclovir (ZOVIRAX) 400 MG tablet Twice daily for 5 days 07/21/15   Alanda Slim Defrancesco, MD  amLODipine (NORVASC) 5 MG tablet Take 1 tablet (5 mg total) by mouth daily. 08/11/15   Jackolyn Confer, MD  aspirin 81 MG tablet Take 81 mg by mouth daily.    Historical Provider, MD  atorvastatin (LIPITOR) 40 MG tablet Take 1 tablet (40 mg total) by mouth daily. 08/11/15   Jackolyn Confer, MD  CALCIUM-MAGNESIUM-ZINC PO Take by mouth 3 (three) times daily.    Historical Provider, MD  Cholecalciferol (VITAMIN D-3 PO) Take 1,000 Units by mouth.    Historical Provider, MD  clonazePAM (KLONOPIN) 1 MG tablet Take 1 tablet (1 mg total) by mouth 3 (three) times daily. 08/11/15   Jackolyn Confer, MD  escitalopram (LEXAPRO) 20 MG tablet Take 1 tablet (20 mg total) by mouth daily. 08/11/15   Jackolyn Confer, MD  GARLIC PO Take 0000000 mg by mouth.    Historical Provider, MD  levothyroxine (SYNTHROID, LEVOTHROID) 150 MCG tablet TAKE 1 TABLET (125 MCG TOTAL) BY MOUTH DAILY. 08/11/15   Jackolyn Confer, MD  losartan (COZAAR) 100 MG tablet Take 1 tablet (100 mg total) by mouth daily. 08/11/15   Jackolyn Confer, MD  Multiple Vitamins-Minerals (MULTIVITAMIN WITH MINERALS) tablet Take 1 tablet  by mouth daily.    Historical Provider, MD  vitamin C (ASCORBIC ACID) 500 MG tablet Take 500 mg by mouth daily.    Historical Provider, MD  zolpidem (AMBIEN) 10 MG tablet TAKE 1 TABLET BY MOUTH AT BEDTIME AS NEEDED 08/16/15   Jackolyn Confer, MD    Allergies Codeine; Ace inhibitors; Iodinated diagnostic agents; and Penicillins  Family History  Problem Relation Age of Onset  . Colon polyps Father     age 60  . Heart disease Father   . Heart disease Mother   . Heart disease Paternal Grandmother   . Heart disease Paternal Grandfather   . Cancer Neg Hx   . Diabetes Neg Hx     Social History Social History  Substance Use Topics  . Smoking status: Never Smoker  . Smokeless tobacco: Never Used  . Alcohol use 0.0 oz/week     Comment: wine- daily    Review of Systems Constitutional: No fever/chills Eyes: No visual changes. ENT: No sore throat. Cardiovascular: Denies chest pain. Respiratory: Denies shortness of breath. Gastrointestinal: No abdominal pain.  No nausea, no vomiting.  No diarrhea.  No constipation. Genitourinary: Negative for dysuria. Musculoskeletal: Negative for back pain. Skin: Negative for rash. Neurological: Negative for headaches, focal weakness or numbness.  10-point ROS otherwise negative.  ____________________________________________   PHYSICAL EXAM:  VITAL SIGNS: ED Triage Vitals [08/19/15 0919]  Enc Vitals Group     BP (!) 148/93     Pulse Rate 86     Resp 17     Temp 97.6 F (36.4 C)     Temp Source Oral     SpO2 96 %     Weight 151 lb (68.5 kg)     Height 5\' 1"  (1.549 m)     Head Circumference      Peak Flow      Pain Score      Pain Loc      Pain Edu?      Excl. in [place]?     Constitutional: Alert and oriented. Well appearing and in no acute distress. Eyes: Conjunctivae are normal. PERRL. EOMI. Head: Atraumatic. Nose: No congestion/rhinnorhea. Mouth/Throat: Mucous membranes are moist.  No tongue swelling or uvular swelling. Voice  is normal and the patient is controlling her secretions. She has a moderate amount of tartar buildup throughout. There is no brawny edema to the floor the mouth. There is swelling to the left side of the lower lip which is mild. There is tenderness over the mandible anteriorly as well as to the mid mandible on the left. No fluctuance.  Neck: No stridor.   Cardiovascular: Normal rate, regular rhythm. Grossly normal heart sounds.   Respiratory: Normal respiratory effort.  No retractions. Lungs CTAB. Gastrointestinal: Soft and nontender. No distention.  Musculoskeletal: No lower extremity tenderness nor edema.  No joint effusions. Neurologic:  Normal speech and language. No gross focal neurologic deficits are appreciated. No gait instability. Skin:  Skin  is warm, dry and intact. No rash noted. Psychiatric: Mood and affect are normal. Speech and behavior are normal.  ____________________________________________   LABS (all labs ordered are listed, but only abnormal results are displayed)  Labs Reviewed - No data to display ____________________________________________  EKG   ____________________________________________  RADIOLOGY   ____________________________________________   PROCEDURES   Procedures  ____________________________________________   INITIAL IMPRESSION / ASSESSMENT AND PLAN / ED COURSE  Pertinent labs & imaging results that were available during my care of the patient were reviewed by me and considered in my medical decision making (see chart for details).  Clinical presentation most favors dental abscess. However, given lisinopril and lip swelling we will observe for 4 hours. I do believe that the lip is most likely from a localized inflammation.  Clinical Course     ----------------------------------------- 12:30 PM on 08/19/2015 -----------------------------------------  Patient with mild reduction in the amount of swelling. Still without any tongue  swelling. No airway compromise. I will discharge her with clindamycin for likely dental infection. However, we discussed holding her lisinopril. She will continue to hold her lisinopril until she follows up with her dentist as well as her primary care doctor. She will be discharged home. She understands plan and is willing to comply. ____________________________________________   FINAL CLINICAL IMPRESSION(S) / ED DIAGNOSES  Dental abscess.    NEW MEDICATIONS STARTED DURING THIS VISIT:  New Prescriptions   No medications on file     Note:  This document was prepared using Dragon voice recognition software and may include unintentional dictation errors.    Orbie Pyo, MD 08/19/15 [phone]

## 2015-08-19 NOTE — ED Triage Notes (Signed)
Pt present to ED with reports of edema to the left side of her mouth and face that she woke up with at approx 0100 today  2/10 pain reported to the site  Pt takes lisinopril

## 2015-08-19 NOTE — Patient Instructions (Signed)
To ER for further evaluation. 

## 2015-08-20 ENCOUNTER — Ambulatory Visit (INDEPENDENT_AMBULATORY_CARE_PROVIDER_SITE_OTHER): Payer: BLUE CROSS/BLUE SHIELD | Admitting: Family Medicine

## 2015-08-20 DIAGNOSIS — K130 Diseases of lips: Secondary | ICD-10-CM | POA: Diagnosis not present

## 2015-08-20 DIAGNOSIS — R220 Localized swelling, mass and lump, head: Secondary | ICD-10-CM

## 2015-08-20 DIAGNOSIS — R51 Headache: Secondary | ICD-10-CM

## 2015-08-20 DIAGNOSIS — G44209 Tension-type headache, unspecified, not intractable: Secondary | ICD-10-CM | POA: Diagnosis not present

## 2015-08-20 DIAGNOSIS — R519 Headache, unspecified: Secondary | ICD-10-CM | POA: Insufficient documentation

## 2015-08-20 NOTE — Progress Notes (Signed)
[name], MD Phone: [phone]  Christie Ramirez is a 60 y.o. female who presents today for follow-up.  The patient was seen yesterday in the office by her former PCP and noted to have left lower lip swelling and left-sided face swelling. She was given Benadryl and sent to the emergency room for evaluation. The ED physician felt as though this was related to a dental abscess. Patient notes the triage nurse felt as though this was related to her lisinopril. She has no swelling in her lips or face at this time. She was placed on clindamycin and has been taking that. She has an appointment with her dentist on Monday. She notes irritation of her left lip with this though no other tongue mouth or throat involvement. Notes this area of irritation is only been there since yesterday. No shortness of breath. Additionally today patient notes she fell just prior to her physical 2 weeks ago. Fell back and hit her buttocks on the floor in the back of her head on a cart of [employer]. Did not pass out. She does not remember hitting her head though her colleagues said she did. Patient reports she had x-rays of her sacrum following this. Has had intermittent headaches since then. Headaches have been improving over the last week. They're dull and in the frontal portion of her head and the occipital portion of her head. She has a history of headaches in the past though not consistently. No numbness or weakness. She notes some vision changes though these have been more chronic for which she needs to see her eye doctor for. No vision changes at this time. No sudden onset worst headache of life.  ROS see history of present illness  Objective  Physical Exam Vitals:   08/20/15 0907  BP: 128/86  Pulse: 100  Temp: 98.4 F (36.9 C)    BP Readings from Last 3 Encounters:  08/20/15 128/86  08/19/15 123/80  08/19/15 130/80   Wt Readings from Last 3 Encounters:  08/20/15 153 lb (69.4 kg)  08/19/15 151 lb  (68.5 kg)  08/11/15 153 lb 9.6 oz (69.7 kg)    Physical Exam  Constitutional: No distress.  HENT:  Head: Normocephalic and atraumatic.  Right Ear: External ear normal.  Left Ear: External ear normal.  Mouth/Throat: No oropharyngeal exudate.  No obvious scalp lesions or tenderness, left in her lip oral mucosa upper and lower with 2 areas of white plaque that are nontender, no obvious gum swelling or facial swelling today  Eyes: Conjunctivae are normal. Pupils are equal, round, and reactive to light.  Cardiovascular: Normal rate, regular rhythm and normal heart sounds.   Pulmonary/Chest: Effort normal and breath sounds normal.  Musculoskeletal: She exhibits no edema.  Neurological: She is alert. Gait normal.  CN 2-12 intact, 5/5 strength in bilateral biceps, triceps, grip, quads, hamstrings, plantar and dorsiflexion, sensation to light touch intact in bilateral UE and LE, normal gait, 2+ patellar reflexes  Skin: Skin is warm and dry. She is not diaphoretic.     Assessment/Plan: Please see individual problem list.  Lip swelling Resolved. No obvious gum or facial swelling today. No lip swelling today. She does have white plaque on the inner aspect of her lips and oral mucosa. Unsure what this is. No other mouth involvement. Doubt this is angioedema given involvement of just part of her lip. We will have her follow-up with her dentist on Monday as planned. If no evidence of dental abscess we will need to keep  her off of lisinopril. She will complete the clindamycin. Encouraged a probiotic while on clindamycin. She was given return precautions.  Headache Patient with intermittent improving headaches since falling and potentially hitting the back of her head. She is neurologically intact today. Has been improving. Suspect potential concussion given her headache. Discussed continuing to monitor and if not continuing to improve would consider imaging. Discussed evaluation by her eye doctor given  her chronic progressive vision changes. She is given return precautions.   [name], MD [place]

## 2015-08-20 NOTE — Assessment & Plan Note (Signed)
Resolved. No obvious gum or facial swelling today. No lip swelling today. She does have white plaque on the inner aspect of her lips and oral mucosa. Unsure what this is. No other mouth involvement. Doubt this is angioedema given involvement of just part of her lip. We will have her follow-up with her dentist on Monday as planned. If no evidence of dental abscess we will need to keep her off of lisinopril. She will complete the clindamycin. Encouraged a probiotic while on clindamycin. She was given return precautions.

## 2015-08-20 NOTE — Patient Instructions (Signed)
Nice to see you. Please continue your clindamycin. I recommend starting a probiotic for this.  Please keep your appointment with your dentist. Please monitor your headaches. If they continue to improve that is great, though if they do not continue to improve please let us know as we will need to consider scanning her head.  If you develop tongue swelling, throat swelling, shortness of breath, fevers, worsening headaches, sudden onset headache that is worse of your life, numbness, weakness, vision changes, lip swelling, or any new or changing symptoms please seek medical attention immediately.

## 2015-08-20 NOTE — Progress Notes (Signed)
Pre visit review using our clinic review tool, if applicable. No additional management support is needed unless otherwise documented below in the visit note. 

## 2015-08-20 NOTE — Assessment & Plan Note (Signed)
Patient with intermittent improving headaches since falling and potentially hitting the back of her head. She is neurologically intact today. Has been improving. Suspect potential concussion given her headache. Discussed continuing to monitor and if not continuing to improve would consider imaging. Discussed evaluation by her eye doctor given her chronic progressive vision changes. She is given return precautions.

## 2015-08-23 ENCOUNTER — Ambulatory Visit: Payer: BLUE CROSS/BLUE SHIELD | Admitting: Internal Medicine

## 2015-09-06 DIAGNOSIS — M25572 Pain in left ankle and joints of left foot: Secondary | ICD-10-CM | POA: Diagnosis not present

## 2015-09-20 DIAGNOSIS — X58XXXA Exposure to other specified factors, initial encounter: Secondary | ICD-10-CM | POA: Diagnosis not present

## 2015-09-20 DIAGNOSIS — S82832A Other fracture of upper and lower end of left fibula, initial encounter for closed fracture: Secondary | ICD-10-CM | POA: Diagnosis not present

## 2015-09-20 DIAGNOSIS — S8262XA Displaced fracture of lateral malleolus of left fibula, initial encounter for closed fracture: Secondary | ICD-10-CM | POA: Diagnosis not present

## 2015-09-20 DIAGNOSIS — M25572 Pain in left ankle and joints of left foot: Secondary | ICD-10-CM | POA: Diagnosis not present

## 2015-09-20 DIAGNOSIS — S8261XA Displaced fracture of lateral malleolus of right fibula, initial encounter for closed fracture: Secondary | ICD-10-CM | POA: Diagnosis not present

## 2015-10-01 ENCOUNTER — Encounter (INDEPENDENT_AMBULATORY_CARE_PROVIDER_SITE_OTHER): Payer: Self-pay

## 2015-10-01 ENCOUNTER — Ambulatory Visit (INDEPENDENT_AMBULATORY_CARE_PROVIDER_SITE_OTHER): Payer: BLUE CROSS/BLUE SHIELD | Admitting: Family Medicine

## 2015-10-01 VITALS — BP 120/84 | HR 93 | Temp 97.9°F | Wt 151.0 lb

## 2015-10-01 DIAGNOSIS — F419 Anxiety disorder, unspecified: Secondary | ICD-10-CM

## 2015-10-01 DIAGNOSIS — G4733 Obstructive sleep apnea (adult) (pediatric): Secondary | ICD-10-CM | POA: Diagnosis not present

## 2015-10-01 DIAGNOSIS — G933 Postviral fatigue syndrome: Secondary | ICD-10-CM

## 2015-10-01 DIAGNOSIS — R5383 Other fatigue: Secondary | ICD-10-CM | POA: Insufficient documentation

## 2015-10-01 DIAGNOSIS — G9331 Postviral fatigue syndrome: Secondary | ICD-10-CM

## 2015-10-01 MED ORDER — CLONAZEPAM 1 MG PO TABS: 1.0000 mg | ORAL_TABLET | Freq: Three times a day (TID) | ORAL | 1 refills | Status: DC

## 2015-10-01 NOTE — Patient Instructions (Signed)
We will call about the sleep study.  Follow up in 3-6 months.  Take care  Dr. Lacinda Axon

## 2015-10-01 NOTE — Assessment & Plan Note (Signed)
Stable. Klonopin refilled.  

## 2015-10-01 NOTE — Assessment & Plan Note (Signed)
New problem. Post viral (on top of presumed OSA). Supportive care. Arranging sleep study.

## 2015-10-01 NOTE — Progress Notes (Signed)
Subjective:  Patient ID: Christie Ramirez, female    DOB: 1955-03-02  Age: 60 y.o. MRN: [id number]  CC: Fatigue  HPI:  60 year old female with suspected obstructive sleep apnea presents with complaints of fatigue.  Patient reports that she was sick earlier this week. She developed low-grade temperature, sinus congestion/pressure, and diarrhea. She took some Sudafed for her sinus congestion/pressure in her symptoms slowly improved. Diarrhea resolved as of yesterday. She reports that since her illness she has been very fatigued. She has been sleeping and excessive amount. She's even caught herself falling asleep at dinner. She reports daytime sleepiness. Patient has a suspected history of obstructive sleep apnea per the EMR. No known relieving factors. No other associated symptoms. No other complaints at this time.  Also, patient needs refill on her Klonopin. She states her anxiety is stable.  Social Hx   Social History   Social History  . Marital status: Single    Spouse name: N/A  . Number of children: N/A  . Years of education: N/A   Social History Main Topics  . Smoking status: Never Smoker  . Smokeless tobacco: Never Used  . Alcohol use 0.0 oz/week     Comment: wine- daily  . Drug use: No  . Sexual activity: Yes    Birth control/ protection: Surgical   Other Topics Concern  . Not on file   Social History Narrative  . No narrative on file    Review of Systems  Constitutional: Positive for fatigue.  HENT: Positive for congestion and sinus pressure.   Gastrointestinal: Positive for diarrhea.   Objective:  BP 120/84   Pulse 93   Temp 97.9 F (36.6 C)   Wt 151 lb (68.5 kg)   SpO2 95%   BMI 28.53 kg/m   BP/Weight 10/01/2015 08/20/2015 [id number]  Systolic BP [id number]  Diastolic BP 84 86 80  Wt. (Lbs) 151 153 151  BMI 28.53 28.91 28.53   Physical Exam  Constitutional: She is oriented to person, place, and time. She appears well-developed. No distress.    Cardiovascular: Normal rate and regular rhythm.   Pulmonary/Chest: Effort normal. She has no wheezes. She has no rales.  Neurological: She is alert and oriented to person, place, and time.  Psychiatric: She has a normal mood and affect.  Vitals reviewed.  Lab Results  Component Value Date   WBC 7.1 11/06/2014   HGB 12.4 11/06/2014   HCT 36.9 11/06/2014   PLT 351.0 11/06/2014   GLUCOSE 101 (H) 08/11/2015   CHOL 197 08/11/2015   TRIG 148.0 08/11/2015   HDL 61.50 08/11/2015   LDLDIRECT 82.0 05/07/2014   LDLCALC 106 (H) 08/11/2015   ALT 26 08/11/2015   AST 16 08/11/2015   NA 140 08/11/2015   K 4.3 08/11/2015   CL 106 08/11/2015   CREATININE 0.93 08/11/2015   BUN 32 (H) 08/11/2015   CO2 25 08/11/2015   TSH 10.55 (H) 08/11/2015   INR 0.9 02/24/2013   HGBA1C 5.7 05/07/2014   MICROALBUR <0.7 05/07/2014    Assessment & Plan:   Problem List Items Addressed This Visit    Anxiety    Stable. Klonopin refilled.       OSA (obstructive sleep apnea)    Clinical history consistent with this. Arranging sleep study.      Relevant Orders   Split night study   Fatigue - Primary    New problem. Post viral (on top of presumed OSA). Supportive care. Arranging sleep study.  Other Visit Diagnoses   None.     Meds ordered this encounter  Medications  . clonazePAM (KLONOPIN) 1 MG tablet    Sig: Take 1 tablet (1 mg total) by mouth 3 (three) times daily.    Dispense:  90 tablet    Refill:  1    Follow-up: 3-6 months  [place]

## 2015-10-01 NOTE — Assessment & Plan Note (Signed)
Clinical history consistent with this. Arranging sleep study.

## 2015-10-12 ENCOUNTER — Ambulatory Visit: Payer: BLUE CROSS/BLUE SHIELD | Attending: Internal Medicine

## 2015-10-19 ENCOUNTER — Ambulatory Visit (INDEPENDENT_AMBULATORY_CARE_PROVIDER_SITE_OTHER): Payer: BLUE CROSS/BLUE SHIELD | Admitting: Family Medicine

## 2015-10-19 DIAGNOSIS — J40 Bronchitis, not specified as acute or chronic: Secondary | ICD-10-CM

## 2015-10-19 MED ORDER — BENZONATATE 200 MG PO CAPS: 200.0000 mg | ORAL_CAPSULE | Freq: Two times a day (BID) | ORAL | 0 refills | Status: DC | PRN

## 2015-10-19 MED ORDER — ALBUTEROL SULFATE HFA 108 (90 BASE) MCG/ACT IN AERS: 2.0000 | INHALATION_SPRAY | Freq: Four times a day (QID) | RESPIRATORY_TRACT | 0 refills | Status: DC | PRN

## 2015-10-19 NOTE — Progress Notes (Signed)
  [name], MD Phone: [phone]  Christie Ramirez is a 60 y.o. female who presents today for same-day visit.  Patient with onset of symptoms yesterday. Started with sore throat and then developed cough. Nonproductive. Eyes have been watering. No upper respiratory congestion. Ears feel a little full. Notes some body aches. Some mild wheezing sensation though no shortness of breath. No fevers. Has tried salt water gargles and cough drops.  ROS see history of present illness  Objective  Physical Exam Vitals:   10/19/15 0959  BP: 118/82  Pulse: 84  Temp: 97.7 F (36.5 C)    BP Readings from Last 3 Encounters:  10/19/15 118/82  10/01/15 120/84  08/20/15 128/86   Wt Readings from Last 3 Encounters:  10/19/15 155 lb 3.2 oz (70.4 kg)  10/01/15 151 lb (68.5 kg)  08/20/15 153 lb (69.4 kg)    Physical Exam  Constitutional: No distress.  Cardiovascular: Normal rate, regular rhythm and normal heart sounds.   Pulmonary/Chest: Effort normal. No respiratory distress. She has no wheezes. She has no rales.  Mildly coarse breath sounds throughout  Neurological: She is alert. Gait normal.  Skin: She is not diaphoretic.     Assessment/Plan: Please see individual problem list.  Bronchitis Patient's symptoms most consistent with viral bronchitis. Vital signs stable. She is in no distress. We'll treat supportively with albuterol inhaler and Tessalon for cough. Tylenol or ibuprofen for sore throat. Given return precautions.   No orders of the defined types were placed in this encounter.   Meds ordered this encounter  Medications  . albuterol (PROVENTIL HFA;VENTOLIN HFA) 108 (90 Base) MCG/ACT inhaler    Sig: Inhale 2 puffs into the lungs every 6 (six) hours as needed for wheezing or shortness of breath.    Dispense:  1 Inhaler    Refill:  0  . benzonatate (TESSALON) 200 MG capsule    Sig: Take 1 capsule (200 mg total) by mouth 2 (two) times daily as needed for cough.   Dispense:  20 capsule    Refill:  0    [name], MD [place]

## 2015-10-19 NOTE — Assessment & Plan Note (Signed)
Patient's symptoms most consistent with viral bronchitis. Vital signs stable. She is in no distress. We'll treat supportively with albuterol inhaler and Tessalon for cough. Tylenol or ibuprofen for sore throat. Given return precautions.

## 2015-10-19 NOTE — Patient Instructions (Signed)
Nice to see you. You likely have a virus. We will treat supportively with an albuterol inhaler and Tessalon for cough. You can also take ibuprofen or Tylenol for sore throat. If you develop shortness of breath, cough productive of blood, or persistent fevers seek medical attention.

## 2015-10-19 NOTE — Progress Notes (Signed)
Pre visit review using our clinic review tool, if applicable. No additional management support is needed unless otherwise documented below in the visit note. 

## 2015-10-20 ENCOUNTER — Telehealth: Payer: Self-pay | Admitting: *Deleted

## 2015-10-20 NOTE — Telephone Encounter (Signed)
Pt will need to schedule another appt.

## 2015-10-20 NOTE — Telephone Encounter (Signed)
Nothing in notes about antibiotic to be given if not better. Please advise if a antibiotic is needed?

## 2015-10-20 NOTE — Telephone Encounter (Signed)
Patient questioned if she should come ito see Dr. Caryl Bis again, she was seen on yesterday for a viral cold. This morning pt has yellow mucus, fever and her wheezing is worse. She requested an antibiotic Pt contact [phone]

## 2015-10-20 NOTE — Telephone Encounter (Signed)
Scheduled

## 2015-10-20 NOTE — Telephone Encounter (Signed)
I would suggest re-evaluation. There was no indication for antibiotics when I saw her yesterday. I would need to re-evaluate her to determine if there was a need at this time. Thanks.

## 2015-10-21 ENCOUNTER — Encounter: Payer: Self-pay | Admitting: Family Medicine

## 2015-10-21 ENCOUNTER — Ambulatory Visit (INDEPENDENT_AMBULATORY_CARE_PROVIDER_SITE_OTHER): Payer: BLUE CROSS/BLUE SHIELD | Admitting: Family Medicine

## 2015-10-21 DIAGNOSIS — J40 Bronchitis, not specified as acute or chronic: Secondary | ICD-10-CM | POA: Diagnosis not present

## 2015-10-21 MED ORDER — AZITHROMYCIN 250 MG PO TABS: ORAL_TABLET | ORAL | 0 refills | Status: DC

## 2015-10-21 MED ORDER — PSEUDOEPH-BROMPHEN-DM 30-2-10 MG/5ML PO SYRP: 5.0000 mL | ORAL_SOLUTION | Freq: Four times a day (QID) | ORAL | 0 refills | Status: DC | PRN

## 2015-10-21 NOTE — Progress Notes (Signed)
  [name], MD Phone: [phone]  Christie Ramirez is a 60 y.o. female who presents today for same-day visit.  Patient seen 2 days ago for bronchitis. She notes cough has gotten worse. She is now coughing up yellow mucus and blowing yellow mucus out of her nose. Had a temperature of 100F yesterday. Is wheezing. No shortness of breath. Some upper respiratory congestion. Reports that her significant other told her she is gasping for air on occasion at night. Was supposed to have a sleep study previously.  ROS see history of present illness  Objective  Physical Exam Vitals:   10/21/15 0813  BP: 118/90  Pulse: 98  Temp: 98.4 F (36.9 C)    BP Readings from Last 3 Encounters:  10/21/15 118/90  10/19/15 118/82  10/01/15 120/84   Wt Readings from Last 3 Encounters:  10/21/15 152 lb 8 oz (69.2 kg)  10/19/15 155 lb 3.2 oz (70.4 kg)  10/01/15 151 lb (68.5 kg)    Physical Exam  Constitutional: No distress.  HENT:  Head: Normocephalic and atraumatic.  Mouth/Throat: Oropharynx is clear and moist. No oropharyngeal exudate.  Normal TMs bilaterally  Eyes: Conjunctivae are normal. Pupils are equal, round, and reactive to light.  Cardiovascular: Normal rate, regular rhythm and normal heart sounds.   Pulmonary/Chest: Effort normal. No respiratory distress. She has no wheezes.  Coarse breath sounds bilaterally slightly worse than previously  Neurological: She is alert. Gait normal.  Skin: Skin is warm and dry. She is not diaphoretic.     Assessment/Plan: Please see individual problem list.  Bronchitis Symptoms have worsened somewhat. Slight elevation in temperature yesterday. Lung exam with slight worsening in the coarseness though no focal findings to indicate a pneumonia. Vital signs are stable. Still suspect bronchitis though given slight worsening of symptoms we'll start her on azithromycin. Cough suppressant per below as she has been taking this at home with no adverse  effects. If does not improve with antibiotics would consider chest x-ray. Given return precautions.   No orders of the defined types were placed in this encounter.   Meds ordered this encounter  Medications  . azithromycin (ZITHROMAX) 250 MG tablet    Sig: Take 500 mg (2 tablets) by mouth today, then take 250 mg (one tablet) by mouth daily until gone    Dispense:  6 tablet    Refill:  0  . brompheniramine-pseudoephedrine-DM 30-2-10 MG/5ML syrup    Sig: Take 5 mLs by mouth 4 (four) times daily as needed.    Dispense:  120 mL    Refill:  0    [name], MD [place]

## 2015-10-21 NOTE — Assessment & Plan Note (Signed)
Symptoms have worsened somewhat. Slight elevation in temperature yesterday. Lung exam with slight worsening in the coarseness though no focal findings to indicate a pneumonia. Vital signs are stable. Still suspect bronchitis though given slight worsening of symptoms we'll start her on azithromycin. Cough suppressant per below as she has been taking this at home with no adverse effects. If does not improve with antibiotics would consider chest x-ray. Given return precautions.

## 2015-10-21 NOTE — Patient Instructions (Signed)
Nice to see you. We are going to treat you with azithromycin and cough suppressant. You should continue the albuterol inhaler. We will get she is set up for a sleep study as well. If you develop shortness of breath, cough productive of blood, fevers, or any new or changing symptoms please seek medical attention.

## 2015-11-01 ENCOUNTER — Ambulatory Visit: Payer: BLUE CROSS/BLUE SHIELD | Admitting: Family Medicine

## 2015-11-10 DIAGNOSIS — S82832K Other fracture of upper and lower end of left fibula, subsequent encounter for closed fracture with nonunion: Secondary | ICD-10-CM | POA: Insufficient documentation

## 2015-11-11 ENCOUNTER — Ambulatory Visit (INDEPENDENT_AMBULATORY_CARE_PROVIDER_SITE_OTHER): Payer: BLUE CROSS/BLUE SHIELD | Admitting: Family Medicine

## 2015-11-11 ENCOUNTER — Encounter: Payer: Self-pay | Admitting: Family Medicine

## 2015-11-11 VITALS — BP 110/82 | HR 99 | Temp 98.1°F | Wt 152.5 lb

## 2015-11-11 DIAGNOSIS — S82832A Other fracture of upper and lower end of left fibula, initial encounter for closed fracture: Secondary | ICD-10-CM | POA: Diagnosis not present

## 2015-11-11 DIAGNOSIS — E039 Hypothyroidism, unspecified: Secondary | ICD-10-CM | POA: Diagnosis not present

## 2015-11-11 DIAGNOSIS — S82409A Unspecified fracture of shaft of unspecified fibula, initial encounter for closed fracture: Secondary | ICD-10-CM

## 2015-11-11 DIAGNOSIS — I10 Essential (primary) hypertension: Secondary | ICD-10-CM

## 2015-11-11 DIAGNOSIS — J40 Bronchitis, not specified as acute or chronic: Secondary | ICD-10-CM | POA: Diagnosis not present

## 2015-11-11 HISTORY — DX: Unspecified fracture of shaft of unspecified fibula, initial encounter for closed fracture: S82.409A

## 2015-11-11 NOTE — Patient Instructions (Signed)
Nice to see you. Your cough is likely related to post viral cough. You should continue to monitor and if it does not improve over the next several weeks please let us know and we will obtain a chest x-ray. We'll check your TSH today. Please call your orthopedic surgeon and find out what the risks and benefits of the surgery that has been proposed a.

## 2015-11-11 NOTE — Assessment & Plan Note (Signed)
Following with orthopedics for this. She reports no pain at this time. They wanted to perform surgery to help the area heal. I advised her to give them a call to discuss risks and benefits of the surgery. She will need to balance this once she knows what they are.

## 2015-11-11 NOTE — Progress Notes (Signed)
Pre visit review using our clinic review tool, if applicable. No additional management support is needed unless otherwise documented below in the visit note. 

## 2015-11-11 NOTE — Assessment & Plan Note (Signed)
At goal. Continue current medications. 

## 2015-11-11 NOTE — Assessment & Plan Note (Signed)
Check TSH today. Continue Synthroid.

## 2015-11-11 NOTE — Progress Notes (Signed)
  [name], MD Phone: [phone]  Christie Ramirez is a 60 y.o. female who presents today for f/u.  HYPERTENSION  Disease Monitoring  Home BP Monitoring 120s over 80s Chest pain- no    Dyspnea- no Medications  Compliance-  taking losartan, amlodipine.  Edema- no  HYPOTHYROIDISM Disease Monitoring Weight changes: None  Skin Changes: No Heat/Cold intolerance: No  Medication Monitoring Compliance:  Taking Synthroid 150 g daily   Last TSH:   Lab Results  Component Value Date   TSH 10.55 (H) 08/11/2015   Bronchitis: Patient notes her cough is persistent though she feels quite a bit better. Cough is nonproductive. Feels as though there is something to cough up but nothing comes up. No chest pain, shortness of breath, fevers, or tiredness. She does feel better.  She saw her orthopedic surgeon yesterday. They proposed doing surgery on her left ankle which was fractured last year. Notes it has not quite healed on MRI imaging. She is unsure if she wants to do this.  PMH: nonsmoker.   ROS see history of present illness  Objective  Physical Exam Vitals:   11/11/15 1536  BP: 110/82  Pulse: 99  Temp: 98.1 F (36.7 C)    BP Readings from Last 3 Encounters:  11/11/15 110/82  10/21/15 118/90  10/19/15 118/82   Wt Readings from Last 3 Encounters:  11/11/15 152 lb 8 oz (69.2 kg)  10/21/15 152 lb 8 oz (69.2 kg)  10/19/15 155 lb 3.2 oz (70.4 kg)    Physical Exam  Constitutional: She is well-developed, well-nourished, and in no distress.  Cardiovascular: Normal rate, regular rhythm and normal heart sounds.   Pulmonary/Chest: Effort normal and breath sounds normal.  Musculoskeletal:  Left ankle with no tenderness or swelling  Neurological: She is alert. Gait normal.  Skin: Skin is warm and dry.     Assessment/Plan: Please see individual problem list.  Hypertension At goal. Continue current medications.  Bronchitis Overall improved. Cough likely related to  postviral irritation. Benign lung exam. Discussed monitoring for the next several weeks and if not improving performing a chest x-ray. No focal findings to indicate any bacterial issues at this time.  Hypothyroidism Check TSH today. Continue Synthroid.  Fibula fracture Following with orthopedics for this. She reports no pain at this time. They wanted to perform surgery to help the area heal. I advised her to give them a call to discuss risks and benefits of the surgery. She will need to balance this once she knows what they are.   Orders Placed This Encounter  Procedures  . TSH    [name], MD [place]

## 2015-11-11 NOTE — Assessment & Plan Note (Signed)
Overall improved. Cough likely related to postviral irritation. Benign lung exam. Discussed monitoring for the next several weeks and if not improving performing a chest x-ray. No focal findings to indicate any bacterial issues at this time.

## 2015-11-12 LAB — TSH: TSH: 1.4 u[IU]/mL (ref 0.35–4.50)

## 2015-11-15 ENCOUNTER — Telehealth: Payer: Self-pay | Admitting: Family Medicine

## 2015-11-15 NOTE — Telephone Encounter (Signed)
Pt called wanting to get genic screen testing done for Breast and Thyroid cancer. Please advise?   Call pt @ [phone]. Thank you!

## 2015-11-15 NOTE — Telephone Encounter (Signed)
LM for patient to return call.

## 2015-11-15 NOTE — Telephone Encounter (Signed)
Can you find out from the patient why she is interested in these things? Is there a family history or some other concern.

## 2015-11-15 NOTE — Telephone Encounter (Signed)
Please advise 

## 2015-11-16 NOTE — Telephone Encounter (Signed)
No family history of cancer. She said she was reading an article from the "[employer]" and a gene test is about $50.00, she had thyroid cancer in 1995 and breast cancer in 2005.

## 2015-11-16 NOTE — Telephone Encounter (Signed)
Pt called back returning your call. Thank you!  Call pt @ [phone]

## 2015-11-17 NOTE — Telephone Encounter (Signed)
Spoke with patient and gave her all the information in the message. She would like to think about it and give [place] a call back to let us know if she would like to move forward

## 2015-11-17 NOTE — Telephone Encounter (Signed)
Unfortunately there is not a specific test that I know of for genetic testing for thyroid cancer. There are genetic tests for breast cancer that we could consider given the age at which she had breast cancer. If she would like testing I would suggest meeting with a genetic counselor first. If she wants to consider genetic testing for cancer we can refer her to a gynecologist who has the capability to do a wide ranging screening test in their office.

## 2015-11-25 ENCOUNTER — Ambulatory Visit: Payer: BLUE CROSS/BLUE SHIELD | Attending: Otolaryngology

## 2015-11-25 DIAGNOSIS — R0683 Snoring: Secondary | ICD-10-CM | POA: Diagnosis not present

## 2015-11-25 DIAGNOSIS — G4761 Periodic limb movement disorder: Secondary | ICD-10-CM | POA: Insufficient documentation

## 2015-11-25 DIAGNOSIS — G4733 Obstructive sleep apnea (adult) (pediatric): Secondary | ICD-10-CM | POA: Insufficient documentation

## 2015-12-11 ENCOUNTER — Other Ambulatory Visit: Payer: Self-pay | Admitting: Internal Medicine

## 2015-12-14 NOTE — Telephone Encounter (Signed)
Last filled 11/23/15. Last OV 11/11/15.

## 2015-12-14 NOTE — Telephone Encounter (Signed)
faxed

## 2015-12-14 NOTE — Telephone Encounter (Signed)
Refill given. Please fax. 

## 2016-01-20 ENCOUNTER — Telehealth: Payer: Self-pay

## 2016-01-20 NOTE — Telephone Encounter (Signed)
Patient came in today requesting results of sleep study done 11/25/15. 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 Please advise

## 2016-01-20 NOTE — Telephone Encounter (Signed)
Left message to return call 

## 2016-01-20 NOTE — Telephone Encounter (Signed)
She has sleep apnea and periodic limb movement. Needs CPAP.

## 2016-01-25 NOTE — Telephone Encounter (Signed)
Left message to return call 

## 2016-01-27 NOTE — Telephone Encounter (Signed)
Unable to reach patient.

## 2016-02-02 ENCOUNTER — Encounter: Payer: Self-pay | Admitting: Obstetrics and Gynecology

## 2016-02-02 ENCOUNTER — Ambulatory Visit (INDEPENDENT_AMBULATORY_CARE_PROVIDER_SITE_OTHER): Payer: BLUE CROSS/BLUE SHIELD | Admitting: Obstetrics and Gynecology

## 2016-02-02 VITALS — BP 148/97 | HR 98 | Ht 61.0 in | Wt 161.3 lb

## 2016-02-02 DIAGNOSIS — Z86000 Personal history of in-situ neoplasm of breast: Secondary | ICD-10-CM

## 2016-02-02 DIAGNOSIS — N6310 Unspecified lump in the right breast, unspecified quadrant: Secondary | ICD-10-CM | POA: Diagnosis not present

## 2016-02-02 NOTE — Progress Notes (Signed)
Chief complaint: 1. Right breast lump 2. History of DCIS, right breast  Patient presents for evaluation of newly identified right breast nodule noted on exam 2 days ago. While showering she noted a lump near her radius lumpectomy scar. This lesion is different from what she had previously experienced doing self breast exams. She denies breast pain. She denies nipple discharge.  Past medical history, past surgical history, problem list, medications, and allergies are reviewed  OBJECTIVE: BP (!) 148/97   Pulse 98   Ht 5\' 1"  (1.549 m)   Wt 161 lb 4.8 oz (73.2 kg)   BMI 30.48 kg/m  Pleasant well-appearing female in no acute distress. Alert and oriented. Neck: Supple, without thyromegaly or adenopathy Lymph node survey: No supraclavicular or axillary adenopathy Breasts: Asymmetry is present with left larger than right. No skin changes or nipple discharge. Lumpectomy scar is noted in the upper outer quadrant of the right breast; there is a fibroglandular thickening 2 x 3 cm just inferior to the lumpectomy scar which is slightly tender; this thickening is mobile.  ASSESSMENT: 1. Right breast lump 2 x 3 cm, upper outer quadrant just inferior to prior lumpectomy scar; cannot determine if this is scar tissue versus a newly identified lump 2. History of abnormal mammogram in June 2017  PLAN: 1. Diagnostic mammogram with ultrasound of right breast 2. Follow-up after study results are available for further management planning.  A total of 15 minutes were spent face-to-face with the patient during this encounter and over half of that time dealt with counseling and coordination of care.  Brayton Mars, MD  Note: This dictation was prepared with Dragon dictation along with smaller phrase technology. Any transcriptional errors that result from this process are unintentional.

## 2016-02-02 NOTE — Patient Instructions (Signed)
1. Diagnostic mammogram and ultrasound of right breast*ordered 2. Follow-up will be made after results are available

## 2016-02-08 ENCOUNTER — Inpatient Hospital Stay
Admission: RE | Admit: 2016-02-08 | Discharge: 2016-02-08 | Disposition: A | Discharge status: Home / Self Care | Payer: Self-pay | Source: Ambulatory Visit | Attending: *Deleted | Admitting: *Deleted

## 2016-02-08 ENCOUNTER — Other Ambulatory Visit: Payer: Self-pay | Admitting: *Deleted

## 2016-02-08 DIAGNOSIS — Z9289 Personal history of other medical treatment: Secondary | ICD-10-CM

## 2016-02-21 ENCOUNTER — Ambulatory Visit
Admission: RE | Admit: 2016-02-21 | Discharge: 2016-02-21 | Disposition: A | Discharge status: Home / Self Care | Payer: BLUE CROSS/BLUE SHIELD | Source: Ambulatory Visit | Attending: Obstetrics and Gynecology | Admitting: Obstetrics and Gynecology

## 2016-02-21 ENCOUNTER — Other Ambulatory Visit: Payer: Self-pay | Admitting: Obstetrics and Gynecology

## 2016-02-21 DIAGNOSIS — R921 Mammographic calcification found on diagnostic imaging of breast: Secondary | ICD-10-CM | POA: Insufficient documentation

## 2016-02-21 DIAGNOSIS — N6341 Unspecified lump in right breast, subareolar: Secondary | ICD-10-CM | POA: Insufficient documentation

## 2016-02-21 DIAGNOSIS — N6310 Unspecified lump in the right breast, unspecified quadrant: Secondary | ICD-10-CM

## 2016-02-24 ENCOUNTER — Encounter: Payer: Self-pay | Admitting: Obstetrics and Gynecology

## 2016-02-24 ENCOUNTER — Ambulatory Visit (INDEPENDENT_AMBULATORY_CARE_PROVIDER_SITE_OTHER): Payer: BLUE CROSS/BLUE SHIELD | Admitting: Obstetrics and Gynecology

## 2016-02-24 ENCOUNTER — Other Ambulatory Visit: Payer: Self-pay | Admitting: Family Medicine

## 2016-02-24 VITALS — BP 145/97 | HR 87 | Ht 61.0 in | Wt 157.0 lb

## 2016-02-24 DIAGNOSIS — Z86000 Personal history of in-situ neoplasm of breast: Secondary | ICD-10-CM | POA: Diagnosis not present

## 2016-02-24 DIAGNOSIS — M8580 Other specified disorders of bone density and structure, unspecified site: Secondary | ICD-10-CM | POA: Diagnosis not present

## 2016-02-24 DIAGNOSIS — N6310 Unspecified lump in the right breast, unspecified quadrant: Secondary | ICD-10-CM | POA: Diagnosis not present

## 2016-02-24 MED ORDER — RALOXIFENE HCL 60 MG PO TABS: 60.0000 mg | ORAL_TABLET | Freq: Every day | ORAL | 1 refills | Status: DC

## 2016-02-24 NOTE — Telephone Encounter (Signed)
Last OV 11/11/15 last filled 12/14/15 30 1rf

## 2016-02-24 NOTE — Telephone Encounter (Signed)
faxed

## 2016-02-25 NOTE — Progress Notes (Signed)
Chief complaint: 1. Osteopenia 2. Right breast mass  Patient presents for follow-up. She recently had diagnostic mammogram with benign findings consistent with probable fat necrosis in the site of her previous lumpectomy. She is scheduled to have repeat mammogram in 6 months.  Patient has history of osteopenia. Options of management for osteopenia were reviewed and patient is willing to go on trial of raloxifene milligrams a day.  She is to continue calcium with vitamin D supplementation.  OBJECTIVE: BP (!) 145/97   Pulse 87   Ht 5\' 1"  (1.549 m)   Wt 157 lb (71.2 kg)   BMI 29.66 kg/m   Physical exam-deferred  ASSESSMENT: 1. Probable benign right breast mass-likely consistent with fat necrosis 2. Osteopenia  PLAN: 1. Repeat diagnostic mammogram and ultrasound in 6 months 2. Begin raloxifene 60 mg daily for osteopenia  A total of 15 minutes were spent face-to-face with the patient during this encounter and over half of that time dealt with counseling and coordination of care.  Brayton Mars, MD  Note: This dictation was prepared with Dragon dictation along with smaller phrase technology. Any transcriptional errors that result from this process are unintentional.

## 2016-02-25 NOTE — Patient Instructions (Signed)
1. Begin Evista 60 mg a day 2. Return for annual exam as scheduled 3. Diagnostic mammogram and ultrasound will be obtained in 6 months per radiology recommendations   Raloxifene tablets What is this medicine? RALOXIFENE (ral OX i feen) reduces the amount of calcium lost from bones. It is used to treat and prevent osteoporosis in women who have experienced menopause. This medicine may be used for other purposes; ask your health care provider or pharmacist if you have questions. COMMON BRAND NAME(S): Evista What should I tell my health care provider before I take this medicine? They need to know if you have any of these conditions: -a history of blood clots -cancer -heart failure -liver disease -premenopausal -an unusual or allergic reaction to raloxifene, other medicines, foods, dyes, or preservatives -pregnant or trying to get pregnant -breast-feeding How should I use this medicine? Take this medicine by mouth with a glass of water. Follow the directions on the prescription label. The tablets can be taken with or without food. Take your doses at regular intervals. Do not take your medicine more often than directed. Talk to your pediatrician regarding the use of this medicine in children. Special care may be needed. Overdosage: If you think you have taken too much of this medicine contact a poison control center or emergency room at once. NOTE: This medicine is only for you. Do not share this medicine with others. What if I miss a dose? If you miss a dose, take it as soon as you can. If it is almost time for your next dose, take only that dose. Do not take double or extra doses. What may interact with this medicine? -ampicillin -cholestyramine -colestipol -diazepam -diazoxide -female hormones like hormone replacement therapy -lidocaine -warfarin This list may not describe all possible interactions. Give your health care provider a list of all the medicines, herbs, non-prescription  drugs, or dietary supplements you use. Also tell them if you smoke, drink alcohol, or use illegal drugs. Some items may interact with your medicine. What should I watch for while using this medicine? Visit your doctor or health care professional for regular checks on your progress. Do not stop taking this medicine except on the advice of your doctor or health care professional. You should make sure you get enough calcium and vitamin D in your diet while you are taking this medicine. Discuss your dietary needs with your health care professional or nutritionist. Exercise may help to prevent bone loss. Discuss your exercise needs with your doctor or health care professional. This medicine can rarely cause blood clots. You should avoid long periods of bed rest while taking this medicine. If you are going to have surgery, tell your doctor or health care professional that you are taking this medicine. This medicine should be stopped at least 3 days before surgery. After surgery, it should be restarted only after you are walking again. It should not be restarted while you still need long periods of bed rest. You should not smoke while taking this medicine. Smoking may also increase your risk of blood clots. Smoking can also decrease the effects of this medicine. This medicine does not prevent hot flashes. It may cause hot flashes in some patients at the start of therapy. What side effects may I notice from receiving this medicine? Side effects that you should report to your doctor or health care professional as soon as possible: -change in vision -chest pain -difficulty breathing -leg pain or swelling -skin rash, itching Side effects that usually  do not require medical attention (report to your doctor or health care professional if they continue or are bothersome): -fluid build-up -leg cramps -stomach pain -sweating This list may not describe all possible side effects. Call your doctor for medical  advice about side effects. You may report side effects to FDA at 1-800-FDA-1088. Where should I keep my medicine? Keep out of the reach of children. Store at room temperature between 15 and 30 degrees C (59 and 86 degrees F). Throw away any unused medicine after the expiration date. NOTE: This sheet is a summary. It may not cover all possible information. If you have questions about this medicine, talk to your doctor, pharmacist, or health care provider.  2017 [employer] (2007-12-26 15:15:14)

## 2016-03-04 DIAGNOSIS — J209 Acute bronchitis, unspecified: Secondary | ICD-10-CM | POA: Diagnosis not present

## 2016-03-06 ENCOUNTER — Other Ambulatory Visit: Payer: Self-pay

## 2016-03-06 MED ORDER — LEVOTHYROXINE SODIUM 150 MCG PO TABS: ORAL_TABLET | ORAL | 3 refills | Status: DC

## 2016-03-06 NOTE — Telephone Encounter (Signed)
Last OV 11/11/2015 last filled by Dr.Walker 08/11/15 30 3rf

## 2016-03-07 ENCOUNTER — Ambulatory Visit (INDEPENDENT_AMBULATORY_CARE_PROVIDER_SITE_OTHER): Payer: BLUE CROSS/BLUE SHIELD | Admitting: Internal Medicine

## 2016-03-07 ENCOUNTER — Encounter: Payer: Self-pay | Admitting: Internal Medicine

## 2016-03-07 VITALS — BP 124/68 | HR 102 | Temp 98.3°F | Wt 159.0 lb

## 2016-03-07 DIAGNOSIS — J209 Acute bronchitis, unspecified: Secondary | ICD-10-CM | POA: Diagnosis not present

## 2016-03-07 NOTE — Progress Notes (Signed)
HPI  Pt presents to the clinic today with c/o cough. This started 4 days ago. The cough is productive of blood tinged clear mucous. She has coughed so hard at times that she vomits. She did have some loose stools yesterday but none today. She denies runny nose, ear pain, sore throat or shortness of breath. She denies fever, chills or body aches. She went to [facility], diagnosed with acute bronchitis. She was prescribed a zpack, cough medicine and an Albuterol inhaler. She has also taken Coricidin OTC with some relief. She has no history of allergies or breathing problems. She may have had sick contacts at work. She did not get her flu shot this year.   Review of Systems        Past Medical History:  Diagnosis Date  . Anxiety   . 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   . Cancer ([place]) 1995   thyroid  . Endometriosis 2009  . Hyperlipidemia   . Hypothyroidism   . Osteopenia    rt hip    Family History  Problem Relation Age of Onset  . Colon polyps Father     age 71  . Heart disease Father   . Heart disease Mother   . Heart disease Paternal Grandmother   . Heart disease Paternal Grandfather   . Cancer Neg Hx   . Diabetes Neg Hx     Social History   Social History  . Marital status: Single    Spouse name: N/A  . Number of children: N/A  . Years of education: N/A   Occupational History  . Not on file.   Social History Main Topics  . Smoking status: Never Smoker  . Smokeless tobacco: Never Used  . Alcohol use 0.0 oz/week     Comment: wine- daily  . Drug use: No  . Sexual activity: Yes    Birth control/ protection: Surgical   Other Topics Concern  . Not on file   Social History Narrative  . No narrative on file    Allergies  Allergen Reactions  . Codeine Anaphylaxis  . Ace Inhibitors   . Iodinated Diagnostic Agents Hives  . Penicillins Rash      Constitutional:  Denies headache, fatigue, fever or abrupt weight changes.  HEENT:  Denies eye redness, eye pain, pressure behind the eyes, facial pain, nasal congestion, ear pain, ringing in the ears, wax buildup, runny nose or sore throat. Respiratory: Positive cough. Denies difficulty breathing or shortness of breath.  Cardiovascular: Denies chest pain, chest tightness, palpitations or swelling in the hands or feet.   No other specific complaints in a complete review of systems (except as listed in HPI above).  Objective:   BP 124/68   Pulse (!) 102   Temp 98.3 F (36.8 C) (Oral)   Wt 159 lb (72.1 kg)   SpO2 98%   BMI 30.04 kg/m   Wt Readings from Last 3 Encounters:  02/24/16 157 lb (71.2 kg)  02/02/16 161 lb 4.8 oz (73.2 kg)  11/11/15 152 lb 8 oz (69.2 kg)     General: Appears her stated age, in NAD. HEENT: Head: normal shape and size, no sinus tenderness noted; Eyes: sclera white, no icterus, conjunctiva pink; Ears: Tm's gray and intact, normal light reflex; Nose: mucosa pink and moist, septum midline; Throat/Mouth: + PND. Teeth present, mucosa pink and moist, no exudate noted,  no lesions or ulcerations noted.  Neck: No cervical lymphadenopathy.  Cardiovascular: Normal rate and rhythm.  Pulmonary/Chest: Normal effort and coarse vesicular breath sounds. No respiratory distress. No wheezes, rales or ronchi noted.       Assessment & Plan:   Acute Bronchitis:  Get some rest and drink plenty of water Seems to be resolving No need for additional abx Continue RX cough syrup and inhaler as needed  RTC as needed or if symptoms persist.   Webb Silversmith, NP

## 2016-03-07 NOTE — Patient Instructions (Signed)

## 2016-03-08 ENCOUNTER — Other Ambulatory Visit: Payer: Self-pay

## 2016-03-08 MED ORDER — LEVOTHYROXINE SODIUM 150 MCG PO TABS: ORAL_TABLET | ORAL | 1 refills | Status: DC

## 2016-03-08 NOTE — Telephone Encounter (Signed)
Patient requested 90 day supply.

## 2016-03-10 ENCOUNTER — Telehealth: Payer: Self-pay | Admitting: *Deleted

## 2016-03-10 NOTE — Telephone Encounter (Signed)
Left message to return call 

## 2016-03-10 NOTE — Telephone Encounter (Signed)
Patient notified and will see how she feels Monday

## 2016-03-10 NOTE — Telephone Encounter (Signed)
Pt was diagnosed with the flu on 02/13, she has completed her antibiotic,however continues the wheezing. Pt questioned if she should have a breathing treatment  Pt contact  [phone]

## 2016-03-10 NOTE — Telephone Encounter (Signed)
It appears that the patient was prescribed an albuterol inhaler. Please determine if she has been using this to help with her wheezing. If she has not she can try using this. She should be reevaluated if she is having fevers, trouble breathing, or if she is coughing blood. If she is not feeling better she should also be reevaluated.

## 2016-03-10 NOTE — Telephone Encounter (Signed)
Pt requested a call pt [phone] She is aware of Dr. Caryl Bis statement , however she has questions

## 2016-03-10 NOTE — Telephone Encounter (Signed)
Please advise 

## 2016-04-10 ENCOUNTER — Other Ambulatory Visit: Payer: Self-pay | Admitting: Family Medicine

## 2016-04-10 NOTE — Telephone Encounter (Signed)
Dr.Sonnenberg pt. 

## 2016-04-12 NOTE — Telephone Encounter (Signed)
Last filled 10/01/15 90 1rf by Dr.Cook last Ov 11/10/16

## 2016-04-12 NOTE — Telephone Encounter (Signed)
faxed

## 2016-04-26 ENCOUNTER — Other Ambulatory Visit: Payer: Self-pay | Admitting: Family Medicine

## 2016-04-26 NOTE — Telephone Encounter (Signed)
Last OV 03/07/16 last filled 02/24/16 30 1rf

## 2016-04-26 NOTE — Telephone Encounter (Signed)
Patient has not been seen by me since October. Please get her set up for follow-up. Refill will be given. Please faxed to pharmacy.

## 2016-04-27 NOTE — Telephone Encounter (Signed)
Faxed, patient is scheduled for follow up 06/21/16

## 2016-06-01 DIAGNOSIS — Z808 Family history of malignant neoplasm of other organs or systems: Secondary | ICD-10-CM | POA: Diagnosis not present

## 2016-06-01 DIAGNOSIS — D485 Neoplasm of uncertain behavior of skin: Secondary | ICD-10-CM | POA: Diagnosis not present

## 2016-06-01 DIAGNOSIS — Z853 Personal history of malignant neoplasm of breast: Secondary | ICD-10-CM | POA: Diagnosis not present

## 2016-06-01 DIAGNOSIS — R234 Changes in skin texture: Secondary | ICD-10-CM | POA: Diagnosis not present

## 2016-06-01 DIAGNOSIS — L430 Hypertrophic lichen planus: Secondary | ICD-10-CM | POA: Diagnosis not present

## 2016-06-21 ENCOUNTER — Encounter: Payer: BLUE CROSS/BLUE SHIELD | Admitting: Family Medicine

## 2016-06-26 ENCOUNTER — Other Ambulatory Visit: Payer: Self-pay | Admitting: Family Medicine

## 2016-06-27 NOTE — Telephone Encounter (Signed)
Last OV 11/11/15 last filled 04/26/16 30 1rf

## 2016-06-27 NOTE — Telephone Encounter (Signed)
Refill given. Please fax. She needs an office visit for further refills.

## 2016-06-27 NOTE — Telephone Encounter (Signed)
Faxed, patient is scheduled for follow up

## 2016-07-06 ENCOUNTER — Ambulatory Visit (INDEPENDENT_AMBULATORY_CARE_PROVIDER_SITE_OTHER): Payer: BLUE CROSS/BLUE SHIELD | Admitting: Family Medicine

## 2016-07-06 ENCOUNTER — Encounter: Payer: Self-pay | Admitting: Family Medicine

## 2016-07-06 ENCOUNTER — Other Ambulatory Visit: Payer: Self-pay | Admitting: Family Medicine

## 2016-07-06 ENCOUNTER — Telehealth: Payer: Self-pay | Admitting: *Deleted

## 2016-07-06 ENCOUNTER — Ambulatory Visit (INDEPENDENT_AMBULATORY_CARE_PROVIDER_SITE_OTHER): Payer: BLUE CROSS/BLUE SHIELD

## 2016-07-06 VITALS — BP 130/80 | HR 111 | Temp 98.0°F | Wt 160.8 lb

## 2016-07-06 DIAGNOSIS — M25511 Pain in right shoulder: Secondary | ICD-10-CM | POA: Diagnosis not present

## 2016-07-06 DIAGNOSIS — S4991XA Unspecified injury of right shoulder and upper arm, initial encounter: Secondary | ICD-10-CM | POA: Diagnosis not present

## 2016-07-06 MED ORDER — CYCLOBENZAPRINE HCL 10 MG PO TABS: 10.0000 mg | ORAL_TABLET | Freq: Three times a day (TID) | ORAL | 0 refills | Status: DC | PRN

## 2016-07-06 NOTE — Patient Instructions (Signed)
Nice to see you. We'll get an x-ray.

## 2016-07-06 NOTE — Progress Notes (Signed)
  [name], MD Phone: [phone]  Christie Ramirez is a 61 y.o. female who presents today for same-day visit.  Patient reports yesterday she was in the living room with her family and tried to do a somersault. She fell directly onto her right shoulder. She noted no loss of consciousness or head injury. Noted immediate pain in her right shoulder. No bruising or swelling. She has iced it. She can sleep on it. She has trouble abducting and internally rotating it.  ROS see history of present illness  Objective  Physical Exam Vitals:   07/06/16 0944  BP: 130/80  Pulse: (!) 111  Temp: 98 F (36.7 C)    BP Readings from Last 3 Encounters:  07/06/16 130/80  03/07/16 124/68  02/24/16 (!) 145/97   Wt Readings from Last 3 Encounters:  07/06/16 160 lb 12.8 oz (72.9 kg)  03/07/16 159 lb (72.1 kg)  02/24/16 157 lb (71.2 kg)    Physical Exam  Constitutional: She is well-developed, well-nourished, and in no distress.  Cardiovascular: Normal rate, regular rhythm and normal heart sounds.   Pulmonary/Chest: Effort normal and breath sounds normal.  Musculoskeletal:  Right shoulder with some mild tenderness over the [facility] joint, also some tenderness over the trapezius posteriorly, no clavicular tenderness, no scapular tenderness, no humeral tenderness, no bony defects noted, full range of motion noted, active range of motion with discomfort on internal rotation and abduction, mild discomfort on passive internal rotation though otherwise no discomfort on passive range of motion  Neurological: She is alert.  5/5 biceps strength, triceps strength, hand grip strength bilaterally, sensation light touch intact in bilateral upper extremities, bilateral hands warm and well-perfused     Assessment/Plan: Please see individual problem list.  Acute pain of right shoulder Patient with fall on her right shoulder. Suspect muscular injury though given pain we will obtain an x-ray to rule out fracture.  We'll determine pain management once x-ray returns.   Orders Placed This Encounter  Procedures  . DG Shoulder Right    Standing Status:   Future    Number of Occurrences:   1    Standing Expiration Date:   09/05/2017    Order Specific Question:   Reason for Exam (SYMPTOM  OR DIAGNOSIS REQUIRED)    Answer:   right shoulder pain s/p fall on right shoulder    Order Specific Question:   Is patient pregnant?    Answer:   No    Order Specific Question:   Preferred imaging location?    Answer:   [facility] Specific Question:   Radiology Contrast Protocol - do NOT remove file path    Answer:   \\[email or web address]    [name], MD [place]

## 2016-07-06 NOTE — Assessment & Plan Note (Signed)
Patient with fall on her right shoulder. Suspect muscular injury though given pain we will obtain an x-ray to rule out fracture. We'll determine pain management once x-ray returns.

## 2016-07-06 NOTE — Telephone Encounter (Signed)
Patient requested Xray results Pt contact [phone]

## 2016-07-06 NOTE — Telephone Encounter (Signed)
See result note.  

## 2016-07-25 ENCOUNTER — Ambulatory Visit: Payer: BLUE CROSS/BLUE SHIELD | Admitting: Family Medicine

## 2016-07-25 ENCOUNTER — Encounter: Payer: BLUE CROSS/BLUE SHIELD | Admitting: Family Medicine

## 2016-07-25 DIAGNOSIS — Z0289 Encounter for other administrative examinations: Secondary | ICD-10-CM

## 2016-07-25 NOTE — Progress Notes (Deleted)
Patient ID: Christie Ramirez, female   DOB: 29-Oct-1955, 61 y.o.   MRN: [id number] ANNUAL PREVENTATIVE CARE GYN  ENCOUNTER NOTE  Subjective:       Christie Ramirez is a 61 y.o. No obstetric history on file. female here for a routine annual gynecologic exam.  Current complaints: 1.   Currently in a new relationship with probable marriage in the near future.  Both reportedly with negative STI history     Gynecologic History No LMP recorded. Patient has had a hysterectomy. Contraception: status post hysterectomy Last Pap: 06/2015 neg/neg. Results were: normal Last mammogram: 01/2016 birad 3, u/s rt breast- needs bilateral dx and rt breast u/s 06/2016. Results were: normal History of endometriosis  Obstetric History OB History  No data available    Past Medical History:  Diagnosis Date  . Anxiety   . 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   . Cancer ([place]) 1995   thyroid  . Endometriosis 2009  . Hyperlipidemia   . Hypothyroidism   . Osteopenia    rt hip    Past Surgical History:  Procedure Laterality Date  . ABDOMINAL HYSTERECTOMY  2005   Martin DeFrancesco,MD  . BREAST BIOPSY Right 04-02-13   INVASIVE MAMMARY CARCINOMA WITH FEATURES OF TUBULAR CARCINOMA,  . BREAST SURGERY Right 05/01/13   wide excision  . colectomy  2009   secondary to endometriosis DR. Smith  . 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 No malignancy.  . COLONOSCOPY  2009   Dr. Tamala Julian  . FOOT SURGERY  right    Current Outpatient Prescriptions on File Prior to Visit  Medication Sig Dispense Refill  . acyclovir (ZOVIRAX) 400 MG tablet Twice daily for 5 days 30 tablet 6  . albuterol (PROVENTIL HFA;VENTOLIN HFA) 108 (90 Base) MCG/ACT inhaler Inhale 2 puffs into the lungs every 6 (six) hours as needed for wheezing or shortness of breath. 1 Inhaler  0  . amLODipine (NORVASC) 5 MG tablet Take 1 tablet (5 mg total) by mouth daily. 90 tablet 3  . aspirin 81 MG tablet Take 81 mg by mouth daily.    Marland Kitchen atorvastatin (LIPITOR) 40 MG tablet Take 1 tablet (40 mg total) by mouth daily. 90 tablet 3  . CALCIUM-MAGNESIUM-ZINC PO Take by mouth 3 (three) times daily.    . Cholecalciferol (VITAMIN D-3 PO) Take 1,000 Units by mouth.    . clonazePAM (KLONOPIN) 1 MG tablet Take 1 tablet (1 mg total) by mouth 3 (three) times daily as needed for anxiety. 90 tablet 1  . cyclobenzaprine (FLEXERIL) 10 MG tablet Take 1 tablet (10 mg total) by mouth 3 (three) times daily as needed for muscle spasms. Do not take with klonopin. 30 tablet 0  . escitalopram (LEXAPRO) 20 MG tablet Take 1 tablet (20 mg total) by mouth daily. 90 tablet 3  . GARLIC PO Take 294 mg by mouth.    . levothyroxine (SYNTHROID, LEVOTHROID) 150 MCG tablet TAKE 1 TABLET (125 MCG TOTAL) BY MOUTH DAILY. 90 tablet 1  . losartan (COZAAR) 100 MG tablet Take 1 tablet (100 mg total) by mouth daily. 90 tablet 3  . Multiple Vitamins-Minerals (MULTIVITAMIN WITH MINERALS) tablet Take 1 tablet by mouth daily.    . raloxifene (EVISTA) 60 MG tablet Take 1 tablet (60 mg total) by mouth daily. 90 tablet 1  . vitamin C (ASCORBIC ACID)  500 MG tablet Take 500 mg by mouth daily.    Marland Kitchen zolpidem (AMBIEN) 10 MG tablet TAKE 1 TABLET BY MOUTH AT BEDTIME AS NEEDED 30 tablet 1   No current facility-administered medications on file prior to visit.     Allergies  Allergen Reactions  . Codeine Anaphylaxis  . Ace Inhibitors   . Iodinated Diagnostic Agents Hives  . Penicillins Rash    Social History   Social History  . Marital status: Single    Spouse name: N/A  . Number of children: N/A  . Years of education: N/A   Occupational History  . Not on file.   Social History Main Topics  . Smoking status: Never Smoker  . Smokeless tobacco: Never Used  . Alcohol use 0.0 oz/week     Comment: wine- daily  . Drug use: No   . Sexual activity: Yes    Birth control/ protection: Surgical   Other Topics Concern  . Not on file   Social History Narrative  . No narrative on file    Family History  Problem Relation Age of Onset  . Colon polyps Father        age 70  . Heart disease Father   . Heart disease Mother   . Heart disease Paternal Grandmother   . Heart disease Paternal Grandfather   . Cancer Neg Hx   . Diabetes Neg Hx     The following portions of the patient's history were reviewed and updated as appropriate: allergies, current medications, past family history, past medical history, past social history, past surgical history and problem list.  Review of Systems ROS   Objective:   There were no vitals taken for this visit. CONSTITUTIONAL: Well-developed, well-nourished female in no acute distress.  PSYCHIATRIC: Normal mood and affect. Normal behavior. Normal judgment and thought content. [place]: Alert and oriented to person, place, and time. Normal muscle tone coordination. No cranial nerve deficit noted. HENT:  Normocephalic, atraumatic, External right and left ear normal. Oropharynx is clear and moist EYES: Conjunctivae and EOM are normal. Pupils are equal, round, and reactive to light. No scleral icterus.  NECK: Normal range of motion, supple, no masses.  Normal thyroid.  SKIN: Skin is warm and dry. No rash noted. Not diaphoretic. No erythema. No pallor. CARDIOVASCULAR: Normal heart rate noted, regular rhythm, no murmur. RESPIRATORY: Clear to auscultation bilaterally. Effort and breath sounds normal, no problems with respiration noted. BREASTS: Symmetric in size. No masses, skin changes, nipple drainage, or lymphadenopathy. ABDOMEN: Soft, normal bowel sounds, no distention noted.  No tenderness, rebound or guarding.  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 No tenderness.  No cyanosis, clubbing, or edema.  2+ distal pulses. LYMPHATIC: No Axillary, Supraclavicular, or Inguinal Adenopathy.    Assessment:   Annual gynecologic examination 61 y.o. Contraception: status post hysterectomy [id number]   Plan:  Pap: Due 2020 Mammogram:  Dx bilateral and rt breast u/s Stool Guaiac Testing:  Ordered Labs: thru pcp Routine preventative health maintenance measures emphasized: Exercise/Diet/Weight control, Tobacco Warnings, Alcohol/Substance use risks and Stress Management Return to [place], [place]    Note: This dictation was  prepared with Dragon dictation along with smaller phrase technology. Any transcriptional errors that result from this process are unintentional.

## 2016-07-27 ENCOUNTER — Ambulatory Visit (INDEPENDENT_AMBULATORY_CARE_PROVIDER_SITE_OTHER): Payer: BLUE CROSS/BLUE SHIELD

## 2016-07-27 ENCOUNTER — Encounter: Payer: Self-pay | Admitting: Family Medicine

## 2016-07-27 ENCOUNTER — Ambulatory Visit (INDEPENDENT_AMBULATORY_CARE_PROVIDER_SITE_OTHER): Payer: BLUE CROSS/BLUE SHIELD | Admitting: Family Medicine

## 2016-07-27 VITALS — BP 130/80 | HR 101 | Temp 98.3°F | Wt 162.0 lb

## 2016-07-27 DIAGNOSIS — M25562 Pain in left knee: Secondary | ICD-10-CM

## 2016-07-27 MED ORDER — ZOLPIDEM TARTRATE 10 MG PO TABS: 10.0000 mg | ORAL_TABLET | Freq: Every evening | ORAL | 1 refills | Status: DC | PRN

## 2016-07-27 NOTE — Addendum Note (Signed)
Addended by: Juanda Chance on: 07/27/2016 09:28 AM   Modules accepted: Orders

## 2016-07-27 NOTE — Progress Notes (Signed)
  [name], MD Phone: [phone]  Christie Ramirez is a 62 y.o. female who presents today for same-day visit.  Left knee pain: Fell on 07/16/16. Right onto left knee. Was able to walk afterwards. Notes it became swollen and bruised. The bruising is improved. Swelling is improved. Still has pain over the medial joint line and lateral joint line and just superior and lateral to the patella. History Aleve and ice. Has overall been improving.  ROS see history of present illness  Objective  Physical Exam Vitals:   07/27/16 0902  BP: 130/80  Pulse: (!) 101  Temp: 98.3 F (36.8 C)    BP Readings from Last 3 Encounters:  07/27/16 130/80  07/06/16 130/80  03/07/16 124/68   Wt Readings from Last 3 Encounters:  07/27/16 162 lb (73.5 kg)  07/06/16 160 lb 12.8 oz (72.9 kg)  03/07/16 159 lb (72.1 kg)    Physical Exam  Constitutional: No distress.  Pulmonary/Chest: Effort normal.  Musculoskeletal:  Left knee with mild effusion anteriorly, no warmth or erythema, there is tenderness over the head of the fibula and patella, no ligamentous laxity, negative McMurray's, right knee with no effusion, warmth, erythema, or tenderness, no ligamentous laxity, negative McMurray's  Neurological: She is alert. Gait normal.  Skin: She is not diaphoretic.     Assessment/Plan: Please see individual problem list.  Acute pain of left knee Patient with fall on the left knee. Given areas of tenderness we will perform a x-ray of her knee. If no fracture she'll continue ice and anti-inflammatories. If fracture we'll refer to orthopedics.   Orders Placed This Encounter  Procedures  . DG Knee Complete 4 Views Left    Standing Status:   Future    Standing Expiration Date:   09/27/2017    Order Specific Question:   Reason for Exam (SYMPTOM  OR DIAGNOSIS REQUIRED)    Answer:   left knee pain, s/p fall, TTP over fibular head and patella    Order Specific Question:   Is patient pregnant?    Answer:    No    Order Specific Question:   Preferred imaging location?    Answer:   [facility] Specific Question:   Radiology Contrast Protocol - do NOT remove file path    Answer:   [email or web address]    [name], MD [place]

## 2016-07-27 NOTE — Assessment & Plan Note (Signed)
Patient with fall on the left knee. Given areas of tenderness we will perform a x-ray of her knee. If no fracture she'll continue ice and anti-inflammatories. If fracture we'll refer to orthopedics.

## 2016-07-27 NOTE — Patient Instructions (Signed)
Nice to see you. We'll get an x-ray of your left knee and then determine what to do after that.

## 2016-07-28 ENCOUNTER — Encounter: Payer: Self-pay | Admitting: Family Medicine

## 2016-07-31 ENCOUNTER — Telehealth: Payer: Self-pay | Admitting: Family Medicine

## 2016-07-31 NOTE — Telephone Encounter (Signed)
Please let the patient know that the maximum dosage for ambien for females is 5 mg daily. Please see if she has tried this dosage previously. If she has not I would like to see how she does with this dose. If she has and did not find it beneficial we could try an alternative sleep aid. Thanks.

## 2016-08-01 ENCOUNTER — Encounter: Payer: BLUE CROSS/BLUE SHIELD | Admitting: Obstetrics and Gynecology

## 2016-08-01 MED ORDER — ZOLPIDEM TARTRATE 5 MG PO TABS: 5.0000 mg | ORAL_TABLET | Freq: Every evening | ORAL | 1 refills | Status: DC | PRN

## 2016-08-01 NOTE — Telephone Encounter (Signed)
Left message to return call 

## 2016-08-01 NOTE — Telephone Encounter (Signed)
Please fax

## 2016-08-01 NOTE — Telephone Encounter (Signed)
faxed

## 2016-08-01 NOTE — Addendum Note (Signed)
Addended by: Leone Haven on: 08/01/2016 08:49 AM   Modules accepted: Orders

## 2016-08-01 NOTE — Telephone Encounter (Signed)
Patient states she has not tried it but is ok with changing it to 5 mg

## 2016-08-15 ENCOUNTER — Other Ambulatory Visit: Payer: Self-pay | Admitting: Family Medicine

## 2016-08-17 ENCOUNTER — Other Ambulatory Visit: Payer: Self-pay

## 2016-08-17 MED ORDER — RALOXIFENE HCL 60 MG PO TABS: 60.0000 mg | ORAL_TABLET | Freq: Every day | ORAL | 1 refills | Status: DC

## 2016-08-18 ENCOUNTER — Other Ambulatory Visit: Payer: Self-pay

## 2016-08-18 MED ORDER — AMLODIPINE BESYLATE 5 MG PO TABS: 5.0000 mg | ORAL_TABLET | Freq: Every day | ORAL | 3 refills | Status: DC

## 2016-08-31 ENCOUNTER — Encounter: Payer: BLUE CROSS/BLUE SHIELD | Admitting: Family Medicine

## 2016-08-31 DIAGNOSIS — Z0289 Encounter for other administrative examinations: Secondary | ICD-10-CM

## 2016-09-01 NOTE — Progress Notes (Deleted)
Patient ID: Christie Ramirez, female   DOB: 1955-06-30, 61 y.o.   MRN: [id number] ANNUAL PREVENTATIVE CARE GYN  ENCOUNTER NOTE  Subjective:       Christie Ramirez is a 61 y.o. No obstetric history on file. female here for a routine annual gynecologic exam.  Current complaints: 1.   2. 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  Both reportedly with negative STI history     Gynecologic History No LMP recorded. Patient has had a hysterectomy. Contraception: status post hysterectomy Last Pap: 04/2013 neg/neg. Results were: normal Last mammogram: thru dr brynett. Results were: normal History of endometriosis  Obstetric History OB History  No data available    Past Medical History:  Diagnosis Date  . Anxiety   . 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   . Cancer ([place]) 1995   thyroid  . Endometriosis 2009  . Hyperlipidemia   . Hypothyroidism   . Osteopenia    rt hip    Past Surgical History:  Procedure Laterality Date  . ABDOMINAL HYSTERECTOMY  2005   Martin DeFrancesco,MD  . BREAST BIOPSY Right 04-02-13   INVASIVE MAMMARY CARCINOMA WITH FEATURES OF TUBULAR CARCINOMA,  . BREAST SURGERY Right 05/01/13   wide excision  . colectomy  2009   secondary to endometriosis DR. Smith  . 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 No malignancy.  . COLONOSCOPY  2009   Dr. Tamala Julian  . FOOT SURGERY  right    Current Outpatient Prescriptions on File Prior to Visit  Medication Sig Dispense Refill  . albuterol (PROVENTIL HFA;VENTOLIN HFA) 108 (90 Base) MCG/ACT inhaler Inhale 2 puffs into the lungs every 6 (six) hours as needed for wheezing or shortness of breath. 1 Inhaler 0  . amLODipine  (NORVASC) 5 MG tablet Take 1 tablet (5 mg total) by mouth daily. 90 tablet 3  . aspirin 81 MG tablet Take 81 mg by mouth daily.    Marland Kitchen atorvastatin (LIPITOR) 40 MG tablet Take 1 tablet (40 mg total) by mouth daily. 90 tablet 3  . CALCIUM-MAGNESIUM-ZINC PO Take by mouth 3 (three) times daily.    . Cholecalciferol (VITAMIN D-3 PO) Take 1,000 Units by mouth.    . clonazePAM (KLONOPIN) 1 MG tablet Take 1 tablet (1 mg total) by mouth 3 (three) times daily as needed for anxiety. 90 tablet 1  . escitalopram (LEXAPRO) 20 MG tablet Take 1 tablet (20 mg total) by mouth daily. 90 tablet 3  . GARLIC PO Take 678 mg by mouth.    . levothyroxine (SYNTHROID, LEVOTHROID) 150 MCG tablet TAKE 1 TABLET (125 MCG TOTAL) BY MOUTH DAILY. 90 tablet 1  . losartan (COZAAR) 100 MG tablet Take 1 tablet (100 mg total) by mouth daily. 90 tablet 3  . Multiple Vitamins-Minerals (MULTIVITAMIN WITH MINERALS) tablet Take 1 tablet by mouth daily.    . raloxifene (EVISTA) 60 MG tablet Take 1 tablet (60 mg total) by mouth daily. 90 tablet 1  . vitamin C (ASCORBIC ACID) 500 MG tablet Take 500 mg by mouth daily.    Marland Kitchen zolpidem (AMBIEN) 5 MG tablet Take 1 tablet (5 mg total) by mouth at bedtime as needed. 30 tablet 1   No current facility-administered  medications on file prior to visit.     Allergies  Allergen Reactions  . Codeine Anaphylaxis  . Ace Inhibitors   . Iodinated Diagnostic Agents Hives  . Penicillins Rash    Social History   Social History  . Marital status: Single    Spouse name: N/A  . Number of children: N/A  . Years of education: N/A   Occupational History  . Not on file.   Social History Main Topics  . Smoking status: Never Smoker  . Smokeless tobacco: Never Used  . Alcohol use 0.0 oz/week     Comment: wine- daily  . Drug use: No  . Sexual activity: Yes    Birth control/ protection: Surgical   Other Topics Concern  . Not on file   Social History Narrative  . No narrative on file    Family  History  Problem Relation Age of Onset  . Colon polyps Father        age 65  . Heart disease Father   . Heart disease Mother   . Heart disease Paternal Grandmother   . Heart disease Paternal Grandfather   . Cancer Neg Hx   . Diabetes Neg Hx     The following portions of the patient's history were reviewed and updated as appropriate: allergies, current medications, past family history, past medical history, past social history, past surgical history and problem list.  Review of Systems ROS   Objective:   There were no vitals taken for this visit. CONSTITUTIONAL: Well-developed, well-nourished female in no acute distress.  PSYCHIATRIC: Normal mood and affect. Normal behavior. Normal judgment and thought content. [place]: Alert and oriented to person, place, and time. Normal muscle tone coordination. No cranial nerve deficit noted. HENT:  Normocephalic, atraumatic, External right and left ear normal. Oropharynx is clear and moist EYES: Conjunctivae and EOM are normal. Pupils are equal, round, and reactive to light. No scleral icterus.  NECK: Normal range of motion, supple, no masses.  Normal thyroid.  SKIN: Skin is warm and dry. No rash noted. Not diaphoretic. No erythema. No pallor. CARDIOVASCULAR: Normal heart rate noted, regular rhythm, no murmur. RESPIRATORY: Clear to auscultation bilaterally. Effort and breath sounds normal, no problems with respiration noted. BREASTS: Symmetric in size. No masses, skin changes, nipple drainage, or lymphadenopathy. ABDOMEN: Soft, normal bowel sounds, no distention noted.  No tenderness, rebound or guarding.  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 No tenderness.  No cyanosis, clubbing, or edema.  2+ distal pulses. LYMPHATIC: No Axillary, Supraclavicular, or Inguinal Adenopathy.    Assessment:   Annual gynecologic examination 61 y.o. Contraception: status post hysterectomy bmi-29 Healing vesicles on underside of clitoris, cannot rule out possible herpes infection  Plan:  Pap: pap w/hpv rflx 16/18 Mammogram: thru brynett Stool Guaiac Testing:  Ordered Labs: thru pcp Routine preventative health maintenance measures emphasized: Exercise/Diet/Weight control, Tobacco Warnings, Alcohol/Substance use risks and Stress Management Return to [place], CMA   Note: This dictation was prepared with Dragon dictation along with smaller phrase technology. Any transcriptional errors that result from this process are unintentional.

## 2016-09-08 ENCOUNTER — Encounter: Payer: BLUE CROSS/BLUE SHIELD | Admitting: Obstetrics and Gynecology

## 2016-09-23 ENCOUNTER — Other Ambulatory Visit: Payer: Self-pay | Admitting: Family Medicine

## 2016-09-26 NOTE — Telephone Encounter (Signed)
Pt came into office, she is out of her medication. She needs a refill. Please advise patient.

## 2016-09-26 NOTE — Telephone Encounter (Signed)
Last OV 07/27/2016 Next OV 10/17/2016 Last refill 04/12/2016

## 2016-09-27 ENCOUNTER — Ambulatory Visit: Payer: BLUE CROSS/BLUE SHIELD | Admitting: General Surgery

## 2016-09-27 NOTE — Telephone Encounter (Signed)
Script faxed.

## 2016-10-02 NOTE — Progress Notes (Deleted)
Patient ID: Christie Ramirez, female   DOB: 04/20/1955, 61 y.o.   MRN: [id number] ANNUAL PREVENTATIVE CARE GYN  ENCOUNTER NOTE  Subjective:       Christie Ramirez is a 61 y.o. No obstetric history on file. female here for a routine annual gynecologic exam.  Current complaints: 1.   2. 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  Both reportedly with negative STI history     Gynecologic History No LMP recorded. Patient has had a hysterectomy. Contraception: status post hysterectomy Last Pap: 04/2013 neg/neg. Results were: normal Last mammogram: thru dr brynett. Results were: normal History of endometriosis  Obstetric History OB History  No data available    Past Medical History:  Diagnosis Date  . Anxiety   . 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   . Cancer ([place]) 1995   thyroid  . Endometriosis 2009  . Hyperlipidemia   . Hypothyroidism   . Osteopenia    rt hip    Past Surgical History:  Procedure Laterality Date  . ABDOMINAL HYSTERECTOMY  2005   Martin DeFrancesco,MD  . BREAST BIOPSY Right 04-02-13   INVASIVE MAMMARY CARCINOMA WITH FEATURES OF TUBULAR CARCINOMA,  . BREAST SURGERY Right 05/01/13   wide excision  . colectomy  2009   secondary to endometriosis DR. Smith  . 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 No malignancy.  . COLONOSCOPY  2009   Dr. Tamala Julian  . FOOT SURGERY  right    Current Outpatient Prescriptions on File Prior to Visit  Medication Sig Dispense Refill  . albuterol (PROVENTIL HFA;VENTOLIN HFA) 108 (90 Base) MCG/ACT inhaler Inhale 2 puffs into the lungs every 6 (six) hours as needed for wheezing or shortness of breath. 1 Inhaler 0  . amLODipine  (NORVASC) 5 MG tablet Take 1 tablet (5 mg total) by mouth daily. 90 tablet 3  . aspirin 81 MG tablet Take 81 mg by mouth daily.    Marland Kitchen atorvastatin (LIPITOR) 40 MG tablet Take 1 tablet (40 mg total) by mouth daily. 90 tablet 3  . CALCIUM-MAGNESIUM-ZINC PO Take by mouth 3 (three) times daily.    . Cholecalciferol (VITAMIN D-3 PO) Take 1,000 Units by mouth.    . clonazePAM (KLONOPIN) 1 MG tablet TAKE 1 TABLET BY MOUTH 3 TIMES A DAY AS NEEDED ANXIETY 90 tablet 1  . escitalopram (LEXAPRO) 20 MG tablet Take 1 tablet (20 mg total) by mouth daily. 90 tablet 3  . GARLIC PO Take 557 mg by mouth.    . levothyroxine (SYNTHROID, LEVOTHROID) 150 MCG tablet TAKE 1 TABLET (125 MCG TOTAL) BY MOUTH DAILY. 90 tablet 1  . losartan (COZAAR) 100 MG tablet Take 1 tablet (100 mg total) by mouth daily. 90 tablet 3  . Multiple Vitamins-Minerals (MULTIVITAMIN WITH MINERALS) tablet Take 1 tablet by mouth daily.    . raloxifene (EVISTA) 60 MG tablet Take 1 tablet (60 mg total) by mouth daily. 90 tablet 1  . vitamin C (ASCORBIC ACID) 500 MG tablet Take 500 mg by mouth daily.    Marland Kitchen zolpidem (AMBIEN) 5 MG tablet Take 1 tablet (5 mg total) by mouth at bedtime as needed. 30 tablet 1   No current facility-administered medications on file prior  to visit.     Allergies  Allergen Reactions  . Codeine Anaphylaxis  . Ace Inhibitors   . Iodinated Diagnostic Agents Hives  . Penicillins Rash    Social History   Social History  . Marital status: Single    Spouse name: N/A  . Number of children: N/A  . Years of education: N/A   Occupational History  . Not on file.   Social History Main Topics  . Smoking status: Never Smoker  . Smokeless tobacco: Never Used  . Alcohol use 0.0 oz/week     Comment: wine- daily  . Drug use: No  . Sexual activity: Yes    Birth control/ protection: Surgical   Other Topics Concern  . Not on file   Social History Narrative  . No narrative on file    Family History  Problem Relation  Age of Onset  . Colon polyps Father        age 36  . Heart disease Father   . Heart disease Mother   . Heart disease Paternal Grandmother   . Heart disease Paternal Grandfather   . Cancer Neg Hx   . Diabetes Neg Hx     The following portions of the patient's history were reviewed and updated as appropriate: allergies, current medications, past family history, past medical history, past social history, past surgical history and problem list.  Review of Systems ROS   Objective:   There were no vitals taken for this visit. CONSTITUTIONAL: Well-developed, well-nourished female in no acute distress.  PSYCHIATRIC: Normal mood and affect. Normal behavior. Normal judgment and thought content. [place]: Alert and oriented to person, place, and time. Normal muscle tone coordination. No cranial nerve deficit noted. HENT:  Normocephalic, atraumatic, External right and left ear normal. Oropharynx is clear and moist EYES: Conjunctivae and EOM are normal. Pupils are equal, round, and reactive to light. No scleral icterus.  NECK: Normal range of motion, supple, no masses.  Normal thyroid.  SKIN: Skin is warm and dry. No rash noted. Not diaphoretic. No erythema. No pallor. CARDIOVASCULAR: Normal heart rate noted, regular rhythm, no murmur. RESPIRATORY: Clear to auscultation bilaterally. Effort and breath sounds normal, no problems with respiration noted. BREASTS: Symmetric in size. No masses, skin changes, nipple drainage, or lymphadenopathy. ABDOMEN: Soft, normal bowel sounds, no distention noted.  No tenderness, rebound or guarding.  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 No tenderness.  No cyanosis, clubbing, or edema.  2+ distal pulses. LYMPHATIC: No Axillary, Supraclavicular, or Inguinal Adenopathy.    Assessment:   Annual gynecologic examination 61 y.o. Contraception: status post hysterectomy bmi-29   Plan:  Pap: pap w/hpv rflx 16/18 Mammogram: thru brynett Stool Guaiac Testing:  Ordered Labs: thru pcp Routine preventative health maintenance measures emphasized: Exercise/Diet/Weight control, Tobacco Warnings, Alcohol/Substance use risks and Stress Management Return to [place], CMA   Note: This dictation was prepared with Dragon dictation along with smaller phrase technology. Any transcriptional errors that result from this process are unintentional.

## 2016-10-03 ENCOUNTER — Other Ambulatory Visit: Payer: Self-pay | Admitting: Internal Medicine

## 2016-10-04 ENCOUNTER — Encounter: Payer: BLUE CROSS/BLUE SHIELD | Admitting: Obstetrics and Gynecology

## 2016-10-06 NOTE — Progress Notes (Deleted)
Patient ID: Christie Ramirez, female   DOB: [birth year], 61 y.o.   MRN: [id number] ANNUAL PREVENTATIVE CARE GYN  ENCOUNTER NOTE  Subjective:       Christie Ramirez is a 61 y.o. No obstetric history on file. female here for a routine annual gynecologic exam.  Current complaints: 1.   2. 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  Both reportedly with negative STI history     Gynecologic History No LMP recorded. Patient has had a hysterectomy. Contraception: status post hysterectomy Last Pap: 04/2013 neg/neg. Results were: normal Last mammogram: thru dr brynett. Results were: normal History of endometriosis  Obstetric History OB History  No data available    Past Medical History:  Diagnosis Date  . Anxiety   . 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   . Cancer ([place]) 1995   thyroid  . Endometriosis 2009  . Hyperlipidemia   . Hypothyroidism   . Osteopenia    rt hip    Past Surgical History:  Procedure Laterality Date  . ABDOMINAL HYSTERECTOMY  2005   Martin DeFrancesco,MD  . BREAST BIOPSY Right 04-02-13   INVASIVE MAMMARY CARCINOMA WITH FEATURES OF TUBULAR CARCINOMA,  . BREAST SURGERY Right 05/01/13   wide excision  . colectomy  2009   secondary to endometriosis DR. Smith  . 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 No malignancy.  . COLONOSCOPY  2009   Dr. Tamala Julian  . FOOT SURGERY  right    Current Outpatient Prescriptions on File Prior to Visit  Medication Sig Dispense Refill  . albuterol (PROVENTIL HFA;VENTOLIN HFA) 108 (90 Base) MCG/ACT inhaler Inhale 2 puffs into the lungs every 6 (six) hours as needed for wheezing or shortness of breath. 1 Inhaler 0  . amLODipine  (NORVASC) 5 MG tablet Take 1 tablet (5 mg total) by mouth daily. 90 tablet 3  . aspirin 81 MG tablet Take 81 mg by mouth daily.    Marland Kitchen atorvastatin (LIPITOR) 40 MG tablet TAKE 1 TABLET (40 MG TOTAL) BY MOUTH DAILY. 90 tablet 2  . CALCIUM-MAGNESIUM-ZINC PO Take by mouth 3 (three) times daily.    . Cholecalciferol (VITAMIN D-3 PO) Take 1,000 Units by mouth.    . clonazePAM (KLONOPIN) 1 MG tablet TAKE 1 TABLET BY MOUTH 3 TIMES A DAY AS NEEDED ANXIETY 90 tablet 1  . escitalopram (LEXAPRO) 20 MG tablet Take 1 tablet (20 mg total) by mouth daily. 90 tablet 3  . GARLIC PO Take 932 mg by mouth.    . levothyroxine (SYNTHROID, LEVOTHROID) 150 MCG tablet TAKE 1 TABLET (125 MCG TOTAL) BY MOUTH DAILY. 90 tablet 1  . losartan (COZAAR) 100 MG tablet Take 1 tablet (100 mg total) by mouth daily. 90 tablet 3  . Multiple Vitamins-Minerals (MULTIVITAMIN WITH MINERALS) tablet Take 1 tablet by mouth daily.    . raloxifene (EVISTA) 60 MG tablet Take 1 tablet (60 mg total) by mouth daily. 90 tablet 1  . vitamin C (ASCORBIC ACID) 500 MG tablet Take 500 mg by mouth daily.    Marland Kitchen zolpidem (AMBIEN) 5 MG tablet Take 1 tablet (5 mg total) by mouth at bedtime as needed. 30 tablet 1   No current facility-administered medications on file prior  to visit.     Allergies  Allergen Reactions  . Codeine Anaphylaxis  . Ace Inhibitors   . Iodinated Diagnostic Agents Hives  . Penicillins Rash    Social History   Social History  . Marital status: Single    Spouse name: N/A  . Number of children: N/A  . Years of education: N/A   Occupational History  . Not on file.   Social History Main Topics  . Smoking status: Never Smoker  . Smokeless tobacco: Never Used  . Alcohol use 0.0 oz/week     Comment: wine- daily  . Drug use: No  . Sexual activity: Yes    Birth control/ protection: Surgical   Other Topics Concern  . Not on file   Social History Narrative  . No narrative on file    Family History  Problem Relation  Age of Onset  . Colon polyps Father        age 60  . Heart disease Father   . Heart disease Mother   . Heart disease Paternal Grandmother   . Heart disease Paternal Grandfather   . Cancer Neg Hx   . Diabetes Neg Hx     The following portions of the patient's history were reviewed and updated as appropriate: allergies, current medications, past family history, past medical history, past social history, past surgical history and problem list.  Review of Systems ROS   Objective:   There were no vitals taken for this visit. CONSTITUTIONAL: Well-developed, well-nourished female in no acute distress.  PSYCHIATRIC: Normal mood and affect. Normal behavior. Normal judgment and thought content. [place]: Alert and oriented to person, place, and time. Normal muscle tone coordination. No cranial nerve deficit noted. HENT:  Normocephalic, atraumatic, External right and left ear normal. Oropharynx is clear and moist EYES: Conjunctivae and EOM are normal. Pupils are equal, round, and reactive to light. No scleral icterus.  NECK: Normal range of motion, supple, no masses.  Normal thyroid.  SKIN: Skin is warm and dry. No rash noted. Not diaphoretic. No erythema. No pallor. CARDIOVASCULAR: Normal heart rate noted, regular rhythm, no murmur. RESPIRATORY: Clear to auscultation bilaterally. Effort and breath sounds normal, no problems with respiration noted. BREASTS: Symmetric in size. No masses, skin changes, nipple drainage, or lymphadenopathy. ABDOMEN: Soft, normal bowel sounds, no distention noted.  No tenderness, rebound or guarding.  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 No tenderness.  No cyanosis, clubbing, or edema.  2+ distal pulses. LYMPHATIC: No Axillary, Supraclavicular, or Inguinal Adenopathy.    Assessment:   Annual gynecologic examination 61 y.o. Contraception: status post hysterectomy bmi-29   Plan:  Pap: pap w/hpv rflx 16/18 Mammogram: thru brynett Stool Guaiac Testing:  Ordered Labs: thru pcp Routine preventative health maintenance measures emphasized: Exercise/Diet/Weight control, Tobacco Warnings, Alcohol/Substance use risks and Stress Management Return to [place], CMA   Note: This dictation was prepared with Dragon dictation along with smaller phrase technology. Any transcriptional errors that result from this process are unintentional.

## 2016-10-13 ENCOUNTER — Emergency Department
Admission: EM | Admit: 2016-10-13 | Discharge: 2016-10-13 | Disposition: A | Discharge status: Home / Self Care | Payer: BLUE CROSS/BLUE SHIELD | Attending: Emergency Medicine | Admitting: Emergency Medicine

## 2016-10-13 ENCOUNTER — Encounter: Payer: BLUE CROSS/BLUE SHIELD | Admitting: Obstetrics and Gynecology

## 2016-10-13 ENCOUNTER — Ambulatory Visit (INDEPENDENT_AMBULATORY_CARE_PROVIDER_SITE_OTHER): Payer: BLUE CROSS/BLUE SHIELD | Admitting: Primary Care

## 2016-10-13 ENCOUNTER — Emergency Department: Payer: BLUE CROSS/BLUE SHIELD

## 2016-10-13 ENCOUNTER — Encounter: Payer: Self-pay | Admitting: Primary Care

## 2016-10-13 ENCOUNTER — Encounter: Payer: Self-pay | Admitting: Emergency Medicine

## 2016-10-13 VITALS — BP 140/82 | HR 120 | Temp 98.6°F | Wt 163.8 lb

## 2016-10-13 DIAGNOSIS — Z79899 Other long term (current) drug therapy: Secondary | ICD-10-CM | POA: Diagnosis not present

## 2016-10-13 DIAGNOSIS — I10 Essential (primary) hypertension: Secondary | ICD-10-CM | POA: Diagnosis not present

## 2016-10-13 DIAGNOSIS — J069 Acute upper respiratory infection, unspecified: Secondary | ICD-10-CM

## 2016-10-13 DIAGNOSIS — J029 Acute pharyngitis, unspecified: Secondary | ICD-10-CM | POA: Diagnosis not present

## 2016-10-13 DIAGNOSIS — R0602 Shortness of breath: Secondary | ICD-10-CM | POA: Diagnosis present

## 2016-10-13 DIAGNOSIS — Z853 Personal history of malignant neoplasm of breast: Secondary | ICD-10-CM | POA: Diagnosis not present

## 2016-10-13 DIAGNOSIS — Z7982 Long term (current) use of aspirin: Secondary | ICD-10-CM | POA: Insufficient documentation

## 2016-10-13 DIAGNOSIS — E039 Hypothyroidism, unspecified: Secondary | ICD-10-CM | POA: Insufficient documentation

## 2016-10-13 DIAGNOSIS — J189 Pneumonia, unspecified organism: Secondary | ICD-10-CM | POA: Insufficient documentation

## 2016-10-13 DIAGNOSIS — R079 Chest pain, unspecified: Secondary | ICD-10-CM | POA: Diagnosis not present

## 2016-10-13 LAB — CBC WITH DIFFERENTIAL/PLATELET
BASOS PCT: 0 %
Basophils Absolute: 0 10*3/uL (ref 0–0.1)
Eosinophils Absolute: 0 10*3/uL (ref 0–0.7)
Eosinophils Relative: 0 %
HEMATOCRIT: 35.5 % (ref 35.0–47.0)
HEMOGLOBIN: 12.1 g/dL (ref 12.0–16.0)
LYMPHS ABS: 1.5 10*3/uL (ref 1.0–3.6)
LYMPHS PCT: 11 %
MCH: 30.1 pg (ref 26.0–34.0)
MCHC: 34.1 g/dL (ref 32.0–36.0)
MCV: 88.3 fL (ref 80.0–100.0)
MONO ABS: 1.6 10*3/uL — AB (ref 0.2–0.9)
MONOS PCT: 12 %
NEUTROS ABS: 10.9 10*3/uL — AB (ref 1.4–6.5)
NEUTROS PCT: 77 %
Platelets: 299 10*3/uL (ref 150–440)
RBC: 4.02 MIL/uL (ref 3.80–5.20)
RDW: 13.6 % (ref 11.5–14.5)
WBC: 14 10*3/uL — ABNORMAL HIGH (ref 3.6–11.0)

## 2016-10-13 LAB — COMPREHENSIVE METABOLIC PANEL
ALK PHOS: 90 U/L (ref 38–126)
ALT: 21 U/L (ref 14–54)
AST: 23 U/L (ref 15–41)
Albumin: 4 g/dL (ref 3.5–5.0)
Anion gap: 13 (ref 5–15)
BUN: 10 mg/dL (ref 6–20)
CALCIUM: 9.2 mg/dL (ref 8.9–10.3)
CHLORIDE: 93 mmol/L — AB (ref 101–111)
CO2: 25 mmol/L (ref 22–32)
Creatinine, Ser: 0.86 mg/dL (ref 0.44–1.00)
GFR calc Af Amer: >60 mL/min (ref >60)
GFR calc non Af Amer: >60 mL/min (ref >60)
GLUCOSE: 128 mg/dL — AB (ref 65–99)
Potassium: 3.8 mmol/L (ref 3.5–5.1)
SODIUM: 131 mmol/L — AB (ref 135–145)
Total Bilirubin: 1 mg/dL (ref 0.3–1.2)
Total Protein: 7.7 g/dL (ref 6.5–8.1)

## 2016-10-13 LAB — POCT RAPID STREP A (OFFICE): RAPID STREP A SCREEN: NEGATIVE

## 2016-10-13 MED ORDER — PSEUDOEPH-BROMPHEN-DM 30-2-10 MG/5ML PO SYRP: 5.0000 mL | ORAL_SOLUTION | Freq: Three times a day (TID) | ORAL | 0 refills | Status: DC | PRN

## 2016-10-13 MED ORDER — ALBUTEROL SULFATE HFA 108 (90 BASE) MCG/ACT IN AERS: 2.0000 | INHALATION_SPRAY | Freq: Four times a day (QID) | RESPIRATORY_TRACT | 0 refills | Status: DC | PRN

## 2016-10-13 MED ORDER — IBUPROFEN 100 MG/5ML PO SUSP
600.0000 mg | Freq: Once | ORAL | Status: DC
  Filled 2016-10-13: qty 30

## 2016-10-13 MED ORDER — LEVOFLOXACIN 750 MG PO TABS
750.0000 mg | ORAL_TABLET | Freq: Once | ORAL | Status: AC
  Administered 2016-10-13: 750 mg via ORAL
  Filled 2016-10-13: qty 1

## 2016-10-13 MED ORDER — ACETAMINOPHEN 160 MG/5ML PO SOLN
1000.0000 mg | Freq: Once | ORAL | Status: AC
  Administered 2016-10-13: 1000 mg via ORAL
  Filled 2016-10-13 (×2): qty 40

## 2016-10-13 MED ORDER — SODIUM CHLORIDE 0.9 % IV BOLUS (SEPSIS)
1000.0000 mL | Freq: Once | INTRAVENOUS | Status: AC
  Administered 2016-10-13: 1000 mL via INTRAVENOUS

## 2016-10-13 MED ORDER — LEVOFLOXACIN 750 MG PO TABS: 750.0000 mg | ORAL_TABLET | Freq: Every day | ORAL | 0 refills | Status: AC

## 2016-10-13 NOTE — Addendum Note (Signed)
Addended by: Jacqualin Combes on: 10/13/2016 09:35 AM   Modules accepted: Orders

## 2016-10-13 NOTE — Progress Notes (Addendum)
Subjective:    Patient ID: Christie Ramirez, female    DOB: [birth year], 61 y.o.   MRN: [id number]  HPI  Christie Ramirez is a 61 year old female with a history of chronic bronchitis, OSA, hypertension who presents today with a chief complaint of sore throat. Christie Ramirez also reports chills, ear pain, mild cough, wheezing. Her symptoms began yesterday morning and have progressed since. Her ear pain is located to the left ear which began this morning. Christie Ramirez's taken Ibuprofen, Vicks, and is gargling salt water with some improvement. Christie Ramirez had a low grade fever of 99.8 last night.   Review of Systems  Constitutional: Positive for chills and fever.  HENT: Positive for congestion, ear pain and sore throat. Negative for sinus pressure.   Respiratory: Positive for cough.   Neurological: Positive for headaches.       Past Medical History:  Diagnosis Date  . Anxiety   . Breast cancer of upper-inner quadrant of right female breast (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   . Cancer ([place]) 1995   thyroid  . Endometriosis 2009  . Hyperlipidemia   . Hypothyroidism   . Osteopenia    rt hip     Social History   Social History  . Marital status: Single    Spouse name: N/A  . Number of children: N/A  . Years of education: N/A   Occupational History  . Not on file.   Social History Main Topics  . Smoking status: Never Smoker  . Smokeless tobacco: Never Used  . Alcohol use 0.0 oz/week     Comment: wine- daily  . Drug use: No  . Sexual activity: Yes    Birth control/ protection: Surgical   Other Topics Concern  . Not on file   Social History Narrative  . No narrative on file    Past Surgical History:  Procedure Laterality Date  . ABDOMINAL HYSTERECTOMY  2005   Martin DeFrancesco,MD  . BREAST BIOPSY Right 04-02-13   INVASIVE MAMMARY CARCINOMA WITH FEATURES OF TUBULAR CARCINOMA,  . BREAST SURGERY Right 05/01/13   wide excision  . colectomy   2009   secondary to endometriosis DR. Smith  . 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 No malignancy.  . COLONOSCOPY  2009   Dr. Tamala Julian  . FOOT SURGERY  right    Family History  Problem Relation Age of Onset  . Colon polyps Father        age 53  . Heart disease Father   . Heart disease Mother   . Heart disease Paternal Grandmother   . Heart disease Paternal Grandfather   . Cancer Neg Hx   . Diabetes Neg Hx     Allergies  Allergen Reactions  . Codeine Anaphylaxis  . Ace Inhibitors   . Iodinated Diagnostic Agents Hives  . Penicillins Rash    Current Outpatient Prescriptions on File Prior to Visit  Medication Sig Dispense Refill  . albuterol (PROVENTIL HFA;VENTOLIN HFA) 108 (90 Base) MCG/ACT inhaler Inhale 2 puffs into the lungs every 6 (six) hours as needed for wheezing or shortness of breath. 1 Inhaler 0  . amLODipine (NORVASC) 5 MG tablet Take 1 tablet (5 mg total) by mouth daily. 90 tablet 3  . aspirin 81 MG tablet Take 81 mg by mouth daily.    Marland Kitchen atorvastatin (LIPITOR) 40 MG tablet TAKE 1 TABLET (40 MG TOTAL) BY  MOUTH DAILY. 90 tablet 2  . CALCIUM-MAGNESIUM-ZINC PO Take by mouth 3 (three) times daily.    . Cholecalciferol (VITAMIN D-3 PO) Take 1,000 Units by mouth.    . clonazePAM (KLONOPIN) 1 MG tablet TAKE 1 TABLET BY MOUTH 3 TIMES A DAY AS NEEDED ANXIETY 90 tablet 1  . escitalopram (LEXAPRO) 20 MG tablet Take 1 tablet (20 mg total) by mouth daily. 90 tablet 3  . GARLIC PO Take 751 mg by mouth.    . levothyroxine (SYNTHROID, LEVOTHROID) 150 MCG tablet TAKE 1 TABLET (125 MCG TOTAL) BY MOUTH DAILY. 90 tablet 1  . losartan (COZAAR) 100 MG tablet Take 1 tablet (100 mg total) by mouth daily. 90 tablet 3  . Multiple Vitamins-Minerals (MULTIVITAMIN WITH MINERALS) tablet Take 1 tablet by mouth daily.    . raloxifene (EVISTA) 60 MG tablet Take 1 tablet (60 mg total) by mouth daily. 90 tablet 1  . vitamin C (ASCORBIC ACID) 500  MG tablet Take 500 mg by mouth daily.    Marland Kitchen zolpidem (AMBIEN) 5 MG tablet Take 1 tablet (5 mg total) by mouth at bedtime as needed. 30 tablet 1   No current facility-administered medications on file prior to visit.     BP 140/82   Pulse (!) 120   Temp 98.6 F (37 C) (Oral)   Wt 163 lb 12.8 oz (74.3 kg)   SpO2 96%   BMI 30.95 kg/m    Objective:   Physical Exam  Constitutional: Christie Ramirez appears well-nourished. Christie Ramirez does not have a sickly appearance. Christie Ramirez appears ill.  HENT:  Right Ear: Tympanic membrane and ear canal normal.  Left Ear: Tympanic membrane and ear canal normal.  Nose: Right sinus exhibits no maxillary sinus tenderness and no frontal sinus tenderness. Left sinus exhibits no maxillary sinus tenderness and no frontal sinus tenderness.  Mouth/Throat: Oropharynx is clear and moist.  Eyes: Conjunctivae are normal.  Neck: Neck supple.  Cardiovascular: Regular rhythm.   Sinus tachycardia  Pulmonary/Chest: Effort normal. Christie Ramirez has no wheezes. Christie Ramirez has rhonchi in the right upper field, the right lower field, the left upper field and the left lower field. Christie Ramirez has no rales.  Mild rhonchi throughout  Lymphadenopathy:    Christie Ramirez has no cervical adenopathy.  Skin: Skin is warm and dry.          Assessment & Plan:  URI:  Sore throat, cough, fatigue, low grade fever x 24 hours. Exam today with mild rhonchi throughout, does have history of chronic bronchitis; no crackers or diminished sounds. Last dose of Ibuprofen was last night, afebrile today; tachycardic today, has not had morning meds. Rapid strep: Negative Do suspect viral involvement given examination/presentation, duration of symptoms. Rx for Bromphed DM and refill of albuterol inhaler provided. Discussed use of Ibuprofen/tylenol PRN. Fluids, rest. Christie Ramirez will closely follow up with PCP if symptoms do not improve by mid next week, sooner if wheezing progresses.  Sheral Flow, NP

## 2016-10-13 NOTE — Patient Instructions (Signed)
You may take the cough medication every 8 hours as needed for cough and congestion.  Increase Ibuprofen to 800 mg three times daily as needed. Continue warm salt gargles. Consider throat lozenges.  Follow up with Dr. Caryl Bis if no improvement in 1 week or sooner if wheezing progresses.  It was a pleasure meeting you!

## 2016-10-13 NOTE — ED Provider Notes (Signed)
[facility] Emergency Department Provider Note  ____________________________________________   First MD Initiated Contact with Patient 10/13/16 1827     (approximate)  I have reviewed the triage vital signs and the nursing notes.   HISTORY  Chief Complaint Sore Throat and Shortness of Breath   HPI Christie Ramirez is a 61 y.o. female who self presents to the emergency department with 1 day of sore throat shortness of breath and slightly productive cough for the past 24 hours. Today she went to her primary care provider who felt she had a viral syndrome and discharged her home without antibiotics. The patient comes to the emergency department because she feels some difficulty swallowing. She's had subjective fever at home. She did not could've flu shot this year. She has no sick contacts. She is able to swallow liquids without difficulty but solids have been difficult today. She has had no recent dental work.Her symptoms began insidiously and has been slowly progressive.   Past Medical History:  Diagnosis Date  . Anxiety   . 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   . Cancer ([place]) 1995   thyroid  . Endometriosis 2009  . Hyperlipidemia   . Hypothyroidism   . Osteopenia    rt hip    Patient Active Problem List   Diagnosis Date Noted  . Acute pain of left knee 07/27/2016  . Acute pain of right shoulder 07/06/2016  . Fibula fracture 11/11/2015  . Bronchitis 10/19/2015  . Fatigue 10/01/2015  . Osteopenia 08/11/2015  . Chronic bronchitis ([place]) 01/29/2015  . History of herpes genitalis 09/23/2014  . Endometriosis 09/23/2014  . OSA (obstructive sleep apnea) 09/19/2013  . Insomnia 06/09/2013  . Breast cancer of upper-inner quadrant of right female breast ([place]) 04/08/2013  . Hyperlipidemia LDL goal < 100 06/26/2011  . Hypertension 05/18/2011    . Anxiety 05/18/2011  . Hypothyroidism 10/12/2010  . Depression 10/12/2010    Past Surgical History:  Procedure Laterality Date  . ABDOMINAL HYSTERECTOMY  2005   Martin DeFrancesco,MD  . BREAST BIOPSY Right 04-02-13   INVASIVE MAMMARY CARCINOMA WITH FEATURES OF TUBULAR CARCINOMA,  . BREAST SURGERY Right 05/01/13   wide excision  . colectomy  2009   secondary to endometriosis DR. Smith  . 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 No malignancy.  . COLONOSCOPY  2009   Dr. Tamala Julian  . FOOT SURGERY  right    Prior to Admission medications   Medication Sig Start Date End Date Taking? Authorizing Provider  albuterol (PROVENTIL HFA;VENTOLIN HFA) 108 (90 Base) MCG/ACT inhaler Inhale 2 puffs into the lungs every 6 (six) hours as needed for wheezing or shortness of breath. 10/13/16   Pleas Koch, NP  amLODipine (NORVASC) 5 MG tablet Take 1 tablet (5 mg total) by mouth daily. 08/18/16   Leone Haven, MD  aspirin 81 MG tablet Take 81 mg by mouth daily.    [provider]  atorvastatin (LIPITOR) 40 MG tablet TAKE 1 TABLET (40 MG TOTAL) BY MOUTH DAILY. 10/03/16   Crecencio Mc, MD  brompheniramine-pseudoephedrine-DM 30-2-10 MG/5ML syrup Take 5 mLs by mouth 3 (three) times daily as needed. 10/13/16   Pleas Koch, NP  CALCIUM-MAGNESIUM-ZINC PO Take by mouth 3 (three) times daily.    [provider]  Cholecalciferol (VITAMIN D-3 PO) Take 1,000 Units by  mouth.    [provider]  clonazePAM (KLONOPIN) 1 MG tablet TAKE 1 TABLET BY MOUTH 3 TIMES A DAY AS NEEDED ANXIETY 09/27/16   Thersa Salt G, DO  escitalopram (LEXAPRO) 20 MG tablet Take 1 tablet (20 mg total) by mouth daily. 08/11/15   Jackolyn Confer, MD  GARLIC PO Take 017 mg by mouth.    [provider]  levofloxacin (LEVAQUIN) 750 MG tablet Take 1 tablet (750 mg total) by mouth daily. 10/13/16 10/20/16  Darel Hong, MD  levothyroxine  (SYNTHROID, LEVOTHROID) 150 MCG tablet TAKE 1 TABLET (125 MCG TOTAL) BY MOUTH DAILY. 08/15/16   Leone Haven, MD  losartan (COZAAR) 100 MG tablet Take 1 tablet (100 mg total) by mouth daily. 08/11/15   Jackolyn Confer, MD  Multiple Vitamins-Minerals (MULTIVITAMIN WITH MINERALS) tablet Take 1 tablet by mouth daily.    [provider]  raloxifene (EVISTA) 60 MG tablet Take 1 tablet (60 mg total) by mouth daily. 08/17/16   Defrancesco, Alanda Slim, MD  vitamin C (ASCORBIC ACID) 500 MG tablet Take 500 mg by mouth daily.    [provider]  zolpidem (AMBIEN) 5 MG tablet Take 1 tablet (5 mg total) by mouth at bedtime as needed. 08/01/16   Leone Haven, MD    Allergies Codeine; Ace inhibitors; Iodinated diagnostic agents; and Penicillins  Family History  Problem Relation Age of Onset  . Colon polyps Father        age 3  . Heart disease Father   . Heart disease Mother   . Heart disease Paternal Grandmother   . Heart disease Paternal Grandfather   . Cancer Neg Hx   . Diabetes Neg Hx     Social History Social History  Substance Use Topics  . Smoking status: Never Smoker  . Smokeless tobacco: Never Used  . Alcohol use 0.0 oz/week     Comment: wine- daily    Review of Systems Constitutional: Positive fever Eyes: No visual changes. ENT: Positive sore throat. Cardiovascular: Positive chest pain. Respiratory: Positive shortness of breath. Gastrointestinal: No abdominal pain.  No nausea, no vomiting.  No diarrhea.  No constipation. Genitourinary: Negative for dysuria. Musculoskeletal: Negative for back pain. Skin: Negative for rash. Neurological: Negative for headaches, focal weakness or numbness.   ____________________________________________   PHYSICAL EXAM:  VITAL SIGNS: ED Triage Vitals  Enc Vitals Group     BP 10/13/16 1809 139/87     Pulse Rate 10/13/16 1809 (!) 118     Resp 10/13/16 1809 18     Temp 10/13/16 1809 99.4 F (37.4 C)     Temp  Source 10/13/16 1809 Oral     SpO2 10/13/16 1809 95 %     Weight 10/13/16 1810 163 lb (73.9 kg)     Height 10/13/16 1810 5\' 1"  (1.549 m)     Head Circumference --      Peak Flow --      Pain Score 10/13/16 1809 10     Pain Loc --      Pain Edu? --      Excl. in [place]? --     Constitutional: Alert and oriented 4 speaks in somewhat hoarse voice but not hot potato and not drooling no respiratory distress Eyes: PERRL EOMI. Head: Atraumatic. Nose: No congestion/rhinnorhea. Mouth/Throat: No trismus uvula midline no pharyngeal erythema or exudate no pooling secretions and not drooling Neck: No stridor.   Cardiovascular: Tachycardic rate, regular rhythm. Grossly normal heart sounds.  Good  peripheral circulation. Respiratory: Slightly increased respiratory rate with rhonchorous breath sounds bilaterally although moving good air Gastrointestinal: Soft nontender Musculoskeletal: No lower extremity edema   Neurologic:  No gross focal neurologic deficits are appreciated. Skin:  Skin is warm, dry and intact. No rash noted. Psychiatric: Mood and affect are normal.  behavior is normal.    ____________________________________________   DIFFERENTIAL includes but not limited to  Pneumonia, strep pharyngitis, viral pharyngitis, bronchitis, pulmonary embolism, Ludwig's angina, retropharyngeal abscess ____________________________________________   LABS (all labs ordered are listed, but only abnormal results are displayed)  Labs Reviewed  COMPREHENSIVE METABOLIC PANEL - Abnormal; Notable for the following:       Result Value   Sodium 131 (*)    Chloride 93 (*)    Glucose, Bld 128 (*)    All other components within normal limits  CBC WITH DIFFERENTIAL/PLATELET - Abnormal; Notable for the following:    WBC 14.0 (*)    Neutro Abs 10.9 (*)    Monocytes Absolute 1.6 (*)    All other components within normal limits    Blood work reviewed and interpreted by me shows an elevated white count which  is nonspecific and electrolytes are essentially within normal limits __________________________________________  EKG  ED ECG REPORT I, Darel Hong, the attending physician, personally viewed and interpreted this ECG.  Date: 10/13/2016 EKG Time:  Rate: 115 Rhythm: Sinus tachycardia QRS Axis: normal Intervals: normal ST/T Wave abnormalities: normal Narrative Interpretation: no evidence of acute ischemia  ____________________________________________  RADIOLOGY  Chest x-ray with no acute disease ____________________________________________   PROCEDURES  Procedure(s) performed: no  Procedures  Critical Care performed: no  Observation: no ____________________________________________   INITIAL IMPRESSION / ASSESSMENT AND PLAN / ED COURSE  Pertinent labs & imaging results that were available during my care of the patient were reviewed by me and considered in my medical decision making (see chart for details).  The patient arrives tachycardic with a low-grade fever and a white sounding cough along with rhonchorous breath sounds. She clearly does not have a deep space infection and is tolerating her secretions without difficulty. I believe her infection is most likely in her lungs and not actually in her throat. Chest x-ray is unremarkable, however it is not uncommon for x-ray findings to lag behind clinical symptoms. Her CURB 65 score is 0. She is allergic to penicillin so we'll treat her with Levaquin instead of azithromycin and amoxicillin. At this point the patient is medically stable for outpatient management verbalizes understanding and agreement with the plan.      ____________________________________________   FINAL CLINICAL IMPRESSION(S) / ED DIAGNOSES  Final diagnoses:  Community acquired pneumonia, unspecified laterality      NEW MEDICATIONS STARTED DURING THIS VISIT:  New Prescriptions   LEVOFLOXACIN (LEVAQUIN) 750 MG TABLET    Take 1 tablet (750 mg  total) by mouth daily.     Note:  This document was prepared using Dragon voice recognition software and may include unintentional dictation errors.     Darel Hong, MD 10/13/16 2037

## 2016-10-13 NOTE — ED Triage Notes (Signed)
Patient presents to the ED with very sore throat and shortness of breath/pain with breathing.  Patient reports she feels, "like I'm at the dentist and my mouth is full of water and they need to suction it."  Patient has a very hoarse voice and states the voice change has occurred today.  Patient reports at 5pm she took cough syrup with codeine and since then her "mouth has filled up with water."  Patient had an anaphylactic reaction to codeine in the past but reports taking it without issue, since.  Patient attempting to clear her throat in triage.

## 2016-10-13 NOTE — Discharge Instructions (Signed)
Please take all of your antibiotics as prescribed and follow-up with your primary care physician as needed. Return to the emergency department for any new or worsening symptoms such as fevers, chills, worsening shortness of breath, or for any other concerns whatsoever.  It was a pleasure to take care of you today, and thank you for coming to our emergency department.  If you have any questions or concerns before leaving please ask the nurse to grab me and I'm more than happy to go through your aftercare instructions again.  If you were prescribed any opioid pain medication today such as Norco, Vicodin, Percocet, morphine, hydrocodone, or oxycodone please make sure you do not drive when you are taking this medication as it can alter your ability to drive safely.  If you have any concerns once you are home that you are not improving or are in fact getting worse before you can make it to your follow-up appointment, please do not hesitate to call 911 and come back for further evaluation.  Darel Hong, MD  Results for orders placed or performed during the hospital encounter of 10/13/16  Comprehensive metabolic panel  Result Value Ref Range   Sodium 131 (L) 135 - 145 mmol/L   Potassium 3.8 3.5 - 5.1 mmol/L   Chloride 93 (L) 101 - 111 mmol/L   CO2 25 22 - 32 mmol/L   Glucose, Bld 128 (H) 65 - 99 mg/dL   BUN 10 6 - 20 mg/dL   Creatinine, Ser 0.86 0.44 - 1.00 mg/dL   Calcium 9.2 8.9 - 10.3 mg/dL   Total Protein 7.7 6.5 - 8.1 g/dL   Albumin 4.0 3.5 - 5.0 g/dL   AST 23 15 - 41 U/L   ALT 21 14 - 54 U/L   Alkaline Phosphatase 90 38 - 126 U/L   Total Bilirubin 1.0 0.3 - 1.2 mg/dL   GFR calc non Af Amer >60 >60 mL/min   GFR calc Af Amer >60 >60 mL/min   Anion gap 13 5 - 15  CBC with Differential  Result Value Ref Range   WBC 14.0 (H) 3.6 - 11.0 K/uL   RBC 4.02 3.80 - 5.20 MIL/uL   Hemoglobin 12.1 12.0 - 16.0 g/dL   HCT 35.5 35.0 - 47.0 %   MCV 88.3 80.0 - 100.0 fL   MCH 30.1 26.0 - 34.0 pg     MCHC 34.1 32.0 - 36.0 g/dL   RDW 13.6 11.5 - 14.5 %   Platelets 299 150 - 440 K/uL   Neutrophils Relative % 77 %   Neutro Abs 10.9 (H) 1.4 - 6.5 K/uL   Lymphocytes Relative 11 %   Lymphs Abs 1.5 1.0 - 3.6 K/uL   Monocytes Relative 12 %   Monocytes Absolute 1.6 (H) 0.2 - 0.9 K/uL   Eosinophils Relative 0 %   Eosinophils Absolute 0.0 0 - 0.7 K/uL   Basophils Relative 0 %   Basophils Absolute 0.0 0 - 0.1 K/uL   Dg Chest 2 View  Result Date: 10/13/2016 CLINICAL DATA:  C/o mid cp shob since early this AM EXAM: CHEST  2 VIEW COMPARISON:  Chest x-ray dated 01/04/2015. FINDINGS: Heart size is upper normal, stable. Atherosclerotic changes noted at the aortic arch. Lungs are clear. No pleural effusion or pneumothorax seen. Mild degenerative spurring within the thoracic spine. No acute or suspicious osseous finding. IMPRESSION: No active cardiopulmonary disease. No evidence of pneumonia or pulmonary edema. Aortic atherosclerosis. Electronically Signed   By: Franki Cabot  M.D.   On: 10/13/2016 18:53

## 2016-10-17 ENCOUNTER — Encounter: Payer: BLUE CROSS/BLUE SHIELD | Admitting: Family Medicine

## 2016-10-22 ENCOUNTER — Other Ambulatory Visit: Payer: Self-pay | Admitting: Internal Medicine

## 2016-10-23 ENCOUNTER — Telehealth: Payer: Self-pay | Admitting: Family Medicine

## 2016-10-23 ENCOUNTER — Other Ambulatory Visit: Payer: Self-pay | Admitting: Family Medicine

## 2016-10-23 NOTE — Telephone Encounter (Signed)
Please notify patient that it was very nice meeting her and that I'm willing to take her on, but I don't support frequent use of Clonazepam, only as needed use (max 10 tablets monthly). Please make her aware that we will work to wean her off/down of this medication if she takes it more than 10 times monthly.

## 2016-10-23 NOTE — Telephone Encounter (Signed)
Fine with me

## 2016-10-23 NOTE — Telephone Encounter (Signed)
Last OV was 10/13/16 with [place] office, last refill on medication was 08/01/16, #30 with 1 refill, please advise, thanks

## 2016-10-23 NOTE — Telephone Encounter (Signed)
Caller Name:Tyleigh Schuyler Relationship to [place]:  Reason for call: pt would like to change providers from [facility] to [facility], from Dr Caryl Bis to Allie Bossier.  She feels that the office at [facility] is more compassionate and caring, and she prefers a female provider also.   Please let me know of this is ok with you.

## 2016-10-23 NOTE — Telephone Encounter (Signed)
Please see what dose the patient is currently taking. It appears she has 5 mg listed though has been on 10 mg as well. We can provide her with one month of refills though she needs follow-up for further refills. Thanks.

## 2016-10-24 NOTE — Telephone Encounter (Signed)
Pt called back returning your call. Thank you! °

## 2016-10-24 NOTE — Telephone Encounter (Signed)
Attempted to reach patient regarding dosing of medication and to schedule an appt for follow up. thanks

## 2016-10-25 ENCOUNTER — Encounter: Payer: Self-pay | Admitting: Obstetrics and Gynecology

## 2016-10-25 ENCOUNTER — Ambulatory Visit (INDEPENDENT_AMBULATORY_CARE_PROVIDER_SITE_OTHER): Payer: BLUE CROSS/BLUE SHIELD | Admitting: Obstetrics and Gynecology

## 2016-10-25 VITALS — BP 133/88 | HR 101 | Ht 61.0 in | Wt 159.3 lb

## 2016-10-25 DIAGNOSIS — N809 Endometriosis, unspecified: Secondary | ICD-10-CM

## 2016-10-25 DIAGNOSIS — N6310 Unspecified lump in the right breast, unspecified quadrant: Secondary | ICD-10-CM

## 2016-10-25 DIAGNOSIS — D0511 Intraductal carcinoma in situ of right breast: Secondary | ICD-10-CM | POA: Diagnosis not present

## 2016-10-25 DIAGNOSIS — M8580 Other specified disorders of bone density and structure, unspecified site: Secondary | ICD-10-CM

## 2016-10-25 DIAGNOSIS — Z90710 Acquired absence of both cervix and uterus: Secondary | ICD-10-CM

## 2016-10-25 DIAGNOSIS — Z1211 Encounter for screening for malignant neoplasm of colon: Secondary | ICD-10-CM | POA: Diagnosis not present

## 2016-10-25 DIAGNOSIS — Z01419 Encounter for gynecological examination (general) (routine) without abnormal findings: Secondary | ICD-10-CM | POA: Diagnosis not present

## 2016-10-25 NOTE — Progress Notes (Signed)
ANNUAL PREVENTATIVE CARE GYN  ENCOUNTER NOTE  Subjective:       Christie Ramirez is a 61 y.o. G65P0100 female here for a routine annual gynecologic exam.  Current complaints: 1. 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 Denies vaginal bleeding, discharge, itching or dryness. Endorses occasional night sweats that are not bothersome according to pt. Sexually active with fiance, denies dyspareunia.   Reports nocturia 3-4x a night with associated urgency. States symptoms are worse at night, no urgency or frequency during the day. Denies stress incontinence. Denies instances of lost urine. States sx are not bothersome. Endorses increased water intake.   Dx with osteopenia in 2015, DEXA score of -1.4. Currently taking raloxifene 60 mg QD, as well as calcium and vitamin D supplement.   Endorses right hip pain that began x4 months ago. States pain is only present with prolonged walking, and fluctuates with the weather. Takes 800 mg ibuprofen Q8 hrs with relief. Pt states she only has to take ibuprofen for pain approximately once every 2-3 weeks. Describes pain as dull and achy, denies sciatica. Denies trauma.    Gynecologic History No LMP recorded. Patient has had a hysterectomy. Contraception: status post hysterectomy Last Pap: 06/2015 neg/neg  Last mammogram: 02/21/2016 birad 3. Results were: normal  Obstetric History OB History  Gravida Para Term Preterm AB Living  1 1   1       SAB TAB Ectopic Multiple Live Births               # Outcome Date GA Lbr Len/2nd Weight Sex Delivery Anes PTL Lv  1 Preterm 1995     [id number]-[id number]   FD      Past Medical History:  Diagnosis Date  . Anxiety   . 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   . Cancer ([place]) 1995   thyroid  . Endometriosis 2009  . Hyperlipidemia   . Hypothyroidism   .  Osteopenia    rt hip    Past Surgical History:  Procedure Laterality Date  . ABDOMINAL HYSTERECTOMY  2005   Martin DeFrancesco,MD  . BREAST BIOPSY Right 04-02-13   INVASIVE MAMMARY CARCINOMA WITH FEATURES OF TUBULAR CARCINOMA,  . BREAST SURGERY Right 05/01/13   wide excision  . colectomy  2009   secondary to endometriosis DR. Smith  . 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 No malignancy.  . COLONOSCOPY  2009   Dr. Tamala Julian  . FOOT SURGERY  right    Current Outpatient Prescriptions on File Prior to Visit  Medication Sig Dispense Refill  . albuterol (PROVENTIL HFA;VENTOLIN HFA) 108 (90 Base) MCG/ACT inhaler Inhale 2 puffs into the lungs every 6 (six) hours as needed for wheezing or shortness of breath. 1 Inhaler 0  . amLODipine (NORVASC) 5 MG tablet Take 1 tablet (5 mg total) by mouth daily. 90 tablet 3  . aspirin 81 MG tablet Take 81 mg by mouth daily.    Marland Kitchen atorvastatin (LIPITOR) 40 MG tablet TAKE 1 TABLET (40 MG TOTAL) BY MOUTH DAILY. 90 tablet 2  . CALCIUM-MAGNESIUM-ZINC PO Take by mouth 3 (three) times daily.    . Cholecalciferol (VITAMIN D-3 PO) Take 1,000 Units by mouth.    . clonazePAM (KLONOPIN) 1 MG tablet TAKE 1 TABLET BY MOUTH 3 TIMES A DAY AS  NEEDED ANXIETY 90 tablet 1  . escitalopram (LEXAPRO) 20 MG tablet Take 1 tablet (20 mg total) by mouth daily. 90 tablet 3  . GARLIC PO Take 767 mg by mouth.    . levothyroxine (SYNTHROID, LEVOTHROID) 150 MCG tablet TAKE 1 TABLET (125 MCG TOTAL) BY MOUTH DAILY. 90 tablet 1  . losartan (COZAAR) 100 MG tablet TAKE 1 TABLET (100 MG TOTAL) BY MOUTH DAILY. 90 tablet 3  . Multiple Vitamins-Minerals (MULTIVITAMIN WITH MINERALS) tablet Take 1 tablet by mouth daily.    . raloxifene (EVISTA) 60 MG tablet Take 1 tablet (60 mg total) by mouth daily. 90 tablet 1  . vitamin C (ASCORBIC ACID) 500 MG tablet Take 500 mg by mouth daily.    Marland Kitchen zolpidem (AMBIEN) 5 MG tablet Take 1 tablet (5 mg total) by  mouth at bedtime as needed. 30 tablet 1   No current facility-administered medications on file prior to visit.     Allergies  Allergen Reactions  . Codeine Anaphylaxis  . Ace Inhibitors   . Iodinated Diagnostic Agents Hives  . Penicillins Rash    Social History   Social History  . Marital status: Single    Spouse name: N/A  . Number of children: N/A  . Years of education: N/A   Occupational History  . Not on file.   Social History Main Topics  . Smoking status: Never Smoker  . Smokeless tobacco: Never Used  . Alcohol use 0.0 oz/week     Comment: wine- daily  . Drug use: No  . Sexual activity: Yes    Birth control/ protection: Surgical   Other Topics Concern  . Not on file   Social History Narrative  . No narrative on file    Family History  Problem Relation Age of Onset  . Colon polyps Father        age 60  . Heart disease Father   . Heart disease Mother   . Heart disease Paternal Grandmother   . Heart disease Paternal Grandfather   . Cancer Neg Hx   . Diabetes Neg Hx     The following portions of the patient's history were reviewed and updated as appropriate: allergies, current medications, past family history, past medical history, past social history, past surgical history and problem list.  Review of Systems Review of Systems  Constitutional: Negative for chills, fever and malaise/fatigue.  HENT: Negative for ear pain, hearing loss, sore throat and tinnitus.   Eyes: Negative for blurred vision and pain.  Respiratory: Negative for cough, shortness of breath and wheezing.   Cardiovascular: Negative for chest pain, palpitations, claudication and leg swelling.  Gastrointestinal: Negative for abdominal pain, constipation, diarrhea, nausea and vomiting.  Genitourinary: Negative for dysuria.  Skin: Negative for rash.  Neurological: Negative for dizziness, loss of consciousness and headaches.    Objective:   BP 133/88   Pulse (!) 101   Ht 5\' 1"   (1.549 m)   Wt 159 lb 4.8 oz (72.3 kg)   BMI 30.10 kg/m  CONSTITUTIONAL: Well-developed, well-nourished female in no acute distress.  PSYCHIATRIC: Normal mood and affect. Normal behavior. Normal judgment and thought content. Murphy: Alert and oriented to person, place, and time. Normal muscle tone coordination. No cranial nerve deficit noted. HENT:  Normocephalic, atraumatic, External right and left ear normal. Oropharynx is clear and moist EYES: Conjunctivae and EOM are normal. Pupils are equal, round, and reactive to light. No scleral icterus.  NECK: Normal range of motion, supple, no masses. Thyroid  surgically absent.  SKIN: Skin is warm and dry. No rash noted. Not diaphoretic. No erythema. No pallor. CARDIOVASCULAR: Normal heart rate noted, regular rhythm, no murmur. RESPIRATORY: Clear to auscultation bilaterally. Effort and breath sounds normal, no problems with respiration noted. BREASTS: Symmetric in size. No masses, skin changes, nipple drainage, or lymphadenopathy. 2.3 cm nodule noted just inferior to the scar in her right breast, stable; bilateral breast bruising ABDOMEN: Soft, normal bowel sounds, no distention noted.  No tenderness, rebound or guarding.  BLADDER: Normal PELVIC:  External Genitalia: Normal  BUS: Normal  Vagina: Mild-moderate atrophy; vaginal cuff intact  Cervix: Surgically absent  Uterus: Surgically absent    Bimanual: No palpable masses or tenderness  Adnexa: Normal  RV: External Exam NormaI, No Rectal Masses and Normal Sphincter tone  MUSCULOSKELETAL: Normal range of motion. No tenderness.  No cyanosis, clubbing, or edema.  2+ DP/PT pulses. LYMPHATIC: No Axillary, Supraclavicular, cervical or Inguinal Adenopathy.    Assessment:   Annual gynecologic examination 61 y.o. Contraception: status post hysterectomy bmi-30 Osteopenia on raloxifene  Vaginal atrophy, postmenopausal  History of DCIS, right breast, NED  Plan:  Pap: Not needed Mammogram:  Ordered Stool Guaiac Testing:  epi pro colon Labs:lipid fbs tsh a1c  Routine preventative health maintenance measures emphasized: Exercise/Diet/Weight control, Tobacco Warnings, Alcohol/Substance use risks, Stress Management and Safe Sex Continue raloxifene, calcium and vitamin D supplement Return to Clinic - [address] [place], CMA [employer], PA-S Brayton Mars, MD   I have seen, interviewed, and examined the patient in conjunction with the [employer].A. student and affirm the diagnosis and management plan. Martin A. DeFrancesco, MD, FACOG   Note: This dictation was prepared with Dragon dictation along with smaller phrase technology. Any transcriptional errors that result from this process are unintentional.

## 2016-10-25 NOTE — Addendum Note (Signed)
Addended by: Elouise Munroe on: 10/25/2016 05:03 PM   Modules accepted: Orders

## 2016-10-25 NOTE — Patient Instructions (Addendum)
1. No Pap smear is needed 2. Mammogram is ordered 3. Epi pro colon test is ordered for colon cancer screening 4. Screening labs are ordered 5. Continue with healthy eating and exercise 6. Continue with Evista 60 mg a day 7. Return in 1 year for annual exam   Health Maintenance for Postmenopausal Women Menopause is a normal process in which your reproductive ability comes to an end. This process happens gradually over a span of months to years, usually between the ages of 54 and 27. Menopause is complete when you have missed 12 consecutive menstrual periods. It is important to talk with your health care provider about some of the most common conditions that affect postmenopausal women, such as heart disease, cancer, and bone loss (osteoporosis). Adopting a healthy lifestyle and getting preventive care can help to promote your health and wellness. Those actions can also lower your chances of developing some of these common conditions. What should I know about menopause? During menopause, you may experience a number of symptoms, such as:  Moderate-to-severe hot flashes.  Night sweats.  Decrease in sex drive.  Mood swings.  Headaches.  Tiredness.  Irritability.  Memory problems.  Insomnia.  Choosing to treat or not to treat menopausal changes is an individual decision that you make with your health care provider. What should I know about hormone replacement therapy and supplements? Hormone therapy products are effective for treating symptoms that are associated with menopause, such as hot flashes and night sweats. Hormone replacement carries certain risks, especially as you become older. If you are thinking about using estrogen or estrogen with progestin treatments, discuss the benefits and risks with your health care provider. What should I know about heart disease and stroke? Heart disease, heart attack, and stroke become more likely as you age. This may be due, in part, to the  hormonal changes that your body experiences during menopause. These can affect how your body processes dietary fats, triglycerides, and cholesterol. Heart attack and stroke are both medical emergencies. There are many things that you can do to help prevent heart disease and stroke:  Have your blood pressure checked at least every 1-2 years. High blood pressure causes heart disease and increases the risk of stroke.  If you are 55-8 years old, ask your health care provider if you should take aspirin to prevent a heart attack or a stroke.  Do not use any tobacco products, including cigarettes, chewing tobacco, or electronic cigarettes. If you need help quitting, ask your health care provider.  It is important to eat a healthy diet and maintain a healthy weight. ? Be sure to include plenty of vegetables, fruits, low-fat dairy products, and lean protein. ? Avoid eating foods that are high in solid fats, added sugars, or salt (sodium).  Get regular exercise. This is one of the most important things that you can do for your health. ? Try to exercise for at least 150 minutes each week. The type of exercise that you do should increase your heart rate and make you sweat. This is known as moderate-intensity exercise. ? Try to do strengthening exercises at least twice each week. Do these in addition to the moderate-intensity exercise.  Know your numbers.Ask your health care provider to check your cholesterol and your blood glucose. Continue to have your blood tested as directed by your health care provider.  What should I know about cancer screening? There are several types of cancer. Take the following steps to reduce your risk and to  catch any cancer development as early as possible. Breast Cancer  Practice breast self-awareness. ? This means understanding how your breasts normally appear and feel. ? It also means doing regular breast self-exams. Let your health care provider know about any changes,  no matter how small.  If you are 40 or older, have a clinician do a breast exam (clinical breast exam or CBE) every year. Depending on your age, family history, and medical history, it may be recommended that you also have a yearly breast X-ray (mammogram).  If you have a family history of breast cancer, talk with your health care provider about genetic screening.  If you are at high risk for breast cancer, talk with your health care provider about having an MRI and a mammogram every year.  Breast cancer (BRCA) gene test is recommended for women who have family members with BRCA-related cancers. Results of the assessment will determine the need for genetic counseling and BRCA1 and for BRCA2 testing. BRCA-related cancers include these types: ? Breast. This occurs in males or females. ? Ovarian. ? Tubal. This may also be called fallopian tube cancer. ? Cancer of the abdominal or pelvic lining (peritoneal cancer). ? Prostate. ? Pancreatic.  Cervical, Uterine, and Ovarian Cancer Your health care provider may recommend that you be screened regularly for cancer of the pelvic organs. These include your ovaries, uterus, and vagina. This screening involves a pelvic exam, which includes checking for microscopic changes to the surface of your cervix (Pap test).  For women ages 21-65, health care providers may recommend a pelvic exam and a Pap test every three years. For women ages 58-65, they may recommend the Pap test and pelvic exam, combined with testing for human papilloma virus (HPV), every five years. Some types of HPV increase your risk of cervical cancer. Testing for HPV may also be done on women of any age who have unclear Pap test results.  Other health care providers may not recommend any screening for nonpregnant women who are considered low risk for pelvic cancer and have no symptoms. Ask your health care provider if a screening pelvic exam is right for you.  If you have had past treatment  for cervical cancer or a condition that could lead to cancer, you need Pap tests and screening for cancer for at least 20 years after your treatment. If Pap tests have been discontinued for you, your risk factors (such as having a new sexual partner) need to be reassessed to determine if you should start having screenings again. Some women have medical problems that increase the chance of getting cervical cancer. In these cases, your health care provider may recommend that you have screening and Pap tests more often.  If you have a family history of uterine cancer or ovarian cancer, talk with your health care provider about genetic screening.  If you have vaginal bleeding after reaching menopause, tell your health care provider.  There are currently no reliable tests available to screen for ovarian cancer.  Lung Cancer Lung cancer screening is recommended for adults 21-62 years old who are at high risk for lung cancer because of a history of smoking. A yearly low-dose CT scan of the lungs is recommended if you:  Currently smoke.  Have a history of at least 30 pack-years of smoking and you currently smoke or have quit within the past 15 years. A pack-year is smoking an average of one pack of cigarettes per day for one year.  Yearly screening should:  Continue until it has been 15 years since you quit.  Stop if you develop a health problem that would prevent you from having lung cancer treatment.  Colorectal Cancer  This type of cancer can be detected and can often be prevented.  Routine colorectal cancer screening usually begins at age 79 and continues through age 38.  If you have risk factors for colon cancer, your health care provider may recommend that you be screened at an earlier age.  If you have a family history of colorectal cancer, talk with your health care provider about genetic screening.  Your health care provider may also recommend using home test kits to check for hidden  blood in your stool.  A small camera at the end of a tube can be used to examine your colon directly (sigmoidoscopy or colonoscopy). This is done to check for the earliest forms of colorectal cancer.  Direct examination of the colon should be repeated every 5-10 years until age 15. However, if early forms of precancerous polyps or small growths are found or if you have a family history or genetic risk for colorectal cancer, you may need to be screened more often.  Skin Cancer  Check your skin from head to toe regularly.  Monitor any moles. Be sure to tell your health care provider: ? About any new moles or changes in moles, especially if there is a change in a mole's shape or color. ? If you have a mole that is larger than the size of a pencil eraser.  If any of your family members has a history of skin cancer, especially at a young age, talk with your health care provider about genetic screening.  Always use sunscreen. Apply sunscreen liberally and repeatedly throughout the day.  Whenever you are outside, protect yourself by wearing long sleeves, pants, a wide-brimmed hat, and sunglasses.  What should I know about osteoporosis? Osteoporosis is a condition in which bone destruction happens more quickly than new bone creation. After menopause, you may be at an increased risk for osteoporosis. To help prevent osteoporosis or the bone fractures that can happen because of osteoporosis, the following is recommended:  If you are 42-2 years old, get at least 1,000 mg of calcium and at least 600 mg of vitamin D per day.  If you are older than age 22 but younger than age 58, get at least 1,200 mg of calcium and at least 600 mg of vitamin D per day.  If you are older than age 66, get at least 1,200 mg of calcium and at least 800 mg of vitamin D per day.  Smoking and excessive alcohol intake increase the risk of osteoporosis. Eat foods that are rich in calcium and vitamin D, and do weight-bearing  exercises several times each week as directed by your health care provider. What should I know about how menopause affects my mental health? Depression may occur at any age, but it is more common as you become older. Common symptoms of depression include:  Low or sad mood.  Changes in sleep patterns.  Changes in appetite or eating patterns.  Feeling an overall lack of motivation or enjoyment of activities that you previously enjoyed.  Frequent crying spells.  Talk with your health care provider if you think that you are experiencing depression. What should I know about immunizations? It is important that you get and maintain your immunizations. These include:  Tetanus, diphtheria, and pertussis (Tdap) booster vaccine.  Influenza every year before the  flu season begins.  Pneumonia vaccine.  Shingles vaccine.  Your health care provider may also recommend other immunizations. This information is not intended to replace advice given to you by your health care provider. Make sure you discuss any questions you have with your health care provider. Document Released: 03/03/2005 Document Revised: 07/30/2015 Document Reviewed: 10/13/2014 Elsevier Interactive Patient Education  2018 [employer].

## 2016-10-26 ENCOUNTER — Other Ambulatory Visit: Payer: BLUE CROSS/BLUE SHIELD

## 2016-10-27 NOTE — Telephone Encounter (Signed)
Pt called back returning your call. Thank you! °

## 2016-10-27 NOTE — Telephone Encounter (Signed)
Left a VM to return my call, thanks 

## 2016-10-30 ENCOUNTER — Encounter: Payer: Self-pay | Admitting: Family Medicine

## 2016-10-30 ENCOUNTER — Ambulatory Visit (INDEPENDENT_AMBULATORY_CARE_PROVIDER_SITE_OTHER): Payer: BLUE CROSS/BLUE SHIELD

## 2016-10-30 ENCOUNTER — Ambulatory Visit (INDEPENDENT_AMBULATORY_CARE_PROVIDER_SITE_OTHER): Payer: BLUE CROSS/BLUE SHIELD | Admitting: Family Medicine

## 2016-10-30 VITALS — BP 130/92 | HR 94 | Temp 98.2°F | Wt 160.0 lb

## 2016-10-30 DIAGNOSIS — G4733 Obstructive sleep apnea (adult) (pediatric): Secondary | ICD-10-CM

## 2016-10-30 DIAGNOSIS — M542 Cervicalgia: Secondary | ICD-10-CM | POA: Insufficient documentation

## 2016-10-30 DIAGNOSIS — E785 Hyperlipidemia, unspecified: Secondary | ICD-10-CM

## 2016-10-30 DIAGNOSIS — E039 Hypothyroidism, unspecified: Secondary | ICD-10-CM | POA: Diagnosis not present

## 2016-10-30 DIAGNOSIS — I10 Essential (primary) hypertension: Secondary | ICD-10-CM | POA: Diagnosis not present

## 2016-10-30 DIAGNOSIS — G4700 Insomnia, unspecified: Secondary | ICD-10-CM

## 2016-10-30 DIAGNOSIS — F419 Anxiety disorder, unspecified: Secondary | ICD-10-CM | POA: Diagnosis not present

## 2016-10-30 DIAGNOSIS — M50323 Other cervical disc degeneration at C6-C7 level: Secondary | ICD-10-CM | POA: Diagnosis not present

## 2016-10-30 NOTE — Assessment & Plan Note (Signed)
Slightly above goal. Suspect untreated sleep apnea is contributing. She'll continue her current medication regimen and we will get her set up for a sleep study with CPAP titration.

## 2016-10-30 NOTE — Progress Notes (Signed)
[name], MD Phone: [phone]  Christie Ramirez is a 61 y.o. female who presents today for follow-up.   Hypertension: Taking amlodipine and losartan. No chest pain or shortness of breath.  Anxiety/depression: Does note that it's mostly anxiety. She notes most of her anxiety is in the late afternoon and evening. She gets anxiety related to not eating or meeting targets at work. She does note when she lays down at night feeling anxious particularly since she has not been taking the Ambien and feels as though her heart rate increases while she is anxious. That only occurs when she lays down at night when she calms down it improves. EKG from the emergency room when she previously had pneumonia with sinus tachycardia. No depression currently. Taking Klonopin once daily. This is much less than previously. Taking Lexapro as well. No SI.  Patient notes chronic sleep issues since 2002. She's been on Ambien since that time. She discontinued this about a week ago as she read that it can affect your memory. She's been getting about 5 hours of restless sleep at night. She appears to have had a sleep study previously that revealed obstructive sleep apnea. She reports she was never set up with the CPAP. On review of the chart it appears that our office tried to contact her though could not get in touch with her and left several messages. She does snore. No apneic episodes per her report.  She notes some left trapezius neck discomfort that has been going on for last month and a half. No injury. Notes occasional numbness while she is sleeping into her left dorsal forearm and hand. That resolves when she changes positions. No other numbness or weakness.  PMH: nonsmoker.   ROS see history of present illness  Objective  Physical Exam Vitals:   10/30/16 0947  BP: (!) 130/92  Pulse: 94  Temp: 98.2 F (36.8 C)  SpO2: 97%    BP Readings from Last 3 Encounters:  10/30/16 (!) 130/92  10/25/16  133/88  10/13/16 (!) 127/94   Wt Readings from Last 3 Encounters:  10/30/16 160 lb (72.6 kg)  10/25/16 159 lb 4.8 oz (72.3 kg)  10/13/16 163 lb (73.9 kg)    Physical Exam  Constitutional: No distress.  Cardiovascular: Normal rate, regular rhythm and normal heart sounds.   Pulmonary/Chest: Effort normal and breath sounds normal.  Musculoskeletal: She exhibits no edema.  No midline neck tenderness, no midline neck step-off, there is proximal trapezius insertion point muscular tenderness with no overlying skin changes  Neurological: She is alert. Gait normal.  5/5 strength in bilateral biceps, triceps, grip, quads, hamstrings, plantar and dorsiflexion, sensation to light touch intact in bilateral UE and LE  Skin: Skin is warm and dry. She is not diaphoretic.     Assessment/Plan: Please see individual problem list.  Hypertension Slightly above goal. Suspect untreated sleep apnea is contributing. She'll continue her current medication regimen and we will get her set up for a sleep study with CPAP titration.  OSA (obstructive sleep apnea) Prior sleep study revealing sleep apnea. Has not undergone CPAP titration. We will arrange for this to be done.  Anxiety Anxiety better with time. No depression. Continue to monitor on her current regimen.  Insomnia Now off Ambien. Suspect she is experiencing anxiety prior to falling asleep at night. Offered repeat EKG or cardiology evaluation though we opted to defer this and opted to monitor. If elevated heart rate starts happening at any other times or is persistent she  will let us know. We'll see how she does after getting CPAP following CPAP titration study.  Neck pain Muscular strain versus arthritis versus DDD. Obtain x-ray. Sending for physical therapy.   Orders Placed This Encounter  Procedures  . DG Cervical Spine Complete    Standing Status:   Future    Number of Occurrences:   1    Standing Expiration Date:   12/30/2017    Order  Specific Question:   Reason for Exam (SYMPTOM  OR DIAGNOSIS REQUIRED)    Answer:   neck pain with radicular numbness in left hand and dorsal forearm    Order Specific Question:   Is patient pregnant?    Answer:   No    Order Specific Question:   Preferred imaging location?    Answer:   [facility] Specific Question:   Radiology Contrast Protocol - do NOT remove file path    Answer:   [email or web address]  . Comp Met (CMET)    Standing Status:   Future    Standing Expiration Date:   10/30/2017  . CBC    Standing Status:   Future    Standing Expiration Date:   10/30/2017  . HgB A1c    Standing Status:   Future    Standing Expiration Date:   10/30/2017  . TSH    Standing Status:   Future    Standing Expiration Date:   10/30/2017  . Lipid panel    Standing Status:   Future    Standing Expiration Date:   10/30/2017  . Ambulatory referral to Physical Therapy    Referral Priority:   Routine    Referral Type:   Physical Medicine    Referral Reason:   Specialty Services Required    Requested Specialty:   Physical Therapy    Number of Visits Requested:   1  . Cpap titration    Standing Status:   Future    Standing Expiration Date:   10/30/2017    Order Specific Question:   Where should this test be performed:    Answer:   [facility]    [name], MD [place]

## 2016-10-30 NOTE — Assessment & Plan Note (Addendum)
Muscular strain versus arthritis versus DDD. Obtain x-ray. Sending for physical therapy.

## 2016-10-30 NOTE — Patient Instructions (Signed)
Nice to see you. We will have you return for fasting lab work. We'll get you set up for a sleep study with CPAP titration. We'll get you for physical therapy for your neck and an x-ray.

## 2016-10-30 NOTE — Assessment & Plan Note (Signed)
Now off Ambien. Suspect she is experiencing anxiety prior to falling asleep at night. Offered repeat EKG or cardiology evaluation though we opted to defer this and opted to monitor. If elevated heart rate starts happening at any other times or is persistent she will let us know. We'll see how she does after getting CPAP following CPAP titration study.

## 2016-10-30 NOTE — Assessment & Plan Note (Signed)
Prior sleep study revealing sleep apnea. Has not undergone CPAP titration. We will arrange for this to be done.

## 2016-10-30 NOTE — Assessment & Plan Note (Signed)
Anxiety better with time. No depression. Continue to monitor on her current regimen.

## 2016-11-01 NOTE — Telephone Encounter (Signed)
Sent my chart message to patient , thanks

## 2016-11-02 ENCOUNTER — Telehealth: Payer: Self-pay | Admitting: Family Medicine

## 2016-11-02 NOTE — Telephone Encounter (Signed)
Left message to notify

## 2016-11-02 NOTE — Telephone Encounter (Signed)
Please let the patient know that I heard back from the pharmacist and she confirmed what I thought regarding the pneumonia vaccine. The patient does not meet criteria to get the pneumonia vaccine at this time. Thanks.

## 2016-11-02 NOTE — Telephone Encounter (Signed)
-----   Message from Carlean Jews, [facility] sent at 10/31/2016  3:51 PM EDT ----- Per the CDC these are the populations that should get pneumovax before 65:  .With chronic illnesses (chronic heart, liver, kidney, or lung [including chronic obstructive lung disease, emphysema, and asthma] disease; diabetes; or alcoholism) .With conditions that weaken the immune system (HIV/AIDS, cancer, or damaged/absent spleen) .With cochlear implants or cerebrospinal fluid (CSF) leaks (escape of the fluid that surrounds the brain and spinal cord) .Who smoke cigarettes   From my reading, it's only those with active cancer/undergoing treatment (AKA those that are immunologically suppressed). If she has a history and is in remission, finished with treatment several months ago, I don't think she qualifies.   Chrys Racer ----- Message ----- From: Leone Haven, MD Sent: 10/30/2016   8:24 PM To: Carlean Jews, [place]  Do you know if prior history of cancer or prior radiation treatment/chemo meets criteria for pneumovax? Thanks. Eric.

## 2016-11-03 ENCOUNTER — Other Ambulatory Visit: Payer: BLUE CROSS/BLUE SHIELD

## 2016-11-06 ENCOUNTER — Other Ambulatory Visit: Payer: Self-pay | Admitting: Primary Care

## 2016-11-06 NOTE — Telephone Encounter (Signed)
Please contact the patient and see if there is a reason why she needs a refill on this as it was for an acute issue. Thanks.

## 2016-11-06 NOTE — Telephone Encounter (Signed)
Electronically refill request for albuterol (PROVENTIL HFA;VENTOLIN HFA) 108 (90 Base)   Last prescribed when patient was seen for acute with Allie Bossier.

## 2016-11-07 ENCOUNTER — Other Ambulatory Visit: Payer: Self-pay

## 2016-11-07 NOTE — Telephone Encounter (Signed)
Last OV 10/30/16 last filled by Dr.Walker 08/11/15 90 3rf

## 2016-11-07 NOTE — Telephone Encounter (Signed)
Left message to return call 

## 2016-11-09 ENCOUNTER — Other Ambulatory Visit: Payer: Self-pay | Admitting: Internal Medicine

## 2016-11-09 ENCOUNTER — Encounter: Payer: Self-pay | Admitting: *Deleted

## 2016-11-09 NOTE — Telephone Encounter (Signed)
Appears to have already been sent in under the wrong provider's name. I will forward to the person who sent this in so that they can correct this.

## 2016-11-10 MED ORDER — ESCITALOPRAM OXALATE 20 MG PO TABS
20.0000 mg | ORAL_TABLET | Freq: Every day | ORAL | 3 refills | Status: DC
Start: 2016-11-10 — End: 2017-01-30

## 2016-11-13 NOTE — Telephone Encounter (Signed)
Mailed unread my chart message to patient

## 2016-11-16 NOTE — Telephone Encounter (Signed)
Left message to return call 

## 2016-11-21 ENCOUNTER — Encounter: Payer: Self-pay | Admitting: *Deleted

## 2016-12-11 ENCOUNTER — Other Ambulatory Visit: Payer: Self-pay | Admitting: Obstetrics and Gynecology

## 2017-01-26 ENCOUNTER — Other Ambulatory Visit (INDEPENDENT_AMBULATORY_CARE_PROVIDER_SITE_OTHER): Payer: BLUE CROSS/BLUE SHIELD

## 2017-01-26 DIAGNOSIS — E785 Hyperlipidemia, unspecified: Secondary | ICD-10-CM | POA: Diagnosis not present

## 2017-01-26 DIAGNOSIS — I10 Essential (primary) hypertension: Secondary | ICD-10-CM

## 2017-01-26 DIAGNOSIS — E039 Hypothyroidism, unspecified: Secondary | ICD-10-CM | POA: Diagnosis not present

## 2017-01-26 LAB — COMPREHENSIVE METABOLIC PANEL
ALBUMIN: 4.2 g/dL (ref 3.5–5.2)
ALK PHOS: 91 U/L (ref 39–117)
ALT: 33 U/L (ref 0–35)
AST: 18 U/L (ref 0–37)
BILIRUBIN TOTAL: 0.7 mg/dL (ref 0.2–1.2)
BUN: 19 mg/dL (ref 6–23)
CO2: 27 mEq/L (ref 19–32)
Calcium: 9.3 mg/dL (ref 8.4–10.5)
Chloride: 108 mEq/L (ref 96–112)
Creatinine, Ser: 0.61 mg/dL (ref 0.40–1.20)
GFR: 106.2 mL/min (ref >60.00)
Glucose, Bld: 94 mg/dL (ref 70–99)
POTASSIUM: 4.5 meq/L (ref 3.5–5.1)
Sodium: 144 mEq/L (ref 135–145)
Total Protein: 7 g/dL (ref 6.0–8.3)

## 2017-01-26 LAB — LIPID PANEL
CHOLESTEROL: 210 mg/dL — AB (ref 0–200)
HDL: 57.5 mg/dL (ref >39.00)
LDL Cholesterol: 123 mg/dL — ABNORMAL HIGH (ref 0–99)
NonHDL: 152.98
Total CHOL/HDL Ratio: 4
Triglycerides: 151 mg/dL — ABNORMAL HIGH (ref 0.0–149.0)
VLDL: 30.2 mg/dL (ref 0.0–40.0)

## 2017-01-26 LAB — CBC
HEMATOCRIT: 38.6 % (ref 36.0–46.0)
HEMOGLOBIN: 12.6 g/dL (ref 12.0–15.0)
MCHC: 32.6 g/dL (ref 30.0–36.0)
MCV: 89.7 fl (ref 78.0–100.0)
PLATELETS: 343 10*3/uL (ref 150.0–400.0)
RBC: 4.3 Mil/uL (ref 3.87–5.11)
RDW: 13.9 % (ref 11.5–15.5)
WBC: 7 10*3/uL (ref 4.0–10.5)

## 2017-01-26 LAB — HEMOGLOBIN A1C: HEMOGLOBIN A1C: 6 % (ref 4.6–6.5)

## 2017-01-26 LAB — TSH: TSH: 5.35 u[IU]/mL — ABNORMAL HIGH (ref 0.35–4.50)

## 2017-01-30 ENCOUNTER — Ambulatory Visit (INDEPENDENT_AMBULATORY_CARE_PROVIDER_SITE_OTHER): Payer: BLUE CROSS/BLUE SHIELD | Admitting: Family Medicine

## 2017-01-30 ENCOUNTER — Other Ambulatory Visit: Payer: Self-pay

## 2017-01-30 ENCOUNTER — Encounter: Payer: Self-pay | Admitting: Family Medicine

## 2017-01-30 VITALS — BP 120/82 | HR 95 | Temp 97.7°F | Ht 61.0 in | Wt 156.8 lb

## 2017-01-30 DIAGNOSIS — Z1159 Encounter for screening for other viral diseases: Secondary | ICD-10-CM

## 2017-01-30 DIAGNOSIS — R002 Palpitations: Secondary | ICD-10-CM | POA: Diagnosis not present

## 2017-01-30 DIAGNOSIS — Z23 Encounter for immunization: Secondary | ICD-10-CM | POA: Diagnosis not present

## 2017-01-30 DIAGNOSIS — E039 Hypothyroidism, unspecified: Secondary | ICD-10-CM

## 2017-01-30 DIAGNOSIS — K219 Gastro-esophageal reflux disease without esophagitis: Secondary | ICD-10-CM | POA: Diagnosis not present

## 2017-01-30 DIAGNOSIS — E785 Hyperlipidemia, unspecified: Secondary | ICD-10-CM | POA: Diagnosis not present

## 2017-01-30 DIAGNOSIS — Z0001 Encounter for general adult medical examination with abnormal findings: Secondary | ICD-10-CM

## 2017-01-30 DIAGNOSIS — R61 Generalized hyperhidrosis: Secondary | ICD-10-CM | POA: Diagnosis not present

## 2017-01-30 DIAGNOSIS — K625 Hemorrhage of anus and rectum: Secondary | ICD-10-CM | POA: Diagnosis not present

## 2017-01-30 DIAGNOSIS — G5603 Carpal tunnel syndrome, bilateral upper limbs: Secondary | ICD-10-CM | POA: Diagnosis not present

## 2017-01-30 MED ORDER — WRIST SPLINT/COCK-UP/RIGHT M MISC: 0 refills | Status: DC

## 2017-01-30 MED ORDER — WRIST SPLINT/COCK-UP/LEFT M MISC: 0 refills | Status: DC

## 2017-01-30 NOTE — Patient Instructions (Signed)
Nice to see you. We will get you to see GI. Please contact the breast center to set up your mammogram.  This was ordered by Dr. Enzo Bi. We will have you return for lab work in about 6 weeks to recheck your LDL cholesterol as well as checking her TSH and hepatitis C. Please continue to work on diet and exercise.
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
## 2017-02-03 DIAGNOSIS — R61 Generalized hyperhidrosis: Secondary | ICD-10-CM | POA: Insufficient documentation

## 2017-02-03 DIAGNOSIS — R002 Palpitations: Secondary | ICD-10-CM | POA: Insufficient documentation

## 2017-02-03 DIAGNOSIS — Z0001 Encounter for general adult medical examination with abnormal findings: Secondary | ICD-10-CM | POA: Insufficient documentation

## 2017-02-03 DIAGNOSIS — G5600 Carpal tunnel syndrome, unspecified upper limb: Secondary | ICD-10-CM | POA: Insufficient documentation

## 2017-02-03 DIAGNOSIS — K219 Gastro-esophageal reflux disease without esophagitis: Secondary | ICD-10-CM | POA: Insufficient documentation

## 2017-02-03 DIAGNOSIS — K625 Hemorrhage of anus and rectum: Secondary | ICD-10-CM | POA: Insufficient documentation

## 2017-02-03 NOTE — Assessment & Plan Note (Signed)
Patient reports reflux symptoms have gotten worse recently.  I discussed consistently taking her Nexium on a daily basis.  If not improving consider changing PPI.  We are already referring to GI for rectal bleeding.

## 2017-02-03 NOTE — Assessment & Plan Note (Signed)
Slight sweating of her hair and neck at night particularly after she drinks alcohol.  Suspect related to that in addition to sleeping with a heavy comforter.  She has had no weight loss.  No drenching night sweats.  She will monitor.  If not resolving with no alcohol intake consider further workup.  Will complete cancer screening.

## 2017-02-03 NOTE — Assessment & Plan Note (Signed)
TSH recently slightly elevated.  She has been taking her medication.  She has put on about 20 pounds.  We will plan to recheck in 4-6 weeks.

## 2017-02-03 NOTE — Assessment & Plan Note (Addendum)
Physical exam completed.  We will get her set up for colonoscopy.  Mammogram ordered through gynecology.  Tdap given.  Lab work as outlined below.  Breast and pelvic exam deferred to gynecology.

## 2017-02-03 NOTE — Assessment & Plan Note (Signed)
LDL worse than prior.  She has been taking her cholesterol medication.  She is going to work on diet and exercise.  Consider alteration of regimen if not improved will he recheck.  Plan to recheck in the near future after diet and exercise.

## 2017-02-03 NOTE — Assessment & Plan Note (Signed)
EKG is reassuring.  Will refer to cardiology given persistence.  Given return precautions.

## 2017-02-03 NOTE — Assessment & Plan Note (Signed)
Suspect carpal tunnel syndrome as the cause of her numbness in her hands when she wakes up.  Discussed cockup splints.  If not improving consider further evaluation.

## 2017-02-03 NOTE — Assessment & Plan Note (Signed)
Patient declined exam.  We will get her referred to GI for colonoscopy.  Recent CBC with no signs of anemia.  Given return precautions.

## 2017-02-27 ENCOUNTER — Ambulatory Visit
Admission: RE | Admit: 2017-02-27 | Discharge: 2017-02-27 | Disposition: A | Discharge status: Home / Self Care | Payer: BLUE CROSS/BLUE SHIELD | Source: Ambulatory Visit | Attending: Obstetrics and Gynecology | Admitting: Obstetrics and Gynecology

## 2017-02-27 DIAGNOSIS — R928 Other abnormal and inconclusive findings on diagnostic imaging of breast: Secondary | ICD-10-CM | POA: Diagnosis not present

## 2017-02-27 DIAGNOSIS — N6310 Unspecified lump in the right breast, unspecified quadrant: Secondary | ICD-10-CM | POA: Diagnosis present

## 2017-02-27 DIAGNOSIS — R922 Inconclusive mammogram: Secondary | ICD-10-CM | POA: Diagnosis not present

## 2017-02-28 ENCOUNTER — Ambulatory Visit: Payer: BLUE CROSS/BLUE SHIELD | Admitting: Cardiovascular Disease

## 2017-03-01 NOTE — Progress Notes (Signed)
Cardiology Office Note  Date:  03/05/2017   ID:  Christie Ramirez, DOB 1955/08/04, MRN [id number]  PCP:  Leone Haven, MD   Chief Complaint  Patient presents with  . other    Ref by Dr. Caryl Bis for rapid heart beats & GERD.  Pt. c/o rapid pounding heart beats at least daily.     HPI:  [name] is a 62 year old woman with past medical history of GERD Hypertension Anxiety "going to sleep" Hyperlipidemia Insomnia Chronic bronchitis If night sweats OSA on CPAP Mild plaque in carotids 2014 Referred by Dr. Caryl Bis for consultation of her palpitations, hyperlipidemia, cardiac risk factors   Was on ambien , stopped 6 months ago Now up every 2 hours Talks in her sleep, snores,  Completed previous sleep study does not wear CPAP Hands numb  6 months ago,  Palpitations running fast all the time,  Uncertain if it is skipping jumping or just fast Worse at nighttime when she lays flat on her left side, keeps her awake  Denies any shortness of breath on exertion  Previous carotid ultrasound reviewed with her showing intimal thickening and mild plaque in 2014  Total chol 210 on lipitor  Dad with coronary disease Grandmother with cardiac Everyone it seems has hardening of the arteries  EKG personally reviewed by myself on todays visit Shows normal sinus rhythm rate 89 bpm no significant ST or T wave changes    PMH:   has a past medical history of Anxiety, Breast cancer of upper-inner quadrant of right female breast ([place]) (05/01/2013), Cancer ([place]) (1995), Endometriosis (2009), Hyperlipidemia, Hypothyroidism, and Osteopenia.  PSH:    Past Surgical History:  Procedure Laterality Date  . ABDOMINAL HYSTERECTOMY  2005   Martin DeFrancesco,MD  . BREAST BIOPSY Right 04-02-13   INVASIVE MAMMARY CARCINOMA WITH FEATURES OF TUBULAR CARCINOMA,  . BREAST LUMPECTOMY Right 04/2013   INVASIVE MAMMARY CARCINOMA WITH FEATURES OF TUBULAR CARCINOMA,  . BREAST SURGERY  Right 05/01/13   wide excision  . colectomy  2009   secondary to endometriosis DR. Smith  . 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 No malignancy.  . COLONOSCOPY  2009   Dr. Tamala Julian  . FOOT SURGERY  right    Current Outpatient Medications  Medication Sig Dispense Refill  . albuterol (PROVENTIL HFA;VENTOLIN HFA) 108 (90 Base) MCG/ACT inhaler Inhale 2 puffs into the lungs every 6 (six) hours as needed for wheezing or shortness of breath. 1 Inhaler 0  . aspirin 81 MG tablet Take 81 mg by mouth daily.    Marland Kitchen atorvastatin (LIPITOR) 40 MG tablet TAKE 1 TABLET (40 MG TOTAL) BY MOUTH DAILY. 90 tablet 2  . CALCIUM-MAGNESIUM-ZINC PO Take by mouth 3 (three) times daily.    . Cholecalciferol (VITAMIN D-3 PO) Take 1,000 Units by mouth.    . clonazePAM (KLONOPIN) 1 MG tablet TAKE 1 TABLET BY MOUTH 3 TIMES A DAY AS NEEDED ANXIETY 90 tablet 1  . esomeprazole (NEXIUM) 20 MG capsule Take 20 mg by mouth daily at 12 noon.    Marland Kitchen GARLIC PO Take 324 mg by mouth.    . levothyroxine (SYNTHROID, LEVOTHROID) 150 MCG tablet TAKE 1 TABLET (125 MCG TOTAL) BY MOUTH DAILY. 90 tablet 1  . losartan (COZAAR) 100 MG tablet TAKE 1 TABLET (100 MG TOTAL) BY MOUTH DAILY. 90 tablet 3  . Multiple Vitamins-Minerals (MULTIVITAMIN WITH MINERALS) tablet Take 1 tablet by mouth daily.    . raloxifene (EVISTA)  60 MG tablet Take 60 mg by mouth daily.  3  . vitamin C (ASCORBIC ACID) 500 MG tablet Take 500 mg by mouth daily.    Marland Kitchen diltiazem (CARDIZEM CD) 120 MG 24 hr capsule Take 1 capsule (120 mg total) by mouth daily. 30 capsule 11  . ezetimibe (ZETIA) 10 MG tablet Take 1 tablet (10 mg total) by mouth daily. 90 tablet 3   No current facility-administered medications for this visit.      Allergies:   Codeine; Ace inhibitors; Iodinated diagnostic agents; and Penicillins   Social History:  The patient  reports that  has never smoked. she has never used smokeless tobacco. She reports that  she drinks alcohol. She reports that she does not use drugs.   Family History:   family history includes Colon polyps in her father; Heart disease in her father, mother, paternal grandfather, and paternal grandmother.    Review of Systems: Review of Systems  Constitutional: Negative.   Respiratory: Negative.   Cardiovascular: Positive for palpitations.       Tachycardia  Gastrointestinal: Negative.   Musculoskeletal: Negative.   Neurological: Negative.   Psychiatric/Behavioral: Negative.   All other systems reviewed and are negative.    PHYSICAL EXAM: VS:  BP 130/80 (BP Location: Right Arm, Patient Position: Sitting, Cuff Size: Normal)   Pulse 89   Ht 5\' 1"  (1.549 m)   Wt 159 lb 8 oz (72.3 kg)   BMI 30.14 kg/m  , BMI Body mass index is 30.14 kg/m. GEN: Well nourished, well developed, in no acute distress  HEENT: normal  Neck: no JVD, carotid bruits, or masses Cardiac: RRR; no murmurs, rubs, or gallops,no edema  Respiratory:  clear to auscultation bilaterally, normal work of breathing GI: soft, nontender, nondistended, + BS MS: no deformity or atrophy  Skin: warm and dry, no rash Neuro:  Strength and sensation are intact Psych: euthymic mood, full affect    Recent Labs: 01/26/2017: ALT 33; BUN 19; Creatinine, Ser 0.61; Hemoglobin 12.6; Platelets 343.0; Potassium 4.5; Sodium 144; TSH 5.35    Lipid Panel Lab Results  Component Value Date   CHOL 210 (H) 01/26/2017   HDL 57.50 01/26/2017   LDLCALC 123 (H) 01/26/2017   TRIG 151.0 (H) 01/26/2017      Wt Readings from Last 3 Encounters:  03/05/17 159 lb 8 oz (72.3 kg)  01/30/17 156 lb 12.8 oz (71.1 kg)  10/30/16 160 lb (72.6 kg)       ASSESSMENT AND PLAN:  Palpitations - Plan: EKG 12-Lead Suspect she is having sinus tachycardia Unable to exclude other arrhythmia, monitor has been ordered After monitor is complete suggested she change amlodipine to diltiazem 120  Hyperlipidemia with target low density  lipoprotein (LDL) cholesterol less than 100 mg/dL As below given her family history and known carotid plaque we will push for more aggressive lipid  Numbers Suggested she add Zetia 10 mg daily Daily goal LDL less than 70 Recommended weight loss, exercise  Anxiety Awake every 2 hours at nighttime Previously doing better on Ambien 6 months ago Seem to be when the palpitations started  Chronic bronchitis, unspecified chronic bronchitis type ([place]) Stable  Essential hypertension After 2-week monitor is complete suggested she change amlodipine to diltiazem 120 mg daily for rate control  Atherosclerosis of both carotid arteries Recommended a more aggressive lipid level Recommended she start Zetia 10 mg daily in addition to her Lipitor  Disposition:   F/U one month to go over results above  Total encounter time more than 60 minutes  Greater than 50% was spent in counseling and coordination of care with the patient  Patient was seen in consultation for Dr. Caryl Bis and will be referred back to his office for ongoing care of the issues detailed above   Orders Placed This Encounter  Procedures  . EKG 12-Lead     Signed, Esmond Plants, M.D., Ph.D. 03/05/2017  [place], [place]

## 2017-03-05 ENCOUNTER — Encounter: Payer: Self-pay | Admitting: Cardiovascular Disease

## 2017-03-05 ENCOUNTER — Ambulatory Visit: Payer: BLUE CROSS/BLUE SHIELD | Admitting: Cardiovascular Disease

## 2017-03-05 ENCOUNTER — Other Ambulatory Visit: Payer: Self-pay | Admitting: Family Medicine

## 2017-03-05 VITALS — BP 130/80 | HR 89 | Ht 61.0 in | Wt 159.5 lb

## 2017-03-05 DIAGNOSIS — E785 Hyperlipidemia, unspecified: Secondary | ICD-10-CM | POA: Diagnosis not present

## 2017-03-05 DIAGNOSIS — I6523 Occlusion and stenosis of bilateral carotid arteries: Secondary | ICD-10-CM

## 2017-03-05 DIAGNOSIS — F419 Anxiety disorder, unspecified: Secondary | ICD-10-CM | POA: Diagnosis not present

## 2017-03-05 DIAGNOSIS — J42 Unspecified chronic bronchitis: Secondary | ICD-10-CM

## 2017-03-05 DIAGNOSIS — I6529 Occlusion and stenosis of unspecified carotid artery: Secondary | ICD-10-CM | POA: Insufficient documentation

## 2017-03-05 DIAGNOSIS — R002 Palpitations: Secondary | ICD-10-CM | POA: Diagnosis not present

## 2017-03-05 DIAGNOSIS — I10 Essential (primary) hypertension: Secondary | ICD-10-CM | POA: Diagnosis not present

## 2017-03-05 MED ORDER — DILTIAZEM HCL ER COATED BEADS 120 MG PO CP24: 120.0000 mg | ORAL_CAPSULE | Freq: Every day | ORAL | 11 refills | Status: DC

## 2017-03-05 MED ORDER — EZETIMIBE 10 MG PO TABS: 10.0000 mg | ORAL_TABLET | Freq: Every day | ORAL | 3 refills | Status: DC

## 2017-03-05 NOTE — Progress Notes (Signed)
Zio monitor placed on patient [id number] along with instructions.

## 2017-03-05 NOTE — Patient Instructions (Addendum)
[email or web address] for meds with no insurance   Medication Instructions:   Please start zetia one a day for cholesterol  When the monitor is complete, Hold the amlodipine Start diltiazem ER 120 mg daily once a day  Call if you need the diltiazem 30s to take as needed  Labwork:  No new labs needed  Testing/Procedures:  Research CT coronary calcium score  Follow-Up: It was a pleasure seeing you in the office today. Please call us if you have new issues that need to be addressed before your next appt.  [phone]  Your physician wants you to follow-up in: 3 to 4 weeks  If you need a refill on your cardiac medications before your next appointment, please call your pharmacy.

## 2017-03-05 NOTE — Addendum Note (Signed)
Addended by: Valora Corporal on: 03/05/2017 10:09 AM   Modules accepted: Orders

## 2017-03-07 NOTE — Telephone Encounter (Signed)
Last Ov 01/30/17 last filled 09/27/16 90 1rf

## 2017-03-08 NOTE — Telephone Encounter (Signed)
faxed

## 2017-03-09 ENCOUNTER — Telehealth: Payer: Self-pay | Admitting: Family Medicine

## 2017-03-09 ENCOUNTER — Other Ambulatory Visit: Payer: Self-pay | Admitting: Family Medicine

## 2017-03-09 NOTE — Telephone Encounter (Signed)
Informed patient this was faxed on 03/08/17 but I will refax, refaxed to [facility]

## 2017-03-09 NOTE — Telephone Encounter (Signed)
Pt is requesting to have her clonazePAM (KLONOPIN) 1 MG tablet refilled. Her pharmacy is [facility] [address]

## 2017-03-09 NOTE — Telephone Encounter (Signed)
Left voicemail to notify patient that [facility] called [facility] and gave verbal order for klonopin to the pharmacy

## 2017-03-09 NOTE — Telephone Encounter (Signed)
Pt wanting to know if it is possible if the [facility] could [facility] instead of [facility]

## 2017-03-12 ENCOUNTER — Other Ambulatory Visit: Payer: Self-pay | Admitting: Family Medicine

## 2017-03-12 ENCOUNTER — Other Ambulatory Visit (INDEPENDENT_AMBULATORY_CARE_PROVIDER_SITE_OTHER): Payer: BLUE CROSS/BLUE SHIELD

## 2017-03-12 DIAGNOSIS — E785 Hyperlipidemia, unspecified: Secondary | ICD-10-CM | POA: Diagnosis not present

## 2017-03-12 DIAGNOSIS — E039 Hypothyroidism, unspecified: Secondary | ICD-10-CM | POA: Diagnosis not present

## 2017-03-12 DIAGNOSIS — Z1159 Encounter for screening for other viral diseases: Secondary | ICD-10-CM

## 2017-03-12 LAB — TSH: TSH: 15.64 u[IU]/mL — ABNORMAL HIGH (ref 0.35–4.50)

## 2017-03-12 LAB — LDL CHOLESTEROL, DIRECT: Direct LDL: 99 mg/dL

## 2017-03-13 ENCOUNTER — Other Ambulatory Visit: Payer: BLUE CROSS/BLUE SHIELD

## 2017-03-13 ENCOUNTER — Ambulatory Visit: Payer: BLUE CROSS/BLUE SHIELD | Admitting: Family

## 2017-03-13 ENCOUNTER — Encounter: Payer: Self-pay | Admitting: Family

## 2017-03-13 ENCOUNTER — Other Ambulatory Visit: Payer: Self-pay | Admitting: Family Medicine

## 2017-03-13 VITALS — BP 134/60 | HR 103 | Temp 98.3°F | Wt 161.2 lb

## 2017-03-13 DIAGNOSIS — I10 Essential (primary) hypertension: Secondary | ICD-10-CM | POA: Diagnosis not present

## 2017-03-13 DIAGNOSIS — J40 Bronchitis, not specified as acute or chronic: Secondary | ICD-10-CM | POA: Diagnosis not present

## 2017-03-13 DIAGNOSIS — E039 Hypothyroidism, unspecified: Secondary | ICD-10-CM | POA: Diagnosis not present

## 2017-03-13 LAB — HEPATITIS C ANTIBODY
Hepatitis C Ab: NONREACTIVE
SIGNAL TO CUT-OFF: 0.01 (ref <1.00)

## 2017-03-13 LAB — POC INFLUENZA A&B (BINAX/QUICKVUE)
INFLUENZA A, POC: NEGATIVE
Influenza B, POC: NEGATIVE

## 2017-03-13 MED ORDER — OSELTAMIVIR PHOSPHATE 75 MG PO CAPS: 75.0000 mg | ORAL_CAPSULE | Freq: Two times a day (BID) | ORAL | 0 refills | Status: DC

## 2017-03-13 MED ORDER — PSEUDOEPH-BROMPHEN-DM 30-2-10 MG/5ML PO SYRP: 2.5000 mL | ORAL_SOLUTION | Freq: Every evening | ORAL | 0 refills | Status: DC | PRN

## 2017-03-13 MED ORDER — LEVOTHYROXINE SODIUM 137 MCG PO TABS: 137.0000 ug | ORAL_TABLET | Freq: Every day | ORAL | 1 refills | Status: DC

## 2017-03-13 NOTE — Assessment & Plan Note (Signed)
X one day. Flu negative. Based on duration, onset, symptoms, will start tamiflu. Close vigilance as recent h/o pna

## 2017-03-13 NOTE — Assessment & Plan Note (Signed)
Patient will confirm synthroid dose

## 2017-03-13 NOTE — Assessment & Plan Note (Signed)
SBP mildly elevated. Advised to buy BP monitor and keep log. Since sick, we will not change medications at this time, particularly since DBP is on the low end.

## 2017-03-13 NOTE — Patient Instructions (Addendum)
Confirm dose of thyroid medication and send [place] a mychart message- are you taking 14mcg OR 138mcg?   We need to adjust your thyroid medicaiton.   Monitor blood pressure,  Goal is less than 130/80; if persistently higher, please make sooner follow up appointment so we can recheck you blood pressure and manage medications  Use albuterol every 6 hours for first 24 hours to get good medication into the lungs and loosen congestion; after, you may use as needed and eventually stop all together when cough resolves.  Stay home and rest for next couple of days; let us know if not better

## 2017-03-13 NOTE — Progress Notes (Signed)
Subjective:    Patient ID: Christie Ramirez, female    DOB: 1955-09-05, 62 y.o.   MRN: [id number]  CC: Lamoyne Palencia is a 62 y.o. female who presents today for an acute visit.    HPI: CC: cough x one day, worsening.  endorses chills, body aches, thick yellow mucous, frontal HA ( 'not a migraine).  Hasn't had to use to albuterol.   Nonsmoker  H/o of chronic bronchitis per patient.   PNA 09/2016  HTN- compliant with cozaar. Denies exertional chest pain or pressure, numbness or tingling radiating to left arm or jaw,dizziness, frequent headaches, changes in vision, or shortness of breath.   Palpitations- Unchanaged. currently wearing holter monitor from Dr Rockey Situ.  Hypothyroidism- taking 19mcg. Taking in morning with cup coffee. Not taking with other medications, waits one hour.      HISTORY:  Past Medical History:  Diagnosis Date  . Anxiety   . 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   . Cancer ([place]) 1995   thyroid  . Endometriosis 2009  . Hyperlipidemia   . Hypothyroidism   . Osteopenia    rt hip   Past Surgical History:  Procedure Laterality Date  . ABDOMINAL HYSTERECTOMY  2005   Martin DeFrancesco,MD  . BREAST BIOPSY Right 04-02-13   INVASIVE MAMMARY CARCINOMA WITH FEATURES OF TUBULAR CARCINOMA,  . BREAST LUMPECTOMY Right 04/2013   INVASIVE MAMMARY CARCINOMA WITH FEATURES OF TUBULAR CARCINOMA,  . BREAST SURGERY Right 05/01/13   wide excision  . colectomy  2009   secondary to endometriosis DR. Smith  . 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 No malignancy.  . COLONOSCOPY  2009   Dr. Tamala Julian  . FOOT SURGERY  right   Family History  Problem Relation Age of Onset  . Colon polyps Father        age 66  . Heart disease Father   . Heart disease Mother   . Heart disease Paternal Grandmother   . Heart  disease Paternal Grandfather   . Cancer Neg Hx   . Diabetes Neg Hx   . Breast cancer Neg Hx     Allergies: Codeine; Ace inhibitors; Iodinated diagnostic agents; and Penicillins Current Outpatient Medications on File Prior to Visit  Medication Sig Dispense Refill  . albuterol (PROVENTIL HFA;VENTOLIN HFA) 108 (90 Base) MCG/ACT inhaler Inhale 2 puffs into the lungs every 6 (six) hours as needed for wheezing or shortness of breath. 1 Inhaler 0  . amLODipine (NORVASC) 5 MG tablet   2  . aspirin 81 MG tablet Take 81 mg by mouth daily.    Marland Kitchen atorvastatin (LIPITOR) 40 MG tablet TAKE 1 TABLET (40 MG TOTAL) BY MOUTH DAILY. 90 tablet 2  . CALCIUM-MAGNESIUM-ZINC PO Take by mouth 3 (three) times daily.    . Cholecalciferol (VITAMIN D-3 PO) Take 1,000 Units by mouth.    . clonazePAM (KLONOPIN) 1 MG tablet Take 1 tablet (1 mg total) by mouth 2 (two) times daily as needed. 90 tablet 1  . diltiazem (CARDIZEM CD) 120 MG 24 hr capsule Take 1 capsule (120 mg total) by mouth daily. 30 capsule 11  . esomeprazole (NEXIUM) 20 MG capsule Take 20 mg by mouth daily at 12 noon.    . ezetimibe (ZETIA) 10 MG tablet Take 1 tablet (10 mg total) by mouth daily. 90 tablet 3  .  GARLIC PO Take 784 mg by mouth.    . levothyroxine (SYNTHROID, LEVOTHROID) 150 MCG tablet TAKE 1 TABLET (125 MCG TOTAL) BY MOUTH DAILY. 90 tablet 1  . losartan (COZAAR) 100 MG tablet TAKE 1 TABLET (100 MG TOTAL) BY MOUTH DAILY. 90 tablet 3  . Multiple Vitamins-Minerals (MULTIVITAMIN WITH MINERALS) tablet Take 1 tablet by mouth daily.    . raloxifene (EVISTA) 60 MG tablet Take 60 mg by mouth daily.  3  . vitamin C (ASCORBIC ACID) 500 MG tablet Take 500 mg by mouth daily.     No current facility-administered medications on file prior to visit.     Social History   Tobacco Use  . Smoking status: Never Smoker  . Smokeless tobacco: Never Used  Substance Use Topics  . Alcohol use: Yes    Alcohol/week: 0.0 oz    Comment: wine- daily  . Drug use:  No    Review of Systems  Constitutional: Positive for chills. Negative for fever.  HENT: Positive for congestion and sinus pressure. Negative for ear pain and sore throat.   Respiratory: Positive for cough. Negative for shortness of breath.   Cardiovascular: Negative for chest pain and palpitations.  Gastrointestinal: Negative for nausea and vomiting.  Musculoskeletal: Positive for myalgias.  Neurological: Positive for headaches.      Objective:    BP 134/60   Pulse (!) 103   Temp 98.3 F (36.8 C) (Oral)   Wt 161 lb 4 oz (73.1 kg)   SpO2 96%   BMI 30.47 kg/m    Physical Exam  Constitutional: She appears well-developed and well-nourished.  HENT:  Head: Normocephalic and atraumatic.  Right Ear: Hearing, tympanic membrane, external ear and ear canal normal. No drainage, swelling or tenderness. No foreign bodies. Tympanic membrane is not erythematous and not bulging. No middle ear effusion. No decreased hearing is noted.  Left Ear: Hearing, tympanic membrane, external ear and ear canal normal. No drainage, swelling or tenderness. No foreign bodies. Tympanic membrane is not erythematous and not bulging.  No middle ear effusion. No decreased hearing is noted.  Nose: Nose normal. No rhinorrhea. Right sinus exhibits no maxillary sinus tenderness and no frontal sinus tenderness. Left sinus exhibits no maxillary sinus tenderness and no frontal sinus tenderness.  Mouth/Throat: Uvula is midline, oropharynx is clear and moist and mucous membranes are normal. No oropharyngeal exudate, posterior oropharyngeal edema, posterior oropharyngeal erythema or tonsillar abscesses.  Eyes: Conjunctivae are normal.  Cardiovascular: Regular rhythm, normal heart sounds and normal pulses.  Pulmonary/Chest: Effort normal and breath sounds normal. She has no wheezes. She has no rhonchi. She has no rales.  Lymphadenopathy:       Head (right side): No submental, no submandibular, no tonsillar, no preauricular, no  posterior auricular and no occipital adenopathy present.       Head (left side): No submental, no submandibular, no tonsillar, no preauricular, no posterior auricular and no occipital adenopathy present.    She has no cervical adenopathy.  Neurological: She is alert.  Skin: Skin is warm and dry.  Psychiatric: She has a normal mood and affect. Her speech is normal and behavior is normal. Thought content normal.  Vitals reviewed.      Assessment & Plan:   Problem List Items Addressed This Visit      Cardiovascular and Mediastinum   Hypertension (Chronic)    SBP mildly elevated. Advised to buy BP monitor and keep log. Since sick, we will not change medications at this time, particularly since  DBP is on the low end.       Relevant Medications   amLODipine (NORVASC) 5 MG tablet     Respiratory   Bronchitis - Primary    X one day. Flu negative. Based on duration, onset, symptoms, will start tamiflu. Close vigilance as recent h/o pna      Relevant Medications   brompheniramine-pseudoephedrine-DM 30-2-10 MG/5ML syrup   oseltamivir (TAMIFLU) 75 MG capsule   Other Relevant Orders   POC Influenza A&B(BINAX/QUICKVUE) (Completed)     Endocrine   Hypothyroidism    Patient will confirm synthroid dose            I am having Christie Ramirez "Christie Ramirez" start on brompheniramine-pseudoephedrine-DM and oseltamivir. I am also having her maintain her multivitamin with minerals, aspirin, CALCIUM-MAGNESIUM-ZINC PO, Cholecalciferol (VITAMIN D-3 PO), vitamin C, GARLIC PO, levothyroxine, atorvastatin, albuterol, losartan, clonazePAM, raloxifene, esomeprazole, ezetimibe, diltiazem, and amLODipine.   Meds ordered this encounter  Medications  . brompheniramine-pseudoephedrine-DM 30-2-10 MG/5ML syrup    Sig: Take 2.5 mLs by mouth at bedtime as needed.    Dispense:  120 mL    Refill:  0    Order Specific Question:   Supervising Provider    Answer:   Deborra Medina L [[id number]]  . oseltamivir (TAMIFLU)  75 MG capsule    Sig: Take 1 capsule (75 mg total) by mouth 2 (two) times daily.    Dispense:  10 capsule    Refill:  0    Order Specific Question:   Supervising Provider    Answer:   Crecencio Mc [[id number]]    Return precautions given.   Risks, benefits, and alternatives of the medications and treatment plan prescribed today were discussed, and patient expressed understanding.   Education regarding symptom management and diagnosis given to patient on AVS.  Continue to follow with Leone Haven, MD for routine health maintenance.   Christie Ramirez and I agreed with plan.   Mable Paris, FNP

## 2017-03-15 ENCOUNTER — Encounter: Payer: Self-pay | Admitting: Family Medicine

## 2017-03-15 ENCOUNTER — Ambulatory Visit: Payer: BLUE CROSS/BLUE SHIELD | Admitting: Family Medicine

## 2017-03-15 VITALS — BP 130/82 | HR 90 | Temp 97.9°F | Wt 159.2 lb

## 2017-03-15 DIAGNOSIS — J40 Bronchitis, not specified as acute or chronic: Secondary | ICD-10-CM | POA: Diagnosis not present

## 2017-03-15 MED ORDER — AZITHROMYCIN 250 MG PO TABS: ORAL_TABLET | ORAL | 0 refills | Status: DC

## 2017-03-15 NOTE — Progress Notes (Addendum)
Patient ID: Christie Ramirez, female   DOB: 08-Mar-1955, 62 y.o.   MRN: [id number]  PCP: Leone Haven, MD  Subjective:  Christie Ramirez is a 62 y.o. year old very pleasant female patient who presents with Upper Respiratory infection symptoms including nasal congestion, ear pressure in left ear and left eye is watering,  cough that is productive of yellow sputum/dry, chills, wheezing -started: 5 days ago, symptoms are not improving -previous treatments: tamiflu was prescribed 2 days ago for symptoms; influenza test was negative. -sick contacts/travel/risks: denies flu exposure.  No recent sick contact exposure. She did not get a flu vaccine this year -Hx of: allergies She is not a smoker  ROS-denies fever, SOB, NVD, tooth pain  Pertinent Past Medical History- HTN, OSA  Medications- reviewed  Current Outpatient Medications  Medication Sig Dispense Refill  . albuterol (PROVENTIL HFA;VENTOLIN HFA) 108 (90 Base) MCG/ACT inhaler Inhale 2 puffs into the lungs every 6 (six) hours as needed for wheezing or shortness of breath. 1 Inhaler 0  . amLODipine (NORVASC) 5 MG tablet   2  . aspirin 81 MG tablet Take 81 mg by mouth daily.    Marland Kitchen atorvastatin (LIPITOR) 40 MG tablet TAKE 1 TABLET (40 MG TOTAL) BY MOUTH DAILY. 90 tablet 2  . brompheniramine-pseudoephedrine-DM 30-2-10 MG/5ML syrup Take 2.5 mLs by mouth at bedtime as needed. 120 mL 0  . CALCIUM-MAGNESIUM-ZINC PO Take by mouth 3 (three) times daily.    . Cholecalciferol (VITAMIN D-3 PO) Take 1,000 Units by mouth.    . clonazePAM (KLONOPIN) 1 MG tablet Take 1 tablet (1 mg total) by mouth 2 (two) times daily as needed. 90 tablet 1  . diltiazem (CARDIZEM CD) 120 MG 24 hr capsule Take 1 capsule (120 mg total) by mouth daily. 30 capsule 11  . esomeprazole (NEXIUM) 20 MG capsule Take 20 mg by mouth daily at 12 noon.    . ezetimibe (ZETIA) 10 MG tablet Take 1 tablet (10 mg total) by mouth daily. 90 tablet 3  . GARLIC PO Take 329 mg by mouth.    .  levothyroxine (SYNTHROID, LEVOTHROID) 137 MCG tablet Take 1 tablet (137 mcg total) by mouth daily before breakfast. 90 tablet 1  . losartan (COZAAR) 100 MG tablet TAKE 1 TABLET (100 MG TOTAL) BY MOUTH DAILY. 90 tablet 3  . Multiple Vitamins-Minerals (MULTIVITAMIN WITH MINERALS) tablet Take 1 tablet by mouth daily.    Marland Kitchen oseltamivir (TAMIFLU) 75 MG capsule Take 1 capsule (75 mg total) by mouth 2 (two) times daily. 10 capsule 0  . raloxifene (EVISTA) 60 MG tablet Take 60 mg by mouth daily.  3  . vitamin C (ASCORBIC ACID) 500 MG tablet Take 500 mg by mouth daily.     No current facility-administered medications for this visit.     Objective: BP (!) 154/96 (BP Location: Left Arm, Patient Position: Sitting, Cuff Size: Normal)   Pulse 90   Temp 97.9 F (36.6 C) (Oral)   Wt 159 lb 4 oz (72.2 kg)   SpO2 97%   BMI 30.09 kg/m  Gen: NAD, resting comfortably HEENT: Turbinates erythematous, TMs normal bilaterally, pharynx mildly erythematous with no tonsilar exudate or edema, no sinus tenderness CV: RRR no murmurs rubs or gallops Lungs: Minimal scattered wheezing present. No crackles or rhonchi Abdomen: soft/nontender/nondistended/normal bowel sounds. No rebound or guarding.  Ext: no edema Skin: warm, dry, no rash Neuro: grossly normal, moves all extremities  Assessment/Plan: 1. Bronchitis  - azithromycin (ZITHROMAX) 250 MG tablet; Take 2 tablets  at once today, then one tablet daily for 4 days.  Dispense: 6 tablet; Refill: 0  Symptoms are worsening per patient; exam is reassuring; VSS, O2 sat is 97%, mild wheezing noted. Symptoms are most likely associated with bronchitis.We discussed that bronchitis is most likely viral in nature and exam and VSS are reassuring that this is not likely pneumonia. Offered a prednisone taper which she declined. Provide patient with a prescription for azithromycin that she can use if symptoms do not improve in the next 2 to 3 days. She will continue cough medication  prescribed two days ago. She also has albuterol to use as well.  Recheck of BP was 130/82. Suspect cuff size may have been an issue with first reading. Return precautions provided.    We discussed that we did not find any infection that had higher probability of being bacterial such as pneumonia or strep throat. We discussed signs that bacterial infection may have developed particularly fever or shortness of breath. Likely course of 1-2 weeks. Patient is contagious and advised good handwashing and consideration of mask If going to be in public places.   Finally, we reviewed reasons to return to care including if symptoms worsen or persist or new concerns arise- once again particularly shortness of breath or fever.   Laurita Quint, FNP

## 2017-03-15 NOTE — Patient Instructions (Addendum)
Your symptoms today are most likely caused by viral illness. Please drink plenty of water enough for your urine to be pale yellow or clear. You may use Tylenol 325 mg every 6 hours as needed. Continue with cough medication that was prescribed by Joycelyn Schmid. Follow-up for evaluation if your symptoms are not improving in 3-4 days, worsen, or you develop a fever greater than 100.   Acute Bronchitis, Adult Acute bronchitis is when air tubes (bronchi) in the lungs suddenly get swollen. The condition can make it hard to breathe. It can also cause these symptoms:  A cough.  Coughing up clear, yellow, or green mucus.  Wheezing.  Chest congestion.  Shortness of breath.  A fever.  Body aches.  Chills.  A sore throat.  Follow these instructions at home: Medicines  Take over-the-counter and prescription medicines only as told by your doctor.  If you were prescribed an antibiotic medicine, take it as told by your doctor. Do not stop taking the antibiotic even if you start to feel better. General instructions  Rest.  Drink enough fluids to keep your pee (urine) clear or pale yellow.  Avoid smoking and secondhand smoke. If you smoke and you need help quitting, ask your doctor. Quitting will help your lungs heal faster.  Use an inhaler, cool mist vaporizer, or humidifier as told by your doctor.  Keep all follow-up visits as told by your doctor. This is important. How is this prevented? To lower your risk of getting this condition again:  Wash your hands often with soap and water. If you cannot use soap and water, use hand sanitizer.  Avoid contact with people who have cold symptoms.  Try not to touch your hands to your mouth, nose, or eyes.  Make sure to get the flu shot every year.  Contact a doctor if:  Your symptoms do not get better in 2 weeks. Get help right away if:  You cough up blood.  You have chest pain.  You have very bad shortness of breath.  You become  dehydrated.  You faint (pass out) or keep feeling like you are going to pass out.  You keep throwing up (vomiting).  You have a very bad headache.  Your fever or chills gets worse. This information is not intended to replace advice given to you by your health care provider. Make sure you discuss any questions you have with your health care provider. Document Released: 06/28/2007 Document Revised: 08/18/2015 Document Reviewed: 06/30/2015 Elsevier Interactive Patient Education  [employer].

## 2017-03-25 NOTE — Progress Notes (Deleted)
Cardiology Office Note  Date:  03/25/2017   ID:  Christie Ramirez, DOB [birth year], MRN [id number]  PCP:  Leone Haven, MD   No chief complaint on file.   HPI:  Christie Ramirez is a 62 year old woman with past medical history of GERD Hypertension Anxiety "going to sleep" Hyperlipidemia Insomnia Chronic bronchitis If night sweats OSA on CPAP Mild plaque in carotids 2014 mixed plaque Who presents for f/u  palpitations, hyperlipidemia, cardiac risk factors   Was on ambien , stopped 6 months ago Now up every 2 hours Talks in her sleep, snores,  Completed previous sleep study does not wear CPAP Hands numb  6 months ago,  Palpitations running fast all the time,  Uncertain if it is skipping jumping or just fast Worse at nighttime when she lays flat on her left side, keeps her awake  Denies any shortness of breath on exertion  Previous carotid ultrasound reviewed with her showing intimal thickening and mild plaque in 2014  Total chol 210 on lipitor  Dad with coronary disease Grandmother with cardiac Everyone it seems has hardening of the arteries  EKG personally reviewed by myself on todays visit Shows normal sinus rhythm rate 89 bpm no significant ST or T wave changes    PMH:   has a past medical history of Anxiety, Breast cancer of upper-inner quadrant of right female breast ([place]) (05/01/2013), Cancer ([place]) (1995), Endometriosis (2009), Hyperlipidemia, Hypothyroidism, and Osteopenia.  PSH:    Past Surgical History:  Procedure Laterality Date  . ABDOMINAL HYSTERECTOMY  2005   Martin DeFrancesco,MD  . BREAST BIOPSY Right 04-02-13   INVASIVE MAMMARY CARCINOMA WITH FEATURES OF TUBULAR CARCINOMA,  . BREAST LUMPECTOMY Right 04/2013   INVASIVE MAMMARY CARCINOMA WITH FEATURES OF TUBULAR CARCINOMA,  . BREAST SURGERY Right 05/01/13   wide excision  . colectomy  2009   secondary to endometriosis DR. Smith  . COLON SURGERY  December 2008   right hemicolectomy for  cecal mass identified as endometrioma. No malignancy.  . COLONOSCOPY  2009   Dr. Tamala Julian  . FOOT SURGERY  right    Current Outpatient Medications  Medication Sig Dispense Refill  . albuterol (PROVENTIL HFA;VENTOLIN HFA) 108 (90 Base) MCG/ACT inhaler Inhale 2 puffs into the lungs every 6 (six) hours as needed for wheezing or shortness of breath. 1 Inhaler 0  . amLODipine (NORVASC) 5 MG tablet   2  . aspirin 81 MG tablet Take 81 mg by mouth daily.    Marland Kitchen atorvastatin (LIPITOR) 40 MG tablet TAKE 1 TABLET (40 MG TOTAL) BY MOUTH DAILY. 90 tablet 2  . azithromycin (ZITHROMAX) 250 MG tablet Take 2 tablets at once today, then one tablet daily for 4 days. 6 tablet 0  . brompheniramine-pseudoephedrine-DM 30-2-10 MG/5ML syrup Take 2.5 mLs by mouth at bedtime as needed. 120 mL 0  . CALCIUM-MAGNESIUM-ZINC PO Take by mouth 3 (three) times daily.    . Cholecalciferol (VITAMIN D-3 PO) Take 1,000 Units by mouth.    . clonazePAM (KLONOPIN) 1 MG tablet Take 1 tablet (1 mg total) by mouth 2 (two) times daily as needed. 90 tablet 1  . diltiazem (CARDIZEM CD) 120 MG 24 hr capsule Take 1 capsule (120 mg total) by mouth daily. 30 capsule 11  . esomeprazole (NEXIUM) 20 MG capsule Take 20 mg by mouth daily at 12 noon.    . ezetimibe (ZETIA) 10 MG tablet Take 1 tablet (10 mg total) by mouth daily. 90 tablet 3  . GARLIC PO Take 409  mg by mouth.    . levothyroxine (SYNTHROID, LEVOTHROID) 137 MCG tablet Take 1 tablet (137 mcg total) by mouth daily before breakfast. 90 tablet 1  . losartan (COZAAR) 100 MG tablet TAKE 1 TABLET (100 MG TOTAL) BY MOUTH DAILY. 90 tablet 3  . Multiple Vitamins-Minerals (MULTIVITAMIN WITH MINERALS) tablet Take 1 tablet by mouth daily.    Marland Kitchen oseltamivir (TAMIFLU) 75 MG capsule Take 1 capsule (75 mg total) by mouth 2 (two) times daily. 10 capsule 0  . raloxifene (EVISTA) 60 MG tablet Take 60 mg by mouth daily.  3  . vitamin C (ASCORBIC ACID) 500 MG tablet Take 500 mg by mouth daily.     No current  facility-administered medications for this visit.      Allergies:   Codeine; Ace inhibitors; Iodinated diagnostic agents; and Penicillins   Social History:  The patient  reports that  has never smoked. she has never used smokeless tobacco. She reports that she drinks alcohol. She reports that she does not use drugs.   Family History:   family history includes Colon polyps in her father; Heart disease in her father, mother, paternal grandfather, and paternal grandmother.    Review of Systems: Review of Systems  Constitutional: Negative.   Respiratory: Negative.   Cardiovascular: Positive for palpitations.       Tachycardia  Gastrointestinal: Negative.   Musculoskeletal: Negative.   Neurological: Negative.   Psychiatric/Behavioral: Negative.   All other systems reviewed and are negative.    PHYSICAL EXAM: VS:  There were no vitals taken for this visit. , BMI There is no height or weight on file to calculate BMI. GEN: Well nourished, well developed, in no acute distress  HEENT: normal  Neck: no JVD, carotid bruits, or masses Cardiac: RRR; no murmurs, rubs, or gallops,no edema  Respiratory:  clear to auscultation bilaterally, normal work of breathing GI: soft, nontender, nondistended, + BS MS: no deformity or atrophy  Skin: warm and dry, no rash Neuro:  Strength and sensation are intact Psych: euthymic mood, full affect    Recent Labs: 01/26/2017: ALT 33; BUN 19; Creatinine, Ser 0.61; Hemoglobin 12.6; Platelets 343.0; Potassium 4.5; Sodium 144 03/12/2017: TSH 15.64    Lipid Panel Lab Results  Component Value Date   CHOL 210 (H) 01/26/2017   HDL 57.50 01/26/2017   LDLCALC 123 (H) 01/26/2017   TRIG 151.0 (H) 01/26/2017      Wt Readings from Last 3 Encounters:  03/15/17 159 lb 4 oz (72.2 kg)  03/13/17 161 lb 4 oz (73.1 kg)  03/05/17 159 lb 8 oz (72.3 kg)       ASSESSMENT AND PLAN:  Palpitations - Plan: EKG 12-Lead Suspect she is having sinus tachycardia Unable  to exclude other arrhythmia, monitor has been ordered After monitor is complete suggested she change amlodipine to diltiazem 120  Hyperlipidemia with target low density lipoprotein (LDL) cholesterol less than 100 mg/dL As below given her family history and known carotid plaque we will push for more aggressive lipid  Numbers  Suggested she add Zetia 10 mg daily Daily goal LDL less than 70 Recommended weight loss, exercise  Anxiety Awake every 2 hours at nighttime Previously doing better on Ambien 6 months ago Seem to be when the palpitations started  Chronic bronchitis, unspecified chronic bronchitis type ([place]) Stable  Essential hypertension After 2-week monitor is complete suggested she change amlodipine to diltiazem 120 mg daily for rate control  Atherosclerosis of both carotid arteries Recommended a more aggressive lipid level  Recommended she start Zetia 10 mg daily in addition to her Lipitor  Disposition:   F/U one month to go over results above   Total encounter time more than 60 minutes  Greater than 50% was spent in counseling and coordination of care with the patient  Patient was seen in consultation for Dr. Caryl Bis and will be referred back to his office for ongoing care of the issues detailed above   No orders of the defined types were placed in this encounter.    Signed, Esmond Plants, M.D., Ph.D. 03/25/2017  [place], [place]

## 2017-03-27 ENCOUNTER — Ambulatory Visit: Payer: BLUE CROSS/BLUE SHIELD | Admitting: Cardiovascular Disease

## 2017-04-10 ENCOUNTER — Telehealth: Payer: Self-pay | Admitting: Family Medicine

## 2017-04-10 NOTE — Telephone Encounter (Signed)
Please advise. Thank you

## 2017-04-10 NOTE — Telephone Encounter (Signed)
Please advise 

## 2017-04-10 NOTE — Telephone Encounter (Signed)
We should have her into the office to follow-up on her anxiety to discuss further and look at potential treatment options.  Thanks.

## 2017-04-10 NOTE — Telephone Encounter (Signed)
Copied from [place]. Topic: Quick Communication - See Telephone Encounter >> Apr 10, 2017 10:33 AM Robina Ade, Helene Kelp D wrote: CRM for notification. See Telephone encounter for: 04/10/17. Patient called and said that she needs a work & dog note saying that she has anxiety that way she can bring her dog to work. If there are any questions please call patient back, thanks.

## 2017-04-10 NOTE — Telephone Encounter (Signed)
Left message to return clal, ok for pec to speak to patient and schedule follow up for anxiety with Dr.Sonnenberg

## 2017-04-11 ENCOUNTER — Telehealth: Payer: Self-pay | Admitting: Cardiovascular Disease

## 2017-04-11 NOTE — Telephone Encounter (Signed)
Left voicemail message requesting that patient call us back regarding her monitor.

## 2017-04-12 ENCOUNTER — Other Ambulatory Visit (INDEPENDENT_AMBULATORY_CARE_PROVIDER_SITE_OTHER): Payer: BLUE CROSS/BLUE SHIELD

## 2017-04-12 DIAGNOSIS — E039 Hypothyroidism, unspecified: Secondary | ICD-10-CM

## 2017-04-12 LAB — TSH: TSH: 6.03 u[IU]/mL — AB (ref 0.35–4.50)

## 2017-04-12 NOTE — Telephone Encounter (Signed)
Left voicemail message on both home and work number for patient to call back regarding monitor.

## 2017-04-13 NOTE — Telephone Encounter (Signed)
scheduled

## 2017-04-16 ENCOUNTER — Encounter: Payer: Self-pay | Admitting: *Deleted

## 2017-04-16 NOTE — Telephone Encounter (Signed)
Unable to reach patient. Letter mailed to address on file for her to please call regarding her zio monitor.

## 2017-04-16 NOTE — Telephone Encounter (Signed)
Left voicemail message to call back  

## 2017-04-17 ENCOUNTER — Telehealth: Payer: Self-pay | Admitting: Radiology

## 2017-04-17 ENCOUNTER — Telehealth: Payer: Self-pay

## 2017-04-17 DIAGNOSIS — E039 Hypothyroidism, unspecified: Secondary | ICD-10-CM

## 2017-04-17 NOTE — Addendum Note (Signed)
Addended by: Caryl Bis, Jenet Durio G on: 04/17/2017 01:09 PM   Modules accepted: Orders

## 2017-04-17 NOTE — Telephone Encounter (Signed)
PT coming in for labs tomorrow, please place future orders. Thank you.  

## 2017-04-17 NOTE — Telephone Encounter (Signed)
Order placed

## 2017-04-17 NOTE — Telephone Encounter (Signed)
-----   Message from Leone Haven, MD sent at 04/14/2017 11:42 AM EDT ----- Noted.  We should plan on rechecking her TSH in another 4 weeks.  We will leave her medication as is right now and if still slightly elevated at that time we will increase her dose.  Please place an order for a TSH for hypothyroidism.  Thanks.

## 2017-04-18 ENCOUNTER — Encounter: Payer: Self-pay | Admitting: Family Medicine

## 2017-04-18 ENCOUNTER — Other Ambulatory Visit: Payer: BLUE CROSS/BLUE SHIELD

## 2017-04-18 ENCOUNTER — Other Ambulatory Visit: Payer: Self-pay

## 2017-04-18 ENCOUNTER — Ambulatory Visit (INDEPENDENT_AMBULATORY_CARE_PROVIDER_SITE_OTHER): Payer: BLUE CROSS/BLUE SHIELD | Admitting: Family Medicine

## 2017-04-18 DIAGNOSIS — I10 Essential (primary) hypertension: Secondary | ICD-10-CM | POA: Diagnosis not present

## 2017-04-18 DIAGNOSIS — F419 Anxiety disorder, unspecified: Secondary | ICD-10-CM | POA: Diagnosis not present

## 2017-04-18 MED ORDER — ESCITALOPRAM OXALATE 10 MG PO TABS: 10.0000 mg | ORAL_TABLET | Freq: Every day | ORAL | 2 refills | Status: DC

## 2017-04-18 NOTE — Assessment & Plan Note (Signed)
Has been well controlled previously.  She will monitor at home and if running elevated discuss with a new physician in [place].

## 2017-04-18 NOTE — Progress Notes (Signed)
  Christie Rumps, MD Phone: [phone]  Christie Ramirez is a 62 y.o. female who presents today for follow-up.  Anxiety: Taking Klonopin once daily.  This does help.  She is a little more anxious recently as she is moving to a new job at [employer] in [place].  She panics at times.  The Klonopin helps.  She was on Lexapro previously though it is not clear why this was discontinued.  She notes no depression.  Blood pressure is a little elevated today though this may be related to anxiety.  She does take her blood pressure medication.  No chest pain or shortness of breath.  Social History   Tobacco Use  Smoking Status Never Smoker  Smokeless Tobacco Never Used     ROS see history of present illness  Objective  Physical Exam Vitals:   04/18/17 1458 04/18/17 1512  BP: (!) 132/92   Pulse: (!) 111 96  Temp: 98.3 F (36.8 C)   SpO2: 94%     BP Readings from Last 3 Encounters:  04/18/17 (!) 132/92  03/15/17 130/82  03/13/17 134/60   Wt Readings from Last 3 Encounters:  04/18/17 161 lb 12.8 oz (73.4 kg)  03/15/17 159 lb 4 oz (72.2 kg)  03/13/17 161 lb 4 oz (73.1 kg)    Physical Exam  Constitutional: No distress.  Cardiovascular: Normal rate, regular rhythm and normal heart sounds.  Pulmonary/Chest: Effort normal and breath sounds normal.  Musculoskeletal: She exhibits no edema.  Neurological: She is alert. Gait normal.  Skin: Skin is warm and dry. She is not diaphoretic.     Assessment/Plan: Please see individual problem list.  Anxiety Has been having some more anxiety related to her upcoming move.  We will restart Lexapro.  She will continue clonazepam.  Counseled on risk of drowsiness with this.  Advised not to drink alcohol with this.  Advised to establish care with a new provider in [place] in the next 2-3 months.  Hypertension Has been well controlled previously.  She will monitor at home and if running elevated discuss with a  new physician in [place].  No orders of the defined types were placed in this encounter.   Meds ordered this encounter  Medications  . escitalopram (LEXAPRO) 10 MG tablet    Sig: Take 1 tablet (10 mg total) by mouth daily.    Dispense:  90 tablet    Refill:  2     Christie Rumps, MD [place]

## 2017-04-18 NOTE — Assessment & Plan Note (Addendum)
Has been having some more anxiety related to her upcoming move.  We will restart Lexapro.  She will continue clonazepam.  Counseled on risk of drowsiness with this.  Advised not to drink alcohol with this.  Advised to establish care with a new provider in [place] in the next 2-3 months.

## 2017-04-18 NOTE — Patient Instructions (Addendum)
Nice to see you. We will start you back on Lexapro. Please monitor your anxiety.  Please be careful when taking the Klonopin and do not drink alcohol with this. Please establish with a new physician in [place].

## 2017-04-19 ENCOUNTER — Telehealth: Payer: Self-pay

## 2017-04-19 NOTE — Telephone Encounter (Signed)
-----   Message from Leone Haven, MD sent at 04/18/2017  6:32 PM EDT ----- Can you let the patient know it looks like she is due for her colonoscopy?  We can arrange for this to be done here though she is moving and if she would prefer to have it done after moving she will need to get this taken care of when she moves to [place].  Thanks.  Eric.

## 2017-04-19 NOTE — Telephone Encounter (Signed)
Left message to return call to inform patient she is due for a colonoscopy and to ask if she would like to have it done here or in [place] after she moves. [place] for pec to speak to patient.

## 2017-05-04 ENCOUNTER — Encounter: Payer: Self-pay | Admitting: *Deleted

## 2017-05-04 NOTE — Telephone Encounter (Signed)
Sent mychart message

## 2017-05-18 NOTE — Telephone Encounter (Signed)
Unread mychart message mailed to patient 

## 2017-05-21 ENCOUNTER — Telehealth: Payer: Self-pay

## 2017-05-21 NOTE — Telephone Encounter (Signed)
Left message to notify patient the letter is at the front desk

## 2017-05-21 NOTE — Telephone Encounter (Signed)
Copied from [place] [phone]. Topic: [facility] >> May 21, 2017  3:23 PM Vernona Rieger wrote: Reason for CRM: Patient said that Dr Caryl Bis wrote her a letter for her anxiety for her to have a service animal. She said that she is in the process of moving and has lost it. She wants to know if she can have another one & she can pick that up on Friday may 3rd after her lab appointment.

## 2017-05-25 ENCOUNTER — Other Ambulatory Visit (INDEPENDENT_AMBULATORY_CARE_PROVIDER_SITE_OTHER): Payer: 59

## 2017-05-25 ENCOUNTER — Telehealth: Payer: Self-pay | Admitting: Family Medicine

## 2017-05-25 DIAGNOSIS — E039 Hypothyroidism, unspecified: Secondary | ICD-10-CM | POA: Diagnosis not present

## 2017-05-25 NOTE — Telephone Encounter (Signed)
Pt came in for labs and needs a signature of verification for her service animal  Needs to be faxed to [phone] Placed in Dr. Tharon Aquas color folder upfront

## 2017-05-26 LAB — TSH: TSH: 3.12 u[IU]/mL (ref 0.35–4.50)

## 2017-05-28 NOTE — Telephone Encounter (Signed)
Left message to return call, ok for pec to speak with patient and inform her of DR.Sonnenbergs message below  I have reviewed the form that she needs filled out.  It appears that she has to have a disability for me to sign this.  Based on our prior conversations regarding her anxiety and the definition that they provide within this form I do not think that she has a disability as she has to have an impairment that prevents or severely restricts her from performing activities that are of central importance in most people's daily lives's for her to meet the criteria.  Based on our prior conversations I do not think she meets this criteria.  At this time I will not be able to sign the form.  Please inform the patient of this.

## 2017-05-28 NOTE — Telephone Encounter (Signed)
Placed in red folder  

## 2017-05-28 NOTE — Telephone Encounter (Signed)
I have reviewed the form that she needs filled out.  It appears that she has to have a disability for me to sign this.  Based on our prior conversations regarding her anxiety and the definition that they provide within this form I do not think that she has a disability as she has to have an impairment that prevents or severely restricts her from performing activities that are of central importance in most people's daily lives's for her to meet the criteria.  Based on our prior conversations I do not think she meets this criteria.  At this time I will not be able to sign the form.  Please inform the patient of this.

## 2017-05-29 NOTE — Telephone Encounter (Signed)
° °  Spoke with pt and read what Dr Caryl Bis wrote and she was ok and understood

## 2017-06-28 ENCOUNTER — Other Ambulatory Visit: Payer: Self-pay | Admitting: Cardiovascular Disease

## 2017-07-19 IMAGING — MG MM DIGITAL DIAGNOSTIC UNILAT*R* W/ TOMO W/ CAD
6 of 9 series · 6 of 21 positions shown · non-contrast
Comparison: Previous exam(s).

CLINICAL DATA: 60-year-old female with history of right breast
lumpectomy in 0367 presenting for evaluation of a palpable lump in
the subareolar right breast.

EXAM:
2D DIGITAL DIAGNOSTIC UNILATERAL RIGHT MAMMOGRAM WITH CAD AND
ADJUNCT TOMO
RIGHT BREAST ULTRASOUND

[R CC (1 of 2)]
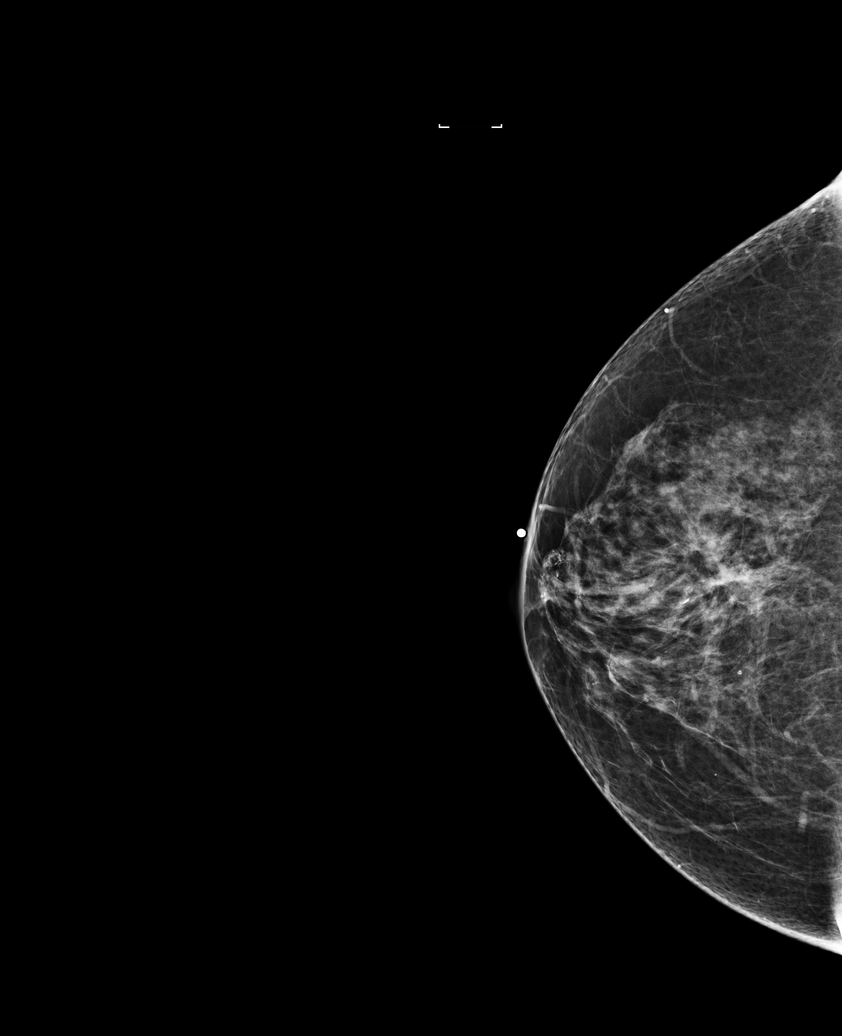

[R MLO]
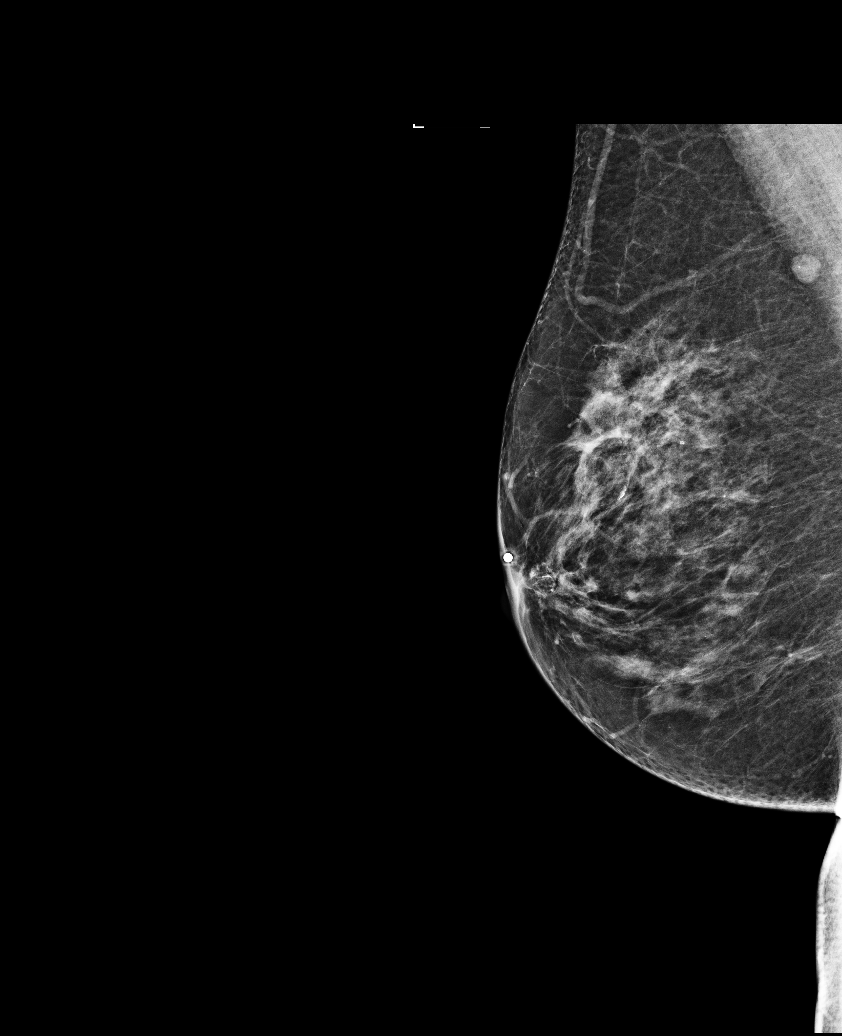

[R MLO synth-2D]
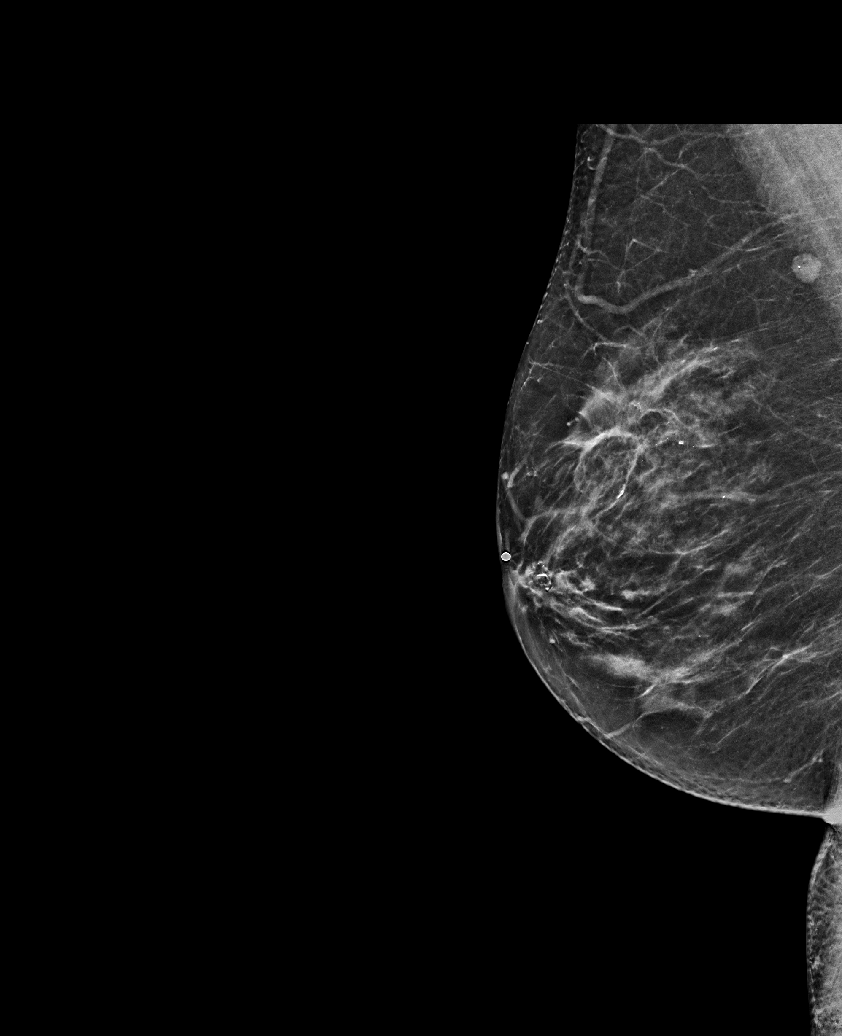

[R CC synth-2D (1 of 2)]
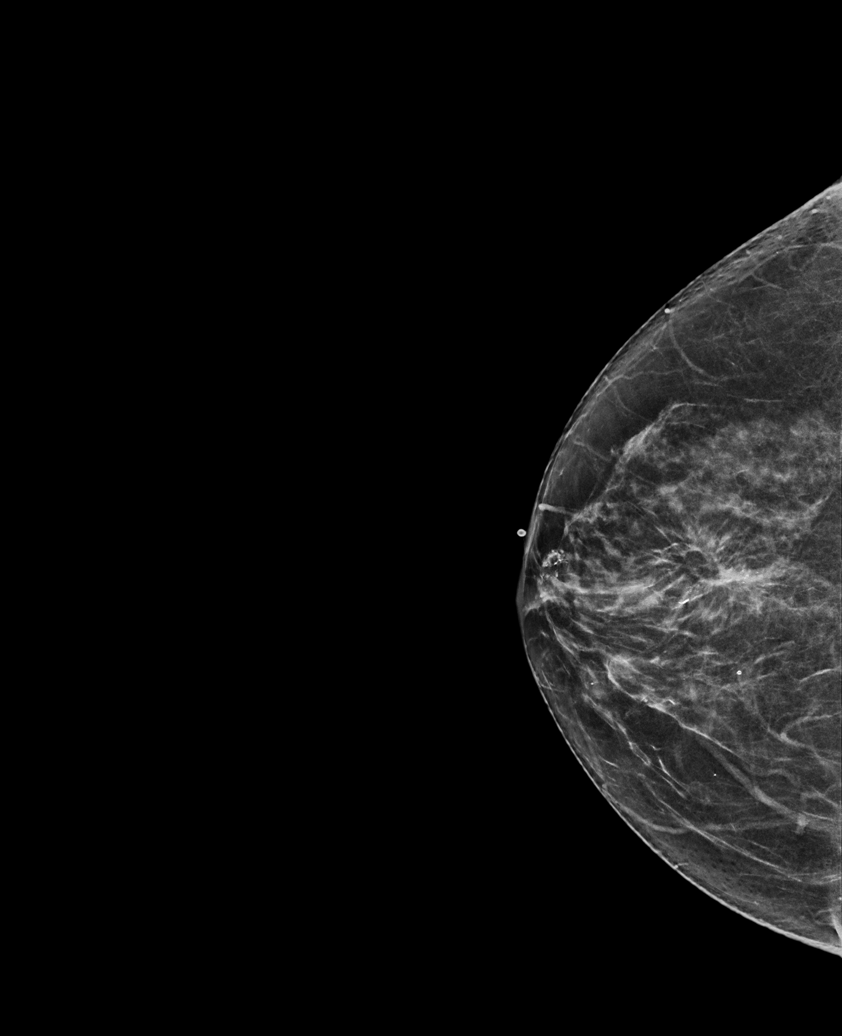

[R CC synth-2D (2 of 2)]
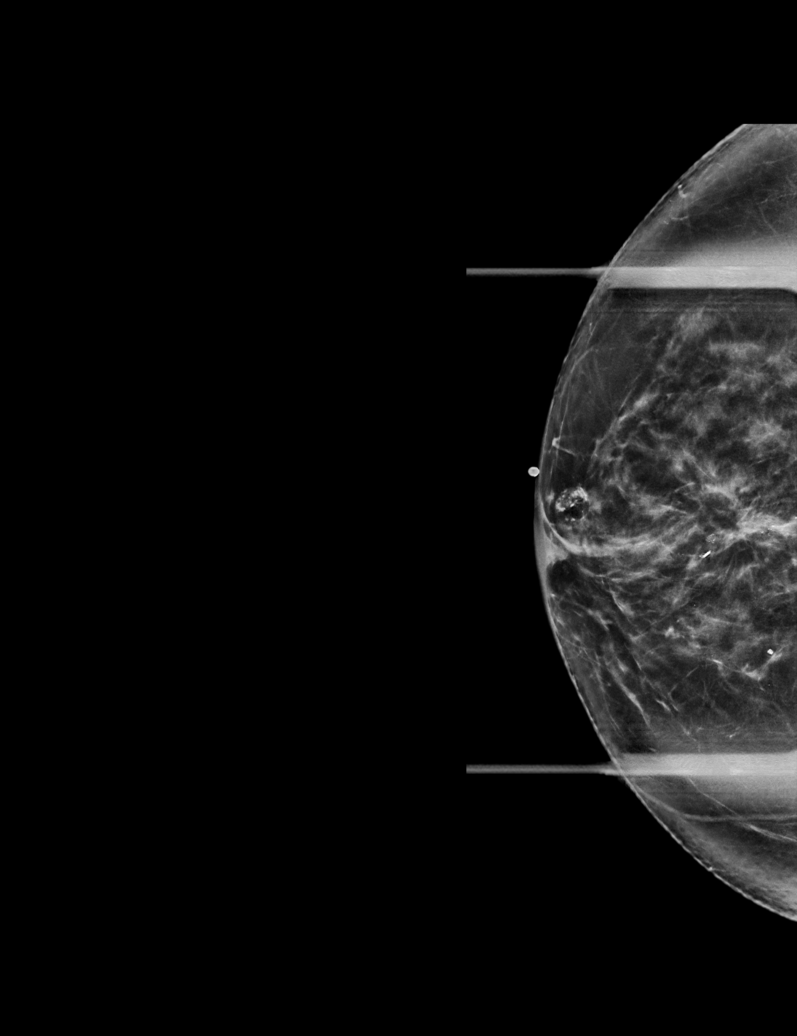

[R CC (2 of 2)]
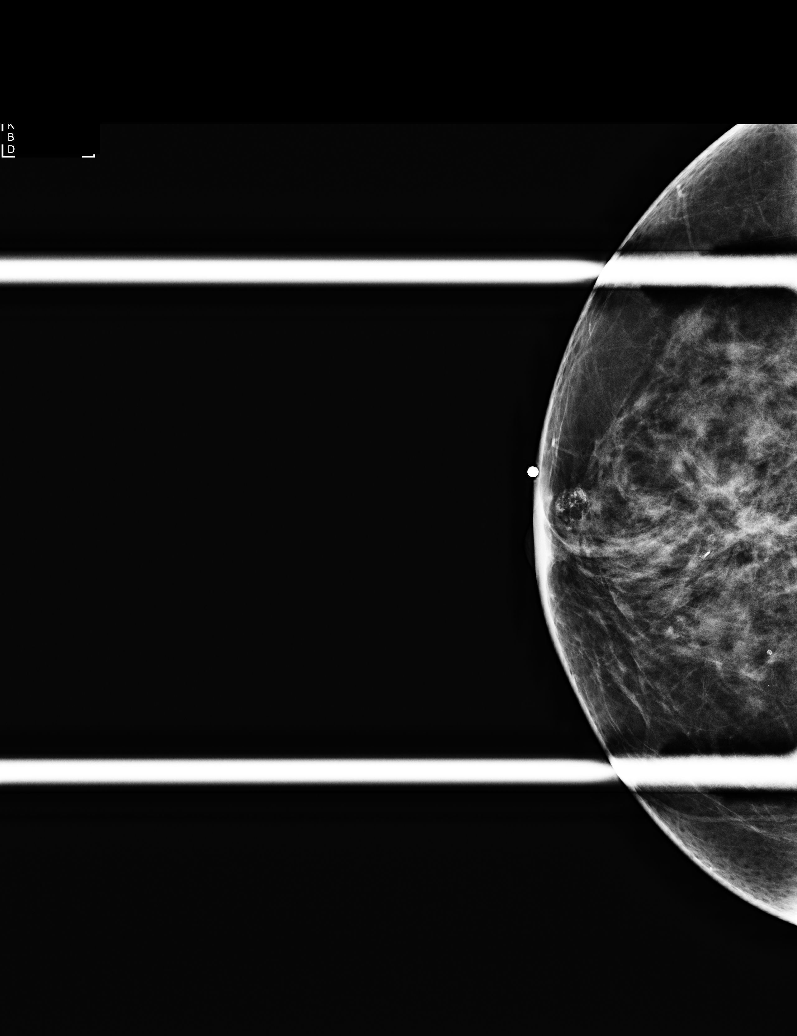

[6 of 21 positions shown; findings below may reference images not displayed]

ACR Breast Density Category b: There are scattered areas of
fibroglandular density.
FINDINGS: A BB has been placed along the upper-outer quadrant of the right
breast. Deep to the marker, there is a fat containing mass with
heterogeneous coarse calcifications which appear to be developing
around the perimeter of the mass. The lumpectomy site in the
superior right breast appear stable.

Mammographic images were processed with CAD.

Physical exam of the palpable site adjacent to the nipple
demonstrates a pea-sized superficial firm lump.

Ultrasound targeted to the palpable site of concern demonstrates an
oval hypoechoic mass with indistinct margins. Due to the indistinct
margins and positioning adjacent to the nipple, accurate
measurements are difficult to obtain, but are consistent with the
size of the mass identified mammographically.
IMPRESSION: 1. The palpable mass in the subareolar right breast is probably
benign, most compatible with developing fat necrosis.

RECOMMENDATION:
Follow-up bilateral diagnostic mammogram and right breast ultrasound
is recommended in June 2016.

I have discussed the findings and recommendations with the patient.
Results were also provided in writing at the conclusion of the
visit. If applicable, a reminder letter will be sent to the patient
regarding the next appointment.

BI-RADS CATEGORY  3: Probably benign.

## 2017-07-24 ENCOUNTER — Other Ambulatory Visit: Payer: Self-pay | Admitting: Internal Medicine

## 2017-07-25 ENCOUNTER — Other Ambulatory Visit: Payer: Self-pay | Admitting: Internal Medicine

## 2017-07-25 ENCOUNTER — Other Ambulatory Visit: Payer: Self-pay

## 2017-07-25 MED ORDER — EZETIMIBE 10 MG PO TABS: 10.0000 mg | ORAL_TABLET | Freq: Every day | ORAL | 3 refills | Status: DC

## 2017-08-01 ENCOUNTER — Ambulatory Visit: Payer: BLUE CROSS/BLUE SHIELD | Admitting: Family Medicine

## 2017-08-10 DIAGNOSIS — M810 Age-related osteoporosis without current pathological fracture: Secondary | ICD-10-CM | POA: Insufficient documentation

## 2017-08-31 ENCOUNTER — Other Ambulatory Visit: Payer: Self-pay | Admitting: Family Medicine

## 2017-09-20 ENCOUNTER — Telehealth (HOSPITAL_BASED_OUTPATIENT_CLINIC_OR_DEPARTMENT_OTHER): Payer: Self-pay

## 2017-09-20 NOTE — Telephone Encounter (Signed)
Physician/ Location Preference:  Location Preference: [facility]    Physician Preference: No Preference    Referral:  Referring Provider Surin Rodino, ANP    Is Referral required per insurance No      History:  Personal Hx of Breast Cancer: Yes 2015 Right Breast Cancer    Personal History of Ovarian Cancer:No      Personal History of any other cancer: Yes Thyroid cancer 1996;    HPV - uterus removed     Family Hx of Breast Cancer :No    Family History of Ovarian Cancer: No        Imaging History:  Mammo:04/2016  [place]; [facility] [address] [place] [address]  [phone]    Breast MRI: No      Breast Ultrasound: No        Biopsy History:  no    Breast Surgery:  Lumpectomy right 2015     Misc Testing History:  Genetic Testing:No      Other:     Are there patient owned records that will be brought to the first appointment?no    Are you using medications for breast Pain- No

## 2017-09-26 ENCOUNTER — Telehealth (HOSPITAL_BASED_OUTPATIENT_CLINIC_OR_DEPARTMENT_OTHER): Payer: Self-pay

## 2017-09-26 NOTE — Telephone Encounter (Signed)
On 9/4 after faxing request for release of records to Dr. Donnalee Curry, I was told they will need at least a verbal from the patient.  I have left a voice message with patient to please call Dr. Rutherford Nail office and ask for [facility].  Once they hear from her, they will release records directly to [place].

## 2017-09-30 ENCOUNTER — Other Ambulatory Visit: Payer: Self-pay | Admitting: Family Medicine

## 2017-10-01 NOTE — Telephone Encounter (Signed)
Call pt  I am covering for her pcp I believe she has moved to [place].   She needs to est with new provider if so.   I have given her 30 tablets as she was taking the klonopin ONCE daily. No refills  Please emphasize she needs to est care as she is due for f/u appt here due to this  Medication being a controlled substance.

## 2017-10-01 NOTE — Telephone Encounter (Signed)
Okay to refill? Last written on: 03/07/17 for #90 with one refill.  LOV: 04/18/17 NOV: N/A

## 2017-10-02 NOTE — Telephone Encounter (Signed)
Left voice mail for patient to call back ok for PEC to speak to patient    

## 2017-10-03 ENCOUNTER — Ambulatory Visit
Admission: RE | Admit: 2017-10-03 | Discharge: 2017-10-03 | Disposition: A | Discharge status: Home / Self Care | Payer: Self-pay | Source: Ambulatory Visit

## 2017-10-03 ENCOUNTER — Other Ambulatory Visit: Payer: Self-pay

## 2017-10-03 DIAGNOSIS — R52 Pain, unspecified: Secondary | ICD-10-CM

## 2017-10-09 ENCOUNTER — Encounter (HOSPITAL_BASED_OUTPATIENT_CLINIC_OR_DEPARTMENT_OTHER): Payer: Self-pay | Admitting: Surgery

## 2017-10-11 ENCOUNTER — Ambulatory Visit (HOSPITAL_BASED_OUTPATIENT_CLINIC_OR_DEPARTMENT_OTHER): Payer: No Typology Code available for payment source | Admitting: Surgery

## 2017-10-28 NOTE — Progress Notes (Deleted)
ANNUAL PREVENTATIVE CARE GYN  ENCOUNTER NOTE  Subjective:       Christie Ramirez is a 62 y.o. G52P0100 female here for a routine annual gynecologic exam.  Current complaints: 1. 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 Denies vaginal bleeding, discharge, itching or dryness. Endorses occasional night sweats that are not bothersome according to pt. Sexually active , denies dyspareunia.   Reports nocturia 3-4x a night with associated urgency. States symptoms are worse at night, no urgency or frequency during the day. Denies stress incontinence. Denies instances of lost urine. States sx are not bothersome. Endorses increased water intake.   Dx with osteopenia in 2015, DEXA score of -1.4. Currently taking raloxifene 60 mg QD, as well as calcium and vitamin D supplement.   EGynecologic History No LMP recorded. Patient has had a hysterectomy. Contraception: status post hysterectomy Last Pap: 06/2015 neg/neg  Last mammogram: 2/519  birad 2. Results were: normal  Obstetric History OB History  Gravida Para Term Preterm AB Living  1 1   1       SAB TAB Ectopic Multiple Live Births               # Outcome Date GA Lbr Len/2nd Weight Sex Delivery Anes PTL Lv  1 Preterm 1995     CS-LTranv   FD    Past Medical History:  Diagnosis Date  . Anxiety   . Breast cancer of upper-inner quadrant of right female breast ([place]) 05/01/2013   Tubular carcinoma, T1b,N0,M0; ER/ PR 90%, her 2 neu not overexpressed.   . Cancer ([place]) 1995   thyroid  . Endometriosis 2009  . Hyperlipidemia   . Hypothyroidism   . Osteopenia    rt hip    Past Surgical History:  Procedure Laterality Date  . ABDOMINAL HYSTERECTOMY  2005   Martin DeFrancesco,MD  . BREAST BIOPSY Right 04-02-13   INVASIVE MAMMARY CARCINOMA WITH FEATURES OF TUBULAR CARCINOMA,  . BREAST LUMPECTOMY Right 04/2013   INVASIVE MAMMARY CARCINOMA WITH FEATURES OF TUBULAR CARCINOMA,  . BREAST SURGERY  Right 05/01/13   wide excision  . colectomy  2009   secondary to endometriosis DR. Smith  . 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 No malignancy.  . COLONOSCOPY  2009   Dr. Tamala Julian  . FOOT SURGERY  right    Current Outpatient Medications on File Prior to Visit  Medication Sig Dispense Refill  . albuterol (PROVENTIL HFA;VENTOLIN HFA) 108 (90 Base) MCG/ACT inhaler Inhale 2 puffs into the lungs every 6 (six) hours as needed for wheezing or shortness of breath. 1 Inhaler 0  . amLODipine (NORVASC) 5 MG tablet   2  . aspirin 81 MG tablet Take 81 mg by mouth daily.    Marland Kitchen atorvastatin (LIPITOR) 40 MG tablet TAKE 1 TABLET (40 MG TOTAL) BY MOUTH DAILY. 90 tablet 0  . brompheniramine-pseudoephedrine-DM 30-2-10 MG/5ML syrup Take 2.5 mLs by mouth at bedtime as needed. 120 mL 0  . CALCIUM-MAGNESIUM-ZINC PO Take by mouth 3 (three) times daily.    . Cholecalciferol (VITAMIN D-3 PO) Take 1,000 Units by mouth.    . clonazePAM (KLONOPIN) 1 MG tablet Take 1 tablet (1 mg total) by mouth daily as needed. 30 tablet 0  . escitalopram (LEXAPRO) 10 MG tablet Take 1 tablet (10 mg total) by mouth daily. 90 tablet 2  . esomeprazole (NEXIUM) 20 MG capsule Take 20 mg by mouth  daily at 12 noon.    . ezetimibe (ZETIA) 10 MG tablet Take 1 tablet (10 mg total) by mouth daily. 30 tablet 3  . GARLIC PO Take 630 mg by mouth.    . levothyroxine (SYNTHROID, LEVOTHROID) 137 MCG tablet TAKE 1 TABLET (137 MCG TOTAL) BY MOUTH DAILY BEFORE BREAKFAST. 90 tablet 0  . losartan (COZAAR) 100 MG tablet TAKE 1 TABLET (100 MG TOTAL) BY MOUTH DAILY. 90 tablet 3  . Multiple Vitamins-Minerals (MULTIVITAMIN WITH MINERALS) tablet Take 1 tablet by mouth daily.    . raloxifene (EVISTA) 60 MG tablet Take 60 mg by mouth daily.  3  . vitamin C (ASCORBIC ACID) 500 MG tablet Take 500 mg by mouth daily.     No current facility-administered medications on file prior to visit.     Allergies   Allergen Reactions  . Codeine Anaphylaxis  . Ace Inhibitors Other (See Comments)  . Iodinated Diagnostic Agents Hives  . Penicillins Rash    Social History   Socioeconomic History  . Marital status: Married    Spouse name: Not on file  . Number of children: Not on file  . Years of education: Not on file  . Highest education level: Not on file  Occupational History  . Not on file  Social Needs  . Financial resource strain: Not on file  . Food insecurity:    Worry: Not on file    Inability: Not on file  . Transportation needs:    Medical: Not on file    Non-medical: Not on file  Tobacco Use  . Smoking status: Never Smoker  . Smokeless tobacco: Never Used  Substance and Sexual Activity  . Alcohol use: Yes    Alcohol/week: 0.0 standard drinks    Comment: wine- daily  . Drug use: No  . Sexual activity: Yes    Birth control/protection: Surgical  Lifestyle  . Physical activity:    Days per week: Not on file    Minutes per session: Not on file  . Stress: Not on file  Relationships  . Social connections:    Talks on phone: Not on file    Gets together: Not on file    Attends religious service: Not on file    Active member of club or organization: Not on file    Attends meetings of clubs or organizations: Not on file    Relationship status: Not on file  . Intimate partner violence:    Fear of current or ex partner: Not on file    Emotionally abused: Not on file    Physically abused: Not on file    Forced sexual activity: Not on file  Other Topics Concern  . Not on file  Social History Narrative  . Not on file    Family History  Problem Relation Age of Onset  . Colon polyps Father        age 27  . Heart disease Father   . Heart disease Mother   . Heart disease Paternal Grandmother   . Heart disease Paternal Grandfather   . Cancer Neg Hx   . Diabetes Neg Hx   . Breast cancer Neg Hx     The following portions of the patient's history were reviewed and  updated as appropriate: allergies, current medications, past family history, past medical history, past social history, past surgical history and problem list.  Review of Systems   Objective:   There were no vitals taken for this visit. CONSTITUTIONAL: Well-developed, well-nourished  female in no acute distress.  PSYCHIATRIC: Normal mood and affect. Normal behavior. Normal judgment and thought content. [place]: Alert and oriented to person, place, and time. Normal muscle tone coordination. No cranial nerve deficit noted. HENT:  Normocephalic, atraumatic, External right and left ear normal. Oropharynx is clear and moist EYES: Conjunctivae and EOM are normal. Pupils are equal, round, and reactive to light. No scleral icterus.  NECK: Normal range of motion, supple, no masses. Thyroid surgically absent.  SKIN: Skin is warm and dry. No rash noted. Not diaphoretic. No erythema. No pallor. CARDIOVASCULAR: Normal heart rate noted, regular rhythm, no murmur. RESPIRATORY: Clear to auscultation bilaterally. Effort and breath sounds normal, no problems with respiration noted. BREASTS: Symmetric in size. No masses, skin changes, nipple drainage, or lymphadenopathy. 2.3 cm nodule noted just inferior to the scar in her right breast, stable; bilateral breast bruising ABDOMEN: Soft, normal bowel sounds, no distention noted.  No tenderness, rebound or guarding.  BLADDER: Normal PELVIC:  External Genitalia: Normal  BUS: Normal  Vagina: Mild-moderate atrophy; vaginal cuff intact  Cervix: Surgically absent  Uterus: Surgically absent    Bimanual: No palpable masses or tenderness  Adnexa: Normal  RV: External Exam NormaI, No Rectal Masses and Normal Sphincter tone  MUSCULOSKELETAL: Normal range of motion. No tenderness.  No cyanosis, clubbing, or edema.  2+ DP/PT pulses. LYMPHATIC: No Axillary, Supraclavicular, cervical or Inguinal Adenopathy.    Assessment:   Annual gynecologic examination 61  y.o. Contraception: status post hysterectomy bmi-30 Osteopenia on raloxifene  Vaginal atrophy, postmenopausal  History of DCIS, right breast, NED  Plan:  Pap:not needed Mammogram:utd Stool Guaiac Testing:  Epi pro colon Labs:lipid fbs tsh a1c  Routine preventative health maintenance measures emphasized: Exercise/Diet/Weight control, Tobacco Warnings, Alcohol/Substance use risks, Stress Management and Safe Sex Continue raloxifene, calcium and vitamin D supplement Return to [place], [place]      Note: This dictation was prepared with Dragon dictation along with smaller phrase technology. Any transcriptional errors that result from this process are unintentional.

## 2017-10-30 ENCOUNTER — Encounter: Payer: BLUE CROSS/BLUE SHIELD | Admitting: Obstetrics and Gynecology

## 2017-10-31 ENCOUNTER — Ambulatory Visit (HOSPITAL_BASED_OUTPATIENT_CLINIC_OR_DEPARTMENT_OTHER): Payer: No Typology Code available for payment source | Admitting: Surgery

## 2017-11-12 ENCOUNTER — Encounter (HOSPITAL_BASED_OUTPATIENT_CLINIC_OR_DEPARTMENT_OTHER): Payer: Self-pay | Admitting: Surgery

## 2017-11-12 ENCOUNTER — Encounter (HOSPITAL_BASED_OUTPATIENT_CLINIC_OR_DEPARTMENT_OTHER): Payer: Self-pay

## 2017-11-12 ENCOUNTER — Ambulatory Visit (INDEPENDENT_AMBULATORY_CARE_PROVIDER_SITE_OTHER): Payer: No Typology Code available for payment source | Admitting: Surgery

## 2017-11-12 ENCOUNTER — Other Ambulatory Visit (HOSPITAL_BASED_OUTPATIENT_CLINIC_OR_DEPARTMENT_OTHER): Payer: Self-pay | Admitting: Surgery

## 2017-11-12 VITALS — BP 123/84 | HR 89 | Temp 97.4°F | Ht 61.0 in | Wt 161.4 lb

## 2017-11-12 DIAGNOSIS — Z853 Personal history of malignant neoplasm of breast: Secondary | ICD-10-CM | POA: Insufficient documentation

## 2017-11-12 DIAGNOSIS — Z85850 Personal history of malignant neoplasm of thyroid: Secondary | ICD-10-CM

## 2017-11-12 NOTE — Progress Notes (Signed)
Scheduled patient for mammogram at [facility] on [facility] on 11/15/17 at 7:15 AM, arriving at 7:00 AM; and breast MRI at [facility] on 25/4/27 at 0:62 AM, arriving 9:15 AM.  Orders were faxed.  Patient aware to take previous imaging to mammogram appointment. Patient states she has claustrophobia; this was communicated to the Nurse for pre-MRI medication.  Patient instructed to take CDs of previous breast imaging to the mammogram appointment.  Scheduled patient with Dr. Raymon Mutton for consult on 11/20/17.  Provided patient with order for bra and breast prosthesis.  Emailed Medical Oncology new patient scheduler to call patient for an appointment with Dr. Dorian Pod or Dr. Ladell Heads; patient aware.  Provided patient with copy of [facility] monthly brochure for October and November.  Emailed genetics to contact patient for an appointment; patient aware.  Scheduled 6 month follow-up and office procedure with Dr. Cyndi Bender on 05/22/18 at 9:00 AM, arriving at 8:45 AM.  Patient expressed undertanding and agreement along with her appreciation.

## 2017-11-18 ENCOUNTER — Other Ambulatory Visit: Payer: Self-pay | Admitting: Cardiovascular Disease

## 2017-11-20 ENCOUNTER — Encounter (HOSPITAL_BASED_OUTPATIENT_CLINIC_OR_DEPARTMENT_OTHER): Payer: Self-pay | Admitting: Surgery

## 2017-11-22 ENCOUNTER — Telehealth: Payer: Self-pay | Admitting: Cardiovascular Disease

## 2017-11-22 NOTE — Telephone Encounter (Signed)
Unable to send message for patient She is needing an appointment  Will try again at a later time

## 2017-11-22 NOTE — Telephone Encounter (Signed)
-----   Message from Alba Destine, [place] sent at 11/19/2017 10:01 AM EDT ----- Please contact patient to schedule an appointment.

## 2017-11-23 NOTE — Progress Notes (Signed)
Treatment Team  Ref Prov: Pasty Arch, NP  Primary Care Physician:   Pasty Arch, NP Radiology facility: [facility] Breast Surgeon : Henderson Baltimore MD  [phone]    Donnalee Curry, MD   Plastic Surgeon  Radiation Onc:  Unknown Medical Onc: In [facility]        Breast Cancer Comprehensive Care Plan Summary  Date of diagnosis: 04/2012 Event : Primary Breast Cancer ECOG Performance: Grade 0  (Fully active) Genetic Testing :not indicated   Presentation : Unknown Side: right Focality :  unifocal Location: UOQ   Biologic Tumor characteristics  Tumor: Invasive Ductal Carcinoma Grade: well differentiated Ki-67:  not done Oncotype Dx:  not indicated    Estrogen Receptor: positive Progesterone Receptor: positive Her-2-neu Receptor: 0 , negative Androgen Receptor;  not done   Staging  Tumor Size:   0.5 cm Nodes: 0 sentinel nodes out of 3 Systemic Metastases: clinically negative Stage:  Stage IA, T1 N0 M0   Treatment  Modality Date    Surgery   04/2012 right, partial mastectomy and sentinel node biopsy   Radiation  Mammosite partial breast irradiation   Endocrine  Started on Letrozole in 04/2012   Chemotherapy     Biologic     Clinical trial      Chief complaint : Follow-up personal history of breast cancer    History of Present Illness    Ms Marissa Russell is a 62 y.o. female patient referred for follow up of her history of breast cancer originally diagnosed in 04/2012.  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  She is status post lumpectomy and sentinel node biopsy.  She was treated with MammoSite partial breast irradiation.  She was started on letrozole.  She reportedly only took it for several months due to hot flashes.  The patient then moved here to [place] from [place].    Currently, the patient has no complaints.  She denies feeling any palpable masses, areas of  thickening, skin changes or nipple discharge.    She had a bilateral diagnostic mammogram  on 02/2017 at [facility]. It demonstrated  heterogeneously dense breasts and stable post surgical changes in  Both breasts.   The following portions of the patient's history were reviewed and updated as appropriate: allergies, current medications, past family history, past medical history, past social history, past surgical history and problem list.     has a past medical history of Depression, History of thyroid cancer (1996), Hypertension, and Malignant neoplasm.      Gynecological History   Age of menarche:: 67   Breast pain:: No     Age of menopause:: hysterctomy       G:: 1     P:: 0   Hormone Replacement therapy:: No      History of breastfeeding:: No     Bra size:: 36C      Last mammogram:: 02/27/17   Marital status:: Married     Nipple discharge:: No   Ethnicity:: Caucasian       Review of Systems  Pertinent items are noted in HPI.    All other systems were reviewed and are negative.      Physical Exam   BP 123/84 (BP Site: Left arm)   Pulse 89   Temp 97.4 F (36.3 C) (Oral)   Ht 1.549 m (5\' 1" )  Wt 73.2 kg (161 lb 6.4 oz)   BMI 30.50 kg/m     WDWN female in NAD  HEENT: Eyes- Clear, no scleral icterus,      Ears/Nose- WNL Hearing-WNL      Mouth-WNL  Neck:  No thyromegaly or masses, trachea midline  Chest: Respiratory effort normal   Palpation of the chest wall: No tenderness, asymmetry, or crepitus    BJM:  Gait and station normal  Ext:  No CCE,   Upper Extremity Range of Motion: WNL   Skin:  Free of significant ulcers or lesions  Neuro:  Grossly intact, no focal findings, alert and oriented, asks appropriate questions  LN:  No axillary, supraclavicular or cervical adenopathy  Breast:    Symmetry:Asymmetric   Ptosis grade  1  Bra size 36C   Right breast:     Presence:Present    Skin:Skin color, texture, turgor normal. No rashes or lesions.     Nipple:normal    Tissue: Within normal  limits-status post lumpectomy.  The incision is well-healed she does have some scar tissue related to her previous MammoSite radiation.     Left breast:     Presence:Present    Skin:Skin color, texture, turgor normal. No rashes or lesions.     Nipple:normal    Tissue: Within normal limits    Assessment/Plan  Personal history of breast carcinoma    Patient is doing well. She currently has no evidence of disease .  Her physical and emotional recovery is good. Cosmetic outcome is good .   I have recommended that she follow-up with medical oncology.  I will have her see Dr. Dorian Pod.  I would also recommend that she get a bone scan consider an aromatase inhibitor.  At this point we will go ahead and set her up for a follow-up mammogram in February 2020.  I have answered all of her questions.  She is comfortable with this approach  Surveillance / Folllow up recommendations:    I reviewed the importance of breast exams and follow up at six month intervals for the first five years after the first treatment; alternating exams at 6 month intervals with medical oncology is a good option between years three and five to limit the number of times needed for follow up ; and every year thereafter.  I have recommended long term follow-up because there is a low but measurable risk of breast cancer recurrence even more than 10 years after treatment. In addition, if  there is remaining breast tissue, there is a higher risk to develop a new, independent, breast cancer at some time in the future.    After lumpectomy, we recommend mammograms once a year. The purpose is to monitor for local recurrence and detection of new primary.  Diagnostic bilateral mammography in 4 months.  Due to the firmness in the lateral aspect of the right breast, I would also recommend we go and set her up for a breast MRI.  The patient is also interested in consulting with genetics regarding the risks and benefits of genetic testing given her history of both  breast cancer and thyroid cancer.    Patient is advised to report these symptoms : new lumps, bone pain, chest pain, shortness of breath or difficulty breathing, abdominal pain, or persistent headaches.     The following tests are not recommended for routine breast cancer follow-up: breast MRI, FDG-PET scans, complete blood cell counts, automated chemistry studies, chest x-rays, bone scans, liver ultrasound, and tumor markers (  CA 15-3, CA 27.29, CEA).     Nutrition  I have also discussed life-style choices to decrease risk of breast cancer such as:   Healthy eating habits, meals high in fruits, vegetables, nuts and complex carbohydrates, low in animal fats.  Limit the amount of alcohol ingested   Avoid  Hormone Replacement Therapy.    Exercise  Continue to exercise, aerobic 30 - 40 minutes , 3 -4 times / week    Weight Management-  Screened- Stable, no current issue    Upper Extremity Range of Motion/Lymphedema  Screened- Stable, no current issue      Follow up  I have provided the patient with the following educational materials:  Life with Cancer Calendar  Follow up visit with me  in 6 months  Follow up with Medical Oncology.    Greater than 40 minutes was spent on this encounter with > 50% spent face to face with the patient.

## 2017-11-24 ENCOUNTER — Ambulatory Visit: Payer: No Typology Code available for payment source

## 2017-11-26 ENCOUNTER — Other Ambulatory Visit: Payer: Self-pay | Admitting: Cardiovascular Disease

## 2017-11-26 NOTE — Telephone Encounter (Signed)
Unable to leave message for patient  Will try again at a later time

## 2017-11-28 ENCOUNTER — Other Ambulatory Visit: Payer: Self-pay | Admitting: Cardiovascular Disease

## 2017-11-28 NOTE — Telephone Encounter (Signed)
Unable to leave message for patient  Will try again at a later time

## 2017-11-29 ENCOUNTER — Encounter: Payer: Self-pay | Admitting: Cardiovascular Disease

## 2017-11-29 NOTE — Telephone Encounter (Signed)
Unable to contact °Sent letter  °

## 2017-11-30 DIAGNOSIS — I10 Essential (primary) hypertension: Secondary | ICD-10-CM | POA: Insufficient documentation

## 2017-11-30 DIAGNOSIS — R7303 Prediabetes: Secondary | ICD-10-CM | POA: Insufficient documentation

## 2017-12-02 ENCOUNTER — Other Ambulatory Visit: Payer: Self-pay | Admitting: Family Medicine

## 2017-12-08 ENCOUNTER — Other Ambulatory Visit: Payer: Self-pay | Admitting: Family Medicine

## 2017-12-11 NOTE — Telephone Encounter (Signed)
I will send in one refill.  The patient has moved to [place] and she should be establishing care with a provider there.  She needs to get future refills from her new primary care provider in [place].

## 2017-12-16 ENCOUNTER — Other Ambulatory Visit: Payer: Self-pay | Admitting: Internal Medicine

## 2017-12-24 ENCOUNTER — Encounter (HOSPITAL_BASED_OUTPATIENT_CLINIC_OR_DEPARTMENT_OTHER): Payer: Self-pay

## 2017-12-24 NOTE — Progress Notes (Signed)
OP Behavioral Health Prescreen (Psychiatry and Therapy)  Date/Time: 12/24/2017, 3:20 PM  Interviewer: Carolann Littler  Patients Name (Always update Registration for Patient):Marissa Russell    Patient's Telephone Number (s): [phone] (home) ,     Does the patient require Hard of Hearing Services? no  Does the patient require Language Services? no    Who is referring you for Services: googled  (Ex: Doctor's name, Clinic name, online research, Insurance etc.)    Can you make appointments during normal business hours? yes      SCREENING:    Reason for Referral: Patient reported "lots of anxiety going on at work by bullying." "I am having troubles sleeping". Patient stated "heart beating and palms sweating, not eating and I dropped 6 pounds in weight"    Therapy services or Psychiatry services -Please elaborate on reason for referral. Example: depression, anxiety, anger management, ADHD, etc.  If ADHD, advice Pt to bring any testing results or supporting documentation to appointment. The provider may require this in order to prescribe stimulants.    Are you presently receiving behavioral health services from an outpatient provider?  no  IF YES:   Name of Provider: NA  Discipline (Psychiatrist, Psychologist, PCP, and therapist: NA  Are you presently prescribed psychiatric medications? no  Most recent Appointment (Past or Future)? NA    Have you had thoughts about harming/suicide within the last month? no  If YES what were the circumstances? NA  When was the last time within the 30 days that you felt this way? NA  Note: REFER OUT IF APPROPRIATE    Do you have any memory/cognition impairments? no  Note: Therapy - REFER OUT IF APPROPRIATE    Are you currently using alcohol or drugs? yes  If YES please complete for all reported alcohol or drugs  Drug: etoh, occasional wine with dinner  Pattern (Route, Amount) of use:   Date of last Use:   Note: REFER OUT IF APPROPRIATE    Do you have any legal issues? Current or  pending charges? no  If YES, do you have pending court date?   Are you seeking behavioral health services due to present legal issue? no  Note: REFER OUT IF APPROPRIATE        Additional Comment:Patient did not take offered appointment in February. Patient was given information to [facility].  Pt reported Clinic location of Preference to be: [facility]      Recommended Level of Care:Psychiatry  Waitlist for Therapy: no        Reminders:  . We ask that you give [place] 24 hour notice if you need to cancel or reschedule your appointment  . Late cancellation or no shows may be charged a $45 no show charge.  . Please bring the most up to date list/with dosages of your medication to your appointment    Psychiatry  . If a Pt is requesting non-clinical documentation (i.e. Disability evaluation), please refer to the following information:  . Our mental health services are therapeutic in nature and assessment is limited to clinical evaluation and treatment.  Assessments that do not fall under [facility]'s scope of services include:  Court-ordered compliance   Evidence of disability   Court mandated services resulting in the provider testifying on your behalf  Expert witnesses in court cases  Impressions for or fitness for custody   Evaluations for emotional support animals

## 2017-12-28 ENCOUNTER — Ambulatory Visit (HOSPITAL_BASED_OUTPATIENT_CLINIC_OR_DEPARTMENT_OTHER): Payer: No Typology Code available for payment source | Admitting: Internal Medicine

## 2018-01-31 ENCOUNTER — Encounter: Payer: BLUE CROSS/BLUE SHIELD | Admitting: Family Medicine

## 2018-03-08 DIAGNOSIS — S76319A Strain of muscle, fascia and tendon of the posterior muscle group at thigh level, unspecified thigh, initial encounter: Secondary | ICD-10-CM | POA: Insufficient documentation

## 2018-03-10 ENCOUNTER — Other Ambulatory Visit: Payer: Self-pay | Admitting: Internal Medicine

## 2018-03-21 DIAGNOSIS — D1720 Benign lipomatous neoplasm of skin and subcutaneous tissue of unspecified limb: Secondary | ICD-10-CM | POA: Insufficient documentation

## 2018-04-13 ENCOUNTER — Other Ambulatory Visit: Payer: Self-pay | Admitting: Cardiovascular Disease

## 2018-04-28 ENCOUNTER — Other Ambulatory Visit: Payer: Self-pay | Admitting: Cardiovascular Disease

## 2018-05-22 ENCOUNTER — Telehealth (HOSPITAL_BASED_OUTPATIENT_CLINIC_OR_DEPARTMENT_OTHER): Payer: No Typology Code available for payment source | Admitting: Family Nurse Practitioner

## 2018-05-25 ENCOUNTER — Other Ambulatory Visit: Payer: Self-pay | Admitting: Cardiovascular Disease

## 2018-06-11 DIAGNOSIS — F411 Generalized anxiety disorder: Secondary | ICD-10-CM | POA: Insufficient documentation

## 2018-06-14 ENCOUNTER — Other Ambulatory Visit: Payer: Self-pay | Admitting: Cardiovascular Disease

## 2018-06-18 ENCOUNTER — Other Ambulatory Visit: Payer: Self-pay | Admitting: Internal Medicine

## 2018-06-18 NOTE — Telephone Encounter (Signed)
The patient was last seen by Dr. Rockey Situ on 03/05/17. She was to follow up in 3-4 weeks. No follow up was had- several attempts were documented in her chart by staff to reach the patient after her initial visit with no success.  Can you try to call her and see where she is getting her medical care at this time? It looks like she hasn't seen Dr. Caryl Bis either since 04/18/17.   The patient's  pharmacy request is for a pharmacy in [place], which is looks like her current mailing address is for [place], [place].   If you have no success in reaching the patient, or she is getting her care in [place] now, please do not refill her zetia.

## 2018-06-18 NOTE — Telephone Encounter (Signed)
Please review for refill.  

## 2018-06-18 NOTE — Telephone Encounter (Signed)
Unable to contact patient number not working will send a message to Pharmacy to contact office.

## 2018-07-07 ENCOUNTER — Other Ambulatory Visit: Payer: Self-pay | Admitting: Cardiovascular Disease

## 2018-07-20 DIAGNOSIS — J019 Acute sinusitis, unspecified: Secondary | ICD-10-CM | POA: Insufficient documentation

## 2018-08-09 ENCOUNTER — Telehealth: Payer: Self-pay | Admitting: *Deleted

## 2018-08-09 NOTE — Telephone Encounter (Signed)
Copied from [place] [id number]. Topic: General - Other >> Aug 09, 2018  4:20 PM Wynetta Emery, [place] C wrote: Reason for CRM: pt called in to schedule a follow up. Pt says that she will not be in town on Monday or Tuesday but would like to have visit sometime next week if possible.

## 2018-08-16 ENCOUNTER — Ambulatory Visit: Payer: 59 | Admitting: Family Medicine

## 2018-08-16 ENCOUNTER — Other Ambulatory Visit: Payer: Self-pay

## 2018-08-16 NOTE — Progress Notes (Signed)
Appt cancelled by patient. Not seen by provider.

## 2018-08-20 ENCOUNTER — Ambulatory Visit
Admission: RE | Admit: 2018-08-20 | Discharge: 2018-08-20 | Disposition: A | Discharge status: Home / Self Care | Payer: 59 | Source: Ambulatory Visit | Attending: Family Medicine | Admitting: Family Medicine

## 2018-08-20 ENCOUNTER — Ambulatory Visit (INDEPENDENT_AMBULATORY_CARE_PROVIDER_SITE_OTHER): Payer: 59 | Admitting: Family Medicine

## 2018-08-20 ENCOUNTER — Encounter: Payer: Self-pay | Admitting: Family Medicine

## 2018-08-20 ENCOUNTER — Other Ambulatory Visit: Payer: Self-pay

## 2018-08-20 VITALS — Ht 61.0 in | Wt 161.0 lb

## 2018-08-20 DIAGNOSIS — K219 Gastro-esophageal reflux disease without esophagitis: Secondary | ICD-10-CM

## 2018-08-20 DIAGNOSIS — M25512 Pain in left shoulder: Secondary | ICD-10-CM | POA: Diagnosis not present

## 2018-08-20 DIAGNOSIS — M79662 Pain in left lower leg: Secondary | ICD-10-CM

## 2018-08-20 DIAGNOSIS — G8929 Other chronic pain: Secondary | ICD-10-CM

## 2018-08-20 DIAGNOSIS — M25551 Pain in right hip: Secondary | ICD-10-CM | POA: Diagnosis not present

## 2018-08-20 DIAGNOSIS — Z1211 Encounter for screening for malignant neoplasm of colon: Secondary | ICD-10-CM

## 2018-08-20 MED ORDER — OMEPRAZOLE 20 MG PO CPDR: 20.0000 mg | DELAYED_RELEASE_CAPSULE | Freq: Every day | ORAL | 0 refills | Status: DC

## 2018-08-20 NOTE — Progress Notes (Signed)
Virtual Visit via telephone Note  This visit type was conducted due to national recommendations for restrictions regarding the COVID-19 pandemic (e.g. social distancing).  This format is felt to be most appropriate for this patient at this time.  All issues noted in this document were discussed and addressed.  No physical exam was performed (except for noted visual exam findings with Video Visits).   I connected with Christie Ramirez today at  3:30 PM EDT by telephone and verified that I am speaking with the correct person using two identifiers. Location patient: home Location provider: work  Persons participating in the virtual visit: patient, provider  I discussed the limitations, risks, security and privacy concerns of performing an evaluation and management service by telephone and the availability of in person appointments. I also discussed with the patient that there may be a patient responsible charge related to this service. The patient expressed understanding and agreed to proceed.  Interactive audio and video telecommunications were attempted between this provider and patient, however failed, due to patient having technical difficulties OR patient did not have access to video capability.  We continued and completed visit with audio only.  Reason for visit: Follow-up.  HPI: Left shoulder pain: Patient notes this has been going on for quite some time now.  She was evaluated in [place] and found to have calcification in her rotator cuff.  She has difficulty abducting her left shoulder.  She notes her pain is not quite as bad as it was previously.  She reports she was advised to see an orthopedist though the COVID-19 pandemic prevented that.  Right hip pain: Patient reports this has been going on for quite some time as well.  She had an MRI that revealed a torn hamstring and she reports a fatty tumor as well.  Walking causes it to hurt.  She was advised by her PCP in [place] to see an  orthopedist as well for this.  Left calf pain: started about a week ago.  It has occurred twice.  Feels like a charley horse and feels tight in her posterior calf.  No swelling.  No injury.  No history of blood clots.  No recent surgeries or travel.  No hospitalizations.  GERD: She has been taking Pepcid 1-2 times daily.  She has a gagging sensation in her throat with this.  Belches frequently.  Feels nauseous and has burning in her throat as well.  No dysphagia.  No blood in her stool.   ROS: See pertinent positives and negatives per HPI.  Past Medical History:  Diagnosis Date  . Anxiety   . 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   . Cancer ([place]) 1995   thyroid  . Endometriosis 2009  . Fibula fracture 11/11/2015  . Hyperlipidemia   . Hypothyroidism   . Osteopenia    rt hip    Past Surgical History:  Procedure Laterality Date  . ABDOMINAL HYSTERECTOMY  2005   Martin DeFrancesco,MD  . BREAST BIOPSY Right 04-02-13   INVASIVE MAMMARY CARCINOMA WITH FEATURES OF TUBULAR CARCINOMA,  . BREAST LUMPECTOMY Right 04/2013   INVASIVE MAMMARY CARCINOMA WITH FEATURES OF TUBULAR CARCINOMA,  . BREAST SURGERY Right 05/01/13   wide excision  . colectomy  2009   secondary to endometriosis DR. Smith  . 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 No malignancy.  Marland Kitchen  COLONOSCOPY  2009   Dr. Tamala Julian  . FOOT SURGERY  right    Family History  Problem Relation Age of Onset  . Colon polyps Father        age 54  . Heart disease Father   . Heart disease Mother   . Heart disease Paternal Grandmother   . Heart disease Paternal Grandfather   . Cancer Neg Hx   . Diabetes Neg Hx   . Breast cancer Neg Hx     SOCIAL HX: Non-smoker   Current Outpatient Medications:  .  albuterol (PROVENTIL HFA;VENTOLIN HFA) 108 (90 Base) MCG/ACT inhaler,  Inhale 2 puffs into the lungs every 6 (six) hours as needed for wheezing or shortness of breath., Disp: 1 Inhaler, Rfl: 0 .  amLODipine (NORVASC) 5 MG tablet, , Disp: , Rfl: 2 .  atorvastatin (LIPITOR) 40 MG tablet, TAKE 1 TABLET (40 MG TOTAL) BY MOUTH DAILY., Disp: 90 tablet, Rfl: 0 .  brompheniramine-pseudoephedrine-DM 30-2-10 MG/5ML syrup, Take 2.5 mLs by mouth at bedtime as needed., Disp: 120 mL, Rfl: 0 .  escitalopram (LEXAPRO) 10 MG tablet, TAKE 1 TABLET BY MOUTH EVERY DAY, Disp: 90 tablet, Rfl: 0 .  ezetimibe (ZETIA) 10 MG tablet, TAKE 1 TABLET BY MOUTH EVERY DAY, Disp: 15 tablet, Rfl: 0 .  GARLIC PO, Take 588 mg by mouth., Disp: , Rfl:  .  levothyroxine (SYNTHROID, LEVOTHROID) 137 MCG tablet, TAKE 1 TABLET (137 MCG TOTAL) BY MOUTH DAILY BEFORE BREAKFAST., Disp: 90 tablet, Rfl: 0 .  losartan (COZAAR) 100 MG tablet, TAKE 1 TABLET BY MOUTH EVERY DAY, Disp: 90 tablet, Rfl: 0 .  Multiple Vitamins-Minerals (MULTIVITAMIN WITH MINERALS) tablet, Take 1 tablet by mouth daily., Disp: , Rfl:  .  aspirin 81 MG tablet, Take 81 mg by mouth daily., Disp: , Rfl:  .  CALCIUM-MAGNESIUM-ZINC PO, Take by mouth 3 (three) times daily., Disp: , Rfl:  .  Cholecalciferol (VITAMIN D-3 PO), Take 1,000 Units by mouth., Disp: , Rfl:  .  clonazePAM (KLONOPIN) 1 MG tablet, Take 1 tablet (1 mg total) by mouth daily as needed. (Patient not taking: Reported on 08/20/2018), Disp: 30 tablet, Rfl: 0 .  omeprazole (PRILOSEC) 20 MG capsule, Take 1 capsule (20 mg total) by mouth daily., Disp: 30 capsule, Rfl: 0 .  raloxifene (EVISTA) 60 MG tablet, Take 60 mg by mouth daily., Disp: , Rfl: 3 .  vitamin C (ASCORBIC ACID) 500 MG tablet, Take 500 mg by mouth daily., Disp: , Rfl:   EXAM: This was a telehealth telephone visit and thus no physical exam was completed.  ASSESSMENT AND PLAN:  Discussed the following assessment and plan:  Left shoulder pain Sounds as though this is rotator cuff issue.  Will refer to orthopedics.   Right hip pain Refer to orthopedics for further evaluation.  GERD (gastroesophageal reflux disease) Trial of omeprazole.  If not improving with this she will let us know.  Pain of left calf Likely cramping though we will obtain an ultrasound to rule out DVT.  Colon cancer screening Refer for colonoscopy.   Social distancing precautions and sick precautions given regarding COVID-19.   I discussed the assessment and treatment plan with the patient. The patient was provided an opportunity to ask questions and all were answered. The patient agreed with the plan and demonstrated an understanding of the instructions.   The patient was advised to call back or seek an in-person evaluation if the symptoms worsen or if the condition fails to improve as anticipated.  I provided 18 minutes of non-face-to-face time during this encounter.   [name], MD

## 2018-08-22 ENCOUNTER — Telehealth: Payer: Self-pay | Admitting: Family Medicine

## 2018-08-22 DIAGNOSIS — M25551 Pain in right hip: Secondary | ICD-10-CM

## 2018-08-22 DIAGNOSIS — M25512 Pain in left shoulder: Secondary | ICD-10-CM

## 2018-08-22 DIAGNOSIS — G8929 Other chronic pain: Secondary | ICD-10-CM

## 2018-08-22 NOTE — Telephone Encounter (Signed)
Advise

## 2018-08-22 NOTE — Telephone Encounter (Signed)
Copied from [place] [id number]. Topic: General - Other >> Aug 22, 2018  9:37 AM Keene Breath wrote: Reason for CRM: Patient called to check the status of a referral that she said the doctor would request for ortho for her hip and shoulder.  Please advise and call patient when the referral has been approved.  CB# [phone]

## 2018-08-22 NOTE — Telephone Encounter (Signed)
I have now placed this.

## 2018-08-25 ENCOUNTER — Encounter: Payer: Self-pay | Admitting: Family Medicine

## 2018-08-25 DIAGNOSIS — M79662 Pain in left lower leg: Secondary | ICD-10-CM | POA: Insufficient documentation

## 2018-08-25 DIAGNOSIS — M25512 Pain in left shoulder: Secondary | ICD-10-CM | POA: Insufficient documentation

## 2018-08-25 NOTE — Assessment & Plan Note (Signed)
Trial of omeprazole.  If not improving with this she will let us know.

## 2018-08-25 NOTE — Assessment & Plan Note (Signed)
Sounds as though this is rotator cuff issue.  Will refer to orthopedics.

## 2018-08-25 NOTE — Assessment & Plan Note (Signed)
Refer for colonoscopy 

## 2018-08-25 NOTE — Assessment & Plan Note (Signed)
Refer to orthopedics for further evaluation. 

## 2018-08-25 NOTE — Assessment & Plan Note (Signed)
Likely cramping though we will obtain an ultrasound to rule out DVT.

## 2018-09-02 ENCOUNTER — Telehealth: Payer: Self-pay | Admitting: Family Medicine

## 2018-09-02 NOTE — Telephone Encounter (Signed)
Pt is requesting to have her trazodone 100mg   Refilled, this medication was prescribed to her when she lived in [place] by another provider.

## 2018-09-03 MED ORDER — TRAZODONE HCL 50 MG PO TABS: 100.0000 mg | ORAL_TABLET | Freq: Every evening | ORAL | 3 refills | Status: DC | PRN

## 2018-09-03 NOTE — Telephone Encounter (Signed)
I am happy to refill this though please find out what she takes it for. What dose is she taking and how frequently? Thanks.

## 2018-09-03 NOTE — Telephone Encounter (Signed)
I called and spoke with patient and she stated she takes 100 mg, which is in a 50 mg tablet and she takes ( 2) 50 mg tablets at night for sleep, so 100 mg total.  Georganne Siple,cma

## 2018-09-03 NOTE — Telephone Encounter (Signed)
Sent to pharmacy 

## 2018-09-03 NOTE — Telephone Encounter (Signed)
Patient is requesting a refill on trazodone and you are not the prescribing MD please advise.  Christie Ramirez,cma

## 2018-09-04 ENCOUNTER — Emergency Department
Admission: EM | Admit: 2018-09-04 | Discharge: 2018-09-04 | Disposition: A | Discharge status: Home / Self Care | Payer: 59 | Attending: Student in an Organized Health Care Education/Training Program | Admitting: Student in an Organized Health Care Education/Training Program

## 2018-09-04 ENCOUNTER — Other Ambulatory Visit: Payer: Self-pay

## 2018-09-04 ENCOUNTER — Encounter: Payer: Self-pay | Admitting: Emergency Medicine

## 2018-09-04 DIAGNOSIS — W269XXA Contact with unspecified sharp object(s), initial encounter: Secondary | ICD-10-CM | POA: Insufficient documentation

## 2018-09-04 DIAGNOSIS — E039 Hypothyroidism, unspecified: Secondary | ICD-10-CM | POA: Diagnosis not present

## 2018-09-04 DIAGNOSIS — I10 Essential (primary) hypertension: Secondary | ICD-10-CM | POA: Diagnosis not present

## 2018-09-04 DIAGNOSIS — S61211A Laceration without foreign body of left index finger without damage to nail, initial encounter: Secondary | ICD-10-CM | POA: Insufficient documentation

## 2018-09-04 DIAGNOSIS — Z79899 Other long term (current) drug therapy: Secondary | ICD-10-CM | POA: Insufficient documentation

## 2018-09-04 DIAGNOSIS — Y9389 Activity, other specified: Secondary | ICD-10-CM | POA: Diagnosis not present

## 2018-09-04 DIAGNOSIS — Z7982 Long term (current) use of aspirin: Secondary | ICD-10-CM | POA: Diagnosis not present

## 2018-09-04 DIAGNOSIS — Y929 Unspecified place or not applicable: Secondary | ICD-10-CM | POA: Diagnosis not present

## 2018-09-04 DIAGNOSIS — Z853 Personal history of malignant neoplasm of breast: Secondary | ICD-10-CM | POA: Insufficient documentation

## 2018-09-04 DIAGNOSIS — Y999 Unspecified external cause status: Secondary | ICD-10-CM | POA: Diagnosis not present

## 2018-09-04 NOTE — ED Provider Notes (Signed)
[facility] Emergency Department Provider Note   ____________________________________________   First MD Initiated Contact with Patient 09/04/18 1310     (approximate)  I have reviewed the triage vital signs and the nursing notes.   HISTORY  Chief Complaint Laceration    HPI Christie Ramirez is a 63 y.o. female patient presents for laceration to the dorsal aspect of the left index finger.  Incident occurred prior to arrival.  Patient was cut by a ceramic dish.  Bleeding is controlled by direct pressure.  Patient denies loss sensation or loss of function.  Patient is right-hand dominant.  Patient rates pain as a 4/10.  Patient scribed pain is "achy".  No other palliative measures for complaint.         Past Medical History:  Diagnosis Date  . Anxiety   . 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   . Cancer ([place]) 1995   thyroid  . Endometriosis 2009  . Fibula fracture 11/11/2015  . Hyperlipidemia   . Hypothyroidism   . Osteopenia    rt hip    Patient Active Problem List   Diagnosis Date Noted  . Left shoulder pain 08/25/2018  . Pain of left calf 08/25/2018  . Carotid artery plaque 03/05/2017  . Palpitations 02/03/2017  . Rectal bleeding 02/03/2017  . Focal night sweats 02/03/2017  . Carpal tunnel syndrome 02/03/2017  . GERD (gastroesophageal reflux disease) 02/03/2017  . Neck pain 10/30/2016  . Acute pain of left knee 07/27/2016  . Acute pain of right shoulder 07/06/2016  . Fatigue 10/01/2015  . Right hip pain 08/11/2015  . Osteopenia 08/11/2015  . Chronic bronchitis ([place]) 01/29/2015  . History of herpes genitalis 09/23/2014  . Endometriosis 09/23/2014  . OSA (obstructive sleep apnea) 09/19/2013  . Colon cancer screening 07/28/2013  . Insomnia 06/09/2013  . Breast cancer of upper-inner quadrant of right female breast ([place])  04/08/2013  . Hyperlipidemia with target low density lipoprotein (LDL) cholesterol less than 100 mg/dL 06/26/2011  . Hypertension 05/18/2011  . Anxiety 05/18/2011  . Hypothyroidism 10/12/2010  . Depression 10/12/2010    Past Surgical History:  Procedure Laterality Date  . ABDOMINAL HYSTERECTOMY  2005   Martin DeFrancesco,MD  . BREAST BIOPSY Right 04-02-13   INVASIVE MAMMARY CARCINOMA WITH FEATURES OF TUBULAR CARCINOMA,  . BREAST LUMPECTOMY Right 04/2013   INVASIVE MAMMARY CARCINOMA WITH FEATURES OF TUBULAR CARCINOMA,  . BREAST SURGERY Right 05/01/13   wide excision  . colectomy  2009   secondary to endometriosis DR.   . 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 No malignancy.  . COLONOSCOPY  2009   Dr. Tamala Julian  . FOOT SURGERY  right    Prior to Admission medications   Medication Sig Start Date End Date Taking? Authorizing Provider  albuterol (PROVENTIL HFA;VENTOLIN HFA) 108 (90 Base) MCG/ACT inhaler Inhale 2 puffs into the lungs every 6 (six) hours as needed for wheezing or shortness of breath. 10/13/16   Pleas Koch, NP  amLODipine (NORVASC) 5 MG tablet  03/11/17   [provider]  aspirin 81 MG tablet Take 81 mg by mouth daily.    [provider]  atorvastatin (LIPITOR) 40 MG tablet TAKE 1 TABLET (40 MG TOTAL) BY MOUTH DAILY. 07/24/17   Crecencio Mc, MD  brompheniramine-pseudoephedrine-DM 30-2-10 MG/5ML syrup Take 2.5 mLs by mouth at  bedtime as needed. 03/13/17   Burnard Hawthorne, FNP  CALCIUM-MAGNESIUM-ZINC PO Take by mouth 3 (three) times daily.    [provider]  Cholecalciferol (VITAMIN D-3 PO) Take 1,000 Units by mouth.    [provider]  clonazePAM (KLONOPIN) 1 MG tablet Take 1 tablet (1 mg total) by mouth daily as needed. Patient not taking: Reported on 08/20/2018 10/01/17   Burnard Hawthorne, FNP  escitalopram (LEXAPRO) 10 MG tablet TAKE 1 TABLET BY MOUTH EVERY DAY 12/11/17    Leone Haven, MD  ezetimibe (ZETIA) 10 MG tablet TAKE 1 TABLET BY MOUTH EVERY DAY 07/08/18   Minna Merritts, MD  GARLIC PO Take 536 mg by mouth.    [provider]  levothyroxine (SYNTHROID, LEVOTHROID) 137 MCG tablet TAKE 1 TABLET (137 MCG TOTAL) BY MOUTH DAILY BEFORE BREAKFAST. 12/03/17   Leone Haven, MD  losartan (COZAAR) 100 MG tablet TAKE 1 TABLET BY MOUTH EVERY DAY 06/21/18   Crecencio Mc, MD  Multiple Vitamins-Minerals (MULTIVITAMIN WITH MINERALS) tablet Take 1 tablet by mouth daily.    [provider]  omeprazole (PRILOSEC) 20 MG capsule Take 1 capsule (20 mg total) by mouth daily. 08/20/18   Leone Haven, MD  raloxifene (EVISTA) 60 MG tablet Take 60 mg by mouth daily. 01/30/17   [provider]  traZODone (DESYREL) 50 MG tablet Take 2 tablets (100 mg total) by mouth at bedtime as needed for sleep. 09/03/18   Leone Haven, MD  vitamin C (ASCORBIC ACID) 500 MG tablet Take 500 mg by mouth daily.    [provider]    Allergies Codeine, Iodinated diagnostic agents, and Penicillins  Family History  Problem Relation Age of Onset  . Colon polyps Father        age 85  . Heart disease Father   . Heart disease Mother   . Heart disease Paternal Grandmother   . Heart disease Paternal Grandfather   . Cancer Neg Hx   . Diabetes Neg Hx   . Breast cancer Neg Hx     Social History Social History   Tobacco Use  . Smoking status: Never Smoker  . Smokeless tobacco: Never Used  Substance Use Topics  . Alcohol use: Yes    Alcohol/week: 0.0 standard drinks  . Drug use: No    Review of Systems Constitutional: No fever/chills Eyes: No visual changes. ENT: No sore throat. Cardiovascular: Denies chest pain. Respiratory: Denies shortness of breath. Gastrointestinal: No abdominal pain.  No nausea, no vomiting.  No diarrhea.  No constipation. Genitourinary: Negative for dysuria. Musculoskeletal: Negative for back pain. Skin:  Negative for rash. Neurological: Negative for headaches, focal weakness or numbness. Psychiatric:  Anxiety and depression Endocrine:  Hyperlipidemia and hypothyroidism. Hematological/Lymphatic:  Allergic/Immunilogical: Codeine, IVP dye, and penicillin. ____________________________________________   PHYSICAL EXAM:  VITAL SIGNS: ED Triage Vitals  Enc Vitals Group     BP 09/04/18 1230 131/87     Pulse Rate 09/04/18 1230 98     Resp 09/04/18 1230 18     Temp 09/04/18 1230 98.5 F (36.9 C)     Temp src --      SpO2 09/04/18 1230 100 %     Weight 09/04/18 1224 160 lb (72.6 kg)     Height 09/04/18 1224 5\' 1"  (1.549 m)     Head Circumference --      Peak Flow --      Pain Score 09/04/18 1224 4  Pain Loc --      Pain Edu? --      Excl. in [place]? --    Constitutional: Alert and oriented. Well appearing and in no acute distress. Cardiovascular: Normal rate, regular rhythm. Grossly normal heart sounds.  Good peripheral circulation. Respiratory: Normal respiratory effort.  No retractions. Lungs CTAB. Musculoskeletal: No lower extremity tenderness nor edema.  No joint effusions. Neurologic:  Normal speech and language. No gross focal neurologic deficits are appreciated. No gait instability. Skin: Laceration to the dorsal aspect of the left index finger.  Psychiatric: Mood and affect are normal. Speech and behavior are normal.  ____________________________________________   LABS (all labs ordered are listed, but only abnormal results are displayed)  Labs Reviewed - No data to display ____________________________________________  EKG   ____________________________________________  RADIOLOGY  ED MD interpretation:    Official radiology report(s): No results found.  ____________________________________________   PROCEDURES  Procedure(s) performed (including Critical Care):  Procedures   ____________________________________________   INITIAL IMPRESSION / ASSESSMENT  AND PLAN / ED COURSE  As part of my medical decision making, I reviewed the following data within the [place] was evaluated in Emergency Department on 09/04/2018 for the symptoms described in the history of present illness. She was evaluated in the context of the global COVID-19 pandemic, which necessitated consideration that the patient might be at risk for infection with the SARS-CoV-2 virus that causes COVID-19. Institutional protocols and algorithms that pertain to the evaluation of patients at risk for COVID-19 are in a state of rapid change based on information released by regulatory bodies including the CDC and federal and state organizations. These policies and algorithms were followed during the patient's care in the ED.   Patient presents with superficial laceration to the dorsal aspect of the left index finger.  See procedure note for wound closing.  Patient given discharge care instruction and advised him to ED if wound reopens for healing is complete.             ____________________________________________   FINAL CLINICAL IMPRESSION(S) / ED DIAGNOSES  Final diagnoses:  Laceration of left index finger without foreign body without damage to nail, initial encounter     ED Discharge Orders    None       Note:  This document was prepared using Dragon voice recognition software and may include unintentional dictation errors.    Sable Feil, PA-C 09/04/18 1318    Merlyn Lot, MD 09/04/18 [phone]

## 2018-09-04 NOTE — Discharge Instructions (Addendum)
Follow discharge care instructions and wear splint for 2 to 3 days.

## 2018-09-04 NOTE — ED Notes (Signed)
Pt verbalized understanding of discharge instructions. NAD at this time. 

## 2018-09-04 NOTE — ED Triage Notes (Signed)
Pt in via POV, states she was packing, cut her left index finger w/ a broken ceramic platter.  Bleeding controlled upon arrival.  Pt is unsure of last tetanus shot.

## 2018-09-13 ENCOUNTER — Other Ambulatory Visit: Payer: Self-pay | Admitting: Internal Medicine

## 2018-09-13 ENCOUNTER — Other Ambulatory Visit: Payer: Self-pay | Admitting: Family Medicine

## 2018-09-16 ENCOUNTER — Encounter: Payer: Self-pay | Admitting: *Deleted

## 2018-09-25 ENCOUNTER — Other Ambulatory Visit: Payer: Self-pay

## 2018-09-25 ENCOUNTER — Telehealth: Payer: Self-pay

## 2018-09-25 DIAGNOSIS — Z1211 Encounter for screening for malignant neoplasm of colon: Secondary | ICD-10-CM

## 2018-09-25 MED ORDER — NA SULFATE-K SULFATE-MG SULF 17.5-3.13-1.6 GM/177ML PO SOLN: 1.0000 | Freq: Once | ORAL | 0 refills | Status: AC

## 2018-09-25 NOTE — Telephone Encounter (Signed)
Gastroenterology Pre-Procedure Review  Request Date: 10/15/18 Requesting Physician: Dr. Allen Norris  PATIENT REVIEW QUESTIONS: The patient responded to the following health history questions as indicated:    1. Are you having any GI issues? no 2. Do you have a personal history of Polyps? yes (yes 2008 Dr. Allen Norris) 3. Do you have a family history of Colon Cancer or Polyps? no 4. Diabetes Mellitus? no 5. Joint replacements in the past 12 months?no 6. Major health problems in the past 3 months?no 7. Any artificial heart valves, MVP, or defibrillator?no    MEDICATIONS & ALLERGIES:    Patient reports the following regarding taking any anticoagulation/antiplatelet therapy:   Plavix, Coumadin, Eliquis, Xarelto, Lovenox, Pradaxa, Brilinta, or Effient? no Aspirin? yes (81 mg daily)  Patient confirms/reports the following medications:  Current Outpatient Medications  Medication Sig Dispense Refill  . albuterol (PROVENTIL HFA;VENTOLIN HFA) 108 (90 Base) MCG/ACT inhaler Inhale 2 puffs into the lungs every 6 (six) hours as needed for wheezing or shortness of breath. 1 Inhaler 0  . amLODipine (NORVASC) 5 MG tablet   2  . aspirin 81 MG tablet Take 81 mg by mouth daily.    Marland Kitchen atorvastatin (LIPITOR) 40 MG tablet TAKE 1 TABLET (40 MG TOTAL) BY MOUTH DAILY. 90 tablet 0  . brompheniramine-pseudoephedrine-DM 30-2-10 MG/5ML syrup Take 2.5 mLs by mouth at bedtime as needed. 120 mL 0  . CALCIUM-MAGNESIUM-ZINC PO Take by mouth 3 (three) times daily.    . Cholecalciferol (VITAMIN D-3 PO) Take 1,000 Units by mouth.    . clonazePAM (KLONOPIN) 1 MG tablet Take 1 tablet (1 mg total) by mouth daily as needed. (Patient not taking: Reported on 08/20/2018) 30 tablet 0  . escitalopram (LEXAPRO) 10 MG tablet TAKE 1 TABLET BY MOUTH EVERY DAY 90 tablet 0  . ezetimibe (ZETIA) 10 MG tablet TAKE 1 TABLET BY MOUTH EVERY DAY 15 tablet 0  . GARLIC PO Take 0000000 mg by mouth.    . levothyroxine (SYNTHROID, LEVOTHROID) 137 MCG tablet TAKE 1  TABLET (137 MCG TOTAL) BY MOUTH DAILY BEFORE BREAKFAST. 90 tablet 0  . losartan (COZAAR) 25 MG tablet TAKE 4 TABLETS BY MOUTH EVERY DAY 360 tablet 0  . Multiple Vitamins-Minerals (MULTIVITAMIN WITH MINERALS) tablet Take 1 tablet by mouth daily.    Marland Kitchen omeprazole (PRILOSEC) 20 MG capsule TAKE 1 CAPSULE BY MOUTH EVERY DAY 30 capsule 0  . raloxifene (EVISTA) 60 MG tablet Take 60 mg by mouth daily.  3  . traZODone (DESYREL) 50 MG tablet Take 2 tablets (100 mg total) by mouth at bedtime as needed for sleep. 60 tablet 3  . vitamin C (ASCORBIC ACID) 500 MG tablet Take 500 mg by mouth daily.     No current facility-administered medications for this visit.     Patient confirms/reports the following allergies:  Allergies  Allergen Reactions  . Codeine Anaphylaxis  . Iodinated Diagnostic Agents Hives  . Penicillins Rash    No orders of the defined types were placed in this encounter.   AUTHORIZATION INFORMATION Primary Insurance: 1D#: Group #:  Secondary Insurance: 1D#: Group #:  SCHEDULE INFORMATION: Date:  Time: Location:

## 2018-10-01 ENCOUNTER — Encounter: Payer: 59 | Admitting: Family Medicine

## 2018-10-08 ENCOUNTER — Other Ambulatory Visit: Payer: Self-pay | Admitting: Family Medicine

## 2018-10-11 ENCOUNTER — Other Ambulatory Visit: Admission: RE | Admit: 2018-10-11 | Payer: 59 | Source: Ambulatory Visit

## 2018-10-14 ENCOUNTER — Other Ambulatory Visit: Payer: Self-pay

## 2018-10-15 ENCOUNTER — Encounter: Payer: Self-pay | Admitting: Family Medicine

## 2018-10-15 ENCOUNTER — Other Ambulatory Visit: Payer: Self-pay

## 2018-10-15 ENCOUNTER — Ambulatory Visit (INDEPENDENT_AMBULATORY_CARE_PROVIDER_SITE_OTHER): Payer: 59 | Admitting: Family Medicine

## 2018-10-15 VITALS — BP 120/70 | HR 78 | Temp 97.7°F | Ht 61.0 in | Wt 166.6 lb

## 2018-10-15 DIAGNOSIS — E039 Hypothyroidism, unspecified: Secondary | ICD-10-CM | POA: Diagnosis not present

## 2018-10-15 DIAGNOSIS — E785 Hyperlipidemia, unspecified: Secondary | ICD-10-CM | POA: Diagnosis not present

## 2018-10-15 DIAGNOSIS — R109 Unspecified abdominal pain: Secondary | ICD-10-CM

## 2018-10-15 DIAGNOSIS — N649 Disorder of breast, unspecified: Secondary | ICD-10-CM

## 2018-10-15 DIAGNOSIS — Z853 Personal history of malignant neoplasm of breast: Secondary | ICD-10-CM

## 2018-10-15 DIAGNOSIS — Z85850 Personal history of malignant neoplasm of thyroid: Secondary | ICD-10-CM

## 2018-10-15 DIAGNOSIS — Z0001 Encounter for general adult medical examination with abnormal findings: Secondary | ICD-10-CM

## 2018-10-15 DIAGNOSIS — R490 Dysphonia: Secondary | ICD-10-CM

## 2018-10-15 DIAGNOSIS — G5603 Carpal tunnel syndrome, bilateral upper limbs: Secondary | ICD-10-CM

## 2018-10-15 DIAGNOSIS — I10 Essential (primary) hypertension: Secondary | ICD-10-CM

## 2018-10-15 NOTE — Patient Instructions (Signed)
Nice to see you. We will get labs today. We will get you to see a breast surgeon. We will get a mammogram scheduled. We will get an ultrasound of your thyroid scheduled. Please try the Flonase. Please consider the shingles vaccine.

## 2018-10-16 LAB — CBC WITH DIFFERENTIAL/PLATELET
Basophils Absolute: 0.1 10*3/uL (ref 0.0–0.1)
Basophils Relative: 0.8 % (ref 0.0–3.0)
Eosinophils Absolute: 0.2 10*3/uL (ref 0.0–0.7)
Eosinophils Relative: 2 % (ref 0.0–5.0)
HCT: 38.7 % (ref 36.0–46.0)
Hemoglobin: 12.8 g/dL (ref 12.0–15.0)
Lymphocytes Relative: 30.8 % (ref 12.0–46.0)
Lymphs Abs: 3.1 10*3/uL (ref 0.7–4.0)
MCHC: 33.2 g/dL (ref 30.0–36.0)
MCV: 87.6 fl (ref 78.0–100.0)
Monocytes Absolute: 0.6 10*3/uL (ref 0.1–1.0)
Monocytes Relative: 5.8 % (ref 3.0–12.0)
Neutro Abs: 6.1 10*3/uL (ref 1.4–7.7)
Neutrophils Relative %: 60.6 % (ref 43.0–77.0)
Platelets: 399 10*3/uL (ref 150.0–400.0)
RBC: 4.42 Mil/uL (ref 3.87–5.11)
RDW: 13.4 % (ref 11.5–15.5)
WBC: 10.1 10*3/uL (ref 4.0–10.5)

## 2018-10-16 LAB — COMPREHENSIVE METABOLIC PANEL
ALT: 21 U/L (ref 0–35)
AST: 12 U/L (ref 0–37)
Albumin: 4.5 g/dL (ref 3.5–5.2)
Alkaline Phosphatase: 99 U/L (ref 39–117)
BUN: 16 mg/dL (ref 6–23)
CO2: 27 mEq/L (ref 19–32)
Calcium: 10.1 mg/dL (ref 8.4–10.5)
Chloride: 100 mEq/L (ref 96–112)
Creatinine, Ser: 0.62 mg/dL (ref 0.40–1.20)
GFR: 97.5 mL/min (ref >60.00)
Glucose, Bld: 89 mg/dL (ref 70–99)
Potassium: 4 mEq/L (ref 3.5–5.1)
Sodium: 139 mEq/L (ref 135–145)
Total Bilirubin: 0.6 mg/dL (ref 0.2–1.2)
Total Protein: 7.1 g/dL (ref 6.0–8.3)

## 2018-10-16 LAB — LIPID PANEL
Cholesterol: 171 mg/dL (ref 0–200)
HDL: 49.4 mg/dL (ref >39.00)
LDL Cholesterol: 84 mg/dL (ref 0–99)
NonHDL: 121.98
Total CHOL/HDL Ratio: 3
Triglycerides: 190 mg/dL — ABNORMAL HIGH (ref 0.0–149.0)
VLDL: 38 mg/dL (ref 0.0–40.0)

## 2018-10-16 LAB — HEMOGLOBIN A1C: Hgb A1c MFr Bld: 6.2 % (ref 4.6–6.5)

## 2018-10-16 LAB — TSH: TSH: 5.56 u[IU]/mL — ABNORMAL HIGH (ref 0.35–4.50)

## 2018-10-19 ENCOUNTER — Other Ambulatory Visit: Payer: Self-pay | Admitting: Family Medicine

## 2018-10-19 DIAGNOSIS — R490 Dysphonia: Secondary | ICD-10-CM | POA: Insufficient documentation

## 2018-10-19 DIAGNOSIS — Z85850 Personal history of malignant neoplasm of thyroid: Secondary | ICD-10-CM | POA: Insufficient documentation

## 2018-10-19 DIAGNOSIS — N649 Disorder of breast, unspecified: Secondary | ICD-10-CM | POA: Insufficient documentation

## 2018-10-19 DIAGNOSIS — R109 Unspecified abdominal pain: Secondary | ICD-10-CM | POA: Insufficient documentation

## 2018-10-19 DIAGNOSIS — Z853 Personal history of malignant neoplasm of breast: Secondary | ICD-10-CM | POA: Insufficient documentation

## 2018-10-19 DIAGNOSIS — E890 Postprocedural hypothyroidism: Secondary | ICD-10-CM

## 2018-10-19 MED ORDER — LEVOTHYROXINE SODIUM 150 MCG PO TABS: 150.0000 ug | ORAL_TABLET | Freq: Every day | ORAL | 1 refills | Status: DC

## 2018-10-19 NOTE — Assessment & Plan Note (Signed)
Possibly allergy related.  We will start her on Flonase.  Will obtain a thyroid ultrasound to ensure there is nothing going on there given her history of thyroid cancer.

## 2018-10-19 NOTE — Progress Notes (Signed)
[name], MD Phone: [phone]  Christie Ramirez is a 63 y.o. female who presents today for CPE.  Exercise: She is getting 5000 steps daily walking. Diet: Healthy with proteins and vegetables.  No fried foods.  Cut potatoes down.  Decreased bread intake. Colonoscopy is scheduled. Mammogram needs to be completed. Pap smear: Patient is status post hysterectomy for endometriosis.  Last Pap smear was 07/21/2015.  Negative for abnormal cells and HPV.  Last GYN evaluation noted her cervix was surgically absent. She declines flu vaccine.  Tetanus vaccine up-to-date.  Shingrix vaccine discussed. She has a personal history of breast cancer and was following with the breast surgeon previously.  No family history of ovarian cancer or colon cancer. Denies tobacco use and illicit drug use.  She has about 6-8.1 alcoholic beverages daily.  This is down from 3 alcoholic beverages daily.  She sees a Pharmacist, community and an ophthalmologist.  Left breast cyst: Patient notes she saw dermatology for this.  She has had this for many years though is progressively enlarging now.  Dermatology would like to remove this.  Right hand weakness: Patient notes this has been going on for quite some time.  She has difficulty opening bottles.  She does have tingling in the morning in her right hand.  She has no numbness.  She is been diagnosed with carpal tunnel syndrome previously.  She is wearing a cock-up splint.  She denies weakness elsewhere.  Right-sided abdominal pain: This is dull and intermittent.  Ongoing for about 4 months.  Has been stable.  No injury.  Hoarseness: Patient notes she typically notices this when she has been in a crowd.  She does have a history of thyroid cancer back in 1995 and notes she was released by her specialist in 2000.  She does note some postnasal drainage with sneezing and cough at night that is been an ongoing issue for a number of months.  Active Ambulatory Problems    Diagnosis Date  Noted  . Hypothyroidism 10/12/2010  . Depression 10/12/2010  . Hypertension 05/18/2011  . Anxiety 05/18/2011  . Hyperlipidemia with target low density lipoprotein (LDL) cholesterol less than 100 mg/dL 06/26/2011  . Breast cancer of upper-inner quadrant of right female breast ([place]) 04/08/2013  . Insomnia 06/09/2013  . Colon cancer screening 07/28/2013  . OSA (obstructive sleep apnea) 09/19/2013  . History of herpes genitalis 09/23/2014  . Endometriosis 09/23/2014  . Chronic bronchitis ([place]) 01/29/2015  . Right hip pain 08/11/2015  . Osteopenia 08/11/2015  . Fatigue 10/01/2015  . Acute pain of right shoulder 07/06/2016  . Acute pain of left knee 07/27/2016  . Neck pain 10/30/2016  . Encounter for general adult medical examination with abnormal findings 02/03/2017  . Palpitations 02/03/2017  . Rectal bleeding 02/03/2017  . Focal night sweats 02/03/2017  . Carpal tunnel syndrome 02/03/2017  . GERD (gastroesophageal reflux disease) 02/03/2017  . Carotid artery plaque 03/05/2017  . Left shoulder pain 08/25/2018  . Pain of left calf 08/25/2018  . Breast lesion 10/19/2018  . History of thyroid cancer 10/19/2018  . Abdominal pain 10/19/2018  . History of breast cancer 10/19/2018  . Hoarseness 10/19/2018   Resolved Ambulatory Problems    Diagnosis Date Noted  . Hyperlipidemia 10/12/2010  . Burn of hand, left, second degree 02/21/2011  . Numbness and tingling in hands 02/21/2011  . Bronchitis with airway obstruction ([place]) 02/14/2012  . Ankle injury 08/16/2012  . Elevated liver function tests 11/18/2012  . Screening for breast cancer 11/18/2012  .  Mid back pain 01/02/2013  . Routine general medical examination at a health care facility 02/18/2013  . Preop general physical exam 02/19/2013  . Abnormal mammogram 03/28/2013  . Acute nonsuppurative otitis media of right ear 07/22/2013  . Other malaise and fatigue 07/22/2013  . Burn of hand, left, second degree 12/12/2013  .  Piriformis syndrome of left side 07/17/2014  . Acute URI 10/19/2014  . Dysuria 10/22/2014  . UTI (urinary tract infection) 10/22/2014  . Yeast infection 10/22/2014  . Trichomonal infection 11/06/2014  . Trapezius strain 12/16/2014  . Bronchitis 01/04/2015  . Ankle fracture, left 02/07/2015  . Low back pain 08/11/2015  . Lip swelling 08/19/2015  . Headache 08/20/2015  . Bronchitis 10/19/2015  . Fibula fracture 11/11/2015   Past Medical History:  Diagnosis Date  . Cancer ([place]) 1995    Family History  Problem Relation Age of Onset  . Colon polyps Father        age 95  . Heart disease Father   . Heart disease Mother   . Heart disease Paternal Grandmother   . Heart disease Paternal Grandfather   . Cancer Neg Hx   . Diabetes Neg Hx   . Breast cancer Neg Hx     Social History   Socioeconomic History  . Marital status: Married    Spouse name: Not on file  . Number of children: Not on file  . Years of education: Not on file  . Highest education level: Not on file  Occupational History  . Not on file  Social Needs  . Financial resource strain: Not on file  . Food insecurity    Worry: Not on file    Inability: Not on file  . Transportation needs    Medical: Not on file    Non-medical: Not on file  Tobacco Use  . Smoking status: Never Smoker  . Smokeless tobacco: Never Used  Substance and Sexual Activity  . Alcohol use: Yes    Alcohol/week: 0.0 standard drinks  . Drug use: No  . Sexual activity: Yes    Birth control/protection: Surgical  Lifestyle  . Physical activity    Days per week: Not on file    Minutes per session: Not on file  . Stress: Not on file  Relationships  . Social Herbalist on phone: Not on file    Gets together: Not on file    Attends religious service: Not on file    Active member of club or organization: Not on file    Attends meetings of clubs or organizations: Not on file    Relationship status: Not on file  . Intimate  partner violence    Fear of current or ex partner: Not on file    Emotionally abused: Not on file    Physically abused: Not on file    Forced sexual activity: Not on file  Other Topics Concern  . Not on file  Social History Narrative  . Not on file    ROS  General:  Negative for nexplained weight loss, fever Skin: Positive for enlarging cyst, negative for new or changing mole, sore that won't heal HEENT: Positive for hoarseness and trouble swallowing, negative for trouble hearing, trouble seeing, ringing in ears, mouth sores, change in voice. CV: Positive for edema, negative for chest pain, dyspnea, palpitations Resp: Positive for cough, negative for dyspnea, hemoptysis GI: Positive for abdominal pain, negative for nausea, vomiting, diarrhea, constipation, melena, hematochezia. GU: Negative for dysuria, incontinence,  urinary hesitance, hematuria, vaginal or penile discharge, polyuria, sexual difficulty, lumps in testicle or breasts MSK: Positive for muscle cramps or aches, joint pain or swelling Neuro: Positive for right hand weakness, negative for headaches, numbness, dizziness, passing out/fainting Psych: Positive for memory problems, negative for depression, anxiety  Objective  Physical Exam Vitals:   10/15/18 1433  BP: 120/70  Pulse: 78  Temp: 97.7 F (36.5 C)  SpO2: 98%    BP Readings from Last 3 Encounters:  10/15/18 120/70  09/04/18 131/87  04/18/17 (!) 132/92   Wt Readings from Last 3 Encounters:  10/15/18 166 lb 9.6 oz (75.6 kg)  09/04/18 160 lb (72.6 kg)  08/20/18 161 lb (73 kg)    Physical Exam Constitutional:      General: She is not in acute distress.    Appearance: She is not diaphoretic.  HENT:     Head: Normocephalic and atraumatic.     Mouth/Throat:     Mouth: Mucous membranes are moist.     Pharynx: Oropharynx is clear.  Eyes:     Conjunctiva/sclera: Conjunctivae normal.     Pupils: Pupils are equal, round, and reactive to light.  Neck:      Comments: No abnormalities felt on palpation of previous site of thyroid Cardiovascular:     Rate and Rhythm: Normal rate and regular rhythm.     Heart sounds: Normal heart sounds.  Pulmonary:     Effort: Pulmonary effort is normal.     Breath sounds: Normal breath sounds.  Chest:    Abdominal:     General: Bowel sounds are normal. There is no distension.     Palpations: Abdomen is soft.     Tenderness: There is no abdominal tenderness. There is no guarding or rebound.  Musculoskeletal:     Right lower leg: No edema.     Left lower leg: No edema.  Skin:    General: Skin is warm and dry.  Neurological:     Mental Status: She is alert.     Comments: 5/5 strength in bilateral biceps, triceps, grip, quads, hamstrings, plantar and dorsiflexion, sensation to light touch intact in bilateral UE and LE, normal gait  Psychiatric:        Mood and Affect: Mood normal.      Assessment/Plan:   Encounter for general adult medical examination with abnormal findings Physical exam completed.  Encouraged continued healthy diet and exercise.  She will keep her scheduled colonoscopy.  We will order diagnostic mammogram and ultrasound given the likely cyst that has been palpated though I do suspect that is more of a dermatologic issue than a breast issue.  We will get her referred to a general surgeon to follow-up on this as well.  She will consider the shingles vaccine.  She will continue to work on decreasing her alcohol intake.  Pap smear is not due.  Lab work as outlined below.  History of thyroid cancer Patient previously released by her specialist.  I do not suspect the hoarseness is related to this though we will obtain an ultrasound to evaluate further.  History of breast cancer Refer to general surgery to follow-up.  Breast lesion I suspect this is a subcutaneous cyst.  We will refer her to a breast surgeon to evaluate for removal.  Diagnostic mammogram and ultrasound ordered as well.   Carpal tunnel syndrome I suspect her hand symptoms are likely related to carpal tunnel versus joint stiffness.  She has a benign exam today.  We  will reevaluate after use of cock-up splint and if not improving refer for evaluation.  Abdominal pain Undetermined cause.  No symptoms today and she does have a benign exam.  We will have her return in 3 weeks to reevaluate.  We will check labs.  Hoarseness Possibly allergy related.  We will start her on Flonase.  Will obtain a thyroid ultrasound to ensure there is nothing going on there given her history of thyroid cancer.   Orders Placed This Encounter  Procedures  . MM DIAG BREAST TOMO BILATERAL    Standing Status:   Future    Standing Expiration Date:   12/15/2019    Order Specific Question:   Reason for Exam (SYMPTOM  OR DIAGNOSIS REQUIRED)    Answer:   left breast lesion at 50 oclock location, present for many years though now enlarging    Order Specific Question:   Preferred imaging location?    Answer:   [facility]  . US BREAST LTD UNI RIGHT INC AXILLA    Standing Status:   Future    Standing Expiration Date:   12/15/2019    Order Specific Question:   Reason for Exam (SYMPTOM  OR DIAGNOSIS REQUIRED)    Answer:   left breast lesion at 4 oclock location, present for many years though now enlarging    Order Specific Question:   Preferred imaging location?    Answer:   [facility]  . US BREAST LTD UNI LEFT INC AXILLA    Standing Status:   Future    Standing Expiration Date:   12/15/2019    Order Specific Question:   Reason for Exam (SYMPTOM  OR DIAGNOSIS REQUIRED)    Answer:   left breast lesion at 50 oclock location, present for many years though now enlarging    Order Specific Question:   Preferred imaging location?    Answer:   [facility]  . US THYROID    Standing Status:   Future    Standing Expiration Date:   12/15/2019    Order Specific Question:   Reason for Exam (SYMPTOM  OR DIAGNOSIS REQUIRED)     Answer:   history of thyroid cancer in 1995, recent increasing hoarsenss    Order Specific Question:   Preferred [facility] location?    [facility]  . Comp Met ([facility])  . CBC w/Diff  . TSH  . Lipid panel  . HgB A1c  . Ambulatory referral to General Surgery    Referral Priority:   Routine    Referral Type:   Surgical    Referral Reason:   Specialty Services Required    Requested Specialty:   General Surgery    Number of Visits Requested:   1    No orders of the defined types were placed in this encounter.  Patient will be reevaluated in 3 weeks to follow-up on the acute issues discussed today and the other issues that we were not able to discuss.  [name], MD [place]

## 2018-10-19 NOTE — Assessment & Plan Note (Signed)
I suspect this is a subcutaneous cyst.  We will refer her to a breast surgeon to evaluate for removal.  Diagnostic mammogram and ultrasound ordered as well.

## 2018-10-19 NOTE — Assessment & Plan Note (Signed)
Undetermined cause.  No symptoms today and she does have a benign exam.  We will have her return in 3 weeks to reevaluate.  We will check labs.

## 2018-10-19 NOTE — Assessment & Plan Note (Signed)
Physical exam completed.  Encouraged continued healthy diet and exercise.  She will keep her scheduled colonoscopy.  We will order diagnostic mammogram and ultrasound given the likely cyst that has been palpated though I do suspect that is more of a dermatologic issue than a breast issue.  We will get her referred to a general surgeon to follow-up on this as well.  She will consider the shingles vaccine.  She will continue to work on decreasing her alcohol intake.  Pap smear is not due.  Lab work as outlined below.

## 2018-10-19 NOTE — Assessment & Plan Note (Signed)
I suspect her hand symptoms are likely related to carpal tunnel versus joint stiffness.  She has a benign exam today.  We will reevaluate after use of cock-up splint and if not improving refer for evaluation.

## 2018-10-19 NOTE — Assessment & Plan Note (Signed)
Refer to general surgery to follow-up.

## 2018-10-19 NOTE — Assessment & Plan Note (Signed)
Patient previously released by her specialist.  I do not suspect the hoarseness is related to this though we will obtain an ultrasound to evaluate further.

## 2018-10-22 ENCOUNTER — Telehealth: Payer: Self-pay | Admitting: Family Medicine

## 2018-10-22 MED ORDER — LOSARTAN POTASSIUM 25 MG PO TABS: 100.0000 mg | ORAL_TABLET | Freq: Every day | ORAL | 0 refills | Status: DC

## 2018-10-22 MED ORDER — EZETIMIBE 10 MG PO TABS: 10.0000 mg | ORAL_TABLET | Freq: Every day | ORAL | 1 refills | Status: DC

## 2018-10-22 NOTE — Telephone Encounter (Signed)
Refill sent for losartan.    Rx for ezetimibe (Zetia) was prescribed by a historical provider.  Do you want to refill this or have patient contact historical provider?

## 2018-10-22 NOTE — Telephone Encounter (Signed)
Medication Refill - Medication: ezetimibe (ZETIA) 10 MG tablet +  losartan (COZAAR) 100 MG tablet   Has the patient contacted their pharmacy? Yes.   (Agent: If no, request that the patient contact the pharmacy for the refill.) (Agent: If yes, when and what did the pharmacy advise?)  Preferred Pharmacy (with phone number or street name):  [id number] [place]  [address] [place] [address]  Phone: [phone] Fax: [phone]    Agent: Please be advised that RX refills may take up to 3 business days. We ask that you follow-up with your pharmacy.

## 2018-10-22 NOTE — Telephone Encounter (Signed)
Refill sent to pharmacy.   

## 2018-10-23 ENCOUNTER — Ambulatory Visit
Admission: RE | Admit: 2018-10-23 | Discharge: 2018-10-23 | Disposition: A | Discharge status: Home / Self Care | Payer: 59 | Source: Ambulatory Visit | Attending: Family Medicine | Admitting: Family Medicine

## 2018-10-23 ENCOUNTER — Other Ambulatory Visit: Payer: Self-pay

## 2018-10-23 DIAGNOSIS — Z85850 Personal history of malignant neoplasm of thyroid: Secondary | ICD-10-CM | POA: Diagnosis present

## 2018-10-24 ENCOUNTER — Telehealth: Payer: Self-pay | Admitting: Family Medicine

## 2018-10-24 ENCOUNTER — Other Ambulatory Visit: Payer: Self-pay | Admitting: Family Medicine

## 2018-10-24 DIAGNOSIS — E890 Postprocedural hypothyroidism: Secondary | ICD-10-CM

## 2018-10-24 NOTE — Telephone Encounter (Signed)
Pt given lab results per notes of Dr Caryl Bis on 10/19/2018. Pt verbalized understanding. Patient has question triage unable to answer Will rout note for followup to office

## 2018-10-25 ENCOUNTER — Telehealth: Payer: Self-pay

## 2018-10-25 NOTE — Telephone Encounter (Signed)
Copied from [place] [id number]. Topic: General - Other >> Oct 24, 2018  5:12 PM Sheran Luz wrote: Patient requesting call back from [place].

## 2018-10-25 NOTE — Telephone Encounter (Signed)
Copied from [place] [id number]. Topic: General - Other >> Oct 25, 2018  8:23 AM Keene Breath wrote: Reason for CRM: Patient would like for the nurse to call her regarding a message she received about a surgeon.  Please advise and call patient to discuss.  CB# [phone]

## 2018-10-25 NOTE — Telephone Encounter (Signed)
I called and left a voicemail for pt to call back, the surgeon that the provider is talking about is about the cyst on the patient breast this was through mychart.  Nina,cma

## 2018-10-28 ENCOUNTER — Other Ambulatory Visit: Payer: Self-pay

## 2018-10-29 ENCOUNTER — Ambulatory Visit
Admission: RE | Admit: 2018-10-29 | Discharge: 2018-10-29 | Disposition: A | Discharge status: Home / Self Care | Payer: 59 | Source: Ambulatory Visit | Attending: Family Medicine | Admitting: Family Medicine

## 2018-10-29 DIAGNOSIS — N649 Disorder of breast, unspecified: Secondary | ICD-10-CM

## 2018-10-30 ENCOUNTER — Telehealth: Payer: Self-pay | Admitting: Family Medicine

## 2018-10-30 NOTE — Telephone Encounter (Signed)
Patient has been called & message left to call back.

## 2018-10-30 NOTE — Telephone Encounter (Signed)
Patient reporting she has already heard from surgeon for appointment to remove cyst. Appointment has been scheduled. Repeat TSH has been scheduled for 11/22/18. Reviewed follow-up appointment on 11/06/18 with Dr.Sonnenberg for abdominal pain.

## 2018-11-03 ENCOUNTER — Other Ambulatory Visit: Payer: Self-pay | Admitting: Family Medicine

## 2018-11-03 DIAGNOSIS — R490 Dysphonia: Secondary | ICD-10-CM

## 2018-11-05 ENCOUNTER — Other Ambulatory Visit: Payer: Self-pay

## 2018-11-06 ENCOUNTER — Other Ambulatory Visit
Admission: RE | Admit: 2018-11-06 | Discharge: 2018-11-06 | Disposition: A | Discharge status: Home / Self Care | Payer: 59 | Source: Ambulatory Visit | Attending: Family Medicine | Admitting: Family Medicine

## 2018-11-06 ENCOUNTER — Encounter: Payer: Self-pay | Admitting: Family Medicine

## 2018-11-06 ENCOUNTER — Telehealth: Payer: Self-pay | Admitting: Family Medicine

## 2018-11-06 ENCOUNTER — Ambulatory Visit (INDEPENDENT_AMBULATORY_CARE_PROVIDER_SITE_OTHER): Payer: 59 | Admitting: Family Medicine

## 2018-11-06 ENCOUNTER — Telehealth: Payer: Self-pay

## 2018-11-06 ENCOUNTER — Other Ambulatory Visit: Payer: Self-pay

## 2018-11-06 VITALS — BP 130/80 | HR 92 | Temp 97.8°F | Ht 61.0 in | Wt 170.4 lb

## 2018-11-06 DIAGNOSIS — E890 Postprocedural hypothyroidism: Secondary | ICD-10-CM

## 2018-11-06 DIAGNOSIS — B958 Unspecified staphylococcus as the cause of diseases classified elsewhere: Secondary | ICD-10-CM | POA: Insufficient documentation

## 2018-11-06 DIAGNOSIS — R61 Generalized hyperhidrosis: Secondary | ICD-10-CM

## 2018-11-06 DIAGNOSIS — N649 Disorder of breast, unspecified: Secondary | ICD-10-CM

## 2018-11-06 DIAGNOSIS — Z1211 Encounter for screening for malignant neoplasm of colon: Secondary | ICD-10-CM

## 2018-11-06 DIAGNOSIS — R109 Unspecified abdominal pain: Secondary | ICD-10-CM

## 2018-11-06 DIAGNOSIS — Z23 Encounter for immunization: Secondary | ICD-10-CM

## 2018-11-06 DIAGNOSIS — M79672 Pain in left foot: Secondary | ICD-10-CM | POA: Diagnosis not present

## 2018-11-06 MED ORDER — LEVOTHYROXINE SODIUM 175 MCG PO TABS: 175.0000 ug | ORAL_TABLET | Freq: Every day | ORAL | 1 refills | Status: DC

## 2018-11-06 NOTE — Assessment & Plan Note (Signed)
She will see general surgery next week.

## 2018-11-06 NOTE — Telephone Encounter (Signed)
I calledand left a message on the patient's voicemail for her to return a call  PEC can advise. to ask the patient if she has a allergy to contrast, the provider ordered her CT scan and wants to know also has she ever received a contrast with pretreatment since she found out about the allergy.

## 2018-11-06 NOTE — Assessment & Plan Note (Signed)
Plan for CT scan abdomen and pelvis to evaluate further.  After the patient left I noted she had a contrast allergy.  I will have the CMA contact the patient to determine if she has received contrast with pretreatment after developing the contrast allergy.  Once we confirm this we can plan on ordering CT abdomen pelvis either with or without contrast.

## 2018-11-06 NOTE — Patient Instructions (Signed)
Nice to see you. We will get lab work today and contact you with results. I referred you to podiatry. We will get a CT scan as well. Someone will contact you to schedule follow-up in 6 weeks for lab work and 3 months with me.

## 2018-11-06 NOTE — Assessment & Plan Note (Signed)
She is going to have a colonoscopy in the near future.  I will forward this note to the GI physician as she has reported some rectal bleeding.

## 2018-11-06 NOTE — Telephone Encounter (Signed)
Copied from [place] [id number]. Topic: Conservator, museum/gallery Patient (Clinic Use ONLY) >> Nov 06, 2018  2:07 PM Gordy Councilman, CMA wrote: To see if the patient has an allergy to contrast and if so did she have to have a pretreatment to contrast after founding out.  Patient is having a CT and the provider wants to k now.  Nina,cma >> Nov 06, 2018  2:57 PM Selinda Flavin B, NT wrote: Patient returning call. States that in 1988 she had a scan done to check her kidneys and was given a contrast with iodine and she states that she swelled up like a balloon. States that she has not had a procedure done since then that involved the need for contrast. Willing to do pretreatment if needed.

## 2018-11-06 NOTE — Progress Notes (Signed)
[name], MD Phone: [phone]  Christie Ramirez is a 63 y.o. female who presents today for follow-up.  Left foot pain: Patient notes this has been going on for over a year now.  Notes it is most sore at night.  It is sore along the plantar heel and lateral surface of the heel and then down the outside of her foot.  She notes breaking her foot several years ago x2 though no recent injury.  Describes the sensation as a hot sensation.  Night sweats: Patient notes for the last year or so she has had night sweats where she sweats all over in bed.  She notes no travel to any third world countries.  She has never been in prison.  No weight loss.  Abdominal pain: Patient notes right-sided abdominal discomfort this been going on for quite some time now.  It occurs twice a week and lasts for couple hours at a time.  Describes it as a heavy numbing sensation.  No nausea, vomiting, or constipation.  Occasional diarrhea.  She does report occasional blood when she wipes and on the stool.  She does have a history of internal hemorrhoids.  She has an upcoming colonoscopy scheduled.  Left breast cyst: She has a surgery appointment next week to evaluate this.  Social History   Tobacco Use  Smoking Status Never Smoker  Smokeless Tobacco Never Used     ROS see history of present illness  Objective  Physical Exam Vitals:   11/06/18 1056  BP: 130/80  Pulse: 92  Temp: 97.8 F (36.6 C)  SpO2: 97%    BP Readings from Last 3 Encounters:  11/06/18 130/80  10/15/18 120/70  09/04/18 131/87   Wt Readings from Last 3 Encounters:  11/06/18 170 lb 6.4 oz (77.3 kg)  10/15/18 166 lb 9.6 oz (75.6 kg)  09/04/18 160 lb (72.6 kg)    Physical Exam Constitutional:      General: She is not in acute distress.    Appearance: She is not diaphoretic.  Cardiovascular:     Rate and Rhythm: Normal rate and regular rhythm.     Heart sounds: Normal heart sounds.  Pulmonary:     Effort: Pulmonary  effort is normal.     Breath sounds: Normal breath sounds.  Abdominal:     General: Bowel sounds are normal. There is no distension.     Palpations: Abdomen is soft. There is no mass.     Tenderness: There is abdominal tenderness (Mild tenderness in the right mid abdomen). There is no guarding or rebound.  Musculoskeletal:     Comments: Left foot with no tenderness, no swelling, no skin changes  Skin:    General: Skin is warm and dry.  Neurological:     Mental Status: She is alert.      Assessment/Plan: Please see individual problem list.  Night sweats Likely postmenopausal sweats though given duration and extensive issues with the sweats will rule out underlying causes with blood cultures, QuantiFERON gold, and an HIV screening test.  TSH and CBC recently checked.  Left foot pain Undetermined cause.  Refer to podiatry.  Breast lesion She will see general surgery next week.  Abdominal pain Plan for CT scan abdomen and pelvis to evaluate further.  After the patient left I noted she had a contrast allergy.  I will have the CMA contact the patient to determine if she has received contrast with pretreatment after developing the contrast allergy.  Once we confirm this we  can plan on ordering CT abdomen pelvis either with or without contrast.  Colon cancer screening She is going to have a colonoscopy in the near future.  I will forward this note to the GI physician as she has reported some rectal bleeding.   Orders Placed This Encounter  Procedures  . Culture, blood (routine x 2)    Please collect from 2 different sites  . Flu Vaccine QUAD 36+ mos IM  . QuantiFERON-TB Gold Plus  . HIV antibody (with reflex)  . Ambulatory referral to Podiatry    Referral Priority:   Routine    Referral Type:   Consultation    Referral Reason:   Specialty Services Required    Requested Specialty:   Podiatry    Number of Visits Requested:   1    Meds ordered this encounter  Medications  .  levothyroxine (SYNTHROID) 175 MCG tablet    Sig: Take 1 tablet (175 mcg total) by mouth daily before breakfast.    Dispense:  90 tablet    Refill:  [place], MD [place]

## 2018-11-06 NOTE — Assessment & Plan Note (Signed)
Likely postmenopausal sweats though given duration and extensive issues with the sweats will rule out underlying causes with blood cultures, QuantiFERON gold, and an HIV screening test.  TSH and CBC recently checked.

## 2018-11-06 NOTE — Telephone Encounter (Signed)
Please call the patient.  I tried to order her CT scan and noted she had a contrast allergy.  Please confirm what her allergy to contrast is.  Please see if she has received contrast with pretreatment since she was found to have a contrast allergy.  Thanks.

## 2018-11-06 NOTE — Assessment & Plan Note (Signed)
Undetermined cause.  Refer to podiatry.

## 2018-11-07 LAB — HIV ANTIBODY (ROUTINE TESTING W REFLEX): HIV Screen 4th Generation wRfx: NONREACTIVE

## 2018-11-07 LAB — BLOOD CULTURE ID PANEL (REFLEXED)

## 2018-11-08 NOTE — Telephone Encounter (Signed)
States that in 1988 she had a scan done to check her kidneys and was given a contrast with iodine and she states that she swelled up like a balloon. States that she has not had a procedure done since then that involved the need for contrast. Willing to do pretreatment if needed. Cornelius Marullo,cma

## 2018-11-08 NOTE — Telephone Encounter (Signed)
I called and informed the patient the the provider ordered a Ct scan without contrast and they will call her to schedule, patient understood.  Savon Cobbs,cma

## 2018-11-08 NOTE — Telephone Encounter (Signed)
I have ordered a CT without IV contrast. I will forward to Melissa to get scheduled and find out if the patient will be able to have the CT with oral contrast given her contrast allergy. Thanks.

## 2018-11-09 LAB — CULTURE, BLOOD (ROUTINE X 2): Special Requests: ADEQUATE

## 2018-11-09 LAB — QUANTIFERON-TB GOLD PLUS: QuantiFERON-TB Gold Plus: NEGATIVE

## 2018-11-09 LAB — QUANTIFERON-TB GOLD PLUS (RQFGPL)
QuantiFERON Mitogen Value: >10 IU/mL
QuantiFERON Nil Value: 0.06 IU/mL
QuantiFERON TB1 Ag Value: 0.06 IU/mL
QuantiFERON TB2 Ag Value: 0.05 IU/mL

## 2018-11-11 LAB — CULTURE, BLOOD (ROUTINE X 2): Culture: NO GROWTH

## 2018-11-13 ENCOUNTER — Other Ambulatory Visit: Payer: 59

## 2018-11-15 ENCOUNTER — Telehealth: Payer: Self-pay | Admitting: Family Medicine

## 2018-11-15 ENCOUNTER — Other Ambulatory Visit
Admission: RE | Admit: 2018-11-15 | Discharge: 2018-11-15 | Disposition: A | Discharge status: Home / Self Care | Payer: 59 | Source: Ambulatory Visit | Attending: Gastroenterology | Admitting: Gastroenterology

## 2018-11-15 DIAGNOSIS — Z01812 Encounter for preprocedural laboratory examination: Secondary | ICD-10-CM | POA: Diagnosis present

## 2018-11-15 DIAGNOSIS — Z20828 Contact with and (suspected) exposure to other viral communicable diseases: Secondary | ICD-10-CM | POA: Diagnosis not present

## 2018-11-15 DIAGNOSIS — R2232 Localized swelling, mass and lump, left upper limb: Secondary | ICD-10-CM

## 2018-11-15 LAB — SARS CORONAVIRUS 2 (TAT 6-24 HRS): SARS Coronavirus 2: NEGATIVE

## 2018-11-15 NOTE — Telephone Encounter (Signed)
Ordered

## 2018-11-15 NOTE — Telephone Encounter (Signed)
Called pt regarding her CT that has been scheduled. She states that she went to see Dr. Marlou Starks regarding the nodule in her left breast that he will be removing. She states that he felt around and also felt an enlarged lymph node under her arm (armpit area) which they did not ultrasound.  They just ultra sounded the targeted area of concern. She states that Dr. Marlou Starks said that this needs to be imaged as well. She wants to know what she needs to do to get this taken care of asap.

## 2018-11-19 ENCOUNTER — Encounter: Payer: Self-pay | Admitting: Anesthesiology

## 2018-11-19 ENCOUNTER — Ambulatory Visit: Payer: 59 | Admitting: Anesthesiology

## 2018-11-19 ENCOUNTER — Ambulatory Visit
Admission: RE | Admit: 2018-11-19 | Discharge: 2018-11-19 | Disposition: A | Discharge status: Home / Self Care | Payer: 59 | Attending: Gastroenterology | Admitting: Gastroenterology

## 2018-11-19 ENCOUNTER — Encounter
Admission: RE | Disposition: A | Discharge status: Home / Self Care | Payer: Self-pay | Source: Home / Self Care | Attending: Gastroenterology

## 2018-11-19 ENCOUNTER — Other Ambulatory Visit: Payer: Self-pay

## 2018-11-19 DIAGNOSIS — F419 Anxiety disorder, unspecified: Secondary | ICD-10-CM | POA: Insufficient documentation

## 2018-11-19 DIAGNOSIS — Z1211 Encounter for screening for malignant neoplasm of colon: Secondary | ICD-10-CM | POA: Diagnosis present

## 2018-11-19 DIAGNOSIS — F329 Major depressive disorder, single episode, unspecified: Secondary | ICD-10-CM | POA: Diagnosis not present

## 2018-11-19 DIAGNOSIS — I10 Essential (primary) hypertension: Secondary | ICD-10-CM | POA: Diagnosis not present

## 2018-11-19 DIAGNOSIS — E039 Hypothyroidism, unspecified: Secondary | ICD-10-CM | POA: Insufficient documentation

## 2018-11-19 DIAGNOSIS — Z9049 Acquired absence of other specified parts of digestive tract: Secondary | ICD-10-CM | POA: Insufficient documentation

## 2018-11-19 DIAGNOSIS — K219 Gastro-esophageal reflux disease without esophagitis: Secondary | ICD-10-CM | POA: Insufficient documentation

## 2018-11-19 DIAGNOSIS — Z79899 Other long term (current) drug therapy: Secondary | ICD-10-CM | POA: Insufficient documentation

## 2018-11-19 DIAGNOSIS — Z79890 Hormone replacement therapy (postmenopausal): Secondary | ICD-10-CM | POA: Diagnosis not present

## 2018-11-19 DIAGNOSIS — Z923 Personal history of irradiation: Secondary | ICD-10-CM | POA: Insufficient documentation

## 2018-11-19 DIAGNOSIS — G4730 Sleep apnea, unspecified: Secondary | ICD-10-CM | POA: Insufficient documentation

## 2018-11-19 DIAGNOSIS — Z980 Intestinal bypass and anastomosis status: Secondary | ICD-10-CM | POA: Insufficient documentation

## 2018-11-19 DIAGNOSIS — K640 First degree hemorrhoids: Secondary | ICD-10-CM | POA: Diagnosis not present

## 2018-11-19 DIAGNOSIS — Z853 Personal history of malignant neoplasm of breast: Secondary | ICD-10-CM | POA: Diagnosis not present

## 2018-11-19 DIAGNOSIS — E785 Hyperlipidemia, unspecified: Secondary | ICD-10-CM | POA: Insufficient documentation

## 2018-11-19 DIAGNOSIS — Z85850 Personal history of malignant neoplasm of thyroid: Secondary | ICD-10-CM | POA: Insufficient documentation

## 2018-11-19 DIAGNOSIS — Z8249 Family history of ischemic heart disease and other diseases of the circulatory system: Secondary | ICD-10-CM | POA: Diagnosis not present

## 2018-11-19 DIAGNOSIS — M8588 Other specified disorders of bone density and structure, other site: Secondary | ICD-10-CM | POA: Diagnosis not present

## 2018-11-19 DIAGNOSIS — Z791 Long term (current) use of non-steroidal anti-inflammatories (NSAID): Secondary | ICD-10-CM | POA: Insufficient documentation

## 2018-11-19 HISTORY — PX: COLONOSCOPY WITH PROPOFOL: SHX5780

## 2018-11-19 SURGERY — COLONOSCOPY WITH PROPOFOL
Anesthesia: General

## 2018-11-19 MED ORDER — PROPOFOL 500 MG/50ML IV EMUL
INTRAVENOUS | Status: DC | PRN
  Administered 2018-11-19: 140 ug/kg/min via INTRAVENOUS

## 2018-11-19 MED ORDER — PROPOFOL 10 MG/ML IV BOLUS
INTRAVENOUS | Status: DC | PRN
  Administered 2018-11-19: 70 mg via INTRAVENOUS

## 2018-11-19 MED ORDER — SODIUM CHLORIDE 0.9 % IV SOLN
INTRAVENOUS | Status: DC
  Administered 2018-11-19: 09:00:00 via INTRAVENOUS

## 2018-11-19 NOTE — H&P (Signed)
Christie Lame, MD [facility] [address] [place], [place] [address] Phone: [phone] Fax : [phone]  Primary Care Physician:  Leone Haven, MD Primary Gastroenterologist:  Dr. Allen Norris  Pre-Procedure History & Physical: HPI:  Christie Ramirez is a 63 y.o. female is here for a screening colonoscopy.   Past Medical History:  Diagnosis Date  . Anxiety   . 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   . Cancer ([place]) 1995   thyroid  . Endometriosis 2009  . Fibula fracture 11/11/2015  . Hyperlipidemia   . Hypothyroidism   . Osteopenia    rt hip  . Personal history of radiation therapy 2015   mammosite    Past Surgical History:  Procedure Laterality Date  . ABDOMINAL HYSTERECTOMY  2005   Martin DeFrancesco,MD  . BREAST BIOPSY Right 04-02-13   INVASIVE MAMMARY CARCINOMA WITH FEATURES OF TUBULAR CARCINOMA,  . BREAST LUMPECTOMY Right 04/2013   INVASIVE MAMMARY CARCINOMA WITH FEATURES OF TUBULAR CARCINOMA,  . BREAST SURGERY Right 05/01/13   wide excision  . colectomy  2009   secondary to endometriosis DR. Smith  . 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 No malignancy.  . COLONOSCOPY  2009   Dr. Tamala Julian  . FOOT SURGERY  right    Prior to Admission medications   Medication Sig Start Date End Date Taking? Authorizing Provider  albuterol (PROVENTIL HFA;VENTOLIN HFA) 108 (90 Base) MCG/ACT inhaler Inhale 2 puffs into the lungs every 6 (six) hours as needed for wheezing or shortness of breath. 10/13/16  Yes Pleas Koch, NP  amLODipine (NORVASC) 5 MG tablet  03/11/17  Yes [provider]  aspirin 81 MG tablet Take 81 mg by mouth daily.   Yes [provider]  brompheniramine-pseudoephedrine-DM 30-2-10 MG/5ML syrup Take 2.5 mLs by mouth at bedtime as needed. 03/13/17  Yes Burnard Hawthorne, FNP   CALCIUM-MAGNESIUM-ZINC PO Take by mouth 3 (three) times daily.   Yes [provider]  Cholecalciferol (VITAMIN D-3 PO) Take 1,000 Units by mouth.   Yes [provider]  escitalopram (LEXAPRO) 10 MG tablet TAKE 1 TABLET BY MOUTH EVERY DAY 12/11/17  Yes Leone Haven, MD  ezetimibe (ZETIA) 10 MG tablet Take 1 tablet (10 mg total) by mouth daily. 10/22/18  Yes Leone Haven, MD  levothyroxine (SYNTHROID) 175 MCG tablet Take 1 tablet (175 mcg total) by mouth daily before breakfast. 11/06/18  Yes Leone Haven, MD  losartan (COZAAR) 25 MG tablet Take 4 tablets (100 mg total) by mouth daily. 10/22/18  Yes Leone Haven, MD  meloxicam (MOBIC) 7.5 MG tablet meloxicam 7.5 mg tablet  TAKE 1 TABLET BY MOUTH TWICE A DAY   Yes [provider]  omeprazole (PRILOSEC) 20 MG capsule TAKE 1 CAPSULE BY MOUTH EVERY DAY 11/04/18  Yes Leone Haven, MD  raloxifene (EVISTA) 60 MG tablet Take 60 mg by mouth daily. 01/30/17  Yes [provider]  traZODone (DESYREL) 50 MG tablet Take 2 tablets (100 mg total) by mouth at bedtime as needed for sleep. 09/03/18  Yes Leone Haven, MD  vitamin C (ASCORBIC ACID) 500 MG tablet Take 500 mg by mouth daily.   Yes [provider]  atorvastatin (LIPITOR) 40 MG tablet TAKE 1 TABLET (40 MG TOTAL) BY MOUTH DAILY. Patient not taking: Reported on 11/19/2018 07/24/17  Crecencio Mc, MD  clonazePAM (KLONOPIN) 1 MG tablet Take 1 tablet (1 mg total) by mouth daily as needed. Patient not taking: Reported on 11/19/2018 10/01/17   Burnard Hawthorne, FNP  GARLIC PO Take 0000000 mg by mouth.    [provider]  Multiple Vitamins-Minerals (MULTIVITAMIN WITH MINERALS) tablet Take 1 tablet by mouth daily.    [provider]    Allergies as of 09/25/2018 - Review Complete 09/04/2018  Allergen Reaction Noted  . Codeine Anaphylaxis 10/12/2010  . Iodinated diagnostic agents Hives 10/12/2010  . Penicillins Rash  10/12/2010    Family History  Problem Relation Age of Onset  . Colon polyps Father        age 95  . Heart disease Father   . Heart disease Mother   . Heart disease Paternal Grandmother   . Heart disease Paternal Grandfather   . Cancer Neg Hx   . Diabetes Neg Hx   . Breast cancer Neg Hx     Social History   Socioeconomic History  . Marital status: Married    Spouse name: Not on file  . Number of children: Not on file  . Years of education: Not on file  . Highest education level: Not on file  Occupational History  . Not on file  Social Needs  . Financial resource strain: Not on file  . Food insecurity    Worry: Not on file    Inability: Not on file  . Transportation needs    Medical: Not on file    Non-medical: Not on file  Tobacco Use  . Smoking status: Never Smoker  . Smokeless tobacco: Never Used  Substance and Sexual Activity  . Alcohol use: Not Currently    Alcohol/week: 0.0 standard drinks  . Drug use: No  . Sexual activity: Yes    Birth control/protection: Surgical  Lifestyle  . Physical activity    Days per week: Not on file    Minutes per session: Not on file  . Stress: Not on file  Relationships  . Social Herbalist on phone: Not on file    Gets together: Not on file    Attends religious service: Not on file    Active member of club or organization: Not on file    Attends meetings of clubs or organizations: Not on file    Relationship status: Not on file  . Intimate partner violence    Fear of current or ex partner: Not on file    Emotionally abused: Not on file    Physically abused: Not on file    Forced sexual activity: Not on file  Other Topics Concern  . Not on file  Social History Narrative  . Not on file    Review of Systems: See HPI, otherwise negative ROS  Physical Exam: BP (!) 142/90   Pulse 100   Temp (!) 96.9 F (36.1 C) (Tympanic)   Resp (!) 22   Ht 5\' 1"  (1.549 m)   Wt 75.3 kg   SpO2 96%   BMI 31.37 kg/m   General:   Alert,  pleasant and cooperative in NAD Head:  Normocephalic and atraumatic. Neck:  Supple; no masses or thyromegaly. Lungs:  Clear throughout to auscultation.    Heart:  Regular rate and rhythm. Abdomen:  Soft, nontender and nondistended. Normal bowel sounds, without guarding, and without rebound.   Neurologic:  Alert and  oriented x4;  grossly normal neurologically.  Impression/Plan: Christie Ramirez  is now here to undergo a screening colonoscopy.  Risks, benefits, and alternatives regarding colonoscopy have been reviewed with the patient.  Questions have been answered.  All parties agreeable.

## 2018-11-19 NOTE — Anesthesia Postprocedure Evaluation (Signed)
Anesthesia Post Note  Patient: Christie Ramirez  Procedure(s) Performed: COLONOSCOPY WITH PROPOFOL (N/A )  Patient location during evaluation: Endoscopy Anesthesia Type: General Level of consciousness: awake and alert and oriented Pain management: pain level controlled Vital Signs Assessment: post-procedure vital signs reviewed and stable Respiratory status: spontaneous breathing Cardiovascular status: blood pressure returned to baseline Anesthetic complications: no     Last Vitals:  Vitals:   11/19/18 0955 11/19/18 1005  BP: 112/78 111/72  Pulse: 86 83  Resp: 13 14  Temp:    SpO2: 96% 96%    Last Pain:  Vitals:   11/19/18 1005  TempSrc:   PainSc: 0-No pain                 Ellionna Buckbee

## 2018-11-19 NOTE — Op Note (Signed)
[facility] Gastroenterology Patient Name: Christie Ramirez Procedure Date: 11/19/2018 9:21 AM MRN: [id number] Account #: [id number] Date of Birth: [birth year] Admit Type: Outpatient Age: 63 Room: [facility] ENDO ROOM 4 Gender: Female Note Status: Finalized Procedure:            Colonoscopy Indications:          Screening for colorectal malignant neoplasm Providers:            Lucilla Lame MD, MD Referring MD:         Angela Adam. Caryl Bis (Referring MD) Medicines:            Propofol per Anesthesia Complications:        No immediate complications. Procedure:            Pre-Anesthesia Assessment:                       - Prior to the procedure, a History and Physical was                        performed, and patient medications and allergies were                        reviewed. The patient's tolerance of previous                        anesthesia was also reviewed. The risks and benefits of                        the procedure and the sedation options and risks were                        discussed with the patient. All questions were                        answered, and informed consent was obtained. Prior                        Anticoagulants: The patient has taken no previous                        anticoagulant or antiplatelet agents. ASA Grade                        Assessment: II - A patient with mild systemic disease.                        After reviewing the risks and benefits, the patient was                        deemed in satisfactory condition to undergo the                        procedure.                       After obtaining informed consent, the colonoscope was                        passed under direct vision. Throughout the procedure,  the patient's blood pressure, pulse, and oxygen                        saturations were monitored continuously. The                        Colonoscope was introduced through the anus and              advanced to the the ileocolonic anastomosis. The                        colonoscopy was performed without difficulty. The                        patient tolerated the procedure well. The quality of                        the bowel preparation was excellent. Findings:      The perianal and digital rectal examinations were normal.      There was evidence of a prior end-to-end ileo-colonic anastomosis in the       transverse colon. This was patent and was characterized by healthy       appearing mucosa. The anastomosis was not traversed.      Non-bleeding internal hemorrhoids were found during retroflexion. The       hemorrhoids were Grade I (internal hemorrhoids that do not prolapse). Impression:           - Patent end-to-end ileo-colonic anastomosis,                        characterized by healthy appearing mucosa.                       - Non-bleeding internal hemorrhoids.                       - No specimens collected. Recommendation:       - Discharge patient to home.                       - Resume previous diet.                       - Continue present medications.                       - Repeat colonoscopy in 10 years for screening unless                        any change in family history or lower GI problems. Procedure Code(s):    --- Professional ---                       [id number], Colonoscopy, flexible; diagnostic, including                        collection of specimen(s) by brushing or washing, when                        performed (separate procedure) Diagnosis Code(s):    --- Professional ---  Z12.11, Encounter for screening for malignant neoplasm                        of colon CPT copyright 2019 American Medical Association. All rights reserved. The codes documented in this report are preliminary and upon coder review may  be revised to meet current compliance requirements. Lucilla Lame MD, MD 11/19/2018 9:44:51 AM This report has been signed  electronically. Number of Addenda: 0 Note Initiated On: 11/19/2018 9:21 AM Scope Withdrawal Time: 0 hours 6 minutes 12 seconds  Total Procedure Duration: 0 hours 10 minutes 44 seconds  Estimated Blood Loss: Estimated blood loss: none.      [facility]

## 2018-11-19 NOTE — Transfer of Care (Signed)
Immediate Anesthesia Transfer of Care Note  Patient: Christie Ramirez  Procedure(s) Performed: COLONOSCOPY WITH PROPOFOL (N/A )  Patient Location: PACU  Anesthesia Type:General  Level of Consciousness: sedated  Airway & Oxygen Therapy: Patient Spontanous Breathing  Post-op Assessment: Report given to RN and Post -op Vital signs reviewed and stable  Post vital signs: Reviewed and stable  Last Vitals:  Vitals Value Taken Time  BP 105/67 11/19/18 0946  Temp 36.5 C 11/19/18 0946  Pulse 88 11/19/18 0946  Resp 21 11/19/18 0946  SpO2 94 % 11/19/18 0946  Vitals shown include unvalidated device data.  Last Pain:  Vitals:   11/19/18 0946  TempSrc:   PainSc: 0-No pain         Complications: No apparent anesthesia complications

## 2018-11-19 NOTE — Anesthesia Preprocedure Evaluation (Signed)
Anesthesia Evaluation  Patient identified by MRN, date of birth, ID band Patient awake    Reviewed: Allergy & Precautions, NPO status , Patient's Chart, lab work & pertinent test results  Airway Mallampati: III       Dental   Pulmonary sleep apnea ,    Pulmonary exam normal        Cardiovascular hypertension, Normal cardiovascular exam     Neuro/Psych PSYCHIATRIC DISORDERS Anxiety Depression  Neuromuscular disease    GI/Hepatic Neg liver ROS, GERD  ,  Endo/Other  Hypothyroidism   Renal/GU negative Renal ROS  negative genitourinary   Musculoskeletal   Abdominal Normal abdominal exam  (+)   Peds negative pediatric ROS (+)  Hematology negative hematology ROS (+)   Anesthesia Other Findings Past Medical History: No date: Anxiety 05/01/2013: Breast cancer of upper-inner quadrant of right female  breast ([place])     Comment:  Tubular carcinoma, T1b,N0,M0; ER/ PR 90%, her 2 neu not               overexpressed.  1995: Cancer [facility])     Comment:  thyroid 2009: Endometriosis 11/11/2015: Fibula fracture No date: Hyperlipidemia No date: Hypothyroidism No date: Osteopenia     Comment:  rt hip 2015: Personal history of radiation therapy     Comment:  mammosite  Reproductive/Obstetrics                             Anesthesia Physical Anesthesia Plan  ASA: II  Anesthesia Plan: General   Post-op Pain Management:    Induction: Intravenous  PONV Risk Score and Plan: TIVA  Airway Management Planned: Nasal Cannula  Additional Equipment:   Intra-op Plan:   Post-operative Plan:   Informed Consent: I have reviewed the patients History and Physical, chart, labs and discussed the procedure including the risks, benefits and alternatives for the proposed anesthesia with the patient or authorized representative who has indicated his/her understanding and acceptance.     Dental advisory  given  Plan Discussed with: CRNA and Surgeon  Anesthesia Plan Comments:         Anesthesia Quick Evaluation

## 2018-11-19 NOTE — Anesthesia Post-op Follow-up Note (Signed)
Anesthesia QCDR form completed.        

## 2018-11-19 NOTE — OR Nursing (Signed)
Extent Reached at [phone]; post surgical anatomical changes.

## 2018-11-20 ENCOUNTER — Encounter: Payer: Self-pay | Admitting: Gastroenterology

## 2018-11-20 ENCOUNTER — Other Ambulatory Visit: Payer: Self-pay

## 2018-11-21 ENCOUNTER — Telehealth: Payer: Self-pay

## 2018-11-21 ENCOUNTER — Ambulatory Visit
Admission: RE | Admit: 2018-11-21 | Discharge: 2018-11-21 | Disposition: A | Discharge status: Home / Self Care | Payer: 59 | Source: Ambulatory Visit | Attending: Family Medicine | Admitting: Family Medicine

## 2018-11-21 DIAGNOSIS — R2232 Localized swelling, mass and lump, left upper limb: Secondary | ICD-10-CM | POA: Insufficient documentation

## 2018-11-21 NOTE — Telephone Encounter (Signed)
I routed the U/A results to Autumn Messing per Dr. Tharon Aquas request.  Lesia Hausen

## 2018-11-21 NOTE — Telephone Encounter (Signed)
-----   Message from Leone Haven, MD sent at 11/21/2018  1:39 PM EDT ----- Please print this and fax to Dr Autumn Messing so he is aware of the results. Thanks.

## 2018-11-22 ENCOUNTER — Other Ambulatory Visit: Payer: Self-pay

## 2018-11-22 ENCOUNTER — Emergency Department
Admission: EM | Admit: 2018-11-22 | Discharge: 2018-11-23 | Disposition: A | Discharge status: Home / Self Care | Payer: 59 | Attending: Emergency Medicine | Admitting: Emergency Medicine

## 2018-11-22 ENCOUNTER — Ambulatory Visit: Payer: Self-pay | Admitting: General Surgery

## 2018-11-22 ENCOUNTER — Encounter: Payer: Self-pay | Admitting: Emergency Medicine

## 2018-11-22 ENCOUNTER — Emergency Department: Payer: 59

## 2018-11-22 ENCOUNTER — Other Ambulatory Visit (INDEPENDENT_AMBULATORY_CARE_PROVIDER_SITE_OTHER): Payer: 59

## 2018-11-22 DIAGNOSIS — Y929 Unspecified place or not applicable: Secondary | ICD-10-CM | POA: Insufficient documentation

## 2018-11-22 DIAGNOSIS — E890 Postprocedural hypothyroidism: Secondary | ICD-10-CM

## 2018-11-22 DIAGNOSIS — R0789 Other chest pain: Secondary | ICD-10-CM | POA: Insufficient documentation

## 2018-11-22 DIAGNOSIS — E039 Hypothyroidism, unspecified: Secondary | ICD-10-CM | POA: Insufficient documentation

## 2018-11-22 DIAGNOSIS — Z853 Personal history of malignant neoplasm of breast: Secondary | ICD-10-CM | POA: Diagnosis not present

## 2018-11-22 DIAGNOSIS — Y9389 Activity, other specified: Secondary | ICD-10-CM | POA: Insufficient documentation

## 2018-11-22 DIAGNOSIS — T17320A Food in larynx causing asphyxiation, initial encounter: Secondary | ICD-10-CM | POA: Diagnosis not present

## 2018-11-22 DIAGNOSIS — X58XXXA Exposure to other specified factors, initial encounter: Secondary | ICD-10-CM | POA: Diagnosis not present

## 2018-11-22 DIAGNOSIS — I10 Essential (primary) hypertension: Secondary | ICD-10-CM | POA: Insufficient documentation

## 2018-11-22 DIAGNOSIS — Z79899 Other long term (current) drug therapy: Secondary | ICD-10-CM | POA: Insufficient documentation

## 2018-11-22 DIAGNOSIS — Y999 Unspecified external cause status: Secondary | ICD-10-CM | POA: Diagnosis not present

## 2018-11-22 DIAGNOSIS — T17308A Unspecified foreign body in larynx causing other injury, initial encounter: Secondary | ICD-10-CM

## 2018-11-22 DIAGNOSIS — Z7982 Long term (current) use of aspirin: Secondary | ICD-10-CM | POA: Diagnosis not present

## 2018-11-22 DIAGNOSIS — Z85850 Personal history of malignant neoplasm of thyroid: Secondary | ICD-10-CM | POA: Diagnosis not present

## 2018-11-22 LAB — TSH: TSH: 1.52 u[IU]/mL (ref 0.35–4.50)
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
## 2018-11-23 ENCOUNTER — Other Ambulatory Visit: Payer: Self-pay

## 2018-11-23 LAB — COMPREHENSIVE METABOLIC PANEL
ALT: 28 U/L (ref 0–44)
AST: 16 U/L (ref 15–41)
Albumin: 4.1 g/dL (ref 3.5–5.0)
Alkaline Phosphatase: 97 U/L (ref 38–126)
Anion gap: 9 (ref 5–15)
BUN: 15 mg/dL (ref 8–23)
CO2: 25 mmol/L (ref 22–32)
Calcium: 9.3 mg/dL (ref 8.9–10.3)
Chloride: 108 mmol/L (ref 98–111)
Creatinine, Ser: 0.65 mg/dL (ref 0.44–1.00)
GFR calc Af Amer: >60 mL/min (ref >60)
GFR calc non Af Amer: >60 mL/min (ref >60)
Glucose, Bld: 107 mg/dL — ABNORMAL HIGH (ref 70–99)
Potassium: 4 mmol/L (ref 3.5–5.1)
Sodium: 142 mmol/L (ref 135–145)
Total Bilirubin: 0.6 mg/dL (ref 0.3–1.2)
Total Protein: 7.5 g/dL (ref 6.5–8.1)

## 2018-11-23 LAB — CBC WITH DIFFERENTIAL/PLATELET
Abs Immature Granulocytes: 0.04 10*3/uL (ref 0.00–0.07)
Basophils Absolute: 0.1 10*3/uL (ref 0.0–0.1)
Basophils Relative: 1 %
Eosinophils Absolute: 0.2 10*3/uL (ref 0.0–0.5)
Eosinophils Relative: 2 %
HCT: 38.8 % (ref 36.0–46.0)
Hemoglobin: 12.6 g/dL (ref 12.0–15.0)
Immature Granulocytes: 0 %
Lymphocytes Relative: 31 %
Lymphs Abs: 3.3 10*3/uL (ref 0.7–4.0)
MCH: 28.1 pg (ref 26.0–34.0)
MCHC: 32.5 g/dL (ref 30.0–36.0)
MCV: 86.4 fL (ref 80.0–100.0)
Monocytes Absolute: 0.7 10*3/uL (ref 0.1–1.0)
Monocytes Relative: 7 %
Neutro Abs: 6.2 10*3/uL (ref 1.7–7.7)
Neutrophils Relative %: 59 %
Platelets: 362 10*3/uL (ref 150–400)
RBC: 4.49 MIL/uL (ref 3.87–5.11)
RDW: 13.3 % (ref 11.5–15.5)
WBC: 10.4 10*3/uL (ref 4.0–10.5)
nRBC: 0 % (ref 0.0–0.2)

## 2018-11-23 LAB — TROPONIN I (HIGH SENSITIVITY): Troponin I (High Sensitivity): <2 ng/L (ref <18)

## 2018-11-23 NOTE — Discharge Instructions (Addendum)
Return to the ED for worsening symptoms, persistent vomiting, difficulty breathing or other concerns.

## 2018-11-23 NOTE — ED Provider Notes (Signed)
[facility] Emergency Department Provider Note   ____________________________________________   None    (approximate)  I have reviewed the triage vital signs and the nursing notes.   HISTORY  Chief Complaint Shortness of Breath (post choking episode )    HPI Christie Ramirez is a 63 y.o. female who presents to the ED via EMS from home status post choking episode.  Patient was eating dinner and choked on a green bean.  Husband tried to do the Heimlich maneuver but was unsuccessful.  Patient managed to dial 911 before she passed out.  Fire department was able to perform a chest thrust and dislodge the green bean.  Patient arrives to the ED voicing no complaints.  No chest pain, shortness of breath, difficulty swallowing, abdominal pain, nausea or vomiting.  Concerned about aspiration pneumonia.       Past Medical History:  Diagnosis Date  . Anxiety   . 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   . Cancer ([place]) 1995   thyroid  . Endometriosis 2009  . Fibula fracture 11/11/2015  . Hyperlipidemia   . Hypothyroidism   . Osteopenia    rt hip  . Personal history of radiation therapy 2015   mammosite    Patient Active Problem List   Diagnosis Date Noted  . Encounter for screening colonoscopy   . Left foot pain 11/06/2018  . Breast lesion 10/19/2018  . History of thyroid cancer 10/19/2018  . Abdominal pain 10/19/2018  . History of breast cancer 10/19/2018  . Hoarseness 10/19/2018  . Left shoulder pain 08/25/2018  . Pain of left calf 08/25/2018  . Carotid artery plaque 03/05/2017  . Encounter for general adult medical examination with abnormal findings 02/03/2017  . Palpitations 02/03/2017  . Rectal bleeding 02/03/2017  . Night sweats 02/03/2017  . Carpal tunnel syndrome 02/03/2017  . GERD (gastroesophageal reflux disease) 02/03/2017  .  Neck pain 10/30/2016  . Acute pain of left knee 07/27/2016  . Acute pain of right shoulder 07/06/2016  . Fatigue 10/01/2015  . Right hip pain 08/11/2015  . Osteopenia 08/11/2015  . Chronic bronchitis ([place]) 01/29/2015  . History of herpes genitalis 09/23/2014  . Endometriosis 09/23/2014  . OSA (obstructive sleep apnea) 09/19/2013  . Colon cancer screening 07/28/2013  . Insomnia 06/09/2013  . Breast cancer of upper-inner quadrant of right female breast ([place]) 04/08/2013  . Hyperlipidemia with target low density lipoprotein (LDL) cholesterol less than 100 mg/dL 06/26/2011  . Hypertension 05/18/2011  . Anxiety 05/18/2011  . Hypothyroidism 10/12/2010  . Depression 10/12/2010    Past Surgical History:  Procedure Laterality Date  . ABDOMINAL HYSTERECTOMY  2005   Martin DeFrancesco,MD  . BREAST BIOPSY Right 04-02-13   INVASIVE MAMMARY CARCINOMA WITH FEATURES OF TUBULAR CARCINOMA,  . BREAST LUMPECTOMY Right 04/2013   INVASIVE MAMMARY CARCINOMA WITH FEATURES OF TUBULAR CARCINOMA,  . BREAST SURGERY Right 05/01/13   wide excision  . colectomy  2009   secondary to endometriosis DR. Smith  . 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 No malignancy.  . COLONOSCOPY  2009   Dr. Tamala Julian  . COLONOSCOPY WITH PROPOFOL N/A 11/19/2018   Procedure: COLONOSCOPY WITH PROPOFOL;  Surgeon: Lucilla Lame, MD;  [facility] ENDOSCOPY;  Service: Endoscopy;  Laterality: N/A;  . FOOT SURGERY  right    Prior to Admission medications  Medication Sig Start Date End Date Taking? Authorizing Provider  albuterol (PROVENTIL HFA;VENTOLIN HFA) 108 (90 Base) MCG/ACT inhaler Inhale 2 puffs into the lungs every 6 (six) hours as needed for wheezing or shortness of breath. 10/13/16   Pleas Koch, NP  amLODipine (NORVASC) 5 MG tablet  03/11/17   [provider]  aspirin 81 MG tablet Take 81 mg by mouth daily.    [provider]  atorvastatin  (LIPITOR) 40 MG tablet TAKE 1 TABLET (40 MG TOTAL) BY MOUTH DAILY. Patient not taking: Reported on 11/19/2018 07/24/17   Crecencio Mc, MD  brompheniramine-pseudoephedrine-DM 30-2-10 MG/5ML syrup Take 2.5 mLs by mouth at bedtime as needed. 03/13/17   Burnard Hawthorne, FNP  CALCIUM-MAGNESIUM-ZINC PO Take by mouth 3 (three) times daily.    [provider]  Cholecalciferol (VITAMIN D-3 PO) Take 1,000 Units by mouth.    [provider]  clonazePAM (KLONOPIN) 1 MG tablet Take 1 tablet (1 mg total) by mouth daily as needed. Patient not taking: Reported on 11/19/2018 10/01/17   Burnard Hawthorne, FNP  escitalopram (LEXAPRO) 10 MG tablet TAKE 1 TABLET BY MOUTH EVERY DAY 12/11/17   Leone Haven, MD  ezetimibe (ZETIA) 10 MG tablet Take 1 tablet (10 mg total) by mouth daily. 10/22/18   Leone Haven, MD  GARLIC PO Take 0000000 mg by mouth.    [provider]  levothyroxine (SYNTHROID) 175 MCG tablet Take 1 tablet (175 mcg total) by mouth daily before breakfast. 11/06/18   Leone Haven, MD  losartan (COZAAR) 25 MG tablet Take 4 tablets (100 mg total) by mouth daily. 10/22/18   Leone Haven, MD  meloxicam (MOBIC) 7.5 MG tablet meloxicam 7.5 mg tablet  TAKE 1 TABLET BY MOUTH TWICE A DAY    [provider]  Multiple Vitamins-Minerals (MULTIVITAMIN WITH MINERALS) tablet Take 1 tablet by mouth daily.    [provider]  omeprazole (PRILOSEC) 20 MG capsule TAKE 1 CAPSULE BY MOUTH EVERY DAY 11/04/18   Leone Haven, MD  raloxifene (EVISTA) 60 MG tablet Take 60 mg by mouth daily. 01/30/17   [provider]  traZODone (DESYREL) 50 MG tablet Take 2 tablets (100 mg total) by mouth at bedtime as needed for sleep. 09/03/18   Leone Haven, MD  vitamin C (ASCORBIC ACID) 500 MG tablet Take 500 mg by mouth daily.    [provider]    Allergies Codeine, Iodinated diagnostic agents, and Penicillins  Family History  Problem Relation  Age of Onset  . Colon polyps Father        age 53  . Heart disease Father   . Heart disease Mother   . Heart disease Paternal Grandmother   . Heart disease Paternal Grandfather   . Cancer Neg Hx   . Diabetes Neg Hx   . Breast cancer Neg Hx     Social History Social History   Tobacco Use  . Smoking status: Never Smoker  . Smokeless tobacco: Never Used  Substance Use Topics  . Alcohol use: Not Currently    Alcohol/week: 0.0 standard drinks  . Drug use: No    Review of Systems  Constitutional: No fever/chills Eyes: No visual changes. ENT: No sore throat. Cardiovascular: Denies chest pain. Respiratory: Positive for choking.  Denies shortness of breath. Gastrointestinal: No abdominal pain.  No nausea, no vomiting.  No diarrhea.  No constipation. Genitourinary: Negative for dysuria. Musculoskeletal: Negative for back pain. Skin: Negative for rash.  Neurological: Negative for headaches, focal weakness or numbness.   ____________________________________________   PHYSICAL EXAM:  VITAL SIGNS: ED Triage Vitals  Enc Vitals Group     BP 11/22/18 2130 136/90     Pulse Rate 11/22/18 2130 98     Resp 11/22/18 2130 18     Temp 11/22/18 2130 98.2 F (36.8 C)     Temp Source 11/22/18 2130 Oral     SpO2 11/22/18 2130 96 %     Weight 11/22/18 2131 166 lb (75.3 kg)     Height 11/22/18 2131 5\' 1"  (1.549 m)     Head Circumference --      Peak Flow --      Pain Score 11/22/18 2131 7     Pain Loc --      Pain Edu? --      Excl. in [place]? --     Constitutional: Alert and oriented. Well appearing and in no acute distress. Eyes: Conjunctivae are normal. PERRL. EOMI. Head: Atraumatic. Nose: No congestion/rhinnorhea. Mouth/Throat: Mucous membranes are moist.  Oropharynx non-erythematous. Neck: No stridor.   Cardiovascular: Normal rate, regular rhythm. Grossly normal heart sounds.  Good peripheral circulation. Respiratory: Normal respiratory effort.  No retractions. Lungs CTAB.  No  external evidence of bruising to chest or abdomen. Gastrointestinal: Soft and nontender. No distention. No abdominal bruits. No CVA tenderness. Musculoskeletal: No lower extremity tenderness nor edema.  No joint effusions. Neurologic:  Normal speech and language. No gross focal neurologic deficits are appreciated. No gait instability. Skin:  Skin is warm, dry and intact. No rash noted. Psychiatric: Mood and affect are normal. Speech and behavior are normal.  ____________________________________________   LABS (all labs ordered are listed, but only abnormal results are displayed)  Labs Reviewed  COMPREHENSIVE METABOLIC PANEL - Abnormal; Notable for the following components:      Result Value   Glucose, Bld 107 (*)    All other components within normal limits  CBC WITH DIFFERENTIAL/PLATELET  TROPONIN I (HIGH SENSITIVITY)  TROPONIN I (HIGH SENSITIVITY)   ____________________________________________  EKG  ED ECG REPORT I, , J, the attending physician, personally viewed and interpreted this ECG.   Date: 11/23/2018  EKG Time: 0103  Rate: 88  Rhythm: normal EKG, normal sinus rhythm  Axis: Normal  Intervals:none  ST&T Change: Nonspecific  ____________________________________________  RADIOLOGY  ED MD interpretation: No acute cardiopulmonary process  Official radiology report(s): Dg Chest 2 View  Result Date: 11/22/2018 CLINICAL DATA:  Right-sided chest pain. Recently had Heimlich maneuver for choking. EXAM: CHEST - 2 VIEW COMPARISON:  10/13/2016 FINDINGS: The heart size and mediastinal contours are within normal limits. No evidence of pneumomediastinum. Aortic atherosclerosis. No evidence of pneumothorax or hemothorax. Both lungs are clear. The visualized skeletal structures are unremarkable. IMPRESSION: No active cardiopulmonary disease. Electronically Signed   By: Marlaine Hind M.D.   On: 11/22/2018 21:54    ____________________________________________    PROCEDURES  Procedure(s) performed (including Critical Care):  Procedures   ____________________________________________   INITIAL IMPRESSION / ASSESSMENT AND PLAN / ED COURSE  As part of my medical decision making, I reviewed the following data within the [place] notes reviewed and incorporated, Labs reviewed, EKG interpreted, Old chart reviewed, Radiograph reviewed and Notes from prior ED visits     Christie Ramirez was evaluated in Emergency Department on 11/23/2018 for the symptoms described in the history of present illness. She was evaluated in the context of the global COVID-19 pandemic, which necessitated consideration that  the patient might be at risk for infection with the SARS-CoV-2 virus that causes COVID-19. Institutional protocols and algorithms that pertain to the evaluation of patients at risk for COVID-19 are in a state of rapid change based on information released by regulatory bodies including the CDC and federal and state organizations. These policies and algorithms were followed during the patient's care in the ED.    63 year old female who presents to the ED status post choking episode with syncope.  Differential diagnosis includes but is not limited to aspiration, ACS, airway obstruction, etc.  Chest x-ray is unremarkable.  Given patient's age and syncopal episode, will obtain cardiac panel.  She is currently back to her baseline and voices no medical complaints.   Clinical Course as of Nov 22 444  Sat Nov 23, 2018  0151 Updated patient on all test results.  She is feeling fine, drinking water and laughing with her friend who is at bedside.  Strict return precautions given.  Patient verbalizes understanding and agrees with plan of care.   [JS]    Clinical Course User Index [JS] Paulette Blanch, MD     ____________________________________________   FINAL CLINICAL IMPRESSION(S) / ED DIAGNOSES  Final diagnoses:  Choking, initial  encounter     ED Discharge Orders    None       Note:  This document was prepared using Dragon voice recognition software and may include unintentional dictation errors.   Paulette Blanch, MD 11/23/18 [phone]

## 2018-11-25 ENCOUNTER — Encounter (HOSPITAL_BASED_OUTPATIENT_CLINIC_OR_DEPARTMENT_OTHER): Payer: Self-pay | Admitting: *Deleted

## 2018-11-25 ENCOUNTER — Other Ambulatory Visit: Payer: Self-pay

## 2018-11-25 ENCOUNTER — Ambulatory Visit
Admission: RE | Admit: 2018-11-25 | Discharge: 2018-11-25 | Disposition: A | Discharge status: Home / Self Care | Payer: 59 | Source: Ambulatory Visit | Attending: Family Medicine | Admitting: Family Medicine

## 2018-11-25 DIAGNOSIS — R109 Unspecified abdominal pain: Secondary | ICD-10-CM

## 2018-11-26 ENCOUNTER — Other Ambulatory Visit: Payer: Self-pay | Admitting: Podiatry

## 2018-11-26 ENCOUNTER — Encounter: Payer: Self-pay | Admitting: Podiatry

## 2018-11-26 ENCOUNTER — Telehealth: Payer: Self-pay | Admitting: *Deleted

## 2018-11-26 ENCOUNTER — Ambulatory Visit (INDEPENDENT_AMBULATORY_CARE_PROVIDER_SITE_OTHER): Payer: 59

## 2018-11-26 ENCOUNTER — Ambulatory Visit (INDEPENDENT_AMBULATORY_CARE_PROVIDER_SITE_OTHER): Payer: 59 | Admitting: Podiatry

## 2018-11-26 DIAGNOSIS — M722 Plantar fascial fibromatosis: Secondary | ICD-10-CM

## 2018-11-26 DIAGNOSIS — M5432 Sciatica, left side: Secondary | ICD-10-CM

## 2018-11-26 DIAGNOSIS — G629 Polyneuropathy, unspecified: Secondary | ICD-10-CM

## 2018-11-26 DIAGNOSIS — M779 Enthesopathy, unspecified: Secondary | ICD-10-CM

## 2018-11-26 DIAGNOSIS — R109 Unspecified abdominal pain: Secondary | ICD-10-CM

## 2018-11-26 DIAGNOSIS — M5431 Sciatica, right side: Secondary | ICD-10-CM

## 2018-11-26 DIAGNOSIS — G8929 Other chronic pain: Secondary | ICD-10-CM

## 2018-11-26 MED ORDER — GABAPENTIN 100 MG PO CAPS: 100.0000 mg | ORAL_CAPSULE | Freq: Every day | ORAL | 2 refills | Status: DC

## 2018-11-26 NOTE — Telephone Encounter (Signed)
Referral placed. It is possible that her pain could be from scar tissue.

## 2018-11-26 NOTE — Addendum Note (Signed)
Addended by: Leone Haven on: 11/26/2018 04:33 PM   Modules accepted: Orders

## 2018-11-26 NOTE — Telephone Encounter (Signed)
Pt called in and was given CT abd pelvis result from Dr. Caryl Bis dated 11/26/2018 at 9:51 AM.  She is fine with the GI consult.   She just very recently had her colonoscopy done by Dr. Lucilla Lame.   He would be fine to see per pt.   She wants to know if the pain could be from scar tissue from the removal of her ascending intestine?  I sent this message to Dr. Ellen Henri office.

## 2018-11-28 ENCOUNTER — Other Ambulatory Visit (HOSPITAL_COMMUNITY)
Admission: RE | Admit: 2018-11-28 | Discharge: 2018-11-28 | Disposition: A | Discharge status: Home / Self Care | Payer: 59 | Source: Ambulatory Visit | Attending: General Surgery | Admitting: General Surgery

## 2018-11-28 DIAGNOSIS — Z20828 Contact with and (suspected) exposure to other viral communicable diseases: Secondary | ICD-10-CM | POA: Insufficient documentation

## 2018-11-28 DIAGNOSIS — Z01812 Encounter for preprocedural laboratory examination: Secondary | ICD-10-CM | POA: Diagnosis not present

## 2018-11-29 NOTE — Progress Notes (Signed)
   Subjective: 63 y.o. female presenting today with a chief complaint of burning, sharp pain to the left foot and heel that began 6-8 months ago. She reprots a previous fracture of the foot five years ago. Wearing tennis shoes helps alleviate the pain. She has been taking Tramadol prescribed by her PCP for pain. There are no worsening factors noted. Patient is here for further evaluation and treatment.   Past Medical History:  Diagnosis Date  . Anxiety   . 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   . Cancer ([place]) 1995   thyroid  . Endometriosis 2009  . Fibula fracture 11/11/2015  . GERD (gastroesophageal reflux disease)   . Hyperlipidemia   . Hypertension   . Hypothyroidism   . Osteopenia    rt hip  . Personal history of radiation therapy 2015   mammosite  . Seasonal allergies   . Sleep apnea    does not wear CPAP     Objective: Physical Exam General: The patient is alert and oriented x3 in no acute distress.  Dermatology: Skin is warm, dry and supple bilateral lower extremities. Negative for open lesions or macerations bilateral.   Vascular: Dorsalis Pedis and Posterior Tibial pulses palpable bilateral.  Capillary fill time is immediate to all digits.  Neurological: Epicritic and protective threshold intact bilateral.   Musculoskeletal: Tenderness to palpation to the plantar aspect of the left heel along the plantar fascia. All other joints range of motion within normal limits bilateral. Strength 5/5 in all groups bilateral.   Radiographic exam: Normal osseous mineralization. Joint spaces preserved. No fracture/dislocation/boney destruction. No other soft tissue abnormalities or radiopaque foreign bodies.   Assessment: 1. Plantar fasciitis left foot 2. Sciatica  3. Peripheral polyneuropathy   Plan of Care:  1. Patient evaluated. Xrays reviewed.   2. Injection  of 0.5cc Celestone soluspan injected into the left plantar fascia.  3. Prescription for Gabapentin 100 mg QHS provided to patient.  4. Continue taking Tramadol as directed by PCP.  5. Return to clinic in 6 weeks.      Edrick Kins, DPM [facility]  Dr. Edrick Kins, DPM    [address],  [address]                Office [phone]  Fax [phone]

## 2018-11-30 LAB — NOVEL CORONAVIRUS, NAA (HOSP ORDER, SEND-OUT TO REF LAB; TAT 18-24 HRS): SARS-CoV-2, NAA: NOT DETECTED

## 2018-12-01 NOTE — Anesthesia Preprocedure Evaluation (Addendum)
Anesthesia Evaluation  Patient identified by MRN, date of birth, ID band Patient awake    Reviewed: Allergy & Precautions, NPO status , Patient's Chart, lab work & pertinent test results  History of Anesthesia Complications (+) DIFFICULT AIRWAY  Airway Mallampati: II  TM Distance: >3 FB Neck ROM: Full    Dental no notable dental hx. (+) Teeth Intact   Pulmonary sleep apnea ,    Pulmonary exam normal breath sounds clear to auscultation       Cardiovascular hypertension, Pt. on medications Normal cardiovascular exam Rhythm:Regular Rate:Normal     Neuro/Psych PSYCHIATRIC DISORDERS Anxiety  Neuromuscular disease    GI/Hepatic Neg liver ROS, GERD  ,  Endo/Other  negative endocrine ROSHypothyroidism   Renal/GU negative Renal ROSK+ 4.0 Cr 0.65     Musculoskeletal   Abdominal (+) + obese,   Peds  Hematology negative hematology ROS (+) Hgb 12.6 Plt 365   Anesthesia Other Findings Breast CA  Reproductive/Obstetrics                           Anesthesia Physical Anesthesia Plan  ASA: III  Anesthesia Plan: General   Post-op Pain Management:    Induction: Intravenous  PONV Risk Score and Plan: 3 and Treatment may vary due to age or medical condition  Airway Management Planned: LMA  Additional Equipment: None  Intra-op Plan:   Post-operative Plan:   Informed Consent: I have reviewed the patients History and Physical, chart, labs and discussed the procedure including the risks, benefits and alternatives for the proposed anesthesia with the patient or authorized representative who has indicated his/her understanding and acceptance.     Dental advisory given  Plan Discussed with:   Anesthesia Plan Comments:        Anesthesia Quick Evaluation

## 2018-12-02 ENCOUNTER — Ambulatory Visit (HOSPITAL_BASED_OUTPATIENT_CLINIC_OR_DEPARTMENT_OTHER): Payer: 59 | Admitting: Anesthesiology

## 2018-12-02 ENCOUNTER — Other Ambulatory Visit: Payer: Self-pay

## 2018-12-02 ENCOUNTER — Ambulatory Visit (HOSPITAL_BASED_OUTPATIENT_CLINIC_OR_DEPARTMENT_OTHER)
Admission: RE | Admit: 2018-12-02 | Discharge: 2018-12-02 | Disposition: A | Discharge status: Home / Self Care | Payer: 59 | Attending: General Surgery | Admitting: General Surgery

## 2018-12-02 ENCOUNTER — Encounter (HOSPITAL_BASED_OUTPATIENT_CLINIC_OR_DEPARTMENT_OTHER): Payer: Self-pay | Admitting: Certified Registered"

## 2018-12-02 ENCOUNTER — Encounter (HOSPITAL_BASED_OUTPATIENT_CLINIC_OR_DEPARTMENT_OTHER)
Admission: RE | Disposition: A | Discharge status: Home / Self Care | Payer: Self-pay | Source: Home / Self Care | Attending: General Surgery

## 2018-12-02 DIAGNOSIS — L720 Epidermal cyst: Secondary | ICD-10-CM | POA: Diagnosis not present

## 2018-12-02 DIAGNOSIS — Z79899 Other long term (current) drug therapy: Secondary | ICD-10-CM | POA: Diagnosis not present

## 2018-12-02 DIAGNOSIS — Z85850 Personal history of malignant neoplasm of thyroid: Secondary | ICD-10-CM | POA: Insufficient documentation

## 2018-12-02 DIAGNOSIS — F419 Anxiety disorder, unspecified: Secondary | ICD-10-CM | POA: Insufficient documentation

## 2018-12-02 DIAGNOSIS — G4730 Sleep apnea, unspecified: Secondary | ICD-10-CM | POA: Insufficient documentation

## 2018-12-02 DIAGNOSIS — I10 Essential (primary) hypertension: Secondary | ICD-10-CM | POA: Diagnosis not present

## 2018-12-02 DIAGNOSIS — Z79810 Long term (current) use of selective estrogen receptor modulators (SERMs): Secondary | ICD-10-CM | POA: Insufficient documentation

## 2018-12-02 DIAGNOSIS — C50911 Malignant neoplasm of unspecified site of right female breast: Secondary | ICD-10-CM | POA: Diagnosis not present

## 2018-12-02 DIAGNOSIS — E890 Postprocedural hypothyroidism: Secondary | ICD-10-CM | POA: Diagnosis not present

## 2018-12-02 DIAGNOSIS — Z79890 Hormone replacement therapy (postmenopausal): Secondary | ICD-10-CM | POA: Insufficient documentation

## 2018-12-02 DIAGNOSIS — N6320 Unspecified lump in the left breast, unspecified quadrant: Secondary | ICD-10-CM | POA: Diagnosis present

## 2018-12-02 HISTORY — DX: Gastro-esophageal reflux disease without esophagitis: K21.9

## 2018-12-02 HISTORY — PX: BREAST CYST EXCISION: SHX579

## 2018-12-02 HISTORY — DX: Essential (primary) hypertension: I10

## 2018-12-02 HISTORY — DX: Sleep apnea, unspecified: G47.30

## 2018-12-02 HISTORY — DX: Other seasonal allergic rhinitis: J30.2

## 2018-12-02 SURGERY — EXCISION, CYST, BREAST
Anesthesia: General | Site: Breast | Laterality: Left

## 2018-12-02 MED ORDER — LACTATED RINGERS IV SOLN
INTRAVENOUS | Status: DC
  Administered 2018-12-02: 08:00:00 via INTRAVENOUS

## 2018-12-02 MED ORDER — TRAMADOL HCL 50 MG PO TABS: 50.0000 mg | ORAL_TABLET | Freq: Four times a day (QID) | ORAL | 0 refills | Status: DC | PRN

## 2018-12-02 MED ORDER — BUPIVACAINE HCL (PF) 0.25 % IJ SOLN
INTRAMUSCULAR | Status: DC | PRN
  Administered 2018-12-02: 20 mL

## 2018-12-02 MED ORDER — VANCOMYCIN HCL IN DEXTROSE 1-5 GM/200ML-% IV SOLN
1000.0000 mg | INTRAVENOUS | Status: AC
  Administered 2018-12-02: 1000 mg via INTRAVENOUS

## 2018-12-02 MED ORDER — ONDANSETRON HCL 4 MG/2ML IJ SOLN: 4.0000 mg | Freq: Once | INTRAMUSCULAR | Status: DC | PRN

## 2018-12-02 MED ORDER — MIDAZOLAM HCL 2 MG/2ML IJ SOLN
INTRAMUSCULAR | Status: AC
  Filled 2018-12-02: qty 2

## 2018-12-02 MED ORDER — ONDANSETRON HCL 4 MG/2ML IJ SOLN
INTRAMUSCULAR | Status: DC | PRN
  Administered 2018-12-02: 4 mg via INTRAVENOUS

## 2018-12-02 MED ORDER — OXYCODONE HCL 5 MG PO TABS: 5.0000 mg | ORAL_TABLET | Freq: Once | ORAL | Status: DC | PRN

## 2018-12-02 MED ORDER — MEPERIDINE HCL 25 MG/ML IJ SOLN: 6.2500 mg | INTRAMUSCULAR | Status: DC | PRN

## 2018-12-02 MED ORDER — DEXAMETHASONE SODIUM PHOSPHATE 4 MG/ML IJ SOLN
INTRAMUSCULAR | Status: DC | PRN
  Administered 2018-12-02: 5 mg via INTRAVENOUS

## 2018-12-02 MED ORDER — CELECOXIB 200 MG PO CAPS
ORAL_CAPSULE | ORAL | Status: AC
  Filled 2018-12-02: qty 1

## 2018-12-02 MED ORDER — CHLORHEXIDINE GLUCONATE CLOTH 2 % EX PADS: 6.0000 | MEDICATED_PAD | Freq: Once | CUTANEOUS | Status: DC

## 2018-12-02 MED ORDER — ACETAMINOPHEN 500 MG PO TABS
ORAL_TABLET | ORAL | Status: AC
  Filled 2018-12-02: qty 2

## 2018-12-02 MED ORDER — FENTANYL CITRATE (PF) 100 MCG/2ML IJ SOLN
INTRAMUSCULAR | Status: AC
  Filled 2018-12-02: qty 2

## 2018-12-02 MED ORDER — VANCOMYCIN HCL IN DEXTROSE 1-5 GM/200ML-% IV SOLN
INTRAVENOUS | Status: AC
  Filled 2018-12-02: qty 200

## 2018-12-02 MED ORDER — ACETAMINOPHEN 500 MG PO TABS
1000.0000 mg | ORAL_TABLET | ORAL | Status: AC
  Administered 2018-12-02: 1000 mg via ORAL

## 2018-12-02 MED ORDER — GABAPENTIN 300 MG PO CAPS
300.0000 mg | ORAL_CAPSULE | ORAL | Status: AC
  Administered 2018-12-02: 08:00:00 300 mg via ORAL

## 2018-12-02 MED ORDER — GABAPENTIN 300 MG PO CAPS
ORAL_CAPSULE | ORAL | Status: AC
  Filled 2018-12-02: qty 1

## 2018-12-02 MED ORDER — OXYCODONE HCL 5 MG/5ML PO SOLN: 5.0000 mg | Freq: Once | ORAL | Status: DC | PRN

## 2018-12-02 MED ORDER — LIDOCAINE HCL (CARDIAC) PF 100 MG/5ML IV SOSY
PREFILLED_SYRINGE | INTRAVENOUS | Status: DC | PRN
  Administered 2018-12-02: 60 mg via INTRAVENOUS

## 2018-12-02 MED ORDER — ACETAMINOPHEN 10 MG/ML IV SOLN: 1000.0000 mg | Freq: Once | INTRAVENOUS | Status: DC | PRN

## 2018-12-02 MED ORDER — FENTANYL CITRATE (PF) 100 MCG/2ML IJ SOLN: 25.0000 ug | INTRAMUSCULAR | Status: DC | PRN

## 2018-12-02 MED ORDER — PROPOFOL 10 MG/ML IV BOLUS
INTRAVENOUS | Status: DC | PRN
  Administered 2018-12-02: 150 mg via INTRAVENOUS

## 2018-12-02 MED ORDER — CELECOXIB 200 MG PO CAPS
200.0000 mg | ORAL_CAPSULE | ORAL | Status: AC
  Administered 2018-12-02: 200 mg via ORAL

## 2018-12-02 MED ORDER — MIDAZOLAM HCL 2 MG/2ML IJ SOLN
1.0000 mg | INTRAMUSCULAR | Status: DC | PRN
  Administered 2018-12-02: 2 mg via INTRAVENOUS

## 2018-12-02 MED ORDER — FENTANYL CITRATE (PF) 100 MCG/2ML IJ SOLN
50.0000 ug | INTRAMUSCULAR | Status: DC | PRN
  Administered 2018-12-02: 100 ug via INTRAVENOUS

## 2018-12-02 SURGICAL SUPPLY — 39 items
ADH SKN CLS APL DERMABOND .7 (GAUZE/BANDAGES/DRESSINGS) ×1
BLADE SURG 15 STRL LF DISP TIS (BLADE) ×1 IMPLANT
BLADE SURG 15 STRL SS (BLADE) ×1
CANISTER SUCT 1200ML W/VALVE (MISCELLANEOUS) ×2 IMPLANT
CHLORAPREP W/TINT 26 (MISCELLANEOUS) ×2 IMPLANT
CLIP VESOCCLUDE SM WIDE 6/CT (CLIP) IMPLANT
COVER BACK TABLE REUSABLE LG (DRAPES) ×2 IMPLANT
COVER MAYO STAND REUSABLE (DRAPES) ×2 IMPLANT
COVER WAND RF STERILE (DRAPES) IMPLANT
DECANTER SPIKE VIAL GLASS SM (MISCELLANEOUS) IMPLANT
DERMABOND ADVANCED (GAUZE/BANDAGES/DRESSINGS) ×1
DERMABOND ADVANCED .7 DNX12 (GAUZE/BANDAGES/DRESSINGS) ×1 IMPLANT
DRAPE LAPAROSCOPIC ABDOMINAL (DRAPES) ×2 IMPLANT
DRAPE UTILITY XL STRL (DRAPES) ×2 IMPLANT
ELECT COATED BLADE 2.86 ST (ELECTRODE) ×2 IMPLANT
ELECT REM PT RETURN 9FT ADLT (ELECTROSURGICAL) ×2
ELECTRODE REM PT RTRN 9FT ADLT (ELECTROSURGICAL) ×1 IMPLANT
GLOVE BIO SURGEON STRL SZ 6.5 (GLOVE) ×2 IMPLANT
GLOVE BIO SURGEON STRL SZ7.5 (GLOVE) ×2 IMPLANT
GLOVE BIOGEL PI IND STRL 6.5 (GLOVE) ×1 IMPLANT
GLOVE BIOGEL PI INDICATOR 6.5 (GLOVE) ×1
GOWN STRL REUS W/ TWL LRG LVL3 (GOWN DISPOSABLE) ×2 IMPLANT
GOWN STRL REUS W/TWL LRG LVL3 (GOWN DISPOSABLE) ×2
ILLUMINATOR WAVEGUIDE N/F (MISCELLANEOUS) IMPLANT
LIGHT WAVEGUIDE WIDE FLAT (MISCELLANEOUS) IMPLANT
NEEDLE HYPO 25X1 1.5 SAFETY (NEEDLE) ×2 IMPLANT
NS IRRIG 1000ML POUR BTL (IV SOLUTION) ×2 IMPLANT
PACK BASIN DAY SURGERY FS (CUSTOM PROCEDURE TRAY) ×2 IMPLANT
PENCIL BUTTON HOLSTER BLD 10FT (ELECTRODE) ×2 IMPLANT
SLEEVE SCD COMPRESS KNEE MED (MISCELLANEOUS) ×2 IMPLANT
SPONGE LAP 18X18 RF (DISPOSABLE) ×2 IMPLANT
SUT MON AB 4-0 PC3 18 (SUTURE) ×2 IMPLANT
SUT SILK 2 0 SH (SUTURE) ×2 IMPLANT
SUT VICRYL 3-0 CR8 SH (SUTURE) ×2 IMPLANT
SYR CONTROL 10ML LL (SYRINGE) ×2 IMPLANT
TOWEL GREEN STERILE FF (TOWEL DISPOSABLE) ×2 IMPLANT
TRAY FAXITRON CT DISP (TRAY / TRAY PROCEDURE) IMPLANT
TUBE CONNECTING 20X1/4 (TUBING) ×2 IMPLANT
YANKAUER SUCT BULB TIP NO VENT (SUCTIONS) ×2 IMPLANT

## 2018-12-02 NOTE — Interval H&P Note (Signed)
History and Physical Interval Note:  12/02/2018 7:56 AM  Christie Ramirez  has presented today for surgery, with the diagnosis of LEFT BREAST SEBACEOUS CYST.  The various methods of treatment have been discussed with the patient and family. After consideration of risks, benefits and other options for treatment, the patient has consented to  Procedure(s): EXCISION LEFT BREAST SEBACEOUS CYST (Left) as a surgical intervention.  The patient's history has been reviewed, patient examined, no change in status, stable for surgery.  I have reviewed the patient's chart and labs.  Questions were answered to the patient's satisfaction.     Autumn Messing III

## 2018-12-02 NOTE — Op Note (Signed)
12/02/2018  9:00 AM  PATIENT:  Christie Ramirez  63 y.o. female  PRE-OPERATIVE DIAGNOSIS:  LEFT BREAST SEBACEOUS CYST  POST-OPERATIVE DIAGNOSIS:  LEFT BREAST SEBACEOUS CYST  PROCEDURE:  Procedure(s): EXCISION LEFT BREAST SEBACEOUS CYST (Left)  SURGEON:  Surgeon(s) and Role:    Jovita Kussmaul, MD - Primary  PHYSICIAN ASSISTANT:   ASSISTANTS: none   ANESTHESIA:   local and general  EBL:  Minimal   BLOOD ADMINISTERED:none  DRAINS: none   LOCAL MEDICATIONS USED:  MARCAINE     SPECIMEN:  Source of Specimen:  left breast sebaceous cyst  DISPOSITION OF SPECIMEN:  PATHOLOGY  COUNTS:  YES  TOURNIQUET:  * No tourniquets in log *  DICTATION: .Dragon Dictation   After informed consent was obtained the patient was brought to the operating room and placed in the supine position on the operating table.  After adequate induction of general anesthesia the patient's left chest and breast area were prepped with ChloraPrep, allowed to dry, and draped in usual sterile manner.  The area around the palpable cyst in the upper inner chest wall was infiltrated with quarter percent Marcaine.  An elliptical incision was made in the skin overlying the palpable cyst with a 15 blade knife.  The incision was carried through the skin and subcutaneous tissue sharply with a 15 blade knife until the entire cyst was removed.  The cyst was sent to pathology for further evaluation.  Hemostasis was achieved using the Bovie electrocautery.  The wound was infiltrated with more quarter percent Marcaine.  The deep layer of the wound was then closed with interrupted 3-0 Vicryl stitches.  The skin was closed with a running 4-0 Monocryl subcuticular stitch.  Dermabond dressings were applied.  The patient tolerated the procedure well.  At the end of the case all needle sponge and instrument counts were correct.  The patient was then awakened and taken to recovery in stable condition.  The cyst measured about 2 x 1.5  cm.  PLAN OF CARE: Discharge to home after PACU  PATIENT DISPOSITION:  PACU - hemodynamically stable.   Delay start of Pharmacological VTE agent (>24hrs) due to surgical blood loss or risk of bleeding: not applicable

## 2018-12-02 NOTE — Anesthesia Procedure Notes (Signed)
Procedure Name: LMA Insertion Date/Time: 12/02/2018 8:37 AM Performed by: Signe Colt, CRNA Pre-anesthesia Checklist: Patient identified, Emergency Drugs available, Suction available and Patient being monitored Patient Re-evaluated:Patient Re-evaluated prior to induction Oxygen Delivery Method: Circle system utilized Preoxygenation: Pre-oxygenation with 100% oxygen Induction Type: IV induction Ventilation: Mask ventilation without difficulty LMA: LMA inserted LMA Size: 4.0 Number of attempts: 1 Airway Equipment and Method: Bite block Placement Confirmation: positive ETCO2 Tube secured with: Tape Dental Injury: Teeth and Oropharynx as per pre-operative assessment

## 2018-12-02 NOTE — Discharge Instructions (Signed)
No Tylenol until 1:30 PM on 12/02/2018. No Ibuprofen until 3:30 PM on 12/02/2018.    Post Anesthesia Home Care Instructions  Activity: Get plenty of rest for the remainder of the day. A responsible individual must stay with you for 24 hours following the procedure.  For the next 24 hours, DO NOT: -Drive a car -Paediatric nurse -Drink alcoholic beverages -Take any medication unless instructed by your physician -Make any legal decisions or sign important papers.  Meals: Start with liquid foods such as gelatin or soup. Progress to regular foods as tolerated. Avoid greasy, spicy, heavy foods. If nausea and/or vomiting occur, drink only clear liquids until the nausea and/or vomiting subsides. Call your physician if vomiting continues.  Special Instructions/Symptoms: Your throat may feel dry or sore from the anesthesia or the breathing tube placed in your throat during surgery. If this causes discomfort, gargle with warm salt water. The discomfort should disappear within 24 hours.  If you had a scopolamine patch placed behind your ear for the management of post- operative nausea and/or vomiting:  1. The medication in the patch is effective for 72 hours, after which it should be removed.  Wrap patch in a tissue and discard in the trash. Wash hands thoroughly with soap and water. 2. You may remove the patch earlier than 72 hours if you experience unpleasant side effects which may include dry mouth, dizziness or visual disturbances. 3. Avoid touching the patch. Wash your hands with soap and water after contact with the patch.        Call your surgeon if you experience:   1.  Fever over 101.0. 2.  Inability to urinate. 3.  Nausea and/or vomiting. 4.  Extreme swelling or bruising at the surgical site. 5.  Continued bleeding from the incision. 6.  Increased pain, redness or drainage from the incision. 7.  Problems related to your pain medication. 8.  Any problems and/or concerns

## 2018-12-02 NOTE — H&P (Signed)
Christie Ramirez  Location: [facility] Patient #: [id number] DOB: 11/23/1955 Married / Language: English / Race: White Female   History of Present Illness The patient is a 63 year old female who presents with a breast mass. We are asked to see the patient in consultation by Dr. Timoteo Ace to evaluate her for a left breast mass. The patient is a 63 year old white female who has felt a small mass in the skin of the upper inner left breast for about 30 years. Over the last year it is starting to get larger. She denies any fevers or chills. She denies any drainage from the area. She does have a history of right breast cancer about 5-1/2 years ago that was treated with lumpectomy. 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 She states that she was initially treated with an aromatase inhibitor but started having some bone changes so she stopped the medicine. Apparently that oncologist and fired her for this. She subsequently saw someone else who put her on raloxifene and she has been on that ever since.   Allergies  Iodine (Antiseptic) *ANTISEPTICS & DISINFECTANTS*  Codeine Phosphate *ANALGESICS - OPIOID*  Penicillins  Allergies Reconciled   Medication History  Raloxifene HCl (60MG Tablet, Oral) Active. Levothyroxine Sodium (175MCG Tablet, Oral) Active. Escitalopram Oxalate (20MG Tablet, Oral) Active. Ezetimibe (10MG Tablet, Oral) Active. amLODIPine Besylate (10MG Tablet, Oral) Active. Atorvastatin Calcium (40MG Tablet, Oral) Active. Losartan Potassium (25MG Tablet, Oral) Active. traZODone HCl (50MG Tablet, Oral) Active. Medications Reconciled  Pregnancy / Birth History  Age at menarche  57 years. Maternal age  33-35 Para  0  Other Problems  Thyroid Cancer     Review of Systems  General Not Present- Appetite Loss, Chills, Fatigue, Fever, Night Sweats, Weight Gain and Weight Loss. Note: All other systems negative  (unless as noted in HPI & included Review of Systems) Skin Not Present- Change in Wart/Mole, Dryness, Hives, Jaundice, New Lesions, Non-Healing Wounds, Rash and Ulcer. HEENT Not Present- Earache, Hearing Loss, Hoarseness, Nose Bleed, Oral Ulcers, Ringing in the Ears, Seasonal Allergies, Sinus Pain, Sore Throat, Visual Disturbances, Wears glasses/contact lenses and Yellow Eyes. Respiratory Not Present- Bloody sputum, Chronic Cough, Difficulty Breathing, Snoring and Wheezing. Breast Not Present- Breast Mass, Breast Pain, Nipple Discharge and Skin Changes. Cardiovascular Not Present- Chest Pain, Difficulty Breathing Lying Down, Leg Cramps, Palpitations, Rapid Heart Rate, Shortness of Breath and Swelling of Extremities. Gastrointestinal Not Present- Abdominal Pain, Bloating, Bloody Stool, Change in Bowel Habits, Chronic diarrhea, Constipation, Difficulty Swallowing, Excessive gas, Gets full quickly at meals, Hemorrhoids, Indigestion, Nausea, Rectal Pain and Vomiting. Female Genitourinary Not Present- Frequency, Nocturia, Painful Urination, Pelvic Pain and Urgency. Musculoskeletal Not Present- Back Pain, Joint Pain, Joint Stiffness, Muscle Pain, Muscle Weakness and Swelling of Extremities. Neurological Not Present- Decreased Memory, Fainting, Headaches, Numbness, Seizures, Tingling, Tremor, Trouble walking and Weakness. Psychiatric Not Present- Anxiety, Bipolar, Change in Sleep Pattern, Depression, Fearful and Frequent crying. Endocrine Not Present- Cold Intolerance, Excessive Hunger, Hair Changes, Heat Intolerance, Hot flashes and New Diabetes. Hematology Not Present- Easy Bruising, Excessive bleeding, Gland problems, HIV and Persistent Infections.  Vitals  Weight: 167.8 lb Height: 61in Body Surface Area: 1.75 m Body Mass Index: 31.71 kg/m  Temp.: 98.86F  Pulse: 100 (Regular)  BP: 146/84 (Sitting, Left Arm, Standard)       Physical Exam  General Mental Status-Alert. General  Appearance-Consistent with stated age. Hydration-Well hydrated. Voice-Normal.  Head and Neck Head-normocephalic, atraumatic with no lesions or  palpable masses. Trachea-midline. Thyroid Gland Characteristics - normal size and consistency.  Eye Eyeball - Bilateral-Extraocular movements intact. Sclera/Conjunctiva - Bilateral-No scleral icterus.  Chest and Lung Exam Chest and lung exam reveals -quiet, even and easy respiratory effort with no use of accessory muscles and on auscultation, normal breath sounds, no adventitious sounds and normal vocal resonance. Inspection Chest Wall - Normal. Back - normal.  Breast Note: There is a 2 cm palpable mass in the skin of the upper inner left breast that is very consistent with a sebaceous cyst. There is no sign of infection. There is no palpable mass in either breast. There is a small palpable mobile lymph node in the left axilla. It is not clear whether this was imaged during her recent ultrasound   Cardiovascular Cardiovascular examination reveals -normal heart sounds, regular rate and rhythm with no murmurs and normal pedal pulses bilaterally.  Abdomen Inspection Inspection of the abdomen reveals - No Hernias. Skin - Scar - no surgical scars. Palpation/Percussion Palpation and Percussion of the abdomen reveal - Soft, Non Tender, No Rebound tenderness, No Rigidity (guarding) and No hepatosplenomegaly. Auscultation Auscultation of the abdomen reveals - Bowel sounds normal.  Neurologic Neurologic evaluation reveals -alert and oriented x 3 with no impairment of recent or remote memory. Mental Status-Normal.  Musculoskeletal Normal Exam - Left-Upper Extremity Strength Normal and Lower Extremity Strength Normal. Normal Exam - Right-Upper Extremity Strength Normal and Lower Extremity Strength Normal.  Lymphatic Head & Neck  General Head & Neck Lymphatics: Bilateral - Description - Normal. Axillary  General  Axillary Region: Bilateral - Description - Normal. Tenderness - Non Tender. Femoral & Inguinal  Generalized Femoral & Inguinal Lymphatics: Bilateral - Description - Normal. Tenderness - Non Tender.    Assessment & Plan  LEFT BREAST MASS (N63.20) Impression: The patient has what appears to be a sebaceous cyst in the upper inner quadrant of the left breast. Since it is getting larger and with her history of right breast cancer she would like to have this removed and I think that is a reasonable thing to do. I have discussed with her in detail the risks and benefits of the operation as well as some of the technical aspects and she understands and wishes to proceed. She would like to do this under sedation which we can do for her very easily. She does have a palpable lymph node in the left axilla. I will check with the radiologist to see if this was imaged during her recent ultrasound. If not we will order this to be done. I will also refer her back to medical oncology since she is over 5 years out from her diagnosis of right breast cancer and is still taking raloxifene Current Plans Referred to Oncology, for evaluation and follow up (Oncology). Routine.

## 2018-12-02 NOTE — Transfer of Care (Signed)
Immediate Anesthesia Transfer of Care Note  Patient: Christie Ramirez  Procedure(s) Performed: EXCISION LEFT BREAST SEBACEOUS CYST (Left Breast)  Patient Location: PACU  Anesthesia Type:General  Level of Consciousness: awake, alert , oriented and patient cooperative  Airway & Oxygen Therapy: Patient Spontanous Breathing and Patient connected to face mask oxygen  Post-op Assessment: Report given to RN and Post -op Vital signs reviewed and stable  Post vital signs: Reviewed and stable  Last Vitals:  Vitals Value Taken Time  BP    Temp    Pulse 98 12/02/18 0903  Resp    SpO2 97 % 12/02/18 0903  Vitals shown include unvalidated device data.  Last Pain:  Vitals:   12/02/18 0727  TempSrc: Oral  PainSc: 0-No pain      Patients Stated Pain Goal: 4 ([phone] [id number])  Complications: No apparent anesthesia complications

## 2018-12-02 NOTE — Anesthesia Postprocedure Evaluation (Signed)
Anesthesia Post Note  Patient: Christie Ramirez  Procedure(s) Performed: EXCISION LEFT BREAST SEBACEOUS CYST (Left Breast)     Patient location during evaluation: PACU Anesthesia Type: General Level of consciousness: awake and alert Pain management: pain level controlled Vital Signs Assessment: post-procedure vital signs reviewed and stable Respiratory status: spontaneous breathing, nonlabored ventilation, respiratory function stable and patient connected to nasal cannula oxygen Cardiovascular status: blood pressure returned to baseline and stable Postop Assessment: no apparent nausea or vomiting Anesthetic complications: no    Last Vitals:  Vitals:   12/02/18 0945 12/02/18 1003  BP: 129/86 (!) 154/97  Pulse: 90 96  Resp: (!) 21 18  Temp:  36.9 C  SpO2: 93% 95%    Last Pain:  Vitals:   12/02/18 1003  TempSrc: Oral  PainSc: 0-No pain                 Barnet Glasgow

## 2018-12-03 ENCOUNTER — Encounter (HOSPITAL_BASED_OUTPATIENT_CLINIC_OR_DEPARTMENT_OTHER): Payer: Self-pay | Admitting: General Surgery

## 2018-12-03 LAB — SURGICAL PATHOLOGY

## 2018-12-06 ENCOUNTER — Telehealth: Payer: Self-pay

## 2018-12-06 NOTE — Telephone Encounter (Signed)
I called and scheduled a nurse visit for the patient to get a shingrix.  Christie Ramirez,cma

## 2018-12-06 NOTE — Telephone Encounter (Signed)
Copied from [place] [phone]. Topic: General - Other >> Dec 06, 2018 12:43 PM Carolyn Stare wrote: Pt would like to get a shingrix shot went to [facility] they told her the insurance will only cover it if she go to the office

## 2018-12-09 ENCOUNTER — Other Ambulatory Visit: Payer: Self-pay

## 2018-12-10 ENCOUNTER — Other Ambulatory Visit: Payer: Self-pay

## 2018-12-10 ENCOUNTER — Encounter: Payer: Self-pay | Admitting: Oncology

## 2018-12-10 ENCOUNTER — Inpatient Hospital Stay: Payer: 59 | Attending: Oncology | Admitting: Oncology

## 2018-12-10 VITALS — BP 134/89 | HR 102 | Temp 97.8°F | Resp 18 | Ht 61.0 in | Wt 171.3 lb

## 2018-12-10 DIAGNOSIS — K219 Gastro-esophageal reflux disease without esophagitis: Secondary | ICD-10-CM | POA: Insufficient documentation

## 2018-12-10 DIAGNOSIS — I10 Essential (primary) hypertension: Secondary | ICD-10-CM | POA: Insufficient documentation

## 2018-12-10 DIAGNOSIS — Z79810 Long term (current) use of selective estrogen receptor modulators (SERMs): Secondary | ICD-10-CM | POA: Diagnosis not present

## 2018-12-10 DIAGNOSIS — E785 Hyperlipidemia, unspecified: Secondary | ICD-10-CM | POA: Diagnosis not present

## 2018-12-10 DIAGNOSIS — Z7982 Long term (current) use of aspirin: Secondary | ICD-10-CM | POA: Insufficient documentation

## 2018-12-10 DIAGNOSIS — M8580 Other specified disorders of bone density and structure, unspecified site: Secondary | ICD-10-CM

## 2018-12-10 DIAGNOSIS — G4730 Sleep apnea, unspecified: Secondary | ICD-10-CM | POA: Insufficient documentation

## 2018-12-10 DIAGNOSIS — C50212 Malignant neoplasm of upper-inner quadrant of left female breast: Secondary | ICD-10-CM

## 2018-12-10 DIAGNOSIS — E039 Hypothyroidism, unspecified: Secondary | ICD-10-CM | POA: Diagnosis not present

## 2018-12-10 DIAGNOSIS — F418 Other specified anxiety disorders: Secondary | ICD-10-CM | POA: Insufficient documentation

## 2018-12-10 DIAGNOSIS — Z170 Estrogen receptor positive status [ER+]: Secondary | ICD-10-CM | POA: Diagnosis not present

## 2018-12-10 DIAGNOSIS — Z923 Personal history of irradiation: Secondary | ICD-10-CM | POA: Diagnosis not present

## 2018-12-10 DIAGNOSIS — N6322 Unspecified lump in the left breast, upper inner quadrant: Secondary | ICD-10-CM | POA: Diagnosis not present

## 2018-12-10 DIAGNOSIS — Z79899 Other long term (current) drug therapy: Secondary | ICD-10-CM | POA: Insufficient documentation

## 2018-12-10 DIAGNOSIS — Z853 Personal history of malignant neoplasm of breast: Secondary | ICD-10-CM

## 2018-12-11 NOTE — Progress Notes (Signed)
Hematology/Oncology Consult note [facility] [phone] [phone]   Patient Care Team: Leone Haven, MD as PCP - General (Family Medicine) Bary Castilla, Forest Gleason, MD (General Surgery) Jackolyn Confer, MD (Internal Medicine)  REFERRING PROVIDER: Jovita Kussmaul, MD  CHIEF COMPLAINTS/REASON FOR VISIT:  Evaluation of history of breast cancer.  HISTORY OF PRESENTING ILLNESS:   Christie Ramirez is a  63 y.o.  female with PMH listed below was seen in consultation at the request of  Jovita Kussmaul, MD  for evaluation of history of breast cancer. Patient previously was seen by Dr. Jeb Levering for 1 time.  She wants to reestablish care. Patient was recently seen by Dr. Marlou Starks as she felt a small mass in the upper inner left breast.  The size starts to grow and gets bigger. Extensive medical records reviewed was performed by me. Patient has a history of pT1a pN0 right breast cancer, diagnosed in April 2015, ER/PR positive, HER-2 negative, grade 1, DCIS present, margins negative.  Status post lumpectomy,adjuvant radiation.  Patient was recommended by Dr. Oliva Bustard to start Letrozole. She tried for very short period of time and self stopped.  Per patient she was discharged from cancer center as she was not taking her medication. Patient follows up with her OB/GYN Dr. Enzo Bi, [facility] physician and was prescribed on Raloxifen 1m daily. Per records, she was started on Raloxifen in Feb 2018,   Tolerates well.  Denies any personal history of blood clots. She has history of hysterectomy. Her OB/GYN doctor recently retired.  She wants to establish care with oncology for further management of stage I Patient has anxiety and depression.  She comes to the clinic with her service dog.   Review of Systems  Constitutional: Negative for appetite change, chills, fatigue and fever.  HENT:   Negative for hearing loss and voice change.   Eyes: Negative for eye problems.   Respiratory: Negative for chest tightness and cough.   Cardiovascular: Negative for chest pain.  Gastrointestinal: Negative for abdominal distention, abdominal pain and blood in stool.  Endocrine: Negative for hot flashes.  Genitourinary: Negative for difficulty urinating and frequency.   Musculoskeletal: Negative for arthralgias.  Skin: Negative for itching and rash.  Neurological: Negative for extremity weakness.  Hematological: Negative for adenopathy.  Psychiatric/Behavioral: Negative for confusion.    MEDICAL HISTORY:  Past Medical History:  Diagnosis Date  . Anxiety   . Breast cancer of upper-inner quadrant of [place] 05/[place]   Tubular carcinoma, T1b,N0,M0; ER/ PR 90%, her 2 neu not overexpressed.   . [place]   thyroid  . Endometriosis 2009  . Fibula fracture 11/11/2015  . GERD (gastroesophageal reflux disease)   . Hyperlipidemia   . Hypertension   . Hypothyroidism   . Osteopenia    rt hip  . Personal history of radiation therapy 2015   mammosite  . Seasonal allergies   . Sleep apnea    does not wear CPAP    SURGICAL HISTORY: Past Surgical History:  Procedure Laterality Date  . ABDOMINAL HYSTERECTOMY  2005   Martin DeFrancesco,MD  . BREAST BIOPSY Right 04-02-13   INVASIVE MAMMARY CARCINOMA WITH FEATURES OF TUBULAR CARCINOMA,  . BREAST CYST EXCISION Left 12/02/2018   Procedure: EXCISION LEFT BREAST SEBACEOUS CYST;  Surgeon: TJovita Kussmaul MD;  [facility]  Service: General;  Laterality: Left;  . BREAST LUMPECTOMY Right 04/2013   INVASIVE MAMMARY CARCINOMA WITH FEATURES OF TUBULAR CARCINOMA,  .  BREAST SURGERY Right 05/01/13   wide excision  . colectomy  2009   secondary to endometriosis DR. Smith  . 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 No malignancy.  . COLONOSCOPY  2009   Dr. Tamala Julian  . COLONOSCOPY WITH PROPOFOL N/A 11/19/2018   Procedure:  COLONOSCOPY WITH PROPOFOL;  Surgeon: Lucilla Lame, MD;  Location: [facility] ENDOSCOPY;  Service: Endoscopy;  Laterality: N/A;  . FOOT SURGERY  right    SOCIAL HISTORY: Social History   Socioeconomic History  . Marital status: Married    Spouse name: Not on file  . Number of children: Not on file  . Years of education: Not on file  . Highest education level: Not on file  Occupational History  . Not on file  Social Needs  . Financial resource strain: Not on file  . Food insecurity    Worry: Not on file    Inability: Not on file  . Transportation needs    Medical: Not on file    Non-medical: Not on file  Tobacco Use  . Smoking status: Never Smoker  . Smokeless tobacco: Never Used  Substance and Sexual Activity  . Alcohol use: Yes    Alcohol/week: 0.0 standard drinks    Comment: wine socially  . Drug use: No  . Sexual activity: Yes    Birth control/protection: Surgical  Lifestyle  . Physical activity    Days per week: Not on file    Minutes per session: Not on file  . Stress: Not on file  Relationships  . Social Herbalist on phone: Not on file    Gets together: Not on file    Attends religious service: Not on file    Active member of club or organization: Not on file    Attends meetings of clubs or organizations: Not on file    Relationship status: Not on file  . Intimate partner violence    Fear of current or ex partner: Not on file    Emotionally abused: Not on file    Physically abused: Not on file    Forced sexual activity: Not on file  Other Topics Concern  . Not on file  Social History Narrative  . Not on file    FAMILY HISTORY: Family History  Problem Relation Age of Onset  . Colon polyps Father        age 74  . Heart disease Father   . Bone cancer Father   . Heart disease Mother   . Rheumatic fever Mother   . Heart disease Paternal Grandmother   . Heart disease Paternal Grandfather   . Cancer Neg Hx   . Diabetes Neg Hx   . Breast cancer  Neg Hx     ALLERGIES:  is allergic to codeine; iodinated diagnostic agents; iodine; and penicillins.  MEDICATIONS:  Current Outpatient Medications  Medication Sig Dispense Refill  . albuterol (PROVENTIL HFA;VENTOLIN HFA) 108 (90 Base) MCG/ACT inhaler Inhale 2 puffs into the lungs every 6 (six) hours as needed for wheezing or shortness of breath. 1 Inhaler 0  . amLODipine (NORVASC) 5 MG tablet 10 mg.   2  . aspirin 81 MG tablet Take 81 mg by mouth daily.    Marland Kitchen atorvastatin (LIPITOR) 40 MG tablet TAKE 1 TABLET (40 MG TOTAL) BY MOUTH DAILY. 90 tablet 0  . CALCIUM-MAGNESIUM-ZINC PO Take by mouth 3 (three) times daily.    . cetirizine (ZYRTEC) 10  MG tablet SMARTSIG:1 Pill By Mouth Daily PRN    . Cholecalciferol (VITAMIN D-3 PO) Take 1,000 Units by mouth.    . escitalopram (LEXAPRO) 10 MG tablet TAKE 1 TABLET BY MOUTH EVERY DAY 90 tablet 0  . ezetimibe (ZETIA) 10 MG tablet Take 1 tablet (10 mg total) by mouth daily. 90 tablet 1  . fluticasone (FLONASE) 50 MCG/ACT nasal spray Place 2 sprays into both nostrils daily.    Marland Kitchen gabapentin (NEURONTIN) 100 MG capsule Take 1 capsule (100 mg total) by mouth at bedtime. 30 capsule 2  . GARLIC PO Take 885 mg by mouth.    . levothyroxine (SYNTHROID) 175 MCG tablet Take 1 tablet (175 mcg total) by mouth daily before breakfast. 90 tablet 1  . losartan (COZAAR) 25 MG tablet Take 4 tablets (100 mg total) by mouth daily. 360 tablet 0  . Multiple Vitamins-Minerals (MULTIVITAMIN WITH MINERALS) tablet Take 1 tablet by mouth daily.    Marland Kitchen omeprazole (PRILOSEC) 20 MG capsule TAKE 1 CAPSULE BY MOUTH EVERY DAY 90 capsule 1  . raloxifene (EVISTA) 60 MG tablet Take 60 mg by mouth daily.  3  . traMADol (ULTRAM) 50 MG tablet Take 1-2 tablets (50-100 mg total) by mouth every 6 (six) hours as needed. 15 tablet 0  . traZODone (DESYREL) 50 MG tablet Take 2 tablets (100 mg total) by mouth at bedtime as needed for sleep. 60 tablet 3  . vitamin C (ASCORBIC ACID) 500 MG tablet Take 500  mg by mouth daily.     No current facility-administered medications for this visit.      PHYSICAL EXAMINATION: ECOG PERFORMANCE STATUS: 0 - Asymptomatic Vitals:   12/10/18 1105  BP: 134/89  Pulse: (!) 102  Resp: 18  Temp: 97.8 F (36.6 C)   Filed Weights   12/10/18 1105  Weight: 171 lb 4.8 oz (77.7 kg)    Physical Exam Constitutional:      General: She is not in acute distress. HENT:     Head: Normocephalic and atraumatic.  Eyes:     General: No scleral icterus.    Pupils: Pupils are equal, round, and reactive to light.  Neck:     Musculoskeletal: Normal range of motion and neck supple.  Cardiovascular:     Rate and Rhythm: Normal rate and regular rhythm.     Heart sounds: Normal heart sounds.  Pulmonary:     Effort: Pulmonary effort is normal. No respiratory distress.     Breath sounds: No wheezing.  Abdominal:     General: Bowel sounds are normal. There is no distension.     Palpations: Abdomen is soft. There is no mass.     Tenderness: There is no abdominal tenderness.  Musculoskeletal: Normal range of motion.        General: No deformity.  Skin:    General: Skin is warm and dry.     Findings: No erythema or rash.  Neurological:     Mental Status: She is alert and oriented to person, place, and time.     Cranial Nerves: No cranial nerve deficit.     Coordination: Coordination normal.  Psychiatric:        Behavior: Behavior normal.        Thought Content: Thought content normal.   Breast exam was performed in seated and lying down position. Patient is status post right lumpectomy with a well-healed surgical scar.  Left breast status post recent epidermal inclusion cyst resection, healing scar. No palpable breast masses bilaterally.  No palpable axillary lymphadenopathy.   LABORATORY DATA:  I have reviewed the data as listed Lab Results  Component Value Date   WBC 10.4 11/23/2018   HGB 12.6 11/23/2018   HCT 38.8 11/23/2018   MCV 86.4 11/23/2018    PLT 362 11/23/2018   Recent Labs    10/15/18 1514 11/23/18 0050  NA 139 142  K 4.0 4.0  CL 100 108  CO2 27 25  GLUCOSE 89 107*  BUN 16 15  CREATININE 0.62 0.65  CALCIUM 10.1 9.3  GFRNONAA  --  >60  GFRAA  --  >60  PROT 7.1 7.5  ALBUMIN 4.5 4.1  AST 12 16  ALT 21 28  ALKPHOS 99 97  BILITOT 0.6 0.6   Iron/TIBC/Ferritin/ %Sat No results found for: IRON, TIBC, FERRITIN, IRONPCTSAT    RADIOGRAPHIC STUDIES: I have personally reviewed the radiological images as listed and agreed with the findings in the report.  Ct Abdomen Pelvis Wo Contrast  Result Date: 11/25/2018 CLINICAL DATA:  Abdominal pain, primarily right-sided. History of breast and thyroid carcinoma EXAM: CT ABDOMEN AND PELVIS WITHOUT CONTRAST TECHNIQUE: Multidetector CT imaging of the abdomen and pelvis was performed following the standard protocol without IV contrast. Oral contrast was administered. COMPARISON:  December 26, 2006 FINDINGS: Lower chest: Lung bases are clear. There is a small hiatal hernia with reflux of oral contrast into the distal esophagus. Hepatobiliary: A previously noted hemangioma in the right lobe of the liver toward the dome is much better seen on prior contrast enhanced study. There is somewhat vague decreased attenuation in this area which on noncontrast enhanced study measures approximately 3 x 2.5 cm. This hemangioma appear larger on contrast enhanced study previously. No other liver lesions are evident on this noncontrast enhanced study. Gallbladder wall is not appreciably thickened. There is no biliary duct dilatation. Pancreas: There is no pancreatic mass or inflammatory focus. Spleen: No splenic lesions are evident. Adrenals/Urinary Tract: Adrenals bilaterally appear normal. Kidneys bilaterally show no evident mass or hydronephrosis on either side. There is no renal or ureteral calculus on either side. Urinary bladder is midline with wall thickness within normal limits. Stomach/Bowel: There is no  appreciable bowel wall or mesenteric thickening. There is postoperative change in the right colonic region with anastomosis patent. There is moderate stool throughout the colon. No evident bowel obstruction. The terminal ileum region appears unremarkable. There is no evident free air or portal venous air. Vascular/Lymphatic: There is no abdominal aortic aneurysm. There is aortic and iliac artery atherosclerosis. No adenopathy is evident in the abdomen or pelvis. Reproductive: Uterus absent.  No pelvic mass evident. Other: No right lower quadrant region inflammatory change. No abscess or ascites evident in the abdomen or pelvis. 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 There are no blastic or lytic bone lesions. There is degenerative change in the lumbar spine. No intramuscular lesions. IMPRESSION: 1. No evident bowel obstruction. Postoperative change right colon. No inflammatory change in the right lower quadrant. No abscess in the abdomen or pelvis. 2. Hiatal hernia with apparent reflux into the hiatal hernia from the stomach. 3. No renal or ureteral calculus. No hydronephrosis. Urinary bladder wall thickness normal. 4.  Stable lipoma immediately posterior to the right femoral neck. 5.  Aortic Atherosclerosis (ICD10-I70.0). Electronically Signed   By: Lowella Grip III M.D.   On: 11/25/2018 09:14   Dg Chest 2 View  Result Date: 11/22/2018 CLINICAL DATA:  Right-sided  chest pain. Recently had Heimlich maneuver for choking. EXAM: CHEST - 2 VIEW COMPARISON:  10/13/2016 FINDINGS: The heart size and mediastinal contours are within normal limits. No evidence of pneumomediastinum. Aortic atherosclerosis. No evidence of pneumothorax or hemothorax. Both lungs are clear. The visualized skeletal structures are unremarkable. IMPRESSION: No active cardiopulmonary disease. Electronically Signed   By: Marlaine Hind M.D.   On: 11/22/2018 21:54   Dg  Foot Complete Left  Result Date: 11/26/2018 Please see detailed radiograph report in office note.  Dg Foot Complete Right  Result Date: 11/26/2018 Please see detailed radiograph report in office note.  US Breast Ltd Uni Left Inc Axilla  Result Date: 11/21/2018 CLINICAL DATA:  Patient presents for a palpable area of concern and tenderness in the left axilla. EXAM: ULTRASOUND OF THE LEFT AXILLA COMPARISON:  Previous exam(s). FINDINGS: On physical exam, I do not palpate a discrete mass or enlarged lymph node. Targeted ultrasound is performed in the left axilla demonstrating multiple normal-appearing lymph nodes with fatty hila and thin cortices. No suspicious cystic or solid abnormality. IMPRESSION: No sonographic abnormality identified in the left axilla. RECOMMENDATION: Recommend any additional workup of the left axilla be on a clinical basis. Surgical management of the left chest wall mass. I have discussed the findings and recommendations with the patient. If applicable, a reminder letter will be sent to the patient regarding the next appointment. BI-RADS CATEGORY  1: Negative. Electronically Signed   By: Audie Pinto M.D.   On: 11/21/2018 10:32      ASSESSMENT & PLAN:  1. History of breast cancer   2. Osteopenia, unspecified location   3. H/O ongoing treatment with raloxifene    #History of stage I pT1a pN0 right breast cancer, diagnosed in April 2015, ER/PR positive, HER-2 negative, Not able to tolerate aromatase inhibitor.  Currently on raloxifene since February 2018.  Tolerating well. Labs reviewed and discussed with patient. Continue current regimen.  We discussed in details about potential side effects of raloxifene. Refills will be sent to her pharmacy. Recent diagnostic bilateral mammogram was independent reviewed and discussed with patient.  Continued annual screening.  Osteopenia.  Recommend patient continue calcium and vitamin D supplementation.  Orders Placed This  Encounter  Procedures  . CBC with Differential    Standing Status:   Future    Standing Expiration Date:   12/10/2019  . Comprehensive metabolic panel    Standing Status:   Future    Standing Expiration Date:   12/10/2019    All questions were answered. The patient knows to call the clinic with any problems questions or concerns.  cc Autumn Messing III, MD    Return of visit: Follow-up in 6 months. Thank you for this kind referral and the opportunity to participate in the care of this patient. A copy of today's note is routed to referring provider  Total face to face encounter time for this patient visit was 45 min. >50% of the time was  spent in counseling and coordination of care.    Earlie Server, MD, PhD Hematology Oncology [facility] [place] at [facility] Pager- [phone] 12/11/2018

## 2018-12-12 ENCOUNTER — Encounter: Payer: Self-pay | Admitting: Internal Medicine

## 2018-12-12 ENCOUNTER — Other Ambulatory Visit: Payer: Self-pay

## 2018-12-12 ENCOUNTER — Ambulatory Visit (INDEPENDENT_AMBULATORY_CARE_PROVIDER_SITE_OTHER): Payer: 59 | Admitting: Internal Medicine

## 2018-12-12 ENCOUNTER — Telehealth: Payer: Self-pay

## 2018-12-12 VITALS — Ht 61.0 in | Wt 166.0 lb

## 2018-12-12 DIAGNOSIS — E559 Vitamin D deficiency, unspecified: Secondary | ICD-10-CM

## 2018-12-12 DIAGNOSIS — Z1389 Encounter for screening for other disorder: Secondary | ICD-10-CM | POA: Diagnosis not present

## 2018-12-12 DIAGNOSIS — F411 Generalized anxiety disorder: Secondary | ICD-10-CM

## 2018-12-12 DIAGNOSIS — F329 Major depressive disorder, single episode, unspecified: Secondary | ICD-10-CM

## 2018-12-12 DIAGNOSIS — F419 Anxiety disorder, unspecified: Secondary | ICD-10-CM

## 2018-12-12 MED ORDER — ESCITALOPRAM OXALATE 10 MG PO TABS: 10.0000 mg | ORAL_TABLET | Freq: Every day | ORAL | 0 refills | Status: DC

## 2018-12-12 MED ORDER — CLONAZEPAM 1 MG PO TABS: 1.0000 mg | ORAL_TABLET | Freq: Every day | ORAL | 2 refills | Status: DC | PRN

## 2018-12-12 NOTE — Telephone Encounter (Signed)
Copied from [place] [id number]. Topic: General - Other >> Dec 12, 2018  8:12 AM Carolyn Stare wrote: Pt said she is having some anxiety and would like to have an appt asap

## 2018-12-12 NOTE — Progress Notes (Signed)
Telephone Note  I connected with Christie Ramirez  on 12/12/18 at 10:30 AM EST by telephone and verified that I am speaking with the correct person using two identifiers.  Location patient: home Location provider:work or home office Persons participating in the virtual visit: patient, provider  I discussed the limitations of evaluation and management by telemedicine and the availability of in person appointments. The patient expressed understanding and agreed to proceed.   HPI: Anxiety and depression GAD 7 21 and PHQ9 score 24 she lost job 12/09/2018 at [employer] and is tearful , trouble sleeping after taking trazadone 100 mg qhs  On lexapro 10 mg but wants to get back on klonopin temp as anxiety is through the roof     ROS: See pertinent positives and negatives per HPI.  Past Medical History:  Diagnosis Date  . Anxiety   . Breast cancer of upper-inner quadrant of right female breast ([place]) 05/01/2013   Tubular carcinoma, T1b,N0,M0; ER/ PR 90%, her 2 neu not overexpressed.   . Cancer ([place]) 1995   thyroid  . Endometriosis 2009  . Fibula fracture 11/11/2015  . GERD (gastroesophageal reflux disease)   . Hyperlipidemia   . Hypertension   . Hypothyroidism   . Osteopenia    rt hip  . Personal history of radiation therapy 2015   mammosite  . Seasonal allergies   . Sleep apnea    does not wear CPAP    Past Surgical History:  Procedure Laterality Date  . ABDOMINAL HYSTERECTOMY  2005   Martin DeFrancesco,MD  . BREAST BIOPSY Right 04-02-13   INVASIVE MAMMARY CARCINOMA WITH FEATURES OF TUBULAR CARCINOMA,  . BREAST CYST EXCISION Left 12/02/2018   Procedure: EXCISION LEFT BREAST SEBACEOUS CYST;  Surgeon: Jovita Kussmaul, MD;  Location: [facility];  Service: General;  Laterality: Left;  . BREAST LUMPECTOMY Right 04/2013   INVASIVE MAMMARY CARCINOMA WITH FEATURES OF TUBULAR CARCINOMA,  . BREAST SURGERY Right 05/01/13   wide excision  . colectomy  2009   secondary to  endometriosis DR. Smith  . 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 No malignancy.  . COLONOSCOPY  2009   Dr. Tamala Julian  . COLONOSCOPY WITH PROPOFOL N/A 11/19/2018   Procedure: COLONOSCOPY WITH PROPOFOL;  Surgeon: Lucilla Lame, MD;  [facility] ENDOSCOPY;  Service: Endoscopy;  Laterality: N/A;  . FOOT SURGERY  right    Family History  Problem Relation Age of Onset  . Colon polyps Father        age 84  . Heart disease Father   . Bone cancer Father   . Heart disease Mother   . Rheumatic fever Mother   . Heart disease Paternal Grandmother   . Heart disease Paternal Grandfather   . Cancer Neg Hx   . Diabetes Neg Hx   . 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  albuterol (PROVENTIL HFA;VENTOLIN HFA) 108 (90 Base) MCG/ACT inhaler, Inhale 2 puffs into the lungs every 6 (six) hours as needed for wheezing or shortness of breath., Disp: 1 Inhaler, Rfl: 0 .  amLODipine (NORVASC) 5 MG tablet, 10 mg. ,  Disp: , Rfl: 2 .  aspirin 81 MG tablet, Take 81 mg by mouth daily., Disp: , Rfl:  .  atorvastatin (LIPITOR) 40 MG tablet, TAKE 1 TABLET (40 MG TOTAL) BY MOUTH DAILY., Disp: 90 tablet, Rfl: 0 .  CALCIUM-MAGNESIUM-ZINC PO, Take by mouth 3 (three) times daily., Disp: , Rfl:  .  cetirizine (ZYRTEC) 10 MG tablet, SMARTSIG:1 Pill By Mouth Daily PRN, Disp: , Rfl:  .  Cholecalciferol (VITAMIN D-3 PO), Take 1,000 Units by mouth., Disp: , Rfl:  .  escitalopram (LEXAPRO) 10 MG tablet, Take 1 tablet (10 mg total) by mouth daily., Disp: 90 tablet, Rfl: 0 .  ezetimibe (ZETIA) 10 MG tablet, Take 1 tablet (10 mg total) by mouth daily., Disp: 90 tablet,  Rfl: 1 .  fluticasone (FLONASE) 50 MCG/ACT nasal spray, Place 2 sprays into both nostrils daily., Disp: , Rfl:  .  gabapentin (NEURONTIN) 100 MG capsule, Take 1 capsule (100 mg total) by mouth at bedtime., Disp: 30 capsule, Rfl: 2 .  GARLIC PO, Take 0000000 mg by mouth., Disp: , Rfl:  .  levothyroxine (SYNTHROID) 175 MCG tablet, Take 1 tablet (175 mcg total) by mouth daily before breakfast., Disp: 90 tablet, Rfl: 1 .  losartan (COZAAR) 25 MG tablet, Take 4 tablets (100 mg total) by mouth daily., Disp: 360 tablet, Rfl: 0 .  Multiple Vitamins-Minerals (MULTIVITAMIN WITH MINERALS) tablet, Take 1 tablet by mouth daily., Disp: , Rfl:  .  omeprazole (PRILOSEC) 20 MG capsule, TAKE 1 CAPSULE BY MOUTH EVERY DAY, Disp: 90 capsule, Rfl: 1 .  raloxifene (EVISTA) 60 MG tablet, Take 60 mg by mouth daily., Disp: , Rfl: 3 .  traMADol (ULTRAM) 50 MG tablet, Take 1-2 tablets (50-100 mg total) by mouth every 6 (six) hours as needed., Disp: 15 tablet, Rfl: 0 .  traZODone (DESYREL) 50 MG tablet, Take 2 tablets (100 mg total) by mouth at bedtime as needed for sleep., Disp: 60 tablet, Rfl: 3 .  vitamin C (ASCORBIC ACID) 500 MG tablet, Take 500 mg by mouth daily., Disp: , Rfl:  .  clonazePAM (KLONOPIN) 1 MG tablet, Take 1 tablet (1 mg total) by mouth daily as needed for anxiety., Disp: 30 tablet, Rfl: 2  EXAM:  VITALS per patient if applicable:  GENERAL: alert, oriented, appears well and in no acute distress  HEENT: atraumatic, conjunttiva clear, no obvious abnormalities on inspection of external nose and ears  NECK: normal movements of the head and neck  LUNGS: on inspection no signs of respiratory distress, breathing rate appears normal, no obvious gross SOB, gasping or wheezing  CV: no obvious cyanosis  MS: moves all visible extremities without noticeable abnormality  PSYCH/NEURO: pleasant and cooperative, no obvious depression or anxiety, speech and thought processing grossly intact  ASSESSMENT AND  PLAN:  Discussed the following assessment and plan:  Anxiety and depression - Plan: escitalopram (LEXAPRO) 10 MG tablet, clonazePAM (KLONOPIN) 1 MG tablet, Ambulatory referral to Psychology osman Disc L theanine 100-200 mg qd otc as well natural supplement for stress/anxiety  GAD (generalized anxiety disorder) - Plan: escitalopram (LEXAPRO) 10 MG tablet, clonazePAM (KLONOPIN) 1 MG tablet, Ambulatory referral to Psychology  Vitamin D deficiency - Plan: Vitamin D (25 hydroxy) rec D3 4000 IU qd     -we discussed possible serious and likely etiologies, options for evaluation and workup, limitations of telemedicine visit vs in person visit, treatment, treatment risks and precautions. Pt prefers to treat via telemedicine empirically rather then risking or undertaking an in person visit at  this moment. Patient agrees to seek prompt in person care if worsening, new symptoms arise, or if is not improving with treatment.   I discussed the assessment and treatment plan with the patient. The patient was provided an opportunity to ask questions and all were answered. The patient agreed with the plan and demonstrated an understanding of the instructions.   The patient was advised to call back or seek an in-person evaluation if the symptoms worsen or if the condition fails to improve as anticipated.  Time spent 20 minutes  Delorise Jackson, MD

## 2018-12-12 NOTE — Patient Instructions (Signed)

## 2018-12-13 MED ORDER — RALOXIFENE HCL 60 MG PO TABS: 60.0000 mg | ORAL_TABLET | Freq: Every day | ORAL | 1 refills | Status: DC

## 2018-12-19 ENCOUNTER — Other Ambulatory Visit: Payer: Self-pay | Admitting: Family Medicine

## 2018-12-24 ENCOUNTER — Ambulatory Visit: Payer: 59

## 2018-12-25 ENCOUNTER — Ambulatory Visit (INDEPENDENT_AMBULATORY_CARE_PROVIDER_SITE_OTHER): Payer: 59

## 2018-12-25 ENCOUNTER — Other Ambulatory Visit: Payer: Self-pay

## 2018-12-25 DIAGNOSIS — Z23 Encounter for immunization: Secondary | ICD-10-CM | POA: Diagnosis not present

## 2018-12-25 NOTE — Progress Notes (Signed)
Patient came in today for first dose of shingrix vaccine in left deltoid. Patient tolerated well.

## 2018-12-25 NOTE — Progress Notes (Signed)
I have reviewed the above note and agree.  Djimon Lundstrom, M.D.  

## 2018-12-30 ENCOUNTER — Other Ambulatory Visit: Payer: Self-pay

## 2018-12-30 ENCOUNTER — Encounter (HOSPITAL_BASED_OUTPATIENT_CLINIC_OR_DEPARTMENT_OTHER): Payer: Self-pay | Admitting: *Deleted

## 2018-12-31 ENCOUNTER — Other Ambulatory Visit (HOSPITAL_COMMUNITY)
Admission: RE | Admit: 2018-12-31 | Discharge: 2018-12-31 | Disposition: A | Discharge status: Home / Self Care | Payer: 59 | Source: Ambulatory Visit | Attending: Plastic Surgery | Admitting: Plastic Surgery

## 2018-12-31 DIAGNOSIS — Z01812 Encounter for preprocedural laboratory examination: Secondary | ICD-10-CM | POA: Insufficient documentation

## 2018-12-31 DIAGNOSIS — Z20828 Contact with and (suspected) exposure to other viral communicable diseases: Secondary | ICD-10-CM | POA: Diagnosis not present

## 2018-12-31 NOTE — Progress Notes (Signed)

## 2019-01-01 LAB — NOVEL CORONAVIRUS, NAA (HOSP ORDER, SEND-OUT TO REF LAB; TAT 18-24 HRS): SARS-CoV-2, NAA: NOT DETECTED

## 2019-01-02 ENCOUNTER — Telehealth: Payer: Self-pay

## 2019-01-02 ENCOUNTER — Other Ambulatory Visit (INDEPENDENT_AMBULATORY_CARE_PROVIDER_SITE_OTHER): Payer: 59

## 2019-01-02 ENCOUNTER — Other Ambulatory Visit: Payer: Self-pay

## 2019-01-02 DIAGNOSIS — Z1389 Encounter for screening for other disorder: Secondary | ICD-10-CM

## 2019-01-02 DIAGNOSIS — E559 Vitamin D deficiency, unspecified: Secondary | ICD-10-CM | POA: Diagnosis not present

## 2019-01-02 NOTE — Telephone Encounter (Signed)
Pt. Left w/o leaving urine sample. Called to inform. Will RTC now

## 2019-01-03 ENCOUNTER — Ambulatory Visit (HOSPITAL_BASED_OUTPATIENT_CLINIC_OR_DEPARTMENT_OTHER): Payer: 59 | Admitting: Anesthesiology

## 2019-01-03 ENCOUNTER — Encounter (HOSPITAL_BASED_OUTPATIENT_CLINIC_OR_DEPARTMENT_OTHER)
Admission: RE | Disposition: A | Discharge status: Home / Self Care | Payer: Self-pay | Source: Home / Self Care | Attending: Plastic Surgery

## 2019-01-03 ENCOUNTER — Encounter (HOSPITAL_BASED_OUTPATIENT_CLINIC_OR_DEPARTMENT_OTHER): Payer: Self-pay | Admitting: Plastic Surgery

## 2019-01-03 ENCOUNTER — Other Ambulatory Visit: Payer: Self-pay | Admitting: Internal Medicine

## 2019-01-03 ENCOUNTER — Ambulatory Visit (HOSPITAL_BASED_OUTPATIENT_CLINIC_OR_DEPARTMENT_OTHER)
Admission: RE | Admit: 2019-01-03 | Discharge: 2019-01-03 | Disposition: A | Discharge status: Home / Self Care | Payer: 59 | Attending: Plastic Surgery | Admitting: Plastic Surgery

## 2019-01-03 ENCOUNTER — Other Ambulatory Visit: Payer: Self-pay

## 2019-01-03 DIAGNOSIS — Z170 Estrogen receptor positive status [ER+]: Secondary | ICD-10-CM | POA: Insufficient documentation

## 2019-01-03 DIAGNOSIS — I10 Essential (primary) hypertension: Secondary | ICD-10-CM | POA: Insufficient documentation

## 2019-01-03 DIAGNOSIS — Z923 Personal history of irradiation: Secondary | ICD-10-CM | POA: Diagnosis not present

## 2019-01-03 DIAGNOSIS — C50911 Malignant neoplasm of unspecified site of right female breast: Secondary | ICD-10-CM | POA: Insufficient documentation

## 2019-01-03 DIAGNOSIS — E559 Vitamin D deficiency, unspecified: Secondary | ICD-10-CM

## 2019-01-03 DIAGNOSIS — Z79810 Long term (current) use of selective estrogen receptor modulators (SERMs): Secondary | ICD-10-CM | POA: Diagnosis not present

## 2019-01-03 DIAGNOSIS — Z9889 Other specified postprocedural states: Secondary | ICD-10-CM | POA: Diagnosis not present

## 2019-01-03 DIAGNOSIS — N6012 Diffuse cystic mastopathy of left breast: Secondary | ICD-10-CM | POA: Insufficient documentation

## 2019-01-03 DIAGNOSIS — N6489 Other specified disorders of breast: Secondary | ICD-10-CM | POA: Insufficient documentation

## 2019-01-03 DIAGNOSIS — G4733 Obstructive sleep apnea (adult) (pediatric): Secondary | ICD-10-CM | POA: Insufficient documentation

## 2019-01-03 HISTORY — PX: MASTOPEXY: SHX5358

## 2019-01-03 LAB — URINALYSIS, ROUTINE W REFLEX MICROSCOPIC
Bilirubin Urine: NEGATIVE
Hgb urine dipstick: NEGATIVE
Ketones, ur: NEGATIVE
Nitrite: NEGATIVE
RBC / HPF: NONE SEEN (ref 0–?)
Specific Gravity, Urine: <=1.005 — AB (ref 1.000–1.030)
Total Protein, Urine: NEGATIVE
Urine Glucose: NEGATIVE
Urobilinogen, UA: 0.2 (ref 0.0–1.0)
pH: 6 (ref 5.0–8.0)

## 2019-01-03 LAB — VITAMIN D 25 HYDROXY (VIT D DEFICIENCY, FRACTURES): VITD: 18.39 ng/mL — ABNORMAL LOW (ref 30.00–100.00)

## 2019-01-03 SURGERY — MASTOPEXY
Anesthesia: General | Site: Breast | Laterality: Bilateral

## 2019-01-03 MED ORDER — SUGAMMADEX SODIUM 200 MG/2ML IV SOLN
INTRAVENOUS | Status: DC | PRN
  Administered 2019-01-03: 200 mg via INTRAVENOUS

## 2019-01-03 MED ORDER — BUPIVACAINE HCL (PF) 0.25 % IJ SOLN
INTRAMUSCULAR | Status: DC | PRN
  Administered 2019-01-03: 30 mL

## 2019-01-03 MED ORDER — MIDAZOLAM HCL 2 MG/2ML IJ SOLN: 1.0000 mg | INTRAMUSCULAR | Status: DC | PRN

## 2019-01-03 MED ORDER — CELECOXIB 200 MG PO CAPS
200.0000 mg | ORAL_CAPSULE | ORAL | Status: AC
  Administered 2019-01-03: 200 mg via ORAL

## 2019-01-03 MED ORDER — ACETAMINOPHEN 500 MG PO TABS
1000.0000 mg | ORAL_TABLET | ORAL | Status: AC
  Administered 2019-01-03: 1000 mg via ORAL

## 2019-01-03 MED ORDER — FENTANYL CITRATE (PF) 100 MCG/2ML IJ SOLN: 50.0000 ug | INTRAMUSCULAR | Status: DC | PRN

## 2019-01-03 MED ORDER — MIDAZOLAM HCL 5 MG/5ML IJ SOLN
INTRAMUSCULAR | Status: DC | PRN
  Administered 2019-01-03: 2 mg via INTRAVENOUS

## 2019-01-03 MED ORDER — DEXAMETHASONE SODIUM PHOSPHATE 4 MG/ML IJ SOLN
INTRAMUSCULAR | Status: DC | PRN
  Administered 2019-01-03: 10 mg via INTRAVENOUS

## 2019-01-03 MED ORDER — HYDROMORPHONE HCL 1 MG/ML IJ SOLN: 0.2500 mg | INTRAMUSCULAR | Status: DC | PRN

## 2019-01-03 MED ORDER — HEPARIN SODIUM (PORCINE) 5000 UNIT/ML IJ SOLN: 5000.0000 [IU] | Freq: Once | INTRAMUSCULAR | Status: DC

## 2019-01-03 MED ORDER — LACTATED RINGERS IV SOLN
INTRAVENOUS | Status: DC
  Administered 2019-01-03 (×2): via INTRAVENOUS

## 2019-01-03 MED ORDER — DEXAMETHASONE SODIUM PHOSPHATE 10 MG/ML IJ SOLN
INTRAMUSCULAR | Status: AC
  Filled 2019-01-03: qty 1

## 2019-01-03 MED ORDER — CELECOXIB 200 MG PO CAPS
ORAL_CAPSULE | ORAL | Status: AC
  Filled 2019-01-03: qty 1

## 2019-01-03 MED ORDER — ROCURONIUM BROMIDE 10 MG/ML (PF) SYRINGE
PREFILLED_SYRINGE | INTRAVENOUS | Status: AC
  Filled 2019-01-03: qty 10

## 2019-01-03 MED ORDER — FENTANYL CITRATE (PF) 100 MCG/2ML IJ SOLN
INTRAMUSCULAR | Status: AC
  Filled 2019-01-03: qty 2

## 2019-01-03 MED ORDER — GABAPENTIN 300 MG PO CAPS
300.0000 mg | ORAL_CAPSULE | ORAL | Status: AC
  Administered 2019-01-03: 300 mg via ORAL

## 2019-01-03 MED ORDER — EPHEDRINE SULFATE 50 MG/ML IJ SOLN
INTRAMUSCULAR | Status: DC | PRN
  Administered 2019-01-03: 10 mg via INTRAVENOUS

## 2019-01-03 MED ORDER — HYDROCODONE-ACETAMINOPHEN 5-325 MG PO TABS: 1.0000 | ORAL_TABLET | ORAL | 0 refills | Status: DC | PRN

## 2019-01-03 MED ORDER — EPHEDRINE 5 MG/ML INJ
INTRAVENOUS | Status: AC
  Filled 2019-01-03: qty 10

## 2019-01-03 MED ORDER — CLINDAMYCIN PHOSPHATE 900 MG/50ML IV SOLN
900.0000 mg | INTRAVENOUS | Status: AC
  Administered 2019-01-03: 900 mg via INTRAVENOUS

## 2019-01-03 MED ORDER — ONDANSETRON HCL 4 MG/2ML IJ SOLN
INTRAMUSCULAR | Status: AC
  Filled 2019-01-03: qty 2

## 2019-01-03 MED ORDER — PHENYLEPHRINE HCL-NACL 10-0.9 MG/250ML-% IV SOLN
INTRAVENOUS | Status: DC | PRN
  Administered 2019-01-03: 25 ug/min via INTRAVENOUS

## 2019-01-03 MED ORDER — CHLORHEXIDINE GLUCONATE CLOTH 2 % EX PADS: 6.0000 | MEDICATED_PAD | Freq: Once | CUTANEOUS | Status: DC

## 2019-01-03 MED ORDER — ROCURONIUM BROMIDE 100 MG/10ML IV SOLN
INTRAVENOUS | Status: DC | PRN
  Administered 2019-01-03: 80 mg via INTRAVENOUS

## 2019-01-03 MED ORDER — MEPERIDINE HCL 25 MG/ML IJ SOLN: 6.2500 mg | INTRAMUSCULAR | Status: DC | PRN

## 2019-01-03 MED ORDER — 0.9 % SODIUM CHLORIDE (POUR BTL) OPTIME
TOPICAL | Status: DC | PRN
  Administered 2019-01-03: 700 mL

## 2019-01-03 MED ORDER — PHENYLEPHRINE 40 MCG/ML (10ML) SYRINGE FOR IV PUSH (FOR BLOOD PRESSURE SUPPORT)
PREFILLED_SYRINGE | INTRAVENOUS | Status: AC
  Filled 2019-01-03: qty 20

## 2019-01-03 MED ORDER — ONDANSETRON HCL 4 MG/2ML IJ SOLN: 4.0000 mg | Freq: Once | INTRAMUSCULAR | Status: DC | PRN

## 2019-01-03 MED ORDER — CHOLECALCIFEROL 1.25 MG (50000 UT) PO CAPS: 50000.0000 [IU] | ORAL_CAPSULE | ORAL | 1 refills | Status: DC

## 2019-01-03 MED ORDER — LIDOCAINE HCL (CARDIAC) PF 100 MG/5ML IV SOSY
PREFILLED_SYRINGE | INTRAVENOUS | Status: DC | PRN
  Administered 2019-01-03: 75 mg via INTRAVENOUS

## 2019-01-03 MED ORDER — LIDOCAINE 2% (20 MG/ML) 5 ML SYRINGE
INTRAMUSCULAR | Status: AC
  Filled 2019-01-03: qty 5

## 2019-01-03 MED ORDER — PHENYLEPHRINE HCL (PRESSORS) 10 MG/ML IV SOLN
INTRAVENOUS | Status: DC | PRN
  Administered 2019-01-03 (×6): 80 ug via INTRAVENOUS

## 2019-01-03 MED ORDER — CLINDAMYCIN PHOSPHATE 900 MG/50ML IV SOLN
INTRAVENOUS | Status: AC
  Filled 2019-01-03: qty 50

## 2019-01-03 MED ORDER — HEPARIN SODIUM (PORCINE) 5000 UNIT/ML IJ SOLN
INTRAMUSCULAR | Status: AC
  Filled 2019-01-03: qty 1

## 2019-01-03 MED ORDER — PROPOFOL 10 MG/ML IV BOLUS
INTRAVENOUS | Status: DC | PRN
  Administered 2019-01-03: 130 mg via INTRAVENOUS
  Administered 2019-01-03: 70 mg via INTRAVENOUS

## 2019-01-03 MED ORDER — ONDANSETRON HCL 4 MG/2ML IJ SOLN
INTRAMUSCULAR | Status: DC | PRN
  Administered 2019-01-03: 4 mg via INTRAVENOUS

## 2019-01-03 MED ORDER — ACETAMINOPHEN 500 MG PO TABS
ORAL_TABLET | ORAL | Status: AC
  Filled 2019-01-03: qty 2

## 2019-01-03 MED ORDER — MIDAZOLAM HCL 2 MG/2ML IJ SOLN
INTRAMUSCULAR | Status: AC
  Filled 2019-01-03: qty 2

## 2019-01-03 MED ORDER — GABAPENTIN 300 MG PO CAPS
ORAL_CAPSULE | ORAL | Status: AC
  Filled 2019-01-03: qty 1

## 2019-01-03 MED ORDER — PROPOFOL 10 MG/ML IV BOLUS
INTRAVENOUS | Status: AC
  Filled 2019-01-03: qty 20

## 2019-01-03 SURGICAL SUPPLY — 61 items
ADH SKN CLS APL DERMABOND .7 (GAUZE/BANDAGES/DRESSINGS) ×2
BAG DECANTER FOR FLEXI CONT (MISCELLANEOUS) IMPLANT
BINDER BREAST LRG (GAUZE/BANDAGES/DRESSINGS) IMPLANT
BINDER BREAST MEDIUM (GAUZE/BANDAGES/DRESSINGS) IMPLANT
BINDER BREAST XLRG (GAUZE/BANDAGES/DRESSINGS) IMPLANT
BINDER BREAST XXLRG (GAUZE/BANDAGES/DRESSINGS) ×2 IMPLANT
BLADE SURG 10 STRL SS (BLADE) ×4 IMPLANT
BLADE SURG 15 STRL LF DISP TIS (BLADE) IMPLANT
BLADE SURG 15 STRL SS (BLADE)
BNDG GAUZE ELAST 4 BULKY (GAUZE/BANDAGES/DRESSINGS) ×4 IMPLANT
CANISTER SUCT 1200ML W/VALVE (MISCELLANEOUS) ×2 IMPLANT
CHLORAPREP W/TINT 26 (MISCELLANEOUS) ×4 IMPLANT
COVER MAYO STAND REUSABLE (DRAPES) ×2 IMPLANT
COVER WAND RF STERILE (DRAPES) IMPLANT
DECANTER SPIKE VIAL GLASS SM (MISCELLANEOUS) IMPLANT
DERMABOND ADVANCED (GAUZE/BANDAGES/DRESSINGS) ×2
DERMABOND ADVANCED .7 DNX12 (GAUZE/BANDAGES/DRESSINGS) ×2 IMPLANT
DRAIN CHANNEL 15F RND FF W/TCR (WOUND CARE) IMPLANT
DRAIN CHANNEL 19F RND (DRAIN) IMPLANT
DRAPE HALF SHEET 70X43 (DRAPES) ×4 IMPLANT
DRAPE TOP ARMCOVERS (MISCELLANEOUS) ×2 IMPLANT
DRAPE U-SHAPE 76X120 STRL (DRAPES) ×2 IMPLANT
DRAPE UTILITY XL STRL (DRAPES) ×2 IMPLANT
DRSG PAD ABDOMINAL 8X10 ST (GAUZE/BANDAGES/DRESSINGS) ×4 IMPLANT
DRSG TEGADERM 2-3/8X2-3/4 SM (GAUZE/BANDAGES/DRESSINGS) IMPLANT
ELECT BLADE 4.0 EZ CLEAN MEGAD (MISCELLANEOUS)
ELECT COATED BLADE 2.86 ST (ELECTRODE) ×2 IMPLANT
ELECT REM PT RETURN 9FT ADLT (ELECTROSURGICAL) ×2
ELECTRODE BLDE 4.0 EZ CLN MEGD (MISCELLANEOUS) IMPLANT
ELECTRODE REM PT RTRN 9FT ADLT (ELECTROSURGICAL) ×1 IMPLANT
EVACUATOR SILICONE 100CC (DRAIN) IMPLANT
GAUZE SPONGE 4X4 12PLY STRL (GAUZE/BANDAGES/DRESSINGS) IMPLANT
GAUZE SPONGE 4X4 12PLY STRL LF (GAUZE/BANDAGES/DRESSINGS) IMPLANT
GLOVE BIO SURGEON STRL SZ 6 (GLOVE) ×4 IMPLANT
GOWN STRL REUS W/ TWL LRG LVL3 (GOWN DISPOSABLE) ×2 IMPLANT
GOWN STRL REUS W/TWL LRG LVL3 (GOWN DISPOSABLE) ×2
MARKER SKIN DUAL TIP RULER LAB (MISCELLANEOUS) IMPLANT
NEEDLE HYPO 25X1 1.5 SAFETY (NEEDLE) ×2 IMPLANT
NS IRRIG 1000ML POUR BTL (IV SOLUTION) ×2 IMPLANT
PACK BASIN DAY SURGERY FS (CUSTOM PROCEDURE TRAY) ×2 IMPLANT
PENCIL SMOKE EVACUATOR (MISCELLANEOUS) ×2 IMPLANT
PIN SAFETY STERILE (MISCELLANEOUS) IMPLANT
SLEEVE SCD COMPRESS KNEE MED (MISCELLANEOUS) ×2 IMPLANT
SPONGE LAP 18X18 RF (DISPOSABLE) ×6 IMPLANT
STAPLER VISISTAT 35W (STAPLE) ×2 IMPLANT
STRIP CLOSURE SKIN 1/2X4 (GAUZE/BANDAGES/DRESSINGS) IMPLANT
SUT ETHILON 2 0 FS 18 (SUTURE) ×2 IMPLANT
SUT MNCRL AB 4-0 PS2 18 (SUTURE) ×6 IMPLANT
SUT PDS AB 2-0 CT2 27 (SUTURE) IMPLANT
SUT VIC AB 3-0 PS1 18 (SUTURE) ×5
SUT VIC AB 3-0 PS1 18XBRD (SUTURE) ×5 IMPLANT
SUT VIC AB 3-0 SH 27 (SUTURE)
SUT VIC AB 3-0 SH 27X BRD (SUTURE) IMPLANT
SUT VICRYL 4-0 PS2 18IN ABS (SUTURE) ×4 IMPLANT
SYR BULB IRRIGATION 50ML (SYRINGE) ×2 IMPLANT
SYR CONTROL 10ML LL (SYRINGE) ×2 IMPLANT
TAPE MEASURE VINYL STERILE (MISCELLANEOUS) ×2 IMPLANT
TOWEL GREEN STERILE FF (TOWEL DISPOSABLE) ×2 IMPLANT
TUBE CONNECTING 20X1/4 (TUBING) ×2 IMPLANT
UNDERPAD 30X36 HEAVY ABSORB (UNDERPADS AND DIAPERS) ×4 IMPLANT
YANKAUER SUCT BULB TIP NO VENT (SUCTIONS) ×2 IMPLANT

## 2019-01-03 NOTE — Discharge Instructions (Signed)
° ° ° °  JP Drain Totals °· Bring this sheet to all of your post-operative appointments while you have your drains. °· Please measure your drains by CC's or ML's. °· Make sure you drain and measure your JP Drains 2 or 3 times per day. °· At the end of each day, add up totals for the left side and add up totals for the right side. °   ( 9 am )     ( 3 pm )        ( 9 pm )                °Date L  R  L  R  L  R  Total L/R  °               °               °               °               °               °               °               °               °               °               °               °               ° ° ° °Post Anesthesia Home Care Instructions ° °Activity: °Get plenty of rest for the remainder of the day. A responsible individual must stay with you for 24 hours following the procedure.  °For the next 24 hours, DO NOT: °-Drive a car °-Operate machinery °-Drink alcoholic beverages °-Take any medication unless instructed by your physician °-Make any legal decisions or sign important papers. ° °Meals: °Start with liquid foods such as gelatin or soup. Progress to regular foods as tolerated. Avoid greasy, spicy, heavy foods. If nausea and/or vomiting occur, drink only clear liquids until the nausea and/or vomiting subsides. Call your physician if vomiting continues. ° °Special Instructions/Symptoms: °Your throat may feel dry or sore from the anesthesia or the breathing tube placed in your throat during surgery. If this causes discomfort, gargle with warm salt water. The discomfort should disappear within 24 hours. ° °If you had a scopolamine patch placed behind your ear for the management of post- operative nausea and/or vomiting: ° °1. The medication in the patch is effective for 72 hours, after which it should be removed.  Wrap patch in a tissue and discard in the trash. Wash hands thoroughly with soap and water. °2. You may remove the patch earlier than 72 hours if you experience unpleasant side effects which  may include dry mouth, dizziness or visual disturbances. °3. Avoid touching the patch. Wash your hands with soap and water after contact with the patch. °  ° °

## 2019-01-03 NOTE — Op Note (Signed)
Operative Note   DATE OF OPERATION: 12.11.20  LOCATION: [facility]-outpatient  SURGICAL DIVISION: Plastic Surgery  PREOPERATIVE DIAGNOSES:  1. Post operative asymmetry breasts 2. History right breast cancer 3. History therapeutic radiation  POSTOPERATIVE DIAGNOSES:  same  PROCEDURE:  Bilateral breast mastopexy  SURGEON: Irene Limbo MD MBA  ASSISTANT: none  ANESTHESIA:  General.   EBL: 50 ml  COMPLICATIONS: None immediate.   INDICATIONS FOR PROCEDURE:  The patient, Christie Ramirez, is a 63 y.o. female born on February 04, 1955, is here for treatment asymmetry breasts following right lumpectomy and adjuvant radiation for cancer   FINDINGS: Right mastopexy skin only 5 g, left mastopexy 254 g  DESCRIPTION OF PROCEDURE:  The patient was marked standing in the preoperative area to mark sternal notch, chest midline, anterior axillary lines, inframammary folds. The location of new nipple areolar complex was marked at level of inframammary fold on anterior surface breast by palpation. This was marked symmetric over bilateral breasts. With aid of Wise pattern marker, location of new nipple areolar complex and vertical limbs (7cm) were markedby displacement of breasts along meridian.The patient was taken to the operating room. SCDs were placedand IV antibiotics were given. The patient's operative site was prepped and draped in a sterile fashion. A time out was performed and all information was confirmed to be correct.   Over left breast, superomedial pedicle marked and nipple areolar complex marked with101mm diameter marker. Pedicle deepithlialized and developedto chest wall. Breast tissue resected over lower pole breast and additional tissue superior pole. Medial and lateral flaps developed.Breast tailor tacked closed.  I then directed attention to right breast where NAC marked with 38 mm marker. Superior pedicle designed, and the pedicle was deepithelialized. Pedicle developed  until tension free movement was possible. Medial and lateral flaps developed. Breast tailor tacked closed, and patient brought to upright sitting position and assessed for symmetry. Patent returned to supine position and breast cavities irrigated and hemostasis obtained. Local anesthetic infiltrated throughout each breast. 15 Fr JP placed in left breast and secured with 2-0 nylon. Closure completed bilateralwith 3-0 vicryl to approximate dermis along inframammary fold and vertical limb. NAC inset with 4-0 vicryl in dermis. Skin closure completed with 4-0 monocryl subcuticular throughout.Tissue adhesive applied. Dry dressing and breast binder applied  The patient was allowed to wake from anesthesia, extubated and taken to the recovery room in satisfactory condition.   SPECIMENS: right and left mastopexy  DRAINS: 15 Fr JP in left breast

## 2019-01-03 NOTE — Anesthesia Procedure Notes (Signed)
Procedure Name: Intubation Date/Time: 01/03/2019 10:13 AM Performed by: Marrianne Mood, CRNA Pre-anesthesia Checklist: Patient identified, Emergency Drugs available, Suction available, Patient being monitored and Timeout performed Patient Re-evaluated:Patient Re-evaluated prior to induction Oxygen Delivery Method: Circle system utilized Preoxygenation: Pre-oxygenation with 100% oxygen Induction Type: IV induction Ventilation: Mask ventilation without difficulty Laryngoscope Size: Mac, 3 and Glidescope Grade View: Grade II Tube type: Oral Tube size: 7.0 mm Number of attempts: 1 Airway Equipment and Method: Stylet and Oral airway Placement Confirmation: ETT inserted through vocal cords under direct vision,  positive ETCO2 and breath sounds checked- equal and bilateral Secured at: 21 cm Tube secured with: Tape Dental Injury: Teeth and Oropharynx as per pre-operative assessment  Difficulty Due To: Difficulty was anticipated

## 2019-01-03 NOTE — Transfer of Care (Signed)
Immediate Anesthesia Transfer of Care Note  Patient: Christie Ramirez  Procedure(s) Performed: MASTOPEXY (Bilateral Breast)  Patient Location: PACU  Anesthesia Type:General  Level of Consciousness: awake and patient cooperative  Airway & Oxygen Therapy: Patient Spontanous Breathing and Patient connected to face mask oxygen  Post-op Assessment: Report given to RN and Post -op Vital signs reviewed and stable  Post vital signs: Reviewed and stable  Last Vitals:  Vitals Value Taken Time  BP 131/71 01/03/19 1256  Temp    Pulse 115 01/03/19 1257  Resp 22 01/03/19 1257  SpO2 95 % 01/03/19 1257  Vitals shown include unvalidated device data.  Last Pain:  Vitals:   01/03/19 0904  TempSrc: Tympanic  PainSc: 0-No pain      Patients Stated Pain Goal: 9 ([id number])  Complications: No apparent anesthesia complications

## 2019-01-03 NOTE — H&P (Signed)
Subjective:     Patient ID: Christie Ramirez is a 63 y.o. female.  HPI  Presents for bilateral breast mastopexy. Underwent right lumpectomy, SLN 2015 for final T1b,N0,M0; ER/PR +, Her 2 neu -. On [place].  MMG 10/2018 benign, clinically mass palpable noted left chest. US showed left superior chest wall palpable mass, at 11:30 o'clock, suggestive of EIC Scheduled with Dr. Marlou Starks for left breast cyst excision 11.9.20  Prior to lumpectomy 36 D. Current- wearing same bra but likely larger on left, in part due to 20 lb Wt gain since start [place].   PMH significant for OSA  Lives with husband. 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 Prior to this worked at [employer] and [employer].   Review of Systems Remainder 12 point review negative    Objective:   Physical Exam  Cardiovascular: Normal rate. Normal heart sounds Pulmonary/Chest: Effort normal. Clear to auscultation. Abdominal: Soft.  Lymphadenopathy:    She has no axillary adenopathy.  Neurological: She is alert and oriented to person, place, and time.  Skin:  Fitzpatrick 2    Left upper chest mass - cyst No masses right breast upper pole transverse scar Left> right volume Right grade 1 left grade 2 ptosis SN to nipple R 23 L 27 cm BW R 19 L 22 cm Nipple to IMF 8 L 10 cm    Assessment:     History right breast ca, s/p lumpectomy, SLN Adjuvant radiation Post operative asymmetry breasts    Plan:     Patient with history breast cancer now with asymmetry following lumpectomy and radiation. Plan mastopexy/reductionwith anchor type scars, possible drains. Reviewed post operative visits and limitations, recovery. Diminished sensation nipple and breast skin, risk of nipple loss, wound healing problems, asymmetry, incidental carcinoma, changes with wt gain/loss, aging, unacceptable cosmetic appearance reviewed.Reviewed increased risks in setting of radiation, including wound healing problems, fat necrosis.Reviewed with  agingleftbreast will not develop as much ptosis,rightwill develop recurrent ptosis sooner than right.Reviewed that I cannot assure her cup size.  Plan OP surgery.

## 2019-01-03 NOTE — Anesthesia Postprocedure Evaluation (Signed)
Anesthesia Post Note  Patient: Christie Ramirez  Procedure(s) Performed: MASTOPEXY (Bilateral Breast)     Patient location during evaluation: PACU Anesthesia Type: General Level of consciousness: awake and alert Pain management: pain level controlled Vital Signs Assessment: post-procedure vital signs reviewed and stable Respiratory status: spontaneous breathing, nonlabored ventilation, respiratory function stable and patient connected to nasal cannula oxygen Cardiovascular status: blood pressure returned to baseline and stable Postop Assessment: no apparent nausea or vomiting Anesthetic complications: no    Last Vitals:  Vitals:   01/03/19 1415 01/03/19 1422  BP: 126/85   Pulse: (!) 105 (!) 102  Resp: (!) 9 10  Temp:    SpO2: 96% 94%    Last Pain:  Vitals:   01/03/19 1400  TempSrc:   PainSc: 6                  Avarey Yaeger DAVID

## 2019-01-03 NOTE — Anesthesia Preprocedure Evaluation (Signed)
Anesthesia Evaluation  Patient identified by MRN, date of birth, ID band Patient awake    Reviewed: Allergy & Precautions, NPO status , Patient's Chart, lab work & pertinent test results  Airway Mallampati: I  TM Distance: >3 FB Neck ROM: Full    Dental   Pulmonary    Pulmonary exam normal        Cardiovascular hypertension, Pt. on medications Normal cardiovascular exam     Neuro/Psych Anxiety Depression    GI/Hepatic GERD  Medicated and Controlled,  Endo/Other    Renal/GU      Musculoskeletal   Abdominal   Peds  Hematology   Anesthesia Other Findings   Reproductive/Obstetrics                             Anesthesia Physical Anesthesia Plan  ASA: II  Anesthesia Plan: General   Post-op Pain Management:    Induction: Intravenous  PONV Risk Score and Plan: 3 and Ondansetron, Midazolam and Dexamethasone  Airway Management Planned: Oral ETT  Additional Equipment:   Intra-op Plan:   Post-operative Plan: Extubation in OR  Informed Consent: I have reviewed the patients History and Physical, chart, labs and discussed the procedure including the risks, benefits and alternatives for the proposed anesthesia with the patient or authorized representative who has indicated his/her understanding and acceptance.       Plan Discussed with: CRNA and Surgeon  Anesthesia Plan Comments:         Anesthesia Quick Evaluation

## 2019-01-05 ENCOUNTER — Other Ambulatory Visit: Payer: Self-pay | Admitting: Family Medicine

## 2019-01-05 DIAGNOSIS — R109 Unspecified abdominal pain: Secondary | ICD-10-CM

## 2019-01-06 ENCOUNTER — Encounter: Payer: Self-pay | Admitting: *Deleted

## 2019-01-06 ENCOUNTER — Other Ambulatory Visit: Payer: 59

## 2019-01-07 ENCOUNTER — Ambulatory Visit: Payer: 59 | Admitting: Podiatry

## 2019-01-09 ENCOUNTER — Other Ambulatory Visit: Payer: Self-pay

## 2019-01-09 ENCOUNTER — Encounter: Payer: Self-pay | Admitting: Gastroenterology

## 2019-01-09 ENCOUNTER — Ambulatory Visit: Payer: 59 | Admitting: Gastroenterology

## 2019-01-09 VITALS — BP 114/71 | HR 85 | Temp 97.9°F | Ht 61.0 in | Wt 168.4 lb

## 2019-01-09 DIAGNOSIS — R109 Unspecified abdominal pain: Secondary | ICD-10-CM

## 2019-01-09 DIAGNOSIS — D509 Iron deficiency anemia, unspecified: Secondary | ICD-10-CM

## 2019-01-09 LAB — SURGICAL PATHOLOGY

## 2019-01-09 MED ORDER — PANTOPRAZOLE SODIUM 40 MG PO TBEC: 40.0000 mg | DELAYED_RELEASE_TABLET | Freq: Every day | ORAL | 6 refills | Status: DC

## 2019-01-09 NOTE — Progress Notes (Signed)
Primary Care Physician: Leone Haven, MD  Primary Gastroenterologist:  Dr. Lucilla Lame  Chief Complaint  Patient presents with  . Abdominal Pain    HPI: Christie Ramirez is a 63 y.o. female here for follow-up after having a colonoscopy.  The patient now reports that she has some right-sided pain that happens probably once a month.  The patient denies any foods making the pain any better or worse.  She does report that she has been taking anti-inflammatory medications recently after having some breast surgery.  The patient states that she has been having approximately 1 month of black soft stools.  There is no report of any bright red blood per rectum dizziness shortness of breath or weakness.  The patient states her right-sided abdominal pain is in the mid abdominal region.  Current Outpatient Medications  Medication Sig Dispense Refill  . albuterol (PROVENTIL HFA;VENTOLIN HFA) 108 (90 Base) MCG/ACT inhaler Inhale 2 puffs into the lungs every 6 (six) hours as needed for wheezing or shortness of breath. 1 Inhaler 0  . amLODipine (NORVASC) 5 MG tablet 10 mg.   2  . aspirin 81 MG tablet Take 81 mg by mouth daily.    Marland Kitchen atorvastatin (LIPITOR) 40 MG tablet TAKE 1 TABLET (40 MG TOTAL) BY MOUTH DAILY. 90 tablet 0  . CALCIUM-MAGNESIUM-ZINC PO Take by mouth 3 (three) times daily.    . cetirizine (ZYRTEC) 10 MG tablet SMARTSIG:1 Pill By Mouth Daily PRN    . Cholecalciferol (VITAMIN D-3 PO) Take 1,000 Units by mouth.    . Cholecalciferol 1.25 MG (50000 UT) capsule Take 1 capsule (50,000 Units total) by mouth once a week. 13 capsule 1  . clonazePAM (KLONOPIN) 1 MG tablet Take 1 tablet (1 mg total) by mouth daily as needed for anxiety. 30 tablet 2  . escitalopram (LEXAPRO) 10 MG tablet Take 1 tablet (10 mg total) by mouth daily. 90 tablet 0  . ezetimibe (ZETIA) 10 MG tablet Take 1 tablet (10 mg total) by mouth daily. 90 tablet 1  . fluticasone (FLONASE) 50 MCG/ACT nasal spray Place 2 sprays  into both nostrils daily.    Marland Kitchen gabapentin (NEURONTIN) 100 MG capsule Take 1 capsule (100 mg total) by mouth at bedtime. 30 capsule 2  . GARLIC PO Take 0000000 mg by mouth.    Marland Kitchen HYDROcodone-acetaminophen (NORCO/VICODIN) 5-325 MG tablet Take 1 tablet by mouth every 4 (four) hours as needed for moderate pain. 25 tablet 0  . levothyroxine (SYNTHROID) 175 MCG tablet Take 1 tablet (175 mcg total) by mouth daily before breakfast. 90 tablet 1  . losartan (COZAAR) 25 MG tablet Take 4 tablets (100 mg total) by mouth daily. 360 tablet 0  . Multiple Vitamins-Minerals (MULTIVITAMIN WITH MINERALS) tablet Take 1 tablet by mouth daily.    Marland Kitchen omeprazole (PRILOSEC) 20 MG capsule TAKE 1 CAPSULE BY MOUTH EVERY DAY 90 capsule 1  . pantoprazole (PROTONIX) 40 MG tablet Take 1 tablet (40 mg total) by mouth daily. 30 tablet 6  . raloxifene (EVISTA) 60 MG tablet Take 1 tablet (60 mg total) by mouth daily. 90 tablet 1  . traZODone (DESYREL) 50 MG tablet TAKE 2 TABLETS (100 MG TOTAL) BY MOUTH AT BEDTIME AS NEEDED FOR SLEEP. 180 tablet 0  . vitamin C (ASCORBIC ACID) 500 MG tablet Take 500 mg by mouth daily.     No current facility-administered medications for this visit.    Allergies as of 01/09/2019 - Review Complete 01/03/2019  Allergen Reaction Noted  .  Codeine Anaphylaxis 10/12/2010  . Iodinated diagnostic agents Hives 10/12/2010  . Iodine Swelling 11/25/2018  . Penicillins Rash 10/12/2010    ROS:  General: Negative for anorexia, weight loss, fever, chills, fatigue, weakness. ENT: Negative for hoarseness, difficulty swallowing , nasal congestion. CV: Negative for chest pain, angina, palpitations, dyspnea on exertion, peripheral edema.  Respiratory: Negative for dyspnea at rest, dyspnea on exertion, cough, sputum, wheezing.  GI: See history of present illness. GU:  Negative for dysuria, hematuria, urinary incontinence, urinary frequency, nocturnal urination.  Endo: Negative for unusual weight change.    Physical  Examination:   BP 114/71   Pulse 85   Temp 97.9 F (36.6 C) (Oral)   Ht 5\' 1"  (1.549 m)   Wt 168 lb 6.4 oz (76.4 kg)   BMI 31.82 kg/m   General: Well-nourished, well-developed in no acute distress.  Eyes: No icterus. Conjunctivae pink. Mouth: Oropharyngeal mucosa moist and pink , no lesions erythema or exudate. Lungs: Clear to auscultation bilaterally. Non-labored. Heart: Regular rate and rhythm, no murmurs rubs or gallops.  Abdomen: Bowel sounds are normal, positive tenderness to 1 finger palpation while flexing the abdominal wall muscles, nondistended, no hepatosplenomegaly or masses, no abdominal bruits or hernia , no rebound or guarding.   Extremities: No lower extremity edema. No clubbing or deformities. Neuro: Alert and oriented x 3.  Grossly intact. Skin: Warm and dry, no jaundice.   Psych: Alert and cooperative, normal mood and affect.  Labs:    Imaging Studies: No results found.  Assessment and Plan:   Christie Ramirez is a 63 y.o. y/o female who comes in today with abdominal pain and black stools.  The patient denies taking Pepto-Bismol or iron but does report that she has been taking anti-inflammatory medications daily.  The patient has been told to stop the patient and will have her blood checked today for her hemoglobin and hematocrit.  If the patient is anemic she will need to be set up for an EGD.  The patient will be started prophylactically on Protonix 40 mg a day.  The patient's abdominal pain is musculoskeletal and reproducible with a straight leg lift.  The patient will be notified with the blood work results.  The patient has been explained the plan and agrees with it.     Lucilla Lame, MD. [name]    Note: This dictation was prepared with Dragon dictation along with smaller phrase technology. Any transcriptional errors that result from this process are unintentional.

## 2019-01-10 ENCOUNTER — Telehealth: Payer: Self-pay

## 2019-01-10 LAB — CBC WITH DIFFERENTIAL/PLATELET
Basophils Absolute: 0.1 10*3/uL (ref 0.0–0.2)
Basos: 0 %
EOS (ABSOLUTE): 0.2 10*3/uL (ref 0.0–0.4)
Eos: 1 %
Hematocrit: 38.5 % (ref 34.0–46.6)
Hemoglobin: 12.7 g/dL (ref 11.1–15.9)
Immature Grans (Abs): 0 10*3/uL (ref 0.0–0.1)
Immature Granulocytes: 0 %
Lymphocytes Absolute: 2.8 10*3/uL (ref 0.7–3.1)
Lymphs: 25 %
MCH: 28.5 pg (ref 26.6–33.0)
MCHC: 33 g/dL (ref 31.5–35.7)
MCV: 86 fL (ref 79–97)
Monocytes Absolute: 0.7 10*3/uL (ref 0.1–0.9)
Monocytes: 6 %
Neutrophils Absolute: 7.5 10*3/uL — ABNORMAL HIGH (ref 1.4–7.0)
Neutrophils: 68 %
Platelets: 392 10*3/uL (ref 150–450)
RBC: 4.46 x10E6/uL (ref 3.77–5.28)
RDW: 13.3 % (ref 11.7–15.4)
WBC: 11.2 10*3/uL — ABNORMAL HIGH (ref 3.4–10.8)

## 2019-01-10 NOTE — Telephone Encounter (Signed)
Pt notified of lab results

## 2019-01-10 NOTE — Telephone Encounter (Signed)
-----   Message from Lucilla Lame, MD sent at 01/10/2019  7:44 AM EST ----- Let the patient know the hemoglobin was normal. No need for an EGD at this time.

## 2019-01-13 ENCOUNTER — Telehealth: Payer: Self-pay | Admitting: Family Medicine

## 2019-01-13 ENCOUNTER — Telehealth: Payer: Self-pay

## 2019-01-13 NOTE — Telephone Encounter (Signed)
Pt called and needs a refill on amLODipine (NORVASC) 5 MG tablet sent to [place]

## 2019-01-13 NOTE — Telephone Encounter (Signed)
Endo called stating that patient did not go for her COVID test. Called patient and she states that ginger informed her that her that she did not need to have this done since her hemoglobian was normal. Saw that documented in her chart. Called ENDO and informed them to cancel the procedure.

## 2019-01-14 ENCOUNTER — Encounter: Admission: RE | Payer: Self-pay | Source: Home / Self Care

## 2019-01-14 ENCOUNTER — Ambulatory Visit: Admission: RE | Admit: 2019-01-14 | Payer: 59 | Source: Home / Self Care | Admitting: Gastroenterology

## 2019-01-14 SURGERY — ESOPHAGOGASTRODUODENOSCOPY (EGD) WITH PROPOFOL
Anesthesia: General

## 2019-01-14 NOTE — Telephone Encounter (Signed)
Pt called to check on status of medication refill

## 2019-01-14 NOTE — Telephone Encounter (Signed)
Voicemail not set up ok for pec to advise.   called to ask the patient what is her dose of the amlopine the provider needs to know to refill.  Felicia Bloomquist,cma

## 2019-01-14 NOTE — Telephone Encounter (Signed)
Please call the patient to confirm her dosing.  It looks like she is getting the 5 mg tablet though it is listed as taking 10 mg daily.  Please confirm that.  Thanks.

## 2019-01-16 ENCOUNTER — Other Ambulatory Visit: Payer: Self-pay | Admitting: Family Medicine

## 2019-01-16 DIAGNOSIS — G4733 Obstructive sleep apnea (adult) (pediatric): Secondary | ICD-10-CM

## 2019-01-16 MED ORDER — AMLODIPINE BESYLATE 5 MG PO TABS: 10.0000 mg | ORAL_TABLET | Freq: Every day | ORAL | 2 refills | Status: DC

## 2019-01-16 NOTE — Telephone Encounter (Signed)
I've sent this to her pharmacy. Please let her know that. Thanks.

## 2019-01-16 NOTE — Telephone Encounter (Signed)
Thank you I will call her!

## 2019-01-16 NOTE — Telephone Encounter (Signed)
Per the patient her dose of Amlodipine is 10 mg. Patient states she is out of medication and having headaches.

## 2019-01-22 ENCOUNTER — Telehealth: Payer: Self-pay | Admitting: Family Medicine

## 2019-01-22 NOTE — Telephone Encounter (Signed)
Changed pharmacy to [place] per patient request.  Nkenge Sonntag,cma

## 2019-01-22 NOTE — Telephone Encounter (Signed)
Pt wants to alert that she is changing her pharmacy to [facility] on [address].

## 2019-02-04 ENCOUNTER — Encounter: Payer: Self-pay | Admitting: Podiatry

## 2019-02-04 ENCOUNTER — Ambulatory Visit (INDEPENDENT_AMBULATORY_CARE_PROVIDER_SITE_OTHER): Payer: Self-pay | Admitting: Podiatry

## 2019-02-04 ENCOUNTER — Other Ambulatory Visit: Payer: Self-pay

## 2019-02-04 DIAGNOSIS — G629 Polyneuropathy, unspecified: Secondary | ICD-10-CM

## 2019-02-04 DIAGNOSIS — M5431 Sciatica, right side: Secondary | ICD-10-CM

## 2019-02-04 DIAGNOSIS — M722 Plantar fascial fibromatosis: Secondary | ICD-10-CM

## 2019-02-04 DIAGNOSIS — M5432 Sciatica, left side: Secondary | ICD-10-CM

## 2019-02-04 MED ORDER — GABAPENTIN 300 MG PO CAPS: 300.0000 mg | ORAL_CAPSULE | Freq: Every day | ORAL | 3 refills | Status: DC

## 2019-02-04 MED ORDER — MELOXICAM 15 MG PO TABS: 15.0000 mg | ORAL_TABLET | Freq: Every day | ORAL | 1 refills | Status: DC

## 2019-02-07 NOTE — Progress Notes (Signed)
   Subjective: 64 y.o. female presenting today with a chief complaint of burning, sharp pain to the left foot and heel that began 6-8 months ago. She reprots a previous fracture of the foot five years ago. Wearing tennis shoes helps alleviate the pain. She has been taking Tramadol prescribed by her PCP for pain. There are no worsening factors noted. Patient is here for further evaluation and treatment.   Past Medical History:  Diagnosis Date  . Anxiety   . Breast cancer of upper-inner quadrant of right female breast ([place]) 05/01/2013   Tubular carcinoma, T1b,N0,M0; ER/ PR 90%, her 2 neu not overexpressed.   . Cancer ([place]) 1995   thyroid  . Endometriosis 2009  . Fibula fracture 11/11/2015  . GERD (gastroesophageal reflux disease)   . Hyperlipidemia   . Hypertension   . Hypothyroidism   . Osteopenia    rt hip  . Personal history of radiation therapy 2015   mammosite  . Seasonal allergies   . Sleep apnea    does not wear CPAP     Objective: Physical Exam General: The patient is alert and oriented x3 in no acute distress.  Dermatology: Skin is warm, dry and supple bilateral lower extremities. Negative for open lesions or macerations bilateral.   Vascular: Dorsalis Pedis and Posterior Tibial pulses palpable bilateral.  Capillary fill time is immediate to all digits.  Neurological: Epicritic and protective threshold intact bilateral.   Musculoskeletal: Tenderness to palpation to the plantar aspect of the left heel along the plantar fascia. All other joints range of motion within normal limits bilateral. Strength 5/5 in all groups bilateral.   Assessment: 1. Plantar fasciitis left foot 2. Sciatica  3. Peripheral polyneuropathy   Plan of Care:  1. Patient evaluated.    2. Prescription for Meloxicam provided to patient. 3. Continue taking Gabapentin every night at bedtime. Increase dose to 300 mg qhs.  4. Continue taking Tramadol as directed by PCP.  5. Return to clinic in 6  weeks.   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     Edrick Kins, DPM [facility]  Dr. Edrick Kins, DPM    [address], [place] [address]                Office [phone]  Fax [phone]

## 2019-03-05 ENCOUNTER — Telehealth: Payer: Self-pay | Admitting: Family Medicine

## 2019-03-05 ENCOUNTER — Other Ambulatory Visit: Payer: Self-pay

## 2019-03-05 ENCOUNTER — Ambulatory Visit (INDEPENDENT_AMBULATORY_CARE_PROVIDER_SITE_OTHER): Payer: 59 | Admitting: Family Medicine

## 2019-03-05 DIAGNOSIS — G8929 Other chronic pain: Secondary | ICD-10-CM

## 2019-03-05 DIAGNOSIS — R7303 Prediabetes: Secondary | ICD-10-CM

## 2019-03-05 DIAGNOSIS — I10 Essential (primary) hypertension: Secondary | ICD-10-CM | POA: Diagnosis not present

## 2019-03-05 DIAGNOSIS — F419 Anxiety disorder, unspecified: Secondary | ICD-10-CM

## 2019-03-05 DIAGNOSIS — R490 Dysphonia: Secondary | ICD-10-CM

## 2019-03-05 DIAGNOSIS — M25512 Pain in left shoulder: Secondary | ICD-10-CM | POA: Diagnosis not present

## 2019-03-05 DIAGNOSIS — E559 Vitamin D deficiency, unspecified: Secondary | ICD-10-CM

## 2019-03-05 MED ORDER — LOSARTAN POTASSIUM 100 MG PO TABS: 100.0000 mg | ORAL_TABLET | Freq: Every day | ORAL | 1 refills | Status: DC

## 2019-03-05 NOTE — Assessment & Plan Note (Signed)
Check A1c.  Continue exercise. 

## 2019-03-05 NOTE — Assessment & Plan Note (Signed)
This continues to be an issue.  She will try to track down the MRI report for [place].  She will complete exercises at home.  Discussed the potential for an injection depending on what the MRI reveals.

## 2019-03-05 NOTE — Telephone Encounter (Signed)
Lvm to set up labs/shingrix and 4 month PCP follow up

## 2019-03-05 NOTE — Assessment & Plan Note (Signed)
Well-controlled.  Rarely takes clonazepam.  Discussed risk of drowsiness, addiction, and dependence on this medication.  Advised not to drive if she becomes drowsy with this.  She will continue Lexapro.

## 2019-03-05 NOTE — Progress Notes (Signed)
Virtual Visit via video Note  This visit type was conducted due to national recommendations for restrictions regarding the COVID-19 pandemic (e.g. social distancing).  This format is felt to be most appropriate for this patient at this time.  All issues noted in this document were discussed and addressed.  No physical exam was performed (except for noted visual exam findings with Video Visits).   I connected with Christie Ramirez today at  8:30 AM EST by a video enabled telemedicine application and verified that I am speaking with the correct person using two identifiers. Location patient: home Location provider: work  Persons participating in the virtual visit: patient, provider  I discussed the limitations, risks, security and privacy concerns of performing an evaluation and management service by telephone and the availability of in person appointments. I also discussed with the patient that there may be a patient responsible charge related to this service. The patient expressed understanding and agreed to proceed.  Reason for visit: follow-up  HPI: Hypertension: Typically 125-128/79.  Taking amlodipine and losartan.  No chest pain or shortness of breath.  Has been exercising more getting 8-10,000 steps daily.  Hoarseness: She saw ENT and was advised this was allergies.  She is on cetirizine and that has been helpful.  Left shoulder pain: Patient notes this has been going on for at least 6 months.  It is occurring fairly consistently and hurts all the time now.  She had an MRI when she was in [place] and was advised there were calcifications somewhere in her shoulder.  She was told there was no cancer.  Tylenol does not help.  Ice does provide some benefit.  She wonders about a steroid injection.  Anxiety: Patient notes this is good.  She has no depression.  She takes Lexapro daily.  She rarely takes the clonazepam and when she does she takes half a tablet.  No drowsiness.  No  alcohol intake.  Vitamin D deficiency: She is taking vitamin D daily.  Prediabetes: She has been exercising and monitoring her diet.   ROS: See pertinent positives and negatives per HPI.  Past Medical History:  Diagnosis Date  . Anxiety   . Breast cancer of upper-inner quadrant of right female breast ([place]) 05/01/2013   Tubular carcinoma, T1b,N0,M0; ER/ PR 90%, her 2 neu not overexpressed.   . Cancer ([place]) 1995   thyroid  . Endometriosis 2009  . Fibula fracture 11/11/2015  . GERD (gastroesophageal reflux disease)   . Hyperlipidemia   . Hypertension   . Hypothyroidism   . Osteopenia    rt hip  . Personal history of radiation therapy 2015   mammosite  . Seasonal allergies   . Sleep apnea    does not wear CPAP    Past Surgical History:  Procedure Laterality Date  . ABDOMINAL HYSTERECTOMY  2005   Martin DeFrancesco,MD  . BREAST BIOPSY Right 04-02-13   INVASIVE MAMMARY CARCINOMA WITH FEATURES OF TUBULAR CARCINOMA,  . BREAST CYST EXCISION Left 12/02/2018   Procedure: EXCISION LEFT BREAST SEBACEOUS CYST;  Surgeon: Jovita Kussmaul, MD;  Location: [facility];  Service: General;  Laterality: Left;  . BREAST LUMPECTOMY Right 04/2013   INVASIVE MAMMARY CARCINOMA WITH FEATURES OF TUBULAR CARCINOMA,  . BREAST SURGERY Right 05/01/13   wide excision  . colectomy  2009   secondary to endometriosis DR. Smith  . 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 No malignancy.  Marland Kitchen  COLONOSCOPY  2009   Dr. Tamala Julian  . COLONOSCOPY WITH PROPOFOL N/A 11/19/2018   Procedure: COLONOSCOPY WITH PROPOFOL;  Surgeon: Lucilla Lame, MD;  Location: [facility] ENDOSCOPY;  Service: Endoscopy;  Laterality: N/A;  . FOOT SURGERY  right  . MASTOPEXY Bilateral 01/03/2019   Procedure: MASTOPEXY;  Surgeon: Irene Limbo, MD;  Location: [facility];  Service: Plastics;  Laterality: Bilateral;    Family History  Problem Relation Age of Onset  .  Colon polyps Father        age 64  . Heart disease Father   . Bone cancer Father   . Heart disease Mother   . Rheumatic fever Mother   . Heart disease Paternal Grandmother   . Heart disease Paternal Grandfather   . Cancer Neg Hx   . Diabetes Neg Hx   . Breast cancer Neg Hx     SOCIAL HX: Non-smoker   Current Outpatient Medications:  .  albuterol (PROVENTIL HFA;VENTOLIN HFA) 108 (90 Base) MCG/ACT inhaler, Inhale 2 puffs into the lungs every 6 (six) hours as needed for wheezing or shortness of breath., Disp: 1 Inhaler, Rfl: 0 .  amLODipine (NORVASC) 5 MG tablet, Take 2 tablets (10 mg total) by mouth daily., Disp: 180 tablet, Rfl: 2 .  aspirin 81 MG tablet, Take 81 mg by mouth daily., Disp: , Rfl:  .  atorvastatin (LIPITOR) 40 MG tablet, TAKE 1 TABLET (40 MG TOTAL) BY MOUTH DAILY., Disp: 90 tablet, Rfl: 0 .  CALCIUM-MAGNESIUM-ZINC PO, Take by mouth 3 (three) times daily., Disp: , Rfl:  .  cetirizine (ZYRTEC) 10 MG tablet, SMARTSIG:1 Pill By Mouth Daily PRN, Disp: , Rfl:  .  Cholecalciferol (VITAMIN D-3 PO), Take 1,000 Units by mouth., Disp: , Rfl:  .  Cholecalciferol 1.25 MG (50000 UT) capsule, Take 1 capsule (50,000 Units total) by mouth once a week., Disp: 13 capsule, Rfl: 1 .  clonazePAM (KLONOPIN) 1 MG tablet, Take 1 tablet (1 mg total) by mouth daily as needed for anxiety., Disp: 30 tablet, Rfl: 2 .  escitalopram (LEXAPRO) 10 MG tablet, Take 1 tablet (10 mg total) by mouth daily., Disp: 90 tablet, Rfl: 0 .  ezetimibe (ZETIA) 10 MG tablet, Take 1 tablet (10 mg total) by mouth daily., Disp: 90 tablet, Rfl: 1 .  fluticasone (FLONASE) 50 MCG/ACT nasal spray, Place 2 sprays into both nostrils daily., Disp: , Rfl:  .  gabapentin (NEURONTIN) 300 MG capsule, Take 1 capsule (300 mg total) by mouth at bedtime., Disp: 90 capsule, Rfl: 3 .  GARLIC PO, Take 563 mg by mouth., Disp: , Rfl:  .  HYDROcodone-acetaminophen (NORCO/VICODIN) 5-325 MG tablet, Take 1 tablet by mouth every 4 (four) hours as  needed for moderate pain., Disp: 25 tablet, Rfl: 0 .  levothyroxine (SYNTHROID) 175 MCG tablet, Take 1 tablet (175 mcg total) by mouth daily before breakfast., Disp: 90 tablet, Rfl: 1 .  losartan (COZAAR) 25 MG tablet, Take 4 tablets (100 mg total) by mouth daily., Disp: 360 tablet, Rfl: 0 .  meloxicam (MOBIC) 15 MG tablet, Take 1 tablet (15 mg total) by mouth daily., Disp: 30 tablet, Rfl: 1 .  Multiple Vitamins-Minerals (MULTIVITAMIN WITH MINERALS) tablet, Take 1 tablet by mouth daily., Disp: , Rfl:  .  omeprazole (PRILOSEC) 20 MG capsule, TAKE 1 CAPSULE BY MOUTH EVERY DAY, Disp: 90 capsule, Rfl: 1 .  pantoprazole (PROTONIX) 40 MG tablet, Take 1 tablet (40 mg total) by mouth daily., Disp: 30 tablet, Rfl: 6 .  raloxifene (EVISTA)  60 MG tablet, Take 1 tablet (60 mg total) by mouth daily., Disp: 90 tablet, Rfl: 1 .  traZODone (DESYREL) 50 MG tablet, TAKE 2 TABLETS (100 MG TOTAL) BY MOUTH AT BEDTIME AS NEEDED FOR SLEEP., Disp: 180 tablet, Rfl: 0 .  vitamin C (ASCORBIC ACID) 500 MG tablet, Take 500 mg by mouth daily., Disp: , Rfl:   EXAM:  VITALS per patient if applicable:  GENERAL: alert, oriented, appears well and in no acute distress  HEENT: atraumatic, conjunttiva clear, no obvious abnormalities on inspection of external nose and ears  NECK: normal movements of the head and neck  LUNGS: on inspection no signs of respiratory distress, breathing rate appears normal, no obvious gross SOB, gasping or wheezing  CV: no obvious cyanosis  MS: moves all visible extremities without noticeable abnormality  PSYCH/NEURO: pleasant and cooperative, no obvious depression or anxiety, speech and thought processing grossly intact  ASSESSMENT AND PLAN:  Discussed the following assessment and plan:  Hypertension Adequately controlled.  Continue current regimen.  She will come in for labs.  Anxiety Well-controlled.  Rarely takes clonazepam.  Discussed risk of drowsiness, addiction, and dependence on  this medication.  Advised not to drive if she becomes drowsy with this.  She will continue Lexapro.  Hoarseness Related to allergies.  She will continue cetirizine.  Left shoulder pain This continues to be an issue.  She will try to track down the MRI report for [place].  She will complete exercises at home.  Discussed the potential for an injection depending on what the MRI reveals.  Prediabetes Check A1c.  Continue exercise.  Vitamin D deficiency Check vitamin D.   Orders Placed This Encounter  Procedures  . Comp Met (CMET)    Standing Status:   Future    Standing Expiration Date:   03/04/2020  . HgB A1c    Standing Status:   Future    Standing Expiration Date:   03/04/2020  . Vitamin D (25 hydroxy)    Standing Status:   Future    Standing Expiration Date:   03/04/2020    No orders of the defined types were placed in this encounter.    I discussed the assessment and treatment plan with the patient. The patient was provided an opportunity to ask questions and all were answered. The patient agreed with the plan and demonstrated an understanding of the instructions.   The patient was advised to call back or seek an in-person evaluation if the symptoms worsen or if the condition fails to improve as anticipated.   [name], MD

## 2019-03-05 NOTE — Assessment & Plan Note (Signed)
Adequately controlled.  Continue current regimen.  She will come in for labs.

## 2019-03-05 NOTE — Assessment & Plan Note (Signed)
Check vitamin D. 

## 2019-03-05 NOTE — Addendum Note (Signed)
Addended by: Fulton Mole D on: 03/05/2019 09:32 AM   Modules accepted: Orders

## 2019-03-05 NOTE — Patient Instructions (Signed)
Secondary Shoulder Impingement Syndrome Rehab Ask your health care provider which exercises are safe for you. Do exercises exactly as told by your health care provider and adjust them as directed. It is normal to feel mild stretching, pulling, tightness, or discomfort as you do these exercises. Stop right away if you feel sudden pain or your pain gets worse. Do not begin these exercises until told by your health care provider. Stretching and range-of-motion exercise This exercise warms up your muscles and joints and improves the movement and flexibility of your neck and shoulder. This exercise also helps to relieve pain and stiffness. Cervical side bend  1. Using good posture, sit on a stable chair, or stand up. 2. Without moving your shoulders, slowly tilt your left / right ear toward your left / right shoulder until you feel a stretch in your neck (cervical) muscles on the other side. You should be looking straight ahead. 3. Hold for __________ seconds. 4. Slowly return to the starting position. 5. Repeat the stretch on your left / right side. Repeat __________ times. Complete this exercise __________ times a day. Strengthening exercises These exercises build strength and endurance in your shoulder. Endurance is the ability to use your muscles for a long time, even after they get tired. Scapular protraction, supine  1. Lie on your back on a firm surface (supine position). Hold a __________ weight in your left / right hand. 2. Raise your left / right arm straight into the air so your hand is directly above your shoulder joint. 3. Push the weight into the air so your shoulder (scapula) lifts off the surface that you are lying on. The scapula will push up or forward (protraction). Do not move your head, neck, or back. 4. Hold for __________ seconds. 5. Slowly return to the starting position. Let your muscles relax completely before you repeat this exercise. Repeat __________ times. Complete this  exercise __________ times a day. Scapular retraction  1. Sit in a stable chair without armrests, or stand up. 2. Secure an exercise band to a stable object in front of you so the band is at shoulder height. 3. Hold one end of the exercise band in each hand. Your palms should face down. 4. Squeeze your shoulder blades (scapulae) together and move your elbows slightly behind you (retraction). Do not shrug your shoulders upward while you do this. 5. Hold for __________ seconds. 6. Slowly return to the starting position. Repeat __________ times. Complete this exercise __________ times a day. Shoulder extension with scapular retraction  1. Sit in a stable chair without armrests, or stand up. 2. Secure an exercise band to a stable object in front of you so the band is above shoulder height. 3. Hold one end of the exercise band in each hand. 4. Straighten your elbows and lift your hands up to shoulder height. 5. Squeeze your shoulder blades together (scapular retraction) and pull your hands down to the sides of your thighs (shoulder extension). Stop when your hands are straight down by your sides. Do not let your hands go behind your body. 6. Hold for __________ seconds. 7. Slowly return to the starting position. Repeat __________ times. Complete this exercise __________ times a day. Shoulder abduction 1. Sit in a stable chair without armrests, or stand up. 2. If directed, hold a __________ weight in your left / right hand. 3. Start with your arms straight down. Turn your left / right hand so your palm faces in, toward your body. 4. Slowly  lift your left / right hand out to your side (abduction). Do not lift your hand above shoulder height. ? Keep your arms straight. ? Avoid shrugging your shoulder while you do this movement. Keep your shoulder blade tucked down toward the middle of your back. 5. Hold for __________ seconds. 6. Slowly lower your arm, and return to the starting position. Repeat  __________ times. Complete this exercise __________ times a day. This information is not intended to replace advice given to you by your health care provider. Make sure you discuss any questions you have with your health care provider. Document Revised: 05/03/2018 Document Reviewed: 02/14/2018 Elsevier Patient Education  [place].

## 2019-03-05 NOTE — Assessment & Plan Note (Signed)
Related to allergies.  She will continue cetirizine.

## 2019-03-06 ENCOUNTER — Ambulatory Visit: Payer: 59

## 2019-03-10 ENCOUNTER — Other Ambulatory Visit: Payer: Self-pay | Admitting: Internal Medicine

## 2019-03-10 ENCOUNTER — Other Ambulatory Visit: Payer: Self-pay

## 2019-03-10 DIAGNOSIS — F411 Generalized anxiety disorder: Secondary | ICD-10-CM

## 2019-03-10 DIAGNOSIS — F419 Anxiety disorder, unspecified: Secondary | ICD-10-CM

## 2019-03-10 DIAGNOSIS — F329 Major depressive disorder, single episode, unspecified: Secondary | ICD-10-CM

## 2019-03-10 MED ORDER — ESCITALOPRAM OXALATE 10 MG PO TABS: 10.0000 mg | ORAL_TABLET | Freq: Every day | ORAL | 0 refills | Status: DC

## 2019-03-10 NOTE — Telephone Encounter (Signed)
Pt needs refill of lexapro 10 mg daily   Please fill

## 2019-03-10 NOTE — Addendum Note (Signed)
Addended by: Fulton Mole D on: 03/10/2019 02:41 PM   Modules accepted: Orders

## 2019-03-18 ENCOUNTER — Other Ambulatory Visit: Payer: Self-pay

## 2019-03-18 ENCOUNTER — Ambulatory Visit: Payer: 59 | Admitting: Podiatry

## 2019-03-18 ENCOUNTER — Encounter: Payer: Self-pay | Admitting: Podiatry

## 2019-03-18 DIAGNOSIS — G629 Polyneuropathy, unspecified: Secondary | ICD-10-CM

## 2019-03-18 DIAGNOSIS — M5431 Sciatica, right side: Secondary | ICD-10-CM

## 2019-03-18 DIAGNOSIS — M722 Plantar fascial fibromatosis: Secondary | ICD-10-CM

## 2019-03-18 DIAGNOSIS — M5432 Sciatica, left side: Secondary | ICD-10-CM

## 2019-03-24 NOTE — Progress Notes (Signed)
   Subjective: 65 y.o. female presenting today for follow up evaluation of plantar fasciitis of the left foot. She states the pain had gotten better until about 4-5 days ago. She reports the burning, sharp pain has returned. Walking and being on the foot increases the pain. She has been taking Tramadol and Meloxicam for treatment. Patient is here for further evaluation and treatment.   Past Medical History:  Diagnosis Date  . Anxiety   . 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   . Cancer ([place]) 1995   thyroid  . Endometriosis 2009  . Fibula fracture 11/11/2015  . GERD (gastroesophageal reflux disease)   . Hyperlipidemia   . Hypertension   . Hypothyroidism   . Osteopenia    rt hip  . Personal history of radiation therapy 2015   mammosite  . Seasonal allergies   . Sleep apnea    does not wear CPAP     Objective: Physical Exam General: The patient is alert and oriented x3 in no acute distress.  Dermatology: Skin is warm, dry and supple bilateral lower extremities. Negative for open lesions or macerations bilateral.   Vascular: Dorsalis Pedis and Posterior Tibial pulses palpable bilateral.  Capillary fill time is immediate to all digits.  Neurological: Epicritic and protective threshold intact bilateral.   Musculoskeletal: Tenderness to palpation to the plantar aspect of the left heel along the plantar fascia. All other joints range of motion within normal limits bilateral. Strength 5/5 in all groups bilateral.   Assessment: 1. Plantar fasciitis left foot 2. Sciatica  3. Peripheral polyneuropathy   Plan of Care:  1. Patient evaluated.    2. Injection of 0.5 mLs Celestone Soluspan injected into the left heel.  3. Continue taking Meloxicam.  4. Continue taking Gabapentin 300 mg QHS.  5. Continue taking Tramadol as directed by PCP.  6. Return to clinic in 6 weeks.     Recently got laid off. Interviewed for a company called [employer]. Did not get the job.    Edrick Kins, DPM [facility]  Dr. Edrick Kins, DPM    [address], [place] [address]                Office [phone]  Fax [phone]

## 2019-04-08 ENCOUNTER — Other Ambulatory Visit: Payer: Self-pay | Admitting: Family Medicine

## 2019-04-15 ENCOUNTER — Other Ambulatory Visit: Payer: Self-pay | Admitting: Family Medicine

## 2019-04-15 ENCOUNTER — Telehealth: Payer: Self-pay | Admitting: Family Medicine

## 2019-04-15 NOTE — Telephone Encounter (Signed)
Pt said she had to switch her pharmacy to [facility] and she need the following prescriptions sent to them because they are expired.  levothyroxine Losartan-already at [facility] Atorvastatin Trazodone Ezetimibe Amlodipine

## 2019-04-16 ENCOUNTER — Other Ambulatory Visit: Payer: Self-pay

## 2019-04-16 ENCOUNTER — Other Ambulatory Visit: Payer: Self-pay | Admitting: Family Medicine

## 2019-04-16 DIAGNOSIS — I10 Essential (primary) hypertension: Secondary | ICD-10-CM

## 2019-04-16 DIAGNOSIS — E785 Hyperlipidemia, unspecified: Secondary | ICD-10-CM

## 2019-04-16 DIAGNOSIS — E890 Postprocedural hypothyroidism: Secondary | ICD-10-CM

## 2019-04-16 DIAGNOSIS — G4700 Insomnia, unspecified: Secondary | ICD-10-CM

## 2019-04-16 MED ORDER — EZETIMIBE 10 MG PO TABS: 10.0000 mg | ORAL_TABLET | Freq: Every day | ORAL | 1 refills | Status: DC

## 2019-04-16 MED ORDER — TRAZODONE HCL 50 MG PO TABS: 100.0000 mg | ORAL_TABLET | Freq: Every evening | ORAL | 0 refills | Status: DC | PRN

## 2019-04-16 NOTE — Telephone Encounter (Signed)
Medications were sent to the patients new pharmacy per her request.  Baruc Tugwell,cma

## 2019-04-29 ENCOUNTER — Other Ambulatory Visit: Payer: Self-pay

## 2019-04-29 ENCOUNTER — Ambulatory Visit (INDEPENDENT_AMBULATORY_CARE_PROVIDER_SITE_OTHER): Payer: No Typology Code available for payment source | Admitting: Podiatry

## 2019-04-29 DIAGNOSIS — M722 Plantar fascial fibromatosis: Secondary | ICD-10-CM

## 2019-04-29 DIAGNOSIS — G629 Polyneuropathy, unspecified: Secondary | ICD-10-CM

## 2019-04-29 DIAGNOSIS — M5431 Sciatica, right side: Secondary | ICD-10-CM

## 2019-04-29 DIAGNOSIS — M5432 Sciatica, left side: Secondary | ICD-10-CM

## 2019-04-29 MED ORDER — MELOXICAM 7.5 MG PO TABS: 7.5000 mg | ORAL_TABLET | Freq: Every day | ORAL | 1 refills | Status: DC

## 2019-04-30 MED ORDER — ATORVASTATIN CALCIUM 40 MG PO TABS: 40.0000 mg | ORAL_TABLET | Freq: Every day | ORAL | 0 refills | Status: DC

## 2019-04-30 NOTE — Telephone Encounter (Signed)
Patient is requesting a refill on Lipitor can I send it or does she needs labs first.  Christie Ramirez,cma

## 2019-04-30 NOTE — Telephone Encounter (Signed)
Pt called Christie Ramirez never received the rx for atorvastatin (LIPITOR) 40 MG tablet and pt is out

## 2019-04-30 NOTE — Addendum Note (Signed)
Addended by: Caryl Bis, Nicole Hafley G on: 04/30/2019 12:30 PM   Modules accepted: Orders

## 2019-04-30 NOTE — Telephone Encounter (Addendum)
Sent to pharmacy.  She does need to be scheduled for labs.

## 2019-04-30 NOTE — Telephone Encounter (Signed)
I called the patient and LVM stating that her medication was sent to the pharmacy and she needed to call back and schedule labs.  Quinton Voth,cma

## 2019-05-01 ENCOUNTER — Telehealth: Payer: Self-pay | Admitting: Family Medicine

## 2019-05-01 DIAGNOSIS — E890 Postprocedural hypothyroidism: Secondary | ICD-10-CM

## 2019-05-01 NOTE — Progress Notes (Signed)
   Subjective: 64 y.o. female presenting today for follow up evaluation of plantar fasciitis of the left foot and neuropathy. She states the pain is the same and has not improved. The injection did not provide any relief. She has been taking Meloxicam and Gabapentin for treatment with minimal relief. Walking and being on the foot increases the pain. Patient is here for further evaluation and treatment.   Past Medical History:  Diagnosis Date  . Anxiety   . Breast cancer of upper-inner quadrant of right female breast ([place]) 05/01/2013   Tubular carcinoma, T1b,N0,M0; ER/ PR 90%, her 2 neu not overexpressed.   . Cancer ([place]) 1995   thyroid  . Endometriosis 2009  . Fibula fracture 11/11/2015  . GERD (gastroesophageal reflux disease)   . Hyperlipidemia   . Hypertension   . Hypothyroidism   . Osteopenia    rt hip  . Personal history of radiation therapy 2015   mammosite  . Seasonal allergies   . Sleep apnea    does not wear CPAP     Objective: Physical Exam General: The patient is alert and oriented x3 in no acute distress.  Dermatology: Skin is warm, dry and supple bilateral lower extremities. Negative for open lesions or macerations bilateral.   Vascular: Dorsalis Pedis and Posterior Tibial pulses palpable bilateral.  Capillary fill time is immediate to all digits.  Neurological: Epicritic and protective threshold intact bilateral.   Musculoskeletal: Tenderness to palpation to the plantar aspect of the left heel along the plantar fascia. All other joints range of motion within normal limits bilateral. Strength 5/5 in all groups bilateral.   Assessment: 1. Plantar fasciitis left foot  2. Sciatica  3. Peripheral polyneuropathy   Plan of Care:  1. Patient evaluated.    2. Injection of 0.5 mLs Celestone Soluspan injected into the left heel.  3. Plantar fascial brace dispensed.  4. Continue taking Gabapentin 300 mg QHS.  5. Refill prescription for Meloxicam provided to  patient.  6. Recommended shoes from [employer] in [place].  7. Return to clinic in 6 weeks.   Recently got laid off. Interviewed for a company called [employer]. Did not get the job.    Edrick Kins, DPM [facility]  Dr. Edrick Kins, DPM    [address], [place] [address]                Office [phone]  Fax [phone]

## 2019-05-01 NOTE — Addendum Note (Signed)
Addended by: Fulton Mole D on: 05/01/2019 03:29 PM   Modules accepted: Orders

## 2019-05-01 NOTE — Telephone Encounter (Signed)
Per patient request I ordered her TSH in labs she is having labs done Monday.  Ardys Hataway,cma

## 2019-05-01 NOTE — Telephone Encounter (Signed)
Pt called she would like to have TSH added to her labs on Monday since it has been 48m

## 2019-05-01 NOTE — Telephone Encounter (Signed)
I ordered the tsh per the patient's request.  Josehua Hammar,cma

## 2019-05-05 ENCOUNTER — Other Ambulatory Visit (INDEPENDENT_AMBULATORY_CARE_PROVIDER_SITE_OTHER): Payer: No Typology Code available for payment source

## 2019-05-05 ENCOUNTER — Other Ambulatory Visit: Payer: Self-pay

## 2019-05-05 DIAGNOSIS — I10 Essential (primary) hypertension: Secondary | ICD-10-CM

## 2019-05-05 DIAGNOSIS — E559 Vitamin D deficiency, unspecified: Secondary | ICD-10-CM

## 2019-05-05 DIAGNOSIS — E890 Postprocedural hypothyroidism: Secondary | ICD-10-CM | POA: Diagnosis not present

## 2019-05-05 DIAGNOSIS — R7303 Prediabetes: Secondary | ICD-10-CM | POA: Diagnosis not present

## 2019-05-05 LAB — COMPREHENSIVE METABOLIC PANEL
ALT: 28 U/L (ref 0–35)
AST: 15 U/L (ref 0–37)
Albumin: 4.2 g/dL (ref 3.5–5.2)
Alkaline Phosphatase: 95 U/L (ref 39–117)
BUN: 23 mg/dL (ref 6–23)
CO2: 22 mEq/L (ref 19–32)
Calcium: 9.2 mg/dL (ref 8.4–10.5)
Chloride: 107 mEq/L (ref 96–112)
Creatinine, Ser: 0.63 mg/dL (ref 0.40–1.20)
GFR: 95.55 mL/min (ref >60.00)
Glucose, Bld: 103 mg/dL — ABNORMAL HIGH (ref 70–99)
Potassium: 4.2 mEq/L (ref 3.5–5.1)
Sodium: 138 mEq/L (ref 135–145)
Total Bilirubin: 0.5 mg/dL (ref 0.2–1.2)
Total Protein: 6.6 g/dL (ref 6.0–8.3)

## 2019-05-05 LAB — HEMOGLOBIN A1C: Hgb A1c MFr Bld: 6.2 % (ref 4.6–6.5)

## 2019-05-05 LAB — VITAMIN D 25 HYDROXY (VIT D DEFICIENCY, FRACTURES): VITD: 21.46 ng/mL — ABNORMAL LOW (ref 30.00–100.00)

## 2019-05-05 LAB — TSH: TSH: 0.46 u[IU]/mL (ref 0.35–4.50)

## 2019-05-06 ENCOUNTER — Other Ambulatory Visit: Payer: Self-pay

## 2019-05-06 ENCOUNTER — Telehealth: Payer: Self-pay | Admitting: Family Medicine

## 2019-05-06 DIAGNOSIS — E890 Postprocedural hypothyroidism: Secondary | ICD-10-CM

## 2019-05-06 MED ORDER — LEVOTHYROXINE SODIUM 175 MCG PO TABS: 175.0000 ug | ORAL_TABLET | Freq: Every day | ORAL | 1 refills | Status: DC

## 2019-05-06 NOTE — Telephone Encounter (Signed)
Pt called to get lab results °

## 2019-05-07 NOTE — Telephone Encounter (Signed)
I called and gave lab results to the patient on 05/06/2019.  Doreatha Offer,cma

## 2019-06-03 ENCOUNTER — Encounter: Payer: No Typology Code available for payment source | Admitting: Podiatry

## 2019-06-09 ENCOUNTER — Other Ambulatory Visit: Payer: 59

## 2019-06-09 ENCOUNTER — Ambulatory Visit: Payer: 59 | Admitting: Oncology

## 2019-06-24 ENCOUNTER — Telehealth: Payer: Self-pay | Admitting: Oncology

## 2019-06-24 NOTE — Telephone Encounter (Signed)
Patient phoned on this date and stated that she does not have any insurance as she is looking for a job and wanted to reschedule her appt. Appts rescheduled to 09-25-19.

## 2019-06-27 ENCOUNTER — Inpatient Hospital Stay: Payer: Self-pay

## 2019-06-27 ENCOUNTER — Inpatient Hospital Stay: Payer: Self-pay | Admitting: Oncology

## 2019-07-03 ENCOUNTER — Other Ambulatory Visit: Payer: Self-pay | Admitting: Family Medicine

## 2019-07-03 DIAGNOSIS — F419 Anxiety disorder, unspecified: Secondary | ICD-10-CM

## 2019-07-03 DIAGNOSIS — F411 Generalized anxiety disorder: Secondary | ICD-10-CM

## 2019-07-12 ENCOUNTER — Other Ambulatory Visit: Payer: Self-pay | Admitting: Family Medicine

## 2019-07-12 DIAGNOSIS — G4700 Insomnia, unspecified: Secondary | ICD-10-CM

## 2019-07-23 ENCOUNTER — Other Ambulatory Visit: Payer: Self-pay | Admitting: Family Medicine

## 2019-09-25 ENCOUNTER — Ambulatory Visit: Payer: Self-pay | Admitting: Oncology

## 2019-09-25 ENCOUNTER — Other Ambulatory Visit: Payer: Self-pay

## 2019-10-15 ENCOUNTER — Other Ambulatory Visit: Payer: Self-pay | Admitting: Family Medicine

## 2019-10-15 DIAGNOSIS — F419 Anxiety disorder, unspecified: Secondary | ICD-10-CM

## 2019-10-15 DIAGNOSIS — F411 Generalized anxiety disorder: Secondary | ICD-10-CM

## 2019-10-21 ENCOUNTER — Encounter: Payer: Self-pay | Admitting: Family Medicine

## 2019-11-04 ENCOUNTER — Other Ambulatory Visit: Payer: Self-pay | Admitting: Family Medicine

## 2019-11-06 ENCOUNTER — Inpatient Hospital Stay (HOSPITAL_BASED_OUTPATIENT_CLINIC_OR_DEPARTMENT_OTHER): Payer: BC Managed Care – PPO | Admitting: Oncology

## 2019-11-06 ENCOUNTER — Other Ambulatory Visit: Payer: Self-pay

## 2019-11-06 ENCOUNTER — Encounter: Payer: Self-pay | Admitting: Oncology

## 2019-11-06 ENCOUNTER — Inpatient Hospital Stay: Payer: BC Managed Care – PPO | Attending: Oncology

## 2019-11-06 VITALS — BP 135/87 | HR 79 | Temp 97.6°F | Resp 18 | Wt 159.8 lb

## 2019-11-06 DIAGNOSIS — Z9049 Acquired absence of other specified parts of digestive tract: Secondary | ICD-10-CM | POA: Diagnosis not present

## 2019-11-06 DIAGNOSIS — F418 Other specified anxiety disorders: Secondary | ICD-10-CM | POA: Diagnosis not present

## 2019-11-06 DIAGNOSIS — Z79810 Long term (current) use of selective estrogen receptor modulators (SERMs): Secondary | ICD-10-CM | POA: Insufficient documentation

## 2019-11-06 DIAGNOSIS — Z79899 Other long term (current) drug therapy: Secondary | ICD-10-CM | POA: Insufficient documentation

## 2019-11-06 DIAGNOSIS — Z170 Estrogen receptor positive status [ER+]: Secondary | ICD-10-CM | POA: Diagnosis not present

## 2019-11-06 DIAGNOSIS — Z923 Personal history of irradiation: Secondary | ICD-10-CM | POA: Diagnosis not present

## 2019-11-06 DIAGNOSIS — N6322 Unspecified lump in the left breast, upper inner quadrant: Secondary | ICD-10-CM | POA: Insufficient documentation

## 2019-11-06 DIAGNOSIS — C50911 Malignant neoplasm of unspecified site of right female breast: Secondary | ICD-10-CM | POA: Insufficient documentation

## 2019-11-06 DIAGNOSIS — Z853 Personal history of malignant neoplasm of breast: Secondary | ICD-10-CM

## 2019-11-06 DIAGNOSIS — R7401 Elevation of levels of liver transaminase levels: Secondary | ICD-10-CM | POA: Diagnosis not present

## 2019-11-06 DIAGNOSIS — M8580 Other specified disorders of bone density and structure, unspecified site: Secondary | ICD-10-CM | POA: Insufficient documentation

## 2019-11-06 LAB — COMPREHENSIVE METABOLIC PANEL
ALT: 66 U/L — ABNORMAL HIGH (ref 0–44)
AST: 37 U/L (ref 15–41)
Albumin: 4.3 g/dL (ref 3.5–5.0)
Alkaline Phosphatase: 104 U/L (ref 38–126)
Anion gap: 8 (ref 5–15)
BUN: 20 mg/dL (ref 8–23)
CO2: 26 mmol/L (ref 22–32)
Calcium: 8.9 mg/dL (ref 8.9–10.3)
Chloride: 102 mmol/L (ref 98–111)
Creatinine, Ser: 0.7 mg/dL (ref 0.44–1.00)
GFR, Estimated: >60 mL/min (ref >60)
Glucose, Bld: 102 mg/dL — ABNORMAL HIGH (ref 70–99)
Potassium: 4.4 mmol/L (ref 3.5–5.1)
Sodium: 136 mmol/L (ref 135–145)
Total Bilirubin: 1 mg/dL (ref 0.3–1.2)
Total Protein: 7.1 g/dL (ref 6.5–8.1)

## 2019-11-06 LAB — CBC WITH DIFFERENTIAL/PLATELET
Abs Immature Granulocytes: 0.03 10*3/uL (ref 0.00–0.07)
Basophils Absolute: 0.1 10*3/uL (ref 0.0–0.1)
Basophils Relative: 1 %
Eosinophils Absolute: 0.2 10*3/uL (ref 0.0–0.5)
Eosinophils Relative: 2 %
HCT: 39.3 % (ref 36.0–46.0)
Hemoglobin: 12.8 g/dL (ref 12.0–15.0)
Immature Granulocytes: 0 %
Lymphocytes Relative: 34 %
Lymphs Abs: 3.3 10*3/uL (ref 0.7–4.0)
MCH: 28 pg (ref 26.0–34.0)
MCHC: 32.6 g/dL (ref 30.0–36.0)
MCV: 86 fL (ref 80.0–100.0)
Monocytes Absolute: 0.7 10*3/uL (ref 0.1–1.0)
Monocytes Relative: 7 %
Neutro Abs: 5.3 10*3/uL (ref 1.7–7.7)
Neutrophils Relative %: 56 %
Platelets: 322 10*3/uL (ref 150–400)
RBC: 4.57 MIL/uL (ref 3.87–5.11)
RDW: 13.9 % (ref 11.5–15.5)
WBC: 9.5 10*3/uL (ref 4.0–10.5)
nRBC: 0 % (ref 0.0–0.2)

## 2019-11-06 MED ORDER — RALOXIFENE HCL 60 MG PO TABS
60.0000 mg | ORAL_TABLET | Freq: Every day | ORAL | 5 refills | Status: DC
Start: 2019-11-06 — End: 2019-11-07

## 2019-11-06 NOTE — Progress Notes (Signed)
Pt here for follow up. She reports that she has a small nodule to right breast and itchiness & irritation to both nipples.

## 2019-11-06 NOTE — Progress Notes (Signed)
Hematology/Oncology follow up note [facility] Telephone:(336) [phone] Fax:(336) [phone]   Patient Care Team: Leone Haven, MD as PCP - General (Family Medicine) Bary Castilla, Forest Gleason, MD (General Surgery) Jackolyn Confer, MD (Internal Medicine)  REFERRING PROVIDER: Leone Haven, MD  CHIEF COMPLAINTS/REASON FOR VISIT:  Follow up for  history of breast cancer.  HISTORY OF PRESENTING ILLNESS:   Christie Ramirez is a  64 y.o.  female with PMH listed below was seen in consultation at the request of  Leone Haven, MD  for evaluation of history of breast cancer. Patient previously was seen by Dr. Jeb Levering for 1 time.  She wants to reestablish care. Patient was recently seen by Dr. Marlou Starks as she felt a small mass in the upper inner left breast.  The size starts to grow and gets bigger. Extensive medical records reviewed was performed by me. Patient has a history of pT1a pN0 right breast cancer, diagnosed in April 2015, ER/PR positive, HER-2 negative, grade 1, DCIS present, margins negative.  Status post lumpectomy,adjuvant radiation.  Patient was recommended by Dr. Oliva Bustard to start Letrozole. She tried for very short period of time and self stopped.  Per patient she was discharged from cancer center as she was not taking her medication. Patient follows up with her OB/GYN Dr. Enzo Bi, [facility] physician and was prescribed on Raloxifen 67m daily. Per records, she was started on Raloxifen in Feb 2018,   Tolerates well.  Denies any personal history of blood clots. She has history of hysterectomy. Her OB/GYN doctor recently retired.  She wants to establish care with oncology for further management of stage I Patient has anxiety and depression.  She comes to the clinic with her service dog.   INTERVAL HISTORY Christie Ramirez a 64y.o. female who has above history reviewed by me today presents for follow up visit for breast cancer Problems and complaints  are listed below: Patient was seen by me on 12/10/2018 and then lost follow-up.  She presents to reestablish care. She is not taking raloxifene as she ran out of it.  She  reports that she palpates right breast nodule around 11-12:00, lumpectomy sites., further questioning, she reports that the right breast nodule has been present since the previous lumpectomy surgery.  Otherwise no new complaints.  Review of Systems  Constitutional: Negative for appetite change, chills, fatigue and fever.  HENT:   Negative for hearing loss and voice change.   Eyes: Negative for eye problems.  Respiratory: Negative for chest tightness and cough.   Cardiovascular: Negative for chest pain.  Gastrointestinal: Negative for abdominal distention, abdominal pain and blood in stool.  Endocrine: Negative for hot flashes.  Genitourinary: Negative for difficulty urinating and frequency.   Musculoskeletal: Negative for arthralgias.  Skin: Negative for itching and rash.  Neurological: Negative for extremity weakness.  Hematological: Negative for adenopathy.  Psychiatric/Behavioral: Negative for confusion.    MEDICAL HISTORY:  Past Medical History:  Diagnosis Date  . Anxiety   . 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   [place]   [place]  . Endometriosis 2009  . Fibula fracture 11/11/2015  . GERD (gastroesophageal reflux disease)   . Hyperlipidemia   . Hypertension   . Hypothyroidism   . Osteopenia    rt hip  . Personal history of radiation therapy 2015   mammosite  . Seasonal allergies   .  Sleep apnea    does not wear CPAP    SURGICAL HISTORY: Past Surgical History:  Procedure Laterality Date  . ABDOMINAL HYSTERECTOMY  2005   Martin DeFrancesco,MD  . BREAST BIOPSY Right 04-02-13   INVASIVE MAMMARY CARCINOMA WITH FEATURES OF TUBULAR CARCINOMA,  . BREAST CYST EXCISION Left  12/02/2018   Procedure: EXCISION LEFT BREAST SEBACEOUS CYST;  Surgeon: Jovita Kussmaul, MD;  Location: [place];  Service: General;  Laterality: Left;  . BREAST LUMPECTOMY Right 04/2013   INVASIVE MAMMARY CARCINOMA WITH FEATURES OF TUBULAR CARCINOMA,  . BREAST SURGERY Right 05/01/13   wide excision  . colectomy  2009   secondary to endometriosis DR. Smith  . 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 No malignancy.  . COLONOSCOPY  2009   Dr. Tamala Julian  . COLONOSCOPY WITH PROPOFOL N/A 11/19/2018   Procedure: COLONOSCOPY WITH PROPOFOL;  Surgeon: Lucilla Lame, MD;  Location: [facility] END[facility]COPY;  Service: Endoscopy;  Laterality: N/A;  . FOOT SURGERY  right  . MASTOPEXY Bilateral 01/03/2019   Procedure: MASTOPEXY;  Surgeon: Irene Limbo, MD;  Location: [place];  Service: Plastics;  Laterality: Bilateral;    SOCIAL HISTORY: Social History   Socioeconomic History  . Marital status: Married    Spouse name: Not on file  . Number of children: Not on file  . Years of education: Not on file  . Highest education level: Not on file  Occupational History  . Not on file  Tobacco Use  . Smoking status: Never Smoker  . Smokeless tobacco: Never Used  Vaping Use  . Vaping Use: Never used  Substance and Sexual Activity  . Alcohol use: Yes    Alcohol/week: 0.0 standard drinks    Comment: wine socially  . Drug use: No  . Sexual activity: Yes    Birth control/protection: Surgical  Other Topics Concern  . 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 Difficulty of Paying Living Expenses:  Not on file  Food Insecurity:   . Worried About Charity fundraiser in the Last Year: Not on file  . Ran Out of Food in the Last Year: Not on file  Transportation Needs:   . Lack of Transportation (Medical): Not on file  . Lack of Transportation (Non-Medical): Not on file  Physical Activity:   . Days of Exercise per Week: Not on file  . Minutes of Exercise per Session: Not on file  Stress:   . Feeling of Stress : Not on file  Social Connections:   . Frequency of Communication with Friends and Family: Not on file  . Frequency of Social Gatherings with Friends and Family: Not on file  . Attends Religious Services: Not on file  . Active Member of Clubs or Organizations: Not on file  . Attends Archivist Meetings: Not on file  . Marital Status: Not on file  Intimate Partner Violence:   . Fear of Current or Ex-Partner: Not on file  . Emotionally Abused: Not on file  . Physically Abused: Not on file  .  Sexually Abused: Not on file    FAMILY HISTORY: Family History  Problem Relation Age of Onset  . Colon polyps Father        age 7  . Heart disease Father   . Bone cancer Father   . Heart disease Mother   . Rheumatic fever Mother   . Heart disease Paternal Grandmother   . Heart disease Paternal Grandfather   . Cancer Neg Hx   . Diabetes Neg Hx   . Breast cancer Neg Hx     ALLERGIES:  is allergic to codeine, iodinated diagnostic agents, iodine, and penicillins.  MEDICATIONS:  Current Outpatient Medications  Medication Sig Dispense Refill  . albuterol (PROVENTIL HFA;VENTOLIN HFA) 108 (90 Base) MCG/ACT inhaler Inhale 2 puffs into the lungs every 6 (six) hours as needed for wheezing or shortness of breath. 1 Inhaler 0  . amLODipine (NORVASC) 5 MG tablet Take 2 tablets (10 mg total) by mouth daily. 180 tablet 2  . aspirin 81 MG tablet Take 81 mg by mouth daily.    Marland Kitchen atorvastatin (LIPITOR) 40 MG tablet TAKE ONE TABLET BY MOUTH DAILY 90 tablet 0  . CALCIUM-MAGNESIUM-ZINC  PO Take by mouth 3 (three) times daily.    . cetirizine (ZYRTEC) 10 MG tablet SMARTSIG:1 Pill By Mouth Daily PRN    . Cholecalciferol (VITAMIN D-3 PO) Take 1,000 Units by mouth.    . ezetimibe (ZETIA) 10 MG tablet Take 1 tablet (10 mg total) by mouth daily. 90 tablet 1  . GARLIC PO Take 683 mg by mouth.    . levothyroxine (SYNTHROID) 175 MCG tablet Take 1 tablet (175 mcg total) by mouth daily before breakfast. 90 tablet 1  . losartan (COZAAR) 100 MG tablet Take 1 tablet (100 mg total) by mouth daily. 90 tablet 1  . meloxicam (MOBIC) 7.5 MG tablet Take 1 tablet (7.5 mg total) by mouth daily. (Patient taking differently: Take 7.5 mg by mouth daily as needed. ) 60 tablet 1  . Multiple Vitamins-Minerals (MULTIVITAMIN WITH MINERALS) tablet Take 1 tablet by mouth daily.    . traZODone (DESYREL) 50 MG tablet TAKE 2 TABLETS (100 MG TOTAL) BY MOUTH AT BEDTIME AS NEEDED FOR SLEEP. 180 tablet 0  . vitamin C (ASCORBIC ACID) 500 MG tablet Take 500 mg by mouth daily.    . Cholecalciferol 1.25 MG (50000 UT) capsule Take 1 capsule (50,000 Units total) by mouth once a week. (Patient not taking: Reported on 11/06/2019) 13 capsule 1  . clonazePAM (KLONOPIN) 1 MG tablet Take 1 tablet (1 mg total) by mouth daily as needed for anxiety. (Patient not taking: Reported on 11/06/2019) 30 tablet 2  . escitalopram (LEXAPRO) 10 MG tablet TAKE ONE TABLET BY MOUTH DAILY (Patient not taking: Reported on 11/06/2019) 90 tablet 0  . fluticasone (FLONASE) 50 MCG/ACT nasal spray Place 2 sprays into both nostrils daily. (Patient not taking: Reported on 11/06/2019)    . gabapentin (NEURONTIN) 300 MG capsule Take 1 capsule (300 mg total) by mouth at bedtime. (Patient not taking: Reported on 11/06/2019) 90 capsule 3  . HYDROcodone-acetaminophen (NORCO/VICODIN) 5-325 MG tablet Take 1 tablet by mouth every 4 (four) hours as needed for moderate pain. (Patient not taking: Reported on 11/06/2019) 25 tablet 0  . omeprazole (PRILOSEC) 20 MG  capsule TAKE 1 CAPSULE BY MOUTH EVERY DAY (Patient not taking: TAKE 1 CAPSULE BY MOUTH EVERY DAY) 90 capsule 1  . raloxifene (EVISTA) 60 MG tablet Take 1 tablet (60 mg total) by mouth daily. 30 tablet  5   No current facility-administered medications for this visit.     PHYSICAL EXAMINATION: ECOG PERFORMANCE STATUS: 0 - Asymptomatic Vitals:   11/06/19 1027  BP: 135/87  Pulse: 79  Resp: 18  Temp: 97.6 F (36.4 C)   Filed Weights   11/06/19 1027  Weight: 159 lb 12.8 oz (72.5 kg)    Physical Exam Constitutional:      General: She is not in acute distress. HENT:     Head: Normocephalic and atraumatic.  Eyes:     General: No scleral icterus.    Pupils: Pupils are equal, round, and reactive to light.  Cardiovascular:     Rate and Rhythm: Normal rate and regular rhythm.     Heart sounds: Normal heart sounds.  Pulmonary:     Effort: Pulmonary effort is normal. No respiratory distress.     Breath sounds: No wheezing.  Abdominal:     General: Bowel sounds are normal. There is no distension.     Palpations: Abdomen is soft. There is no mass.     Tenderness: There is no abdominal tenderness.  Musculoskeletal:        General: No deformity. Normal range of motion.     Cervical back: Normal range of motion and neck supple.  Skin:    General: Skin is warm and dry.     Findings: No erythema or rash.  Neurological:     Mental Status: She is alert and oriented to person, place, and time.     Cranial Nerves: No cranial nerve deficit.     Coordination: Coordination normal.  Psychiatric:        Behavior: Behavior normal.        Thought Content: Thought content normal.   Breast exam was performed in seated and lying down position. Patient is status post right lumpectomy with a well-healed surgical scar, 11-12 o'clock tissue thickening nodule.  Left breast status post recent epidermal inclusion cyst resection, healing scar. No palpable breast masses bilaterally.  No palpable axillary  lymphadenopathy.   LABORATORY DATA:  I have reviewed the data as listed Lab Results  Component Value Date   WBC 9.5 11/06/2019   HGB 12.8 11/06/2019   HCT 39.3 11/06/2019   MCV 86.0 11/06/2019   PLT 322 11/06/2019   Recent Labs    11/23/18 0050 05/05/19 0943 11/06/19 1005  NA 142 138 136  K 4.0 4.2 4.4  CL 108 107 102  CO2 25 22 26   GLUCOSE 107* 103* 102*  BUN 15 23 20   CREATININE 0.65 0.63 0.70  CALCIUM 9.3 9.2 8.9  GFRNONAA >60  --  >60  GFRAA >60  --   --   PROT 7.5 6.6 7.1  ALBUMIN 4.1 4.2 4.3  AST 16 15 37  ALT 28 28 66*  ALKPHOS 97 95 104  BILITOT 0.6 0.5 1.0   Iron/TIBC/Ferritin/ %Sat No results found for: IRON, TIBC, FERRITIN, IRONPCTSAT    RADIOGRAPHIC STUDIES: I have personally reviewed the radiological images as listed and agreed with the findings in the report.  No results found.    ASSESSMENT & PLAN:  1. History of breast cancer   2. H/O ongoing treatment with raloxifene   3. Elevated alanine aminotransferase (ALT) level    #History of stage I pT1a pN0 right breast cancer, diagnosed in April 2015, ER/PR positive, HER-2 negative, Not able to tolerate aromatase inhibitor.   Patient was previously on raloxifene started by her GYN doctor. I recommend patient to be switched  to tamoxifen which is approved for adjuvant endocrine therapy. Rationale of tamoxifen and duration of the therapy and potential side effects were discussed with patient.  She agrees with the plan.  Elevated ALT, repeat in 4 week.  Osteopenia.  Recommend patient continue calcium and vitamin D supplementation.recommend patient to get bone density done every 2 years for monitoring   Orders Placed This Encounter  Procedures  . MM DIAG BREAST TOMO BILATERAL    Standing Status:   Future    Standing Expiration Date:   11/05/2020    Order Specific Question:   Reason for Exam (SYMPTOM  OR DIAGNOSIS REQUIRED)    Answer:   history of breast cancer    Order Specific Question:    Preferred imaging location?    Answer:   [facility]  . US Breast Limited Uni Right Inc [place]    Standing Status:   Future    Standing Expiration Date:   11/05/2020    Order Specific Question:   Reason for Exam (SYMPTOM  OR DIAGNOSIS REQUIRED)    Answer:   history of breast cancer    Order Specific Question:   Preferred imaging location?    Answer:   [facility]  . US Breast Limited Uni Left Inc [place]    Standing Status:   Future    Standing Expiration Date:   11/05/2020    Order Specific Question:   Reason for Exam (SYMPTOM  OR DIAGNOSIS REQUIRED)    Answer:   history of breast cancer    Order Specific Question:   [facility]    [facility]  . Hepatic function panel    Standing Status:   Future    Standing Expiration Date:   11/05/2020  . CBC with Differential/Platelet    Standing Status:   Future    Standing Expiration Date:   11/05/2020  . Comprehensive metabolic panel    Standing Status:   Future    Standing Expiration Date:   11/05/2020    All questions were answered. The patient knows to call the clinic with any problems questions or concerns.  cc Leone Haven, MD    Return of visit: Follow-up in 6 months. Thank you for this kind referral and the opportunity to participate in the care of this patient. A copy of today's note is routed to referring provider  Total face to face encounter time for this patient visit was 45 min. >50% of the time was  spent in counseling and coordination of care.    Earlie Server, MD, PhD Hematology Oncology [facility] Pager- [phone] 11/06/2019

## 2019-11-07 ENCOUNTER — Encounter: Payer: Self-pay | Admitting: Podiatry

## 2019-11-07 ENCOUNTER — Telehealth: Payer: Self-pay

## 2019-11-07 ENCOUNTER — Other Ambulatory Visit: Payer: Self-pay

## 2019-11-07 ENCOUNTER — Ambulatory Visit (INDEPENDENT_AMBULATORY_CARE_PROVIDER_SITE_OTHER): Payer: BC Managed Care – PPO

## 2019-11-07 ENCOUNTER — Encounter: Payer: Self-pay | Admitting: Family Medicine

## 2019-11-07 ENCOUNTER — Ambulatory Visit (INDEPENDENT_AMBULATORY_CARE_PROVIDER_SITE_OTHER): Payer: No Typology Code available for payment source | Admitting: Podiatry

## 2019-11-07 DIAGNOSIS — Z79810 Long term (current) use of selective estrogen receptor modulators (SERMs): Secondary | ICD-10-CM | POA: Insufficient documentation

## 2019-11-07 DIAGNOSIS — G629 Polyneuropathy, unspecified: Secondary | ICD-10-CM

## 2019-11-07 DIAGNOSIS — D3610 Benign neoplasm of peripheral nerves and autonomic nervous system, unspecified: Secondary | ICD-10-CM

## 2019-11-07 DIAGNOSIS — M205X1 Other deformities of toe(s) (acquired), right foot: Secondary | ICD-10-CM | POA: Diagnosis not present

## 2019-11-07 DIAGNOSIS — M7751 Other enthesopathy of right foot: Secondary | ICD-10-CM

## 2019-11-07 DIAGNOSIS — R7401 Elevation of levels of liver transaminase levels: Secondary | ICD-10-CM | POA: Insufficient documentation

## 2019-11-07 MED ORDER — TAMOXIFEN CITRATE 20 MG PO TABS
20.0000 mg | ORAL_TABLET | Freq: Every day | ORAL | 5 refills | Status: DC
Start: 2019-11-07 — End: 2019-12-05

## 2019-11-07 MED ORDER — TAMOXIFEN CITRATE 20 MG PO TABS: 20.0000 mg | ORAL_TABLET | Freq: Every day | ORAL | 5 refills | Status: DC

## 2019-11-07 NOTE — Patient Instructions (Signed)
Pre-Operative Instructions  Congratulations, you have decided to take an important step towards improving your quality of life.  You can be assured that the doctors and staff at [facility] will be with you every step of the way.  Here are some important things you should know:  1. Plan to be at the surgery center/hospital at least 1 (one) hour prior to your scheduled time, unless otherwise directed by the surgical center/hospital staff.  You must have a responsible adult accompany you, remain during the surgery and drive you home.  Make sure you have directions to the surgical center/hospital to ensure you arrive on time. 2. If you are having surgery at [facility] or [facility] hospitals, you will need a copy of your medical history and physical form from your family physician within one month prior to the date of surgery. We will give you a form for your primary physician to complete.  3. We make every effort to accommodate the date you request for surgery.  However, there are times where surgery dates or times have to be moved.  We will contact you as soon as possible if a change in schedule is required.   4. No aspirin/ibuprofen for one week before surgery.  If you are on aspirin, any non-steroidal anti-inflammatory medications (Mobic, Aleve, Ibuprofen) should not be taken seven (7) days prior to your surgery.  You make take Tylenol for pain prior to surgery.  5. Medications - If you are taking daily heart and blood pressure medications, seizure, reflux, allergy, asthma, anxiety, pain or diabetes medications, make sure you notify the surgery center/hospital before the day of surgery so they can tell you which medications you should take or avoid the day of surgery. 6. No food or drink after midnight the night before surgery unless directed otherwise by surgical center/hospital staff. 7. No alcoholic beverages 24-hours prior to surgery.  No smoking 24-hours prior or 24-hours after  surgery. 8. Wear loose pants or shorts. They should be loose enough to fit over bandages, boots, and casts. 9. Don't wear slip-on shoes. Sneakers are preferred. 10. Bring your boot with you to the surgery center/hospital.  Also bring crutches or a walker if your physician has prescribed it for you.  If you do not have this equipment, it will be provided for you after surgery. 11. If you have not been contacted by the surgery center/hospital by the day before your surgery, call to confirm the date and time of your surgery. 12. Leave-time from work may vary depending on the type of surgery you have.  Appropriate arrangements should be made prior to surgery with your employer. 13. Prescriptions will be provided immediately following surgery by your doctor.  Fill these as soon as possible after surgery and take the medication as directed. Pain medications will not be refilled on weekends and must be approved by the doctor. 14. Remove nail polish on the operative foot and avoid getting pedicures prior to surgery. 15. Wash the night before surgery.  The night before surgery wash the foot and leg well with water and the antibacterial soap provided. Be sure to pay special attention to beneath the toenails and in between the toes.  Wash for at least three (3) minutes. Rinse thoroughly with water and dry well with a towel.  Perform this wash unless told not to do so by your physician.  Enclosed: 1 Ice pack (please put in freezer the night before surgery)   1 Hibiclens skin cleaner     Pre-op instructions  If you have any questions regarding the instructions, please do not hesitate to call our office.  [place]: [address], [place], [place] [address] -- [phone]  [place]: [address] [place], [place] [address] -- [phone]  [place]: [address], [place], [place] [address] -- [phone]   Website: [email or web address] 

## 2019-11-07 NOTE — Progress Notes (Signed)
HPI: 64 y.o. female presenting today for evaluation of right foot pain.  Patient actually has 2 complaints associated to her right foot.  First the patient states that approximately 3 days ago she sustained an injury to the right second toe.  Is been very painful ever since.  She has been taking ibuprofen, meloxicam, wearing shoes that helped minimally with the pain.  She states that the second toe is very painful to bend.  She denies injury or trauma to the area.  Patient's next complaint is associated to a chronic pain and achiness to the right great toe joint.  Patient does have history of cheilectomy surgery to the right great toe joint several years ago.  She states that over time progressively she has had increased pain and tenderness with walking to the right great toe joint.  She presents for further treatment and evaluation  Past Medical History:  Diagnosis Date  . Anxiety   . Breast cancer of upper-inner quadrant of right female breast ([place]) 05/01/2013   Tubular carcinoma, T1b,N0,M0; ER/ PR 90%, her 2 neu not overexpressed.   . Cancer ([place]) 1995   thyroid  . Endometriosis 2009  . Fibula fracture 11/11/2015  . GERD (gastroesophageal reflux disease)   . Hyperlipidemia   . Hypertension   . Hypothyroidism   . Osteopenia    rt hip  . Personal history of radiation therapy 2015   mammosite  . Seasonal allergies   . Sleep apnea    does not wear CPAP     Physical Exam: General: The patient is alert and oriented x3 in no acute distress.  Dermatology: Skin is warm, dry and supple bilateral lower extremities. Negative for open lesions or macerations.  Vascular: Palpable pedal pulses bilaterally. No edema or erythema noted. Capillary refill within normal limits.  Neurological: Epicritic and protective threshold grossly intact bilaterally.   Musculoskeletal Exam: Range of motion within normal limits to all pedal and ankle joints bilateral. Muscle strength 5/5 in all groups  bilateral.  Pain on palpation range of motion the second MTPJ of the right foot.  There is also pain on palpation range of motion the first MTPJ of the right foot.  Clinical evidence of hypertrophic spurring noted to the first MTPJ.  Limited range of motion to the first MTPJ consistent with findings of hallux limitus  Radiographic Exam:  Normal osseous mineralization.  Joint space degenerative changes and periarticular spurring noted to the first MTPJ of the right foot  Assessment: 1.  Chronic symptomatic hallux limitus right 2.  Second MTPJ capsulitis right   Plan of Care:  1. Patient evaluated. X-Rays reviewed.  2.  Injection of 0.5 cc Celestone Soluspan injected in the first MTPJ of the right foot 3.  Postsurgical shoe dispensed.  Wear daily x3 weeks 4. Today we discussed the conservative versus surgical management of the presenting pathology. The patient opts for surgical management. All possible complications and details of the procedure were explained. All patient questions were answered. No guarantees were expressed or implied. 5. Authorization for surgery was initiated today. Surgery will consist of right great toe arthroplasty with implant 6.  Return to clinic 1 week postop  *Culinary analyst/catering coordinator at [facility], DPM [facility]  Dr. Edrick Kins, DPM    [address]  [place], [place] [address]                Office [phone]  Fax [phone]

## 2019-11-07 NOTE — Telephone Encounter (Signed)
-----   Message from Earlie Server, MD sent at 11/07/2019  8:25 AM EDT ----- Please call her pharmacy to stop Rx of Raloxifen. I switch to tamoxifen and Rx has been sent. Pt is made aware. Thanks.

## 2019-11-07 NOTE — Telephone Encounter (Signed)
[place] and spoke to [place]. Canceled rx for raloxifen.  Verbal order for Tamoxifen 20 mg given, as their fax system is down and our e-script service is down as well. prescription infor read back by [place]  Tamoxifen 20 mg daily  dispense: 30 tabs  RF; 5

## 2019-11-10 ENCOUNTER — Other Ambulatory Visit: Payer: Self-pay | Admitting: Podiatry

## 2019-11-10 DIAGNOSIS — M7751 Other enthesopathy of right foot: Secondary | ICD-10-CM

## 2019-11-13 ENCOUNTER — Telehealth: Payer: Self-pay

## 2019-11-13 NOTE — Telephone Encounter (Signed)
Received surgery paperwork from the [place] office. Left a message for Rebeka to call me back so we can get her surgery scheduled.

## 2019-11-14 ENCOUNTER — Other Ambulatory Visit: Payer: BC Managed Care – PPO

## 2019-11-19 ENCOUNTER — Encounter: Payer: Self-pay | Admitting: Family Medicine

## 2019-11-24 ENCOUNTER — Other Ambulatory Visit: Payer: BC Managed Care – PPO

## 2019-11-26 DIAGNOSIS — S86812A Strain of other muscle(s) and tendon(s) at lower leg level, left leg, initial encounter: Secondary | ICD-10-CM | POA: Diagnosis not present

## 2019-11-26 DIAGNOSIS — I208 Other forms of angina pectoris: Secondary | ICD-10-CM | POA: Diagnosis not present

## 2019-11-26 DIAGNOSIS — I700 Atherosclerosis of aorta: Secondary | ICD-10-CM | POA: Insufficient documentation

## 2019-11-26 DIAGNOSIS — I10 Essential (primary) hypertension: Secondary | ICD-10-CM | POA: Diagnosis not present

## 2019-11-26 DIAGNOSIS — E785 Hyperlipidemia, unspecified: Secondary | ICD-10-CM | POA: Diagnosis not present

## 2019-11-26 DIAGNOSIS — I2089 Other forms of angina pectoris: Secondary | ICD-10-CM | POA: Insufficient documentation

## 2019-12-02 ENCOUNTER — Telehealth (HOSPITAL_COMMUNITY): Payer: Self-pay

## 2019-12-02 ENCOUNTER — Other Ambulatory Visit: Payer: Self-pay | Admitting: Unknown Physician Specialty

## 2019-12-02 ENCOUNTER — Ambulatory Visit (HOSPITAL_COMMUNITY)
Admission: RE | Admit: 2019-12-02 | Discharge: 2019-12-02 | Disposition: A | Discharge status: Home / Self Care | Payer: BC Managed Care – PPO | Source: Ambulatory Visit | Attending: Family Medicine | Admitting: Family Medicine

## 2019-12-02 ENCOUNTER — Telehealth: Payer: Self-pay | Admitting: Unknown Physician Specialty

## 2019-12-02 DIAGNOSIS — U071 COVID-19: Secondary | ICD-10-CM

## 2019-12-02 DIAGNOSIS — I10 Essential (primary) hypertension: Secondary | ICD-10-CM

## 2019-12-02 MED ORDER — EPINEPHRINE 0.3 MG/0.3ML IJ SOAJ: 0.3000 mg | Freq: Once | INTRAMUSCULAR | Status: DC | PRN

## 2019-12-02 MED ORDER — ALBUTEROL SULFATE HFA 108 (90 BASE) MCG/ACT IN AERS: 2.0000 | INHALATION_SPRAY | Freq: Once | RESPIRATORY_TRACT | Status: DC | PRN

## 2019-12-02 MED ORDER — SOTROVIMAB 500 MG/8ML IV SOLN
500.0000 mg | Freq: Once | INTRAVENOUS | Status: AC
  Administered 2019-12-02: 500 mg via INTRAVENOUS

## 2019-12-02 MED ORDER — SODIUM CHLORIDE 0.9 % IV SOLN: INTRAVENOUS | Status: DC | PRN

## 2019-12-02 MED ORDER — METHYLPREDNISOLONE SODIUM SUCC 125 MG IJ SOLR: 125.0000 mg | Freq: Once | INTRAMUSCULAR | Status: DC | PRN

## 2019-12-02 MED ORDER — DIPHENHYDRAMINE HCL 50 MG/ML IJ SOLN: 50.0000 mg | Freq: Once | INTRAMUSCULAR | Status: DC | PRN

## 2019-12-02 MED ORDER — FAMOTIDINE IN NACL 20-0.9 MG/50ML-% IV SOLN: 20.0000 mg | Freq: Once | INTRAVENOUS | Status: DC | PRN

## 2019-12-02 NOTE — Telephone Encounter (Signed)
Pt's husband checking in at the [facility], trying to also get his wife an appointment. Pt called his wife and pre-screened by RN for monoclonal antibody therapy.   Discuss with patient about Covid symptoms and the use of the monoclonal antibody infusion for those with mild to moderate Covid symptoms and at a high risk of hospitalization.     Pt appears to qualify for this infusion due to co-morbid conditions and/or a member of an at-risk group in accordance with the FDA Emergency Use Authorization.    Pt stated her symptoms started on 11/6, tested positive for COVID on 11/8 at [facility], [place], and has cough, congestion, and fatigue. Pt states she has a history of cancer and HTN. RN informed patient that an APP will call and verify information and if she qualifies will set up an appointment today with her husband.

## 2019-12-02 NOTE — Discharge Instructions (Signed)

## 2019-12-02 NOTE — Telephone Encounter (Signed)
I connected by phone with Christie Ramirez on 12/02/2019 at 1:11 PM to discuss the potential use of a new treatment for mild to moderate COVID-19 viral infection in non-hospitalized patients.  This patient is a 64 y.o. female that meets the FDA criteria for Emergency Use Authorization of COVID monoclonal antibody casirivimab/imdevimab, bamlanivimab/eteseviamb, or sotrovimab.  Has a (+) direct SARS-CoV-2 viral test result  Has mild or moderate COVID-19   Is NOT hospitalized due to COVID-19  Is within 10 days of symptom onset  Has at least one of the high risk factor(s) for progression to severe COVID-19 and/or hospitalization as defined in EUA.  Specific high risk criteria : Immunosuppressive Disease or Treatment and Cardiovascular disease or hypertension   I have spoken and communicated the following to the patient or parent/caregiver regarding COVID monoclonal antibody treatment:  1. FDA has authorized the emergency use for the treatment of mild to moderate COVID-19 in adults and pediatric patients with positive results of direct SARS-CoV-2 viral testing who are 9 years of age and older weighing at least 40 kg, and who are at high risk for progressing to severe COVID-19 and/or hospitalization.  2. The significant known and potential risks and benefits of COVID monoclonal antibody, and the extent to which such potential risks and benefits are unknown.  3. Information on available alternative treatments and the risks and benefits of those alternatives, including clinical trials.  4. Patients treated with COVID monoclonal antibody should continue to self-isolate and use infection control measures (e.g., wear mask, isolate, social distance, avoid sharing personal items, clean and disinfect "high touch" surfaces, and frequent handwashing) according to CDC guidelines.   5. The patient or parent/caregiver has the option to accept or refuse COVID monoclonal antibody treatment.  After reviewing this  information with the patient, the patient has agreed to receive one of the available covid 19 monoclonal antibodies and will be provided an appropriate fact sheet prior to infusion. Kathrine Haddock, NP 12/02/2019 1:11 PM  Sx onset: 11/29/2019

## 2019-12-02 NOTE — Progress Notes (Signed)
Diagnosis: COVID-19  Physician: Dr. Joya Gaskins  Procedure: Covid Infusion Clinic Med: Sotrovimab infusion- Provided patient with Sotrovimab fact sheet for patients, parents, and caregivers prior to infusion.   Complications: No immediate complications noted. Patient's BP elevated prior to and post infusion. Patient stated she has hx of HTN and has not taken her BP medication today. Patient's temp 100.7, patient declined Tylenol at time of visit and stated she would take Tylenol when she gets home.   Discharge: Discharged home   Christie Ramirez 12/02/2019

## 2019-12-03 ENCOUNTER — Telehealth: Payer: BC Managed Care – PPO | Admitting: Internal Medicine

## 2019-12-03 ENCOUNTER — Other Ambulatory Visit: Payer: Self-pay

## 2019-12-03 ENCOUNTER — Telehealth: Payer: Self-pay | Admitting: Oncology

## 2019-12-03 NOTE — Telephone Encounter (Signed)
Lets apply the general rule of postpone 2 weeks for covid pt. Thanks.

## 2019-12-03 NOTE — Telephone Encounter (Signed)
Pt left VM that she has tested postive for Covid and needs her mammo and [place] scheduled for 11/11 rescheduled. Pt would like a call with new appt dates. Pt also has a lab only scheduled with [place] on 11/15 that will need modifying also.

## 2019-12-03 NOTE — Telephone Encounter (Signed)
Dr. Tasia Catchings, how far out should patient be rescheduled for lab (LFT) and mammogram?

## 2019-12-03 NOTE — Telephone Encounter (Signed)
Pts Labs are now sched for 01/05/20 @ 11:00 date and time per pts request And pt called [facility] herself and had them to R/S her mammo and [place] to 01/05/20 @ 2p

## 2019-12-04 ENCOUNTER — Other Ambulatory Visit: Payer: BC Managed Care – PPO

## 2019-12-04 ENCOUNTER — Emergency Department
Admission: EM | Admit: 2019-12-04 | Discharge: 2019-12-04 | Disposition: A | Discharge status: Home / Self Care | Payer: BC Managed Care – PPO | Attending: Emergency Medicine | Admitting: Emergency Medicine

## 2019-12-04 ENCOUNTER — Emergency Department: Payer: BC Managed Care – PPO

## 2019-12-04 ENCOUNTER — Other Ambulatory Visit: Payer: Self-pay

## 2019-12-04 DIAGNOSIS — Z7982 Long term (current) use of aspirin: Secondary | ICD-10-CM | POA: Diagnosis not present

## 2019-12-04 DIAGNOSIS — E860 Dehydration: Secondary | ICD-10-CM | POA: Diagnosis not present

## 2019-12-04 DIAGNOSIS — Z79899 Other long term (current) drug therapy: Secondary | ICD-10-CM | POA: Insufficient documentation

## 2019-12-04 DIAGNOSIS — E039 Hypothyroidism, unspecified: Secondary | ICD-10-CM | POA: Insufficient documentation

## 2019-12-04 DIAGNOSIS — I10 Essential (primary) hypertension: Secondary | ICD-10-CM | POA: Diagnosis not present

## 2019-12-04 DIAGNOSIS — R0602 Shortness of breath: Secondary | ICD-10-CM | POA: Diagnosis not present

## 2019-12-04 DIAGNOSIS — Z853 Personal history of malignant neoplasm of breast: Secondary | ICD-10-CM | POA: Insufficient documentation

## 2019-12-04 DIAGNOSIS — R21 Rash and other nonspecific skin eruption: Secondary | ICD-10-CM | POA: Diagnosis not present

## 2019-12-04 DIAGNOSIS — Z85850 Personal history of malignant neoplasm of thyroid: Secondary | ICD-10-CM | POA: Diagnosis not present

## 2019-12-04 DIAGNOSIS — U071 COVID-19: Secondary | ICD-10-CM

## 2019-12-04 DIAGNOSIS — R509 Fever, unspecified: Secondary | ICD-10-CM | POA: Diagnosis not present

## 2019-12-04 LAB — URINALYSIS, COMPLETE (UACMP) WITH MICROSCOPIC
Bacteria, UA: NONE SEEN
Bilirubin Urine: NEGATIVE
Glucose, UA: NEGATIVE mg/dL
Hgb urine dipstick: NEGATIVE
Ketones, ur: NEGATIVE mg/dL
Leukocytes,Ua: NEGATIVE
Nitrite: NEGATIVE
Protein, ur: NEGATIVE mg/dL
Specific Gravity, Urine: 1.001 — ABNORMAL LOW (ref 1.005–1.030)
Squamous Epithelial / HPF: NONE SEEN (ref 0–5)
pH: 7 (ref 5.0–8.0)

## 2019-12-04 LAB — COMPREHENSIVE METABOLIC PANEL
ALT: 46 U/L — ABNORMAL HIGH (ref 0–44)
AST: 26 U/L (ref 15–41)
Albumin: 4.1 g/dL (ref 3.5–5.0)
Alkaline Phosphatase: 106 U/L (ref 38–126)
Anion gap: 10 (ref 5–15)
BUN: 11 mg/dL (ref 8–23)
CO2: 27 mmol/L (ref 22–32)
Calcium: 8.8 mg/dL — ABNORMAL LOW (ref 8.9–10.3)
Chloride: 95 mmol/L — ABNORMAL LOW (ref 98–111)
Creatinine, Ser: 0.78 mg/dL (ref 0.44–1.00)
GFR, Estimated: >60 mL/min (ref >60)
Glucose, Bld: 109 mg/dL — ABNORMAL HIGH (ref 70–99)
Potassium: 3.6 mmol/L (ref 3.5–5.1)
Sodium: 132 mmol/L — ABNORMAL LOW (ref 135–145)
Total Bilirubin: 0.6 mg/dL (ref 0.3–1.2)
Total Protein: 7.1 g/dL (ref 6.5–8.1)

## 2019-12-04 LAB — CBC
HCT: 38.3 % (ref 36.0–46.0)
Hemoglobin: 12.9 g/dL (ref 12.0–15.0)
MCH: 28.7 pg (ref 26.0–34.0)
MCHC: 33.7 g/dL (ref 30.0–36.0)
MCV: 85.3 fL (ref 80.0–100.0)
Platelets: 302 10*3/uL (ref 150–400)
RBC: 4.49 MIL/uL (ref 3.87–5.11)
RDW: 13.3 % (ref 11.5–15.5)
WBC: 6.4 10*3/uL (ref 4.0–10.5)
nRBC: 0 % (ref 0.0–0.2)

## 2019-12-04 LAB — LIPASE, BLOOD: Lipase: 21 U/L (ref 11–51)

## 2019-12-04 MED ORDER — ONDANSETRON HCL 4 MG/2ML IJ SOLN
4.0000 mg | Freq: Once | INTRAMUSCULAR | Status: AC
  Administered 2019-12-04: 4 mg via INTRAVENOUS
  Filled 2019-12-04: qty 2

## 2019-12-04 MED ORDER — LACTATED RINGERS IV BOLUS
1000.0000 mL | Freq: Once | INTRAVENOUS | Status: AC
  Administered 2019-12-04: 1000 mL via INTRAVENOUS

## 2019-12-04 MED ORDER — IBUPROFEN 400 MG PO TABS
400.0000 mg | ORAL_TABLET | Freq: Once | ORAL | Status: AC
  Administered 2019-12-04: 400 mg via ORAL
  Filled 2019-12-04: qty 1

## 2019-12-04 MED ORDER — ONDANSETRON HCL 4 MG PO TABS: 4.0000 mg | ORAL_TABLET | Freq: Three times a day (TID) | ORAL | 0 refills | Status: DC | PRN

## 2019-12-04 NOTE — ED Provider Notes (Signed)
[facility] Emergency Department Provider Note  ____________________________________________   First MD Initiated Contact with Patient 12/04/19 1713     (approximate)  I have reviewed the triage vital signs and the nursing notes.   HISTORY  Chief Complaint Shortness of Breath and Diarrhea   HPI Christie Ramirez is a 64 y.o. female with a past medical history of breast cancer and thyroid cancer status post radiation chemotherapy not currently undergoing any active treatment, GERD, HTN, HDL, and hypothyroidism who presents for assessment of approximately 5 days of fevers, chills, weakness, cough, shortness of breath, nausea, and diarrhea.  Patient notes he was diagnosed with COVID-19 4 days ago and got an antibody infusion at an outpatient clinic 3 days ago.  She states the infusion did not help and she is emergency room but she feels is not getting better.  She denies any rash, chest pain aside from when she is coughing, hemoptysis, focal lower extremity edema or pain, tobacco abuse and EtOH use, also drug use, or any history of CAD or DVT/PE.  No clear alleviating aggravating factors.  No prior similar episodes.  Patient notes she was not vaccinated against Covid.         Past Medical History:  Diagnosis Date  . Anxiety   . 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   . Cancer ([place]) 1995   thyroid  . Endometriosis 2009  . Fibula fracture 11/11/2015  . GERD (gastroesophageal reflux disease)   . Hyperlipidemia   . Hypertension   . Hypothyroidism   . Osteopenia    rt hip  . Personal history of radiation therapy 2015   mammosite  . Seasonal allergies   . Sleep apnea    does not wear CPAP    Patient Active Problem List   Diagnosis Date Noted  . Elevated alanine aminotransferase (ALT) level 11/07/2019  . H/O ongoing treatment with raloxifene  11/07/2019  . Vitamin D deficiency 12/12/2018  . Left foot pain 11/06/2018  . Breast lesion 10/19/2018  . History of thyroid cancer 10/19/2018  . Abdominal pain 10/19/2018  . History of breast cancer 10/19/2018  . Hoarseness 10/19/2018  . Left shoulder pain 08/25/2018  . Pain of left calf 08/25/2018  . Lipoma of thigh 03/21/2018  . Rupture of hamstring tendon 03/08/2018  . Prediabetes 11/30/2017  . Carotid artery plaque 03/05/2017  . Palpitations 02/03/2017  . Rectal bleeding 02/03/2017  . Night sweats 02/03/2017  . Carpal tunnel syndrome 02/03/2017  . GERD (gastroesophageal reflux disease) 02/03/2017  . Neck pain 10/30/2016  . Acute pain of left knee 07/27/2016  . Acute pain of right shoulder 07/06/2016  . 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 Fatigue 10/01/2015  . Right hip pain 08/11/2015  . Osteopenia 08/11/2015  . Chronic bronchitis ([place]) 01/29/2015  . History of herpes genitalis 09/23/2014  . Endometriosis 09/23/2014  . OSA (obstructive sleep apnea) 09/19/2013  . Colon cancer screening 07/28/2013  . Insomnia 06/09/2013  . Breast cancer of upper-inner quadrant of right female breast ([place]) 04/08/2013  . Hyperlipidemia with target low density lipoprotein (LDL) cholesterol less than 100 mg/dL 06/26/2011  . Hypertension 05/18/2011  . Anxiety 05/18/2011  . Hypothyroidism 10/12/2010    Past Surgical History:  Procedure Laterality Date  . ABDOMINAL HYSTERECTOMY  2005   Martin DeFrancesco,MD  . BREAST BIOPSY Right 04-02-13  INVASIVE MAMMARY CARCINOMA WITH FEATURES OF TUBULAR CARCINOMA,  . BREAST CYST EXCISION Left 12/02/2018   Procedure: EXCISION LEFT BREAST SEBACEOUS CYST;  Surgeon: Jovita Kussmaul, MD;  Location: [facility];  Service: General;  Laterality: Left;  . BREAST LUMPECTOMY Right 04/2013   INVASIVE MAMMARY CARCINOMA WITH FEATURES OF TUBULAR CARCINOMA,  . BREAST SURGERY Right 05/01/13   wide excision  . colectomy   2009   secondary to endometriosis DR. Mynor Witkop  . 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 No malignancy.  . COLONOSCOPY  2009   Dr. Tamala Julian  . COLONOSCOPY WITH PROPOFOL N/A 11/19/2018   Procedure: COLONOSCOPY WITH PROPOFOL;  Surgeon: Lucilla Lame, MD;  Location: [facility] ENDOSCOPY;  Service: Endoscopy;  Laterality: N/A;  . FOOT SURGERY  right  . MASTOPEXY Bilateral 01/03/2019   Procedure: MASTOPEXY;  Surgeon: Irene Limbo, MD;  Location: [facility];  Service: Plastics;  Laterality: Bilateral;    Prior to Admission medications   Medication Sig Start Date End Date Taking? Authorizing Provider  albuterol (PROVENTIL HFA;VENTOLIN HFA) 108 (90 Base) MCG/ACT inhaler Inhale 2 puffs into the lungs every 6 (six) hours as needed for wheezing or shortness of breath. 10/13/16   Pleas Koch, NP  amLODipine (NORVASC) 5 MG tablet Take 2 tablets (10 mg total) by mouth daily. 01/16/19 11/06/19  Leone Haven, MD  aspirin 81 MG tablet Take 81 mg by mouth daily.    [provider]  atorvastatin (LIPITOR) 40 MG tablet TAKE ONE TABLET BY MOUTH DAILY 11/04/19   Leone Haven, MD  CALCIUM-MAGNESIUM-ZINC PO Take by mouth 3 (three) times daily.    [provider]  cetirizine (ZYRTEC) 10 MG tablet SMARTSIG:1 Pill By Mouth Daily PRN 11/07/18   [provider]  Cholecalciferol (VITAMIN D-3 PO) Take 1,000 Units by mouth.    [provider]  Cholecalciferol 1.25 MG (50000 UT) capsule Take 1 capsule (50,000 Units total) by mouth once a week. Patient not taking: Reported on 11/06/2019 01/03/19   McLean-Scocuzza, Nino Glow, MD  escitalopram (LEXAPRO) 10 MG tablet TAKE ONE TABLET BY MOUTH DAILY Patient not taking: Reported on 11/06/2019 10/15/19   Leone Haven, MD  ezetimibe (ZETIA) 10 MG tablet Take 1 tablet (10 mg total) by mouth daily. 04/16/19   Leone Haven, MD  fluticasone (FLONASE) 50 MCG/ACT  nasal spray Place 2 sprays into both nostrils daily. Patient not taking: Reported on 11/06/2019 11/07/18   [provider]  GARLIC PO Take 539 mg by mouth.    [provider]  levothyroxine (SYNTHROID) 175 MCG tablet Take 1 tablet (175 mcg total) by mouth daily before breakfast. 05/06/19   Leone Haven, MD  losartan (COZAAR) 100 MG tablet Take 1 tablet (100 mg total) by mouth daily. 03/05/19   Leone Haven, MD  meloxicam (MOBIC) 7.5 MG tablet Take 1 tablet (7.5 mg total) by mouth daily. Patient taking differently: Take 7.5 mg by mouth daily as needed.  04/29/19   Edrick Kins, DPM  Multiple Vitamins-Minerals (MULTIVITAMIN WITH MINERALS) tablet Take 1 tablet by mouth daily.    [provider]  omeprazole (PRILOSEC) 20 MG capsule TAKE 1 CAPSULE BY MOUTH EVERY DAY Patient not taking: TAKE 1 CAPSULE BY MOUTH EVERY DAY 11/04/18   Leone Haven, MD  ondansetron (ZOFRAN) 4 MG tablet Take 1 tablet (4 mg total) by mouth every 8 (eight) hours as needed for up to  10 doses for nausea or vomiting. 12/04/19   Lucrezia Starch, MD  tamoxifen (NOLVADEX) 20 MG tablet Take 1 tablet (20 mg total) by mouth daily. 11/07/19   Earlie Server, MD  traZODone (DESYREL) 50 MG tablet TAKE 2 TABLETS (100 MG TOTAL) BY MOUTH AT BEDTIME AS NEEDED FOR SLEEP. 07/14/19   Leone Haven, MD  vitamin C (ASCORBIC ACID) 500 MG tablet Take 500 mg by mouth daily.    [provider]  clonazePAM (KLONOPIN) 1 MG tablet Take 1 tablet (1 mg total) by mouth daily as needed for anxiety. Patient not taking: Reported on 11/06/2019 12/12/18 12/04/19  McLean-Scocuzza, Nino Glow, MD  gabapentin (NEURONTIN) 300 MG capsule Take 1 capsule (300 mg total) by mouth at bedtime. Patient not taking: Reported on 11/06/2019 02/04/19 12/04/19  Edrick Kins, DPM    Allergies Codeine, Codeine, Iodinated diagnostic agents, Iodine, Iodine, Penicillins, and Penicillins  Family History  Problem Relation Age of Onset   . Colon polyps Father        age 93  . Heart disease Father   . Bone cancer Father   . Heart disease Mother   . Rheumatic fever Mother   . Heart disease Paternal Grandmother   . Heart disease Paternal Grandfather   . Cancer Neg Hx   . Diabetes Neg Hx   . Breast cancer Neg Hx     Social History Social History   Tobacco Use  . Smoking status: Never Smoker  . Smokeless tobacco: Never Used  Vaping Use  . Vaping Use: Never used  Substance Use Topics  . Alcohol use: Not Currently    Alcohol/week: 0.0 standard drinks    Comment: wine socially  . Drug use: No    Review of Systems  Review of Systems  Constitutional: Positive for chills, fever and malaise/fatigue.  HENT: Negative for sore throat.   Eyes: Negative for pain.  Respiratory: Positive for cough and shortness of breath. Negative for stridor.   Cardiovascular: Positive for chest pain ( only when coughing).  Gastrointestinal: Positive for diarrhea, nausea and vomiting.  Genitourinary: Negative for dysuria.  Musculoskeletal: Positive for myalgias.  Skin: Negative for rash.  Neurological: Negative for seizures, loss of consciousness and headaches.  Psychiatric/Behavioral: Negative for suicidal ideas.  All other systems reviewed and are negative.     ____________________________________________   PHYSICAL EXAM:  VITAL SIGNS: ED Triage Vitals  Enc Vitals Group     BP 12/04/19 1425 (!) 140/93     Pulse Rate 12/04/19 1425 83     Resp 12/04/19 1425 20     Temp --      Temp src --      SpO2 12/04/19 1425 97 %     Weight 12/04/19 1426 155 lb (70.3 kg)     Height 12/04/19 1426 5\' 1"  (1.549 m)     Head Circumference --      Peak Flow --      Pain Score 12/04/19 1425 0     Pain Loc --      Pain Edu? --      Excl. in [place]? --    Vitals:   12/04/19 1425  BP: (!) 140/93  Pulse: 83  Resp: 20  SpO2: 97%   Physical Exam Vitals and nursing note reviewed.  Constitutional:      General: She is not in acute  distress.    Appearance: She is well-developed.  HENT:     Head: Normocephalic and atraumatic.  Right Ear: External ear normal.     Left Ear: External ear normal.     Mouth/Throat:     Mouth: Mucous membranes are dry.  Eyes:     Conjunctiva/sclera: Conjunctivae normal.  Cardiovascular:     Rate and Rhythm: Normal rate and regular rhythm.     Heart sounds: No murmur heard.   Pulmonary:     Effort: Pulmonary effort is normal. No respiratory distress.     Breath sounds: Normal breath sounds.  Abdominal:     Palpations: Abdomen is soft.     Tenderness: There is no abdominal tenderness.  Musculoskeletal:     Cervical back: Neck supple.     Right lower leg: No edema.     Left lower leg: No edema.  Skin:    General: Skin is warm and dry.     Capillary Refill: Capillary refill takes 2 to 3 seconds.  Neurological:     Mental Status: She is alert and oriented to person, place, and time.  Psychiatric:        Mood and Affect: Mood normal.      ____________________________________________   LABS (all labs ordered are listed, but only abnormal results are displayed)  Labs Reviewed  COMPREHENSIVE METABOLIC PANEL - Abnormal; Notable for the following components:      Result Value   Sodium 132 (*)    Chloride 95 (*)    Glucose, Bld 109 (*)    Calcium 8.8 (*)    ALT 46 (*)    All other components within normal limits  URINALYSIS, COMPLETE (UACMP) WITH MICROSCOPIC - Abnormal; Notable for the following components:   Color, Urine COLORLESS (*)    APPearance CLEAR (*)    Specific Gravity, Urine 1.001 (*)    All other components within normal limits  CBC  LIPASE, BLOOD   ____________________________________________  EKG  Sinus rhythm with a ventricular rate of 81, normal axis, unremarkable intervals, no posterior fascicle block and no clear evidence of acute ischemia or significant arrhythmia. ____________________________________________  RADIOLOGY  ED MD interpretation:  No clear focal consolidation, large effusion, overt edema, wide mediastinum, pneumothorax, or other acute thoracic process.  Official radiology report(s): DG Chest 2 View  Result Date: 12/04/2019 CLINICAL DATA:  64 year old female with shortness of breath. EXAM: CHEST - 2 VIEW COMPARISON:  Chest radiograph dated 11/22/2018. FINDINGS: The lungs are clear. There is no pleural effusion pneumothorax. The cardiac silhouette is within limits. Atherosclerotic calcification of the aortic arch. The aorta is slightly tortuous. No acute osseous pathology. Degenerative changes of the spine and scoliosis. IMPRESSION: No active cardiopulmonary disease. Electronically Signed   By: Anner Crete M.D.   On: 12/04/2019 15:05    ____________________________________________   PROCEDURES  Procedure(s) performed (including Critical Care):  .1-3 Lead EKG Interpretation Performed by: Lucrezia Starch, MD Authorized by: Lucrezia Starch, MD     Interpretation: normal     ECG rate assessment: normal     Rhythm: sinus rhythm     Ectopy: none     Conduction: normal       ____________________________________________   INITIAL IMPRESSION / ASSESSMENT AND PLAN / ED COURSE        Patient presents with [place] to history exam for assessment of above-noted symptoms after recent diagnosis of COVID-19.  Patient is afebrile and hemodynamically stable arrival.  Overall constellation patient's symptoms and presentation is most consistent with COVID-19 pneumonia and some gastroenteritis with no evidence of dehydration given dry mucous membranes and  decreased cap refill.  Very low suspicion for ACS given patient has no chest pain except when she is having a coughing episode and has a rather recurrent EKG.  Chest x-ray shows no evidence of consolidative infection suggestive of bacterial ammonia patient is nonfebrile and of low suspicion for superinfection with bacteria at this time.  ECG shows no significant arrhythmia.   CMP shows evidence of some mild dehydration with a sodium of 122 but otherwise no significant ocular or metabolic derangements.  CBC is unremarkable and patient is no evidence of acute anemia.  Lipase is unremarkable and not consistent with pancreatitis.  UA does not appear infected and not consistent with cystitis or pyelonephritis.  Very low suspicion for PE at this time given absence of tachycardia, tachypnea, hypoxia and no history of hemoptysis, tobacco abuse, estrogen use, lower extremity edema swelling or other clear concerning factors on exam.  Patient given some IV fluids as well as analgesia and antiemetic as noted below.  Rx written for Zofran.  Patient discharged stable condition.  Strict impression sinusitis gastric   ____________________________________________   FINAL CLINICAL IMPRESSION(S) / ED DIAGNOSES  Final diagnoses:  COVID  Dehydration    Medications  lactated ringers bolus 1,000 mL (1,000 mLs Intravenous New Bag/Given 12/04/19 1740)  ondansetron (ZOFRAN) injection 4 mg (4 mg Intravenous Given 12/04/19 1740)  ibuprofen (ADVIL) tablet 400 mg (400 mg Oral Given 12/04/19 1740)     ED Discharge Orders         Ordered    ondansetron (ZOFRAN) 4 MG tablet  Every 8 hours PRN        12/04/19 1732           Note:  This document was prepared using Dragon voice recognition software and may include unintentional dictation errors.   Lucrezia Starch, MD 12/04/19 [phone]

## 2019-12-04 NOTE — ED Triage Notes (Signed)
Pt ambulatory to triage c/o increased in SOB and wheezing. Pt reports testing covid positive Monday 11/8 at [facility]. Pt reports having infusion on tues afternoon and has experienced increase in sob and diarrhea starting yesterday. NAD noted at this time.

## 2019-12-05 ENCOUNTER — Telehealth: Payer: Self-pay | Admitting: Nurse Practitioner

## 2019-12-05 ENCOUNTER — Encounter: Payer: Self-pay | Admitting: Nurse Practitioner

## 2019-12-05 ENCOUNTER — Telehealth (INDEPENDENT_AMBULATORY_CARE_PROVIDER_SITE_OTHER): Payer: BC Managed Care – PPO | Admitting: Nurse Practitioner

## 2019-12-05 VITALS — Ht 61.0 in | Wt 155.0 lb

## 2019-12-05 DIAGNOSIS — U071 COVID-19: Secondary | ICD-10-CM | POA: Diagnosis not present

## 2019-12-05 DIAGNOSIS — Z8616 Personal history of COVID-19: Secondary | ICD-10-CM | POA: Insufficient documentation

## 2019-12-05 MED ORDER — DEXTROMETHORPHAN-GUAIFENESIN 5-100 MG/5ML PO LIQD: 10.0000 mL | Freq: Every evening | ORAL | 0 refills | Status: DC | PRN

## 2019-12-05 MED ORDER — BENZONATATE 200 MG PO CAPS: 200.0000 mg | ORAL_CAPSULE | Freq: Two times a day (BID) | ORAL | 0 refills | Status: DC | PRN

## 2019-12-05 MED ORDER — PREDNISONE 10 MG PO TABS: ORAL_TABLET | ORAL | 0 refills | Status: DC

## 2019-12-05 MED ORDER — ALBUTEROL SULFATE HFA 108 (90 BASE) MCG/ACT IN AERS: 2.0000 | INHALATION_SPRAY | Freq: Four times a day (QID) | RESPIRATORY_TRACT | 1 refills | Status: DC | PRN

## 2019-12-05 NOTE — Progress Notes (Signed)
Virtual Visit via Virtual Note  This visit type was conducted due to national recommendations for restrictions regarding the COVID-19 pandemic (e.g. social distancing).  This format is felt to be most appropriate for this patient at this time.  All issues noted in this document were discussed and addressed.  No physical exam was performed (except for noted visual exam findings with Video Visits).   I connected with@ on 12/05/19 at 11:30 AM EST by a video enabled telemedicine application or telephone and verified that I am speaking with the correct person using two identifiers. Location patient: home Location provider: work or home office Persons participating in the virtual visit: patient, provider  I discussed the limitations, risks, security and privacy concerns of performing an evaluation and management service by telephone and the availability of in person appointments. I also discussed with the patient that there may be a patient responsible charge related to this service. The patient expressed understanding and agreed to proceed.   Reason for visit: Follow-up for Covid infection.  Patient notes she was not vaccinated against Covid.   HPI: This 64 year old patient with history of breast cancer, thyroid cancer, GERD, hypertension, hyperlipidemia, hypothyroidism, was diagnosed with a Covid on Monday.  She had monoclonal antibody infusion on Tuesday.  She reported  fevers, chills, weakness, cough, shortness of breath, nausea diarrhea.  She presented to the emergency department yesterday for the symptoms.  Vital signs revealed a blood pressure 140/93, pulse 83, respirations 20, SPO2 97%. Laboratory studies revealed a sodium of 132, CBC: WBC 6.4, Hgb 12.9. EKG showed a sinus rhythm, no evidence of acute ischemia or significant arrhythmia.  Two-view chest x-ray showed no acute pulmonary cardiopulmonary disease.  Positive atherosclerotic calcification of the aortic arch.  The aorta is slightly  tortuous.  Degenerative changes of the spine and scoliosis.  Very low suspicion for PE.  She was given IV fluids, analgesia, antiemetic.  Prescription written for Zofran.  She reports today that she did not fill the Zofran.  She is tolerating her diet  well today.  She has been drinking Gatorade now instead of just plain water.  Still has no appetite but was able to eat peanut butter and bread today.  No further nausea /vomiting.  She has had no diarrhea today and it is clearing up.  She still has cough, and chest tightness feeling when she coughs.She is coughing a lot at night.  She has slight wheezing.  She has no shortness of breath or DOE.  SPO2 at home is 95 to 96%.  Improved sore throat and headache. Fatigue is still present.  She has blocked sinuses.  She does not have purulent discharge, headache, or teeth pain.  She has registered no fever but she has been taking Tylenol regularly.  She does sweat at night.  She does not have a prescription for albuterol currently but she has used it in the past for chronic bronchitis.  Patient feels like she could benefit from albuterol.    ROS: See pertinent positives and negatives per HPI.  Past Medical History:  Diagnosis Date  . Anxiety   . 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   . Cancer ([place]) 1995   thyroid  . Endometriosis 2009  . Fibula fracture 11/11/2015  . GERD (gastroesophageal reflux disease)   . Hyperlipidemia   . Hypertension   . Hypothyroidism   . Osteopenia  rt hip  . Personal history of radiation therapy 2015   mammosite  . Seasonal allergies   . Sleep apnea    does not wear CPAP    Past Surgical History:  Procedure Laterality Date  . ABDOMINAL HYSTERECTOMY  2005   Martin DeFrancesco,MD  . BREAST BIOPSY Right 04-02-13   INVASIVE MAMMARY CARCINOMA WITH FEATURES OF TUBULAR CARCINOMA,  . BREAST CYST EXCISION Left  12/02/2018   Procedure: EXCISION LEFT BREAST SEBACEOUS CYST;  Surgeon: Jovita Kussmaul, MD;  Location: [place];  Service: General;  Laterality: Left;  . BREAST LUMPECTOMY Right 04/2013   INVASIVE MAMMARY CARCINOMA WITH FEATURES OF TUBULAR CARCINOMA,  . BREAST SURGERY Right 05/01/13   wide excision  . colectomy  2009   secondary to endometriosis DR. Smith  . 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 No malignancy.  . COLONOSCOPY  2009   Dr. Tamala Julian  . COLONOSCOPY WITH PROPOFOL N/A 11/19/2018   Procedure: COLONOSCOPY WITH PROPOFOL;  Surgeon: Lucilla Lame, MD;  Location[facility];  Service: Endoscopy;  Laterality: N/A;  . FOOT SURGERY  right  . MASTOPEXY Bilateral 01/03/2019   Procedure: MASTOPEXY;  Surgeon: Irene Limbo, MD;  Location: [place];  Service: Plastics;  Laterality: Bilateral;    Family History  Problem Relation Age of Onset  . Colon polyps Father        age 32  . Heart disease Father   . Bone cancer Father   . Heart disease Mother   . Rheumatic fever Mother   . Heart disease Paternal Grandmother   . Heart disease Paternal Grandfather   . Cancer Neg Hx   . Diabetes Neg Hx   . Breast cancer Neg Hx     SOCIAL HX: Never smoked   Current Outpatient Medications:  .  albuterol (VENTOLIN HFA) 108 (90 Base) MCG/ACT inhaler, Inhale 2 puffs into the lungs every 6 (six) hours as needed for wheezing or shortness of breath., Disp: 1 each, Rfl: 1 .  amLODipine (NORVASC) 5 MG tablet, Take 2 tablets (10 mg total) by mouth daily., Disp: 180 tablet, Rfl: 2 .  aspirin 81 MG tablet, Take 81 mg by mouth daily., Disp: , Rfl:  .  atorvastatin (LIPITOR) 40 MG tablet, TAKE ONE TABLET BY MOUTH DAILY, Disp: 90 tablet, Rfl: 0 .  CALCIUM-MAGNESIUM-ZINC PO, Take by mouth 3 (three) times daily., Disp: , Rfl:  .  cetirizine (ZYRTEC) 10 MG tablet, SMARTSIG:1 Pill By Mouth Daily PRN, Disp: , Rfl:  .   Cholecalciferol (VITAMIN D-3 PO), Take 1,000 Units by mouth., Disp: , Rfl:  .  escitalopram (LEXAPRO) 10 MG tablet, TAKE ONE TABLET BY MOUTH DAILY, Disp: 90 tablet, Rfl: 0 .  ezetimibe (ZETIA) 10 MG tablet, Take 1 tablet (10 mg total) by mouth daily., Disp: 90 tablet, Rfl: 1 .  fluticasone (FLONASE) 50 MCG/ACT nasal spray, Place 2 sprays into both nostrils daily. , Disp: , Rfl:  .  GARLIC PO, Take 426 mg by mouth., Disp: , Rfl:  .  levothyroxine (SYNTHROID) 175 MCG tablet, Take 1 tablet (175 mcg total) by mouth daily before breakfast., Disp: 90 tablet, Rfl: 1 .  losartan (COZAAR) 100 MG tablet, Take 1 tablet (100 mg total) by mouth daily., Disp: 90 tablet, Rfl: 1 .  Multiple Vitamins-Minerals (MULTIVITAMIN WITH MINERALS) tablet, Take 1 tablet by mouth daily., Disp: , Rfl:  .  ondansetron (ZOFRAN) 4 MG tablet, Take 1 tablet (  4 mg total) by mouth every 8 (eight) hours as needed for up to 10 doses for nausea or vomiting., Disp: 10 tablet, Rfl: 0 .  traZODone (DESYREL) 50 MG tablet, TAKE 2 TABLETS (100 MG TOTAL) BY MOUTH AT BEDTIME AS NEEDED FOR SLEEP., Disp: 180 tablet, Rfl: 0 .  vitamin C (ASCORBIC ACID) 500 MG tablet, Take 500 mg by mouth daily., Disp: , Rfl:  .  benzonatate (TESSALON) 200 MG capsule, Take 1 capsule (200 mg total) by mouth 2 (two) times daily as needed for up to 7 days for cough., Disp: 14 capsule, Rfl: 0 .  Dextromethorphan-guaiFENesin 5-100 MG/5ML LIQD, Take 10 mLs by mouth at bedtime as needed for up to 7 days (for cough)., Disp: 70 mL, Rfl: 0 .  predniSONE (DELTASONE) 10 MG tablet, Take 6 tablets ( 60 mg total) on day 1 and take 5 tablets (50 mg total) on day 2, and decrease by 1 tablet (10 mg) every day until off in 6 days., Disp: 21 tablet, Rfl: 0  EXAM:  VITALS per patient if applicable: Temp 73.4 heart rate 81 SaO2 95 to 96% weight 155  GENERAL: alert, oriented, appears mildly ill  and in no acute distress  HEENT: atraumatic, conjunctiva clear, no obvious abnormalities on  inspection of external nose and ears  NECK: normal movements of the head and neck  LUNGS: on inspection no signs of respiratory distress, breathing rate appears normal, no obvious gross SOB, gasping or wheezing  CV: no obvious cyanosis  MS: moves all visible extremities without noticeable abnormality  PSYCH/NEURO: pleasant and cooperative, no obvious depression or anxiety, speech and thought processing grossly intact  ASSESSMENT AND PLAN:  Discussed the following assessment and plan:  COVID-19 virus infection  Emergency room records, lab and x-ray reviewed.  Patient advised: Rest, hydrate very well, chicken broth for a few servings to increase sodium and then no longer needed. Eat healthy protein food, Tylenol or Advil as directed. You may utilize over the counter medications to treat other  Symptoms.  Call back if symptoms worsen or seek in -person care at Acute Care.  Seek  treatment in the emergency room if pulse oximeter is <90% and stays there or if respiratory issues/distress develops or unrelieved chest pain.   Please begin:   Vit C 1000 mg daily- IF it does not upset your stomach- stop it if it does  Vitamin D3 4000 IU daily  Zinc 100 g daily   I am sending 4 prescriptions to your pharmacy for chest tightness and slight wheezing:   1. Albuterol inhaler:  2 puffs every 6 hours as needed for wheezing.  (this will make your heart race a little)  2. Prednisone in a tapering  dose  Take 6 tablets all at once on day 1, then 5 tablets all at once on day 2,  and continue to reduce dose by 1 tablet daily until gone.   3. Tessalon Perles for daytime cough   4. Mucinex Dm for night time cough    You should continue your self imposed quarantine until you can answer "yes" to ALL 3 of the conditions below:   1) Your symptoms (fever, cough, shortness of breath) started 10 or more days ago   2) Your body temperature  has been normal for at least 72 hours (WITHOUT the use of  any tylenol, motrin or aleve) . Normal is < 100.4 Farenheit  3) your other flu  like symptoms are getting better.   Only  leave home to seek medical care and must wear a mask in public. Limit contact with family members or caregivers in the home and to notify her family who she was with you when you were sick that they should quarantine for 14 days. Practice social distancing and to continue to use good preventative care measures such has frequent hand washing.    I discussed the assessment and treatment plan with the patient. The patient was provided an opportunity to ask questions and all were answered. The patient agreed with the plan and demonstrated an understanding of the instructions.   The patient was advised to call back or seek an in-person evaluation if the symptoms worsen or if the condition fails to improve as anticipated.   Denice Paradise, NP Adult Nurse Practitioner [place] [phone]

## 2019-12-05 NOTE — Telephone Encounter (Signed)
Please call her for Covid sx update on Monday.

## 2019-12-05 NOTE — Progress Notes (Signed)
Virtual Visit via Video Note  I connected with@ on 12/05/19 at 11:30 AM EST by a video enabled telemedicine application and verified that I am speaking with the correct person using two identifiers. This visit type was conducted due to national recommendations for restrictions regarding the COVID-19 Pandemic (e.g. social distancing).  This format is felt to be most appropriate for this patient at this time.   I discussed the limitations of evaluation and management by telemedicine and the availability of in person appointments. The patient expressed understanding and agreed to proceed.  Only the patient and myself were on today's video visit. The patient was at home and I was in my office at the time of today's visit.  is a 64 y.o. female with a past medical history of breast cancer and thyroid cancer status post radiation chemotherapy not currently undergoing any active treatment, GERD, HTN, HDL, and hypothyroidism who presents for assessment of approximately 5 days of fevers, chills, weakness, cough, shortness of breath, nausea, and diarrhea.  Patient notes he was diagnosed with COVID-19 4 days ago and got an antibody infusion at an outpatient clinic 3 days ago.  She states the infusion did not help and she is emergency room but she feels is not getting better.  She denies any rash, chest pain aside from when she is coughing, hemoptysis, focal lower extremity edema or pain, tobacco abuse and EtOH use, also drug use, or any history of CAD or DVT/PE.  No clear alleviating aggravating factors.  No prior similar episodes.  Patient notes she was not vaccinated against Covid.    Overall constellation patient's symptoms and presentation is most consistent with COVID-19 pneumonia and some gastroenteritis with no evidence of dehydration given dry mucous membranes and decreased cap refill.  Very low suspicion for ACS given patient has no chest pain except when she is having a coughing episode and has a rather  recurrent EKG.  Chest x-ray shows no evidence of consolidative infection suggestive of bacterial ammonia patient is nonfebrile and of low suspicion for superinfection with bacteria at this time.  ECG shows no significant arrhythmia.  CMP shows evidence of some mild dehydration with a sodium of 122 but otherwise no significant ocular or metabolic derangements.  CBC is unremarkable and patient is no evidence of acute anemia.  Lipase is unremarkable and not consistent with pancreatitis.  UA does not appear infected and not consistent with cystitis or pyelonephritis.  Very low suspicion for PE at this time given absence of tachycardia, tachypnea, hypoxia and no history of hemoptysis, tobacco abuse, estrogen use, lower extremity edema swelling or other clear concerning factors on exam.  Patient given some IV fluids as well as analgesia and antiemetic as noted below.  Rx written for Zofran.  Patient discharged stable condition.  Strict impression sinusitis gastric  History of Present Illness:    Observations/Objective:   Assessment and Plan:  Gen: Awake, alert, no acute distress Resp: Breathing is even and non-labored Psych: calm/pleasant demeanor Neuro: Alert and Oriented x 3, + facial symmetry, speech is clear.   Follow Up Instructions:    I discussed the assessment and treatment plan with the patient. The patient was provided an opportunity to ask questions and all were answered. The patient agreed with the plan and demonstrated an understanding of the instructions.   The patient was advised to call back or seek an in-person evaluation if the symptoms worsen or if the condition fails to improve as anticipated.    Denice Paradise,  NP

## 2019-12-05 NOTE — Patient Instructions (Addendum)
Rest, hydrate very well, chicken broth for a few servings to increase sodium and then no longer needed. Eat healthy protein food, Tylenol or Advil as directed. You may utilize over the counter medications to treat other  Symptoms.  Call back if symptoms worsen or seek in -person care at Acute Care.  Seek  treatment in the emergency room if pulse oximeter is <90% and stays there or if respiratory issues/distress develops or unrelieved chest pain.   Please begin:   Vit C 1000 mg daily- IF it does not upset your stomach- stop it if it does  Vitamin D3 4000 IU daily  Zinc 100 g daily   I am sending 4 prescriptions to your pharmacy for chest tightness and slight wheezing:   1. Albuterol inhaler:  2 puffs every 6 hours as needed for wheezing.  (this will make your heart race a little)  2. Prednisone in a tapering  dose  Take 6 tablets all at once on day 1, then 5 tablets all at once on day 2,  and continue to reduce dose by 1 tablet daily until gone.   3. Tessalon Perles for daytime cough   4. Mucinex Dm for night time cough    You should continue your self imposed quarantine until you can answer "yes" to ALL 3 of the conditions below:   1) Your symptoms (fever, cough, shortness of breath) started 10 or more days ago   2) Your body temperature  has been normal for at least 72 hours (WITHOUT the use of any tylenol, motrin or aleve) . Normal is < 100.4 Farenheit  3) your other flu  like symptoms are getting better.   Only leave home to seek medical care and must wear a mask in public. Limit contact with family members or caregivers in the home and to notify her family who she was with you when you were sick that they should quarantine for 14 days. Practice social distancing and to continue to use good preventative care measures such has frequent hand washing.     10 Things You Can Do to Manage Your COVID-19 Symptoms at Home If you have possible or confirmed COVID-19: 1. Stay home from  work and school. And stay away from other public places. If you must go out, avoid using any kind of public transportation, ridesharing, or taxis. 2. Monitor your symptoms carefully. If your symptoms get worse, call your healthcare provider immediately. 3. Get rest and stay hydrated. 4. If you have a medical appointment, call the healthcare provider ahead of time and tell them that you have or may have COVID-19. 5. For medical emergencies, call 911 and notify the dispatch personnel that you have or may have COVID-19. 6. Cover your cough and sneezes with a tissue or use the inside of your elbow. 7. Wash your hands often with soap and water for at least 20 seconds or clean your hands with an alcohol-based hand sanitizer that contains at least 60% alcohol. 8. As much as possible, stay in a specific room and away from other people in your home. Also, you should use a separate bathroom, if available. If you need to be around other people in or outside of the home, wear a mask. 9. Avoid sharing personal items with other people in your household, like dishes, towels, and bedding. 10. Clean all surfaces that are touched often, like counters, tabletops, and doorknobs. Use household cleaning sprays or wipes according to the label instructions. [email or web address] 07/24/2018 This information  is not intended to replace advice given to you by your health care provider. Make sure you discuss any questions you have with your health care provider. Document Revised: 12/26/2018 Document Reviewed: 12/26/2018 Elsevier Patient Education  [place].

## 2019-12-08 ENCOUNTER — Other Ambulatory Visit: Payer: BC Managed Care – PPO

## 2019-12-08 NOTE — Telephone Encounter (Signed)
LMTCB

## 2019-12-09 ENCOUNTER — Telehealth: Payer: Self-pay | Admitting: Nurse Practitioner

## 2019-12-09 ENCOUNTER — Other Ambulatory Visit: Payer: Self-pay

## 2019-12-09 DIAGNOSIS — E785 Hyperlipidemia, unspecified: Secondary | ICD-10-CM

## 2019-12-09 MED ORDER — AZITHROMYCIN 250 MG PO TABS: ORAL_TABLET | ORAL | 0 refills | Status: DC

## 2019-12-09 MED ORDER — AMLODIPINE BESYLATE 5 MG PO TABS: 10.0000 mg | ORAL_TABLET | Freq: Every day | ORAL | 2 refills | Status: DC

## 2019-12-09 MED ORDER — EZETIMIBE 10 MG PO TABS: 10.0000 mg | ORAL_TABLET | Freq: Every day | ORAL | 1 refills | Status: DC

## 2019-12-09 NOTE — Telephone Encounter (Signed)
Tried to call but no answer and VM is full

## 2019-12-09 NOTE — Telephone Encounter (Signed)
Patient is scheduled for 12/11/19 at 8:00 am for VV

## 2019-12-09 NOTE — Telephone Encounter (Signed)
Patient is doing much better. Still having congestion, slight headache and sweating/burning up but no fever and this is mostly at night. Patient wants to know is maybe she needs an antibiotic?

## 2019-12-09 NOTE — Telephone Encounter (Signed)
Pt is requesting a Z Pak for cough and is taking 30 mg of prednisone on taper. Pred really helped. She has "weak lungs and always needs an antibiotic. " Coughing up phlegm- white, still using inhalers and breathing better. Getting over Covid . 99.9 temps. Has a VV with me on Thurs.

## 2019-12-09 NOTE — Telephone Encounter (Signed)
Can I see her with a video visit?

## 2019-12-11 ENCOUNTER — Other Ambulatory Visit: Payer: Self-pay

## 2019-12-11 ENCOUNTER — Telehealth (INDEPENDENT_AMBULATORY_CARE_PROVIDER_SITE_OTHER): Payer: BC Managed Care – PPO | Admitting: Nurse Practitioner

## 2019-12-11 ENCOUNTER — Encounter: Payer: BC Managed Care – PPO | Admitting: Nurse Practitioner

## 2019-12-11 VITALS — Temp 97.6°F | Ht 61.0 in | Wt 152.0 lb

## 2019-12-11 DIAGNOSIS — U071 COVID-19: Secondary | ICD-10-CM

## 2019-12-11 MED ORDER — DEXTROMETHORPHAN-GUAIFENESIN 5-100 MG/5ML PO LIQD: 10.0000 mL | Freq: Every evening | ORAL | 0 refills | Status: AC | PRN

## 2019-12-11 MED ORDER — BENZONATATE 200 MG PO CAPS: 200.0000 mg | ORAL_CAPSULE | Freq: Three times a day (TID) | ORAL | 0 refills | Status: AC | PRN

## 2019-12-11 NOTE — Progress Notes (Signed)
Virtual Visit via Video Note  This visit type was conducted due to national recommendations for restrictions regarding the COVID-19 pandemic (e.g. social distancing).  This format is felt to be most appropriate for this patient at this time.  All issues noted in this document were discussed and addressed.  No physical exam was performed (except for noted visual exam findings with Video Visits).   I connected with@ on 12/13/19 at 10:30 AM EST by a video enabled telemedicine application or telephone and verified that I am speaking with the correct person using two identifiers. Location patient: home Location provider: work or home office Persons participating in the virtual visit: patient, provider  I discussed the limitations, risks, security and privacy concerns of performing an evaluation and management service by telephone and the availability of in person appointments. I also discussed with the patient that there may be a patient responsible charge related to this service. The patient expressed understanding and agreed to proceed.  Reason for visit: Follow up Covid infection   HPI: This 64 year old patient with history of breast cancer, thyroid cancer, GERD, hypertension, hyperlipidemia, hypothyroidism, was diagnosed with Covid on 12/01/2019 and received the antibody infusion.  She was seen in video visit on 12/05/2019 and her GI symptoms were clearing up.  She was coughing at night with slight wheezing, SpO2 95 to 96%.  She had improved sore throat and headache and cough with a normal chest x-ray on 12/04/2019.  She has been taking vitamin C, vitamin D3 and zinc.  She has used albuterol inhaler, prednisone taper, Tessalon Perles, Mucinex DM for nighttime cough. She started a Z-Pak yesterday because her husband was diagnosed with bacterial pneumonia and she was concerned that she may have risk for pneumonia in addition to Covid.  Today, she reports that she is doing well overall but has more head  congestion now.  Little headache today- minor.  She is coughing up white mucus, and denies shortness of breath, wheezing, but chest sometimes feels a little tight.  She is finishing her prednisone taper today.  She thinks it really helped.  She is using albuterol 3 times a day.  Pulse ox is 97 to 98%.  She has no shortness of breath at rest, very slightly SOB with exertion.  No chest pain, pressure, heaviness,or  tightness.  No GI complaints.  Less fatigue.  No fevers noted.  ROS: See pertinent positives and negatives per HPI.  Past Medical History:  Diagnosis Date  . Anxiety   . Breast cancer of upper-inner quadrant of right female breast ([place]) 05/01/2013   Tubular carcinoma, T1b,N0,M0; ER/ PR 90%, her 2 neu not overexpressed.   . Cancer ([place]) 1995   thyroid  . Endometriosis 2009  . Fibula fracture 11/11/2015  . GERD (gastroesophageal reflux disease)   . Hyperlipidemia   . Hypertension   . Hypothyroidism   . Osteopenia    rt hip  . Personal history of radiation therapy 2015   mammosite  . Seasonal allergies   . Sleep apnea    does not wear CPAP    Past Surgical History:  Procedure Laterality Date  . ABDOMINAL HYSTERECTOMY  2005   Martin DeFrancesco,MD  . BREAST BIOPSY Right 04-02-13   INVASIVE MAMMARY CARCINOMA WITH FEATURES OF TUBULAR CARCINOMA,  . BREAST CYST EXCISION Left 12/02/2018   Procedure: EXCISION LEFT BREAST SEBACEOUS CYST;  Surgeon: Jovita Kussmaul, MD;  Location: [place];  Service: General;  Laterality: Left;  . BREAST LUMPECTOMY Right 04/2013  INVASIVE MAMMARY CARCINOMA WITH FEATURES OF TUBULAR CARCINOMA,  . BREAST SURGERY Right 05/01/13   wide excision  . colectomy  2009   secondary to endometriosis DR. Smith  . 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 No malignancy.  . COLONOSCOPY  2009   Dr. Tamala Julian  . COLONOSCOPY WITH PROPOFOL N/A 11/19/2018   Procedure: COLONOSCOPY WITH PROPOFOL;   Surgeon: Lucilla Lame, MD;  Location: [facility] ENDOSCOPY;  Service: Endoscopy;  Laterality: N/A;  . FOOT SURGERY  right  . MASTOPEXY Bilateral 01/03/2019   Procedure: MASTOPEXY;  Surgeon: Irene Limbo, MD;  Location: [place];  Service: Plastics;  Laterality: Bilateral;    Family History  Problem Relation Age of Onset  . Colon polyps Father        age 42  . Heart disease Father   . Bone cancer Father   . Heart disease Mother   . Rheumatic fever Mother   . Heart disease Paternal Grandmother   . Heart disease Paternal Grandfather   . Cancer Neg Hx   . Diabetes Neg Hx   . Breast cancer Neg Hx     SOCIAL HX: Never smoked   Current Outpatient Medications:  .  albuterol (VENTOLIN HFA) 108 (90 Base) MCG/ACT inhaler, Inhale 2 puffs into the lungs every 6 (six) hours as needed for wheezing or shortness of breath., Disp: 1 each, Rfl: 1 .  amLODipine (NORVASC) 5 MG tablet, Take 2 tablets (10 mg total) by mouth daily., Disp: 180 tablet, Rfl: 2 .  aspirin 81 MG tablet, Take 81 mg by mouth daily., Disp: , Rfl:  .  atorvastatin (LIPITOR) 40 MG tablet, TAKE ONE TABLET BY MOUTH DAILY, Disp: 90 tablet, Rfl: 0 .  atorvastatin (LIPITOR) 80 MG tablet, Take by mouth., Disp: , Rfl:  .  benzonatate (TESSALON) 200 MG capsule, Take 1 capsule (200 mg total) by mouth 3 (three) times daily as needed for up to 7 days for cough., Disp: 21 capsule, Rfl: 0 .  CALCIUM-MAGNESIUM-ZINC PO, Take by mouth 3 (three) times daily., Disp: , Rfl:  .  cetirizine (ZYRTEC) 10 MG tablet, SMARTSIG:1 Pill By Mouth Daily PRN, Disp: , Rfl:  .  Cholecalciferol (VITAMIN D-3 PO), Take 1,000 Units by mouth., Disp: , Rfl:  .  Dextromethorphan-guaiFENesin 5-100 MG/5ML LIQD, Take 10 mLs by mouth at bedtime as needed for up to 7 days (for cough)., Disp: 70 mL, Rfl: 0 .  escitalopram (LEXAPRO) 10 MG tablet, TAKE ONE TABLET BY MOUTH DAILY, Disp: 90 tablet, Rfl: 0 .  ezetimibe (ZETIA) 10 MG tablet, Take 1 tablet (10 mg  total) by mouth daily., Disp: 90 tablet, Rfl: 1 .  fluticasone (FLONASE) 50 MCG/ACT nasal spray, Place 2 sprays into both nostrils daily. , Disp: , Rfl:  .  GARLIC PO, Take 814 mg by mouth., Disp: , Rfl:  .  levothyroxine (SYNTHROID) 175 MCG tablet, Take 1 tablet (175 mcg total) by mouth daily before breakfast., Disp: 90 tablet, Rfl: 1 .  losartan (COZAAR) 100 MG tablet, Take 1 tablet (100 mg total) by mouth daily., Disp: 90 tablet, Rfl: 1 .  Multiple Vitamins-Minerals (MULTIVITAMIN WITH MINERALS) tablet, Take 1 tablet by mouth daily., Disp: , Rfl:  .  ondansetron (ZOFRAN) 4 MG tablet, Take 1 tablet (4 mg total) by mouth every 8 (eight) hours as needed for up to 10 doses for nausea or vomiting., Disp: 10 tablet, Rfl: 0 .  predniSONE (DELTASONE) 10 MG tablet,  Take 6 tablets ( 60 mg total) on day 1 and take 5 tablets (50 mg total) on day 2, and decrease by 1 tablet (10 mg) every day until off in 6 days., Disp: 21 tablet, Rfl: 0 .  traZODone (DESYREL) 50 MG tablet, TAKE 2 TABLETS (100 MG TOTAL) BY MOUTH AT BEDTIME AS NEEDED FOR SLEEP., Disp: 180 tablet, Rfl: 0 .  vitamin C (ASCORBIC ACID) 500 MG tablet, Take 500 mg by mouth daily., Disp: , Rfl:   EXAM:  VITALS per patient if applicable:  GENERAL: alert, oriented, appears well and in no acute distress  HEENT: atraumatic, conjunctiva clear, no obvious abnormalities on inspection of external nose and ears  NECK: normal movements of the head and neck  LUNGS: on inspection no signs of respiratory distress, breathing rate appears normal, no obvious gross SOB, gasping or wheezing  CV: no obvious cyanosis  MS: moves all visible extremities without noticeable abnormality  PSYCH/NEURO: pleasant and cooperative, no obvious depression or anxiety, speech and thought processing grossly intact  ASSESSMENT AND PLAN:  Discussed the following assessment and plan:  COVID-19 virus infection   XR today discussed but she had one last week for shortness of  breath and the film was normal. Next step is in person exam of the lungs. SPO2 is WNL. She seems to be getting better. Refilled Tessalon Perles and Mucinex DM as they work well. Stay on the [place]. Give [place] a call next week for symptom update.   I discussed the assessment and treatment plan with the patient. The patient was provided an opportunity to ask questions and all were answered. The patient agreed with the plan and demonstrated an understanding of the instructions.   The patient was advised to call back or seek an in-person evaluation if the symptoms worsen or if the condition fails to improve as anticipated.  Denice Paradise, NP Adult Nurse Practitioner [place] [phone]

## 2019-12-11 NOTE — Patient Instructions (Addendum)
CXR today discussed but you had one last week for shortness of breath and the film was normal. Next step is in person exam of the lungs.   Stay on the [place]   Refilled tesssalon and Mucinex works well

## 2019-12-13 ENCOUNTER — Encounter: Payer: Self-pay | Admitting: Nurse Practitioner

## 2019-12-13 NOTE — Progress Notes (Signed)
Erroneous entry

## 2019-12-16 ENCOUNTER — Telehealth: Payer: Self-pay | Admitting: Nurse Practitioner

## 2019-12-16 NOTE — Telephone Encounter (Signed)
Please call her for Covid symptom update.

## 2019-12-16 NOTE — Telephone Encounter (Signed)
LMTCB

## 2019-12-17 ENCOUNTER — Telehealth: Payer: Self-pay | Admitting: Family Medicine

## 2019-12-17 DIAGNOSIS — E890 Postprocedural hypothyroidism: Secondary | ICD-10-CM

## 2019-12-17 MED ORDER — LEVOTHYROXINE SODIUM 175 MCG PO TABS: 175.0000 ug | ORAL_TABLET | Freq: Every day | ORAL | 1 refills | Status: DC

## 2019-12-17 NOTE — Telephone Encounter (Signed)
Pt needs a refill on levothyroxine (SYNTHROID) 175 MCG tablet sent to [facility]

## 2019-12-17 NOTE — Telephone Encounter (Signed)
Patient is doing much better. Only has some congestion that is lingering .

## 2019-12-17 NOTE — Addendum Note (Signed)
Addended by: Elpidio Galea T on: 12/17/2019 09:35 AM   Modules accepted: Orders

## 2019-12-24 DIAGNOSIS — I208 Other forms of angina pectoris: Secondary | ICD-10-CM | POA: Diagnosis not present

## 2019-12-24 DIAGNOSIS — I6523 Occlusion and stenosis of bilateral carotid arteries: Secondary | ICD-10-CM | POA: Diagnosis not present

## 2019-12-24 DIAGNOSIS — I700 Atherosclerosis of aorta: Secondary | ICD-10-CM | POA: Diagnosis not present

## 2019-12-31 ENCOUNTER — Encounter: Payer: Self-pay | Admitting: Family Medicine

## 2019-12-31 ENCOUNTER — Ambulatory Visit (INDEPENDENT_AMBULATORY_CARE_PROVIDER_SITE_OTHER): Payer: BC Managed Care – PPO | Admitting: Family Medicine

## 2019-12-31 ENCOUNTER — Other Ambulatory Visit: Payer: Self-pay

## 2019-12-31 VITALS — BP 130/70 | HR 101 | Temp 97.3°F | Ht 61.0 in | Wt 156.0 lb

## 2019-12-31 DIAGNOSIS — I700 Atherosclerosis of aorta: Secondary | ICD-10-CM | POA: Diagnosis not present

## 2019-12-31 DIAGNOSIS — E785 Hyperlipidemia, unspecified: Secondary | ICD-10-CM

## 2019-12-31 DIAGNOSIS — G4700 Insomnia, unspecified: Secondary | ICD-10-CM

## 2019-12-31 DIAGNOSIS — Z8616 Personal history of COVID-19: Secondary | ICD-10-CM

## 2019-12-31 DIAGNOSIS — I6523 Occlusion and stenosis of bilateral carotid arteries: Secondary | ICD-10-CM | POA: Diagnosis not present

## 2019-12-31 DIAGNOSIS — Z0000 Encounter for general adult medical examination without abnormal findings: Secondary | ICD-10-CM | POA: Diagnosis not present

## 2019-12-31 DIAGNOSIS — R0789 Other chest pain: Secondary | ICD-10-CM | POA: Diagnosis not present

## 2019-12-31 DIAGNOSIS — E890 Postprocedural hypothyroidism: Secondary | ICD-10-CM

## 2019-12-31 DIAGNOSIS — R7303 Prediabetes: Secondary | ICD-10-CM

## 2019-12-31 DIAGNOSIS — Z853 Personal history of malignant neoplasm of breast: Secondary | ICD-10-CM

## 2019-12-31 DIAGNOSIS — Z0001 Encounter for general adult medical examination with abnormal findings: Secondary | ICD-10-CM

## 2019-12-31 DIAGNOSIS — M545 Low back pain, unspecified: Secondary | ICD-10-CM | POA: Insufficient documentation

## 2019-12-31 DIAGNOSIS — R002 Palpitations: Secondary | ICD-10-CM | POA: Diagnosis not present

## 2019-12-31 LAB — COMPREHENSIVE METABOLIC PANEL
ALT: 48 U/L — ABNORMAL HIGH (ref 0–35)
AST: 23 U/L (ref 0–37)
Albumin: 4.5 g/dL (ref 3.5–5.2)
Alkaline Phosphatase: 129 U/L — ABNORMAL HIGH (ref 39–117)
BUN: 14 mg/dL (ref 6–23)
CO2: 30 mEq/L (ref 19–32)
Calcium: 9.7 mg/dL (ref 8.4–10.5)
Chloride: 103 mEq/L (ref 96–112)
Creatinine, Ser: 0.63 mg/dL (ref 0.40–1.20)
GFR: 94.48 mL/min (ref >60.00)
Glucose, Bld: 114 mg/dL — ABNORMAL HIGH (ref 70–99)
Potassium: 4.1 mEq/L (ref 3.5–5.1)
Sodium: 140 mEq/L (ref 135–145)
Total Bilirubin: 0.7 mg/dL (ref 0.2–1.2)
Total Protein: 6.9 g/dL (ref 6.0–8.3)

## 2019-12-31 LAB — LIPID PANEL
Cholesterol: 138 mg/dL (ref 0–200)
HDL: 48.1 mg/dL (ref >39.00)
LDL Cholesterol: 69 mg/dL (ref 0–99)
NonHDL: 89.76
Total CHOL/HDL Ratio: 3
Triglycerides: 105 mg/dL (ref 0.0–149.0)
VLDL: 21 mg/dL (ref 0.0–40.0)

## 2019-12-31 LAB — HEMOGLOBIN A1C: Hgb A1c MFr Bld: 6.2 % (ref 4.6–6.5)

## 2019-12-31 LAB — TSH: TSH: 0.06 u[IU]/mL — ABNORMAL LOW (ref 0.35–4.50)

## 2019-12-31 MED ORDER — TRAZODONE HCL 50 MG PO TABS: 100.0000 mg | ORAL_TABLET | Freq: Every evening | ORAL | 0 refills | Status: DC | PRN

## 2019-12-31 NOTE — Assessment & Plan Note (Signed)
Benign exam. Refer for PT. If not improving in the next 2-3 weeks she will contact us to get an x-ray.

## 2019-12-31 NOTE — Assessment & Plan Note (Signed)
Physical exam completed. Encouraged continued healthy diet and exercise. She will proceed with her yearly mammogram. Pap smear is up to date. Discussed screening next year and that she likely would not need any more moving forward after that given her total hysterectomy status. Discussed flu and covid vaccines and the patient declines them. She notes he oncologist at [facility] advised against taking the COVID vaccine due to unknown risks with her history of cancer and her oncologist here advised her to take the vaccine. She has made a decision to not get the vaccine. Labs as outlined.

## 2019-12-31 NOTE — Progress Notes (Signed)
[name], MD Phone: [phone]  Christie Ramirez is a 64 y.o. female who presents today for CPE.  Diet: healthy, made changes to diet to include mostly lean meats and veggies Exercise: walks daily Pap smear: 07/21/15, NILM neg HPV, has a history of HPV in 2004, s/p hysterectomy around 2004 Colonoscopy: 11/19/18 negative Mammogram: scheduled Family history-  Colon cancer: no  Breast cancer: no  Ovarian cancer: no Menses: s/p hysterectomy and BSO Vaccines-   Flu: declines  Tetanus: UTD  COVID19: declines HIV screening: UTD Hep C Screening: UTD Tobacco use: no Alcohol use: no Illicit Drug use: no Dentist: yes Ophthalmology: yes  Back pain: onset a couple of weeks ago in her right low back. Then spread to her bilateral low back. Hurts if she twists a certain way. No injury. No numbness, weakness, incontinence, or radiation. Has taken some ibuprofen.   Also reports she has seen cardiology and had a stress test given her family history of cardiac issues.   History of COVID: has recovered well. Occasional tightness in her chest and continues to lack smell and taste, though overall improving.    Active Ambulatory Problems    Diagnosis Date Noted  . Hypothyroidism 10/12/2010  . Hypertension 05/18/2011  . Anxiety 05/18/2011  . Hyperlipidemia with target low density lipoprotein (LDL) cholesterol less than 100 mg/dL 06/26/2011  . Insomnia 06/09/2013  . Colon cancer screening 07/28/2013  . OSA (obstructive sleep apnea) 09/19/2013  . History of herpes genitalis 09/23/2014  . Endometriosis 09/23/2014  . Chronic bronchitis ([place]) 01/29/2015  . Right hip pain 08/11/2015  . Osteopenia 08/11/2015  . Fatigue 10/01/2015  . Acute pain of right shoulder 07/06/2016  . Acute pain of left knee 07/27/2016  . Neck pain 10/30/2016  . Encounter for general adult medical examination with abnormal findings 02/03/2017  . Palpitations 02/03/2017  . Rectal bleeding 02/03/2017  . Night  sweats 02/03/2017  . Carpal tunnel syndrome 02/03/2017  . GERD (gastroesophageal reflux disease) 02/03/2017  . Carotid artery plaque 03/05/2017  . Left shoulder pain 08/25/2018  . Pain of left calf 08/25/2018  . Breast lesion 10/19/2018  . History of thyroid cancer 10/19/2018  . Abdominal pain 10/19/2018  . History of breast cancer 10/19/2018  . Hoarseness 10/19/2018  . Left foot pain 11/06/2018  . 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 Lipoma of thigh 03/21/2018  . Prediabetes 11/30/2017  . Rupture of hamstring tendon 03/08/2018  . Vitamin D deficiency 12/12/2018  . Elevated alanine aminotransferase (ALT) level 11/07/2019  . H/O ongoing treatment with raloxifene 11/07/2019  . History of COVID-19 12/05/2019  . Atherosclerosis of abdominal aorta ([place]) 11/26/2019  . Stable angina pectoris ([place]) 11/26/2019  . Acute bilateral low back pain without sciatica 12/31/2019   Resolved Ambulatory Problems    Diagnosis Date Noted  . Hyperlipidemia 10/12/2010  . Depression 10/12/2010  . Burn of hand, left, second degree 02/21/2011  . Numbness and tingling in hands 02/21/2011  . Bronchitis with airway obstruction ([place]) 02/14/2012  . Ankle injury 08/16/2012  . Elevated liver function tests 11/18/2012  . Screening for breast cancer 11/18/2012  . Mid back pain 01/02/2013  . Routine general medical examination at a health care facility 02/18/2013  . Preop general physical exam 02/19/2013  . Abnormal mammogram 03/28/2013  . Breast cancer of upper-inner quadrant of right female breast ([place]) 04/08/2013  . Acute nonsuppurative otitis media of right ear 07/22/2013  . Other malaise and fatigue 07/22/2013  .  Burn of hand, left, second degree 12/12/2013  . Piriformis syndrome of left side 07/17/2014  . Acute URI 10/19/2014  . Dysuria 10/22/2014  . UTI (urinary tract infection) 10/22/2014  . Yeast infection 10/22/2014  . Trichomonal infection 11/06/2014  .  Trapezius strain 12/16/2014  . Bronchitis 01/04/2015  . Ankle fracture, left 02/07/2015  . Low back pain 08/11/2015  . Lip swelling 08/19/2015  . Headache 08/20/2015  . Bronchitis 10/19/2015  . Fibula fracture 11/11/2015  . Encounter for screening colonoscopy   . Acute rhinosinusitis 07/20/2018  . Benign essential hypertension 11/30/2017  . Generalized anxiety disorder 06/11/2018   Past Medical History:  Diagnosis Date  . Cancer ([place]) 1995  . Personal history of radiation therapy 2015  . Seasonal allergies   . Sleep apnea     Family History  Problem Relation Age of Onset  . Colon polyps Father        age 75  . Heart disease Father   . Bone cancer Father   . Heart disease Mother   . Rheumatic fever Mother   . Heart disease Paternal Grandmother   . Heart disease Paternal Grandfather   . Cancer Neg Hx   . Diabetes Neg Hx   . Breast cancer Neg Hx     Social History   Socioeconomic History  . Marital status: Married    Spouse name: Not on file  . Number of children: Not on file  . Years of education: Not on file  . Highest education level: Not on file  Occupational History  . Not on file  Tobacco Use  . Smoking status: Never Smoker  . Smokeless tobacco: Never Used  Vaping Use  . Vaping Use: Never used  Substance and Sexual Activity  . Alcohol use: Not Currently    Alcohol/week: 0.0 standard drinks    Comment: wine socially  . Drug use: No  . Sexual activity: Yes    Birth control/protection: Surgical  Other Topics Concern  . 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 Difficulty of Paying Living Expenses: Not on file  Food  Insecurity:   . Worried About Charity fundraiser in the Last Year: Not on file  . Ran Out of Food in the Last Year: Not on file  Transportation Needs:   . Lack of Transportation (Medical): Not on file  . Lack of Transportation (Non-Medical): Not on file  Physical Activity:   . Days of Exercise per Week: Not on file  . Minutes of Exercise per Session: Not on file  Stress:   . Feeling of Stress : Not on file  Social Connections:   . Frequency of Communication with Friends and Family: Not on file  . Frequency of Social Gatherings with Friends and Family: Not on file  . Attends Religious Services: Not on file  . Active Member of Clubs or Organizations: Not on file  . Attends Archivist Meetings: Not on file  . Marital Status: Not on file  Intimate Partner Violence:   . Fear of Current or Ex-Partner: Not on file  . Emotionally Abused:  Not on file  . Physically Abused: Not on file  . Sexually Abused: Not on file    ROS  General:  Negative for nexplained weight loss, fever Skin: Negative for new or changing mole, sore that won't heal HEENT: Negative for trouble hearing, trouble seeing, ringing in ears, mouth sores, hoarseness, change in voice, dysphagia. CV:  Negative for chest pain, dyspnea, edema, palpitations Resp: Negative for cough, dyspnea, hemoptysis GI: Negative for nausea, vomiting, diarrhea, constipation, abdominal pain, melena, hematochezia. GU: Negative for dysuria, incontinence, urinary hesitance, hematuria, vaginal or penile discharge, polyuria, sexual difficulty, lumps in testicle or breasts MSK: Negative for muscle cramps or aches, joint pain or swelling Neuro: Negative for headaches, weakness, numbness, dizziness, passing out/fainting Psych: Negative for depression, anxiety, memory problems  Objective  Physical Exam Vitals:   12/31/19 0924  BP: 130/70  Pulse: (!) 101  Temp: (!) 97.3 F (36.3 C)  SpO2: 95%    BP Readings from Last 3 Encounters:   12/31/19 130/70  12/04/19 (!) 143/93  12/02/19 (!) 158/102   Wt Readings from Last 3 Encounters:  12/31/19 156 lb (70.8 kg)  12/11/19 152 lb (68.9 kg)  12/05/19 155 lb (70.3 kg)    Physical Exam Constitutional:      General: She is not in acute distress.    Appearance: She is not diaphoretic.  HENT:     Head: Normocephalic and atraumatic.  Eyes:     Conjunctiva/sclera: Conjunctivae normal.     Pupils: Pupils are equal, round, and reactive to light.  Cardiovascular:     Rate and Rhythm: Normal rate and regular rhythm.     Heart sounds: Normal heart sounds.  Pulmonary:     Effort: Pulmonary effort is normal.     Breath sounds: Normal breath sounds.  Abdominal:     General: Bowel sounds are normal. There is no distension.     Palpations: Abdomen is soft.     Tenderness: There is no abdominal tenderness. There is no guarding or rebound.  Musculoskeletal:     Right lower leg: No edema.     Left lower leg: No edema.     Comments: No midline spine tenderness or step off, no muscular back tenderness  Skin:    General: Skin is warm and dry.  Neurological:     Mental Status: She is alert.     Comments: 5/5 strength quads, hamstrings, plantar flexion and dorsiflexion, sensation to light touch intact bilateral LE  Psychiatric:        Mood and Affect: Mood normal.      Assessment/Plan:   Problem List Items Addressed This Visit    Acute bilateral low back pain without sciatica    Benign exam. Refer for PT. If not improving in the next 2-3 weeks she will contact us to get an x-ray.       Relevant Orders   Ambulatory referral to Physical Therapy   Encounter for general adult medical examination with abnormal findings - Primary    Physical exam completed. Encouraged continued healthy diet and exercise. She will proceed with her yearly mammogram. Pap smear is up to date. Discussed screening next year and that she likely would not need any more moving forward after that given her  total hysterectomy status. Discussed flu and covid vaccines and the patient declines them. She notes he oncologist at [facility] advised against taking the COVID vaccine due to unknown risks with her history of cancer and her oncologist here advised her to take  the vaccine. She has made a decision to not get the vaccine. Labs as outlined.       History of breast cancer   History of COVID-19    Progressively recovering. Discussed it could take months to fully recover. She will monitor.       Hyperlipidemia with target low density lipoprotein (LDL) cholesterol less than 100 mg/dL   Relevant Orders   Comp Met (CMET)   Lipid panel   Hypothyroidism   Relevant Orders   TSH   Insomnia   Relevant Medications   traZODone (DESYREL) 50 MG tablet   Prediabetes   Relevant Orders   HgB A1c      This visit occurred during the SARS-CoV-2 public health emergency.  Safety protocols were in place, including screening questions prior to the visit, additional usage of staff PPE, and extensive cleaning of exam room while observing appropriate contact time as indicated for disinfecting solutions.    [name], MD [place]

## 2019-12-31 NOTE — Assessment & Plan Note (Signed)
Progressively recovering. Discussed it could take months to fully recover. She will monitor.

## 2019-12-31 NOTE — Patient Instructions (Signed)
Nice to see you.  We will get labs today. Congrats on the weight loss. Please continue with healthy diet and exercise.  Someone will contact you regarding PT for your back. If your pain does not improve over the next 2-3 weeks please let me know so we can get imaging.

## 2020-01-05 ENCOUNTER — Other Ambulatory Visit: Payer: BC Managed Care – PPO

## 2020-01-05 ENCOUNTER — Inpatient Hospital Stay: Payer: BC Managed Care – PPO | Attending: Oncology

## 2020-01-05 DIAGNOSIS — Z853 Personal history of malignant neoplasm of breast: Secondary | ICD-10-CM

## 2020-01-05 DIAGNOSIS — Z9223 Personal history of estrogen therapy: Secondary | ICD-10-CM | POA: Diagnosis not present

## 2020-01-05 DIAGNOSIS — Z170 Estrogen receptor positive status [ER+]: Secondary | ICD-10-CM | POA: Diagnosis not present

## 2020-01-05 LAB — HEPATIC FUNCTION PANEL
ALT: 48 U/L — ABNORMAL HIGH (ref 0–44)
AST: 27 U/L (ref 15–41)
Albumin: 4.6 g/dL (ref 3.5–5.0)
Alkaline Phosphatase: 107 U/L (ref 38–126)
Bilirubin, Direct: 0.1 mg/dL (ref 0.0–0.2)
Indirect Bilirubin: 0.8 mg/dL (ref 0.3–0.9)
Total Bilirubin: 0.9 mg/dL (ref 0.3–1.2)
Total Protein: 7.8 g/dL (ref 6.5–8.1)

## 2020-01-06 ENCOUNTER — Telehealth: Payer: Self-pay

## 2020-01-06 ENCOUNTER — Telehealth: Payer: Self-pay | Admitting: Family Medicine

## 2020-01-06 DIAGNOSIS — R7401 Elevation of levels of liver transaminase levels: Secondary | ICD-10-CM

## 2020-01-06 NOTE — Telephone Encounter (Signed)
Done.. [place] has been sched  as requested. [place] on 01/12/20 at [facility] @ 9am A detailed message was left on pts VM making her aware.

## 2020-01-06 NOTE — Telephone Encounter (Signed)
-----   Message from Earlie Server, MD sent at 01/05/2020  9:40 PM EST -----  ALT is persistently elevated but stable at 48.  Possibly secondary to Lipitor.  With her history of breast cancer, I Recommend patient to have ultrasound limited right upper quadrant.  Please order and help her to arrange that.  Thank you

## 2020-01-06 NOTE — Telephone Encounter (Signed)
Noted. Agree with evaluation need.

## 2020-01-06 NOTE — Telephone Encounter (Signed)
Due to no available appointments, instructed patient to go to UC. She stated she will go to [facility] across from PCP.

## 2020-01-06 NOTE — Telephone Encounter (Signed)
Patient has been notified. Please schedule [place] and call pt with appts details. Thanks

## 2020-01-06 NOTE — Telephone Encounter (Signed)
Nurse line transferred pt to our office to schedule an appt within the next 24 hours  For Covid like symptom, cold,cough, mucus  Transferred to [facility]

## 2020-01-06 NOTE — Telephone Encounter (Signed)
Patient stated she was around a little girl that had similar sx. Sunday she started having sx. This morning she is having  ear ache, loss of voice, nasal congestion,cough, vomiting, chest congestion,and sore throat. No fever, chills, and nausea.

## 2020-01-09 ENCOUNTER — Encounter: Payer: BC Managed Care – PPO | Admitting: Podiatry

## 2020-01-09 ENCOUNTER — Ambulatory Visit
Admission: EM | Admit: 2020-01-09 | Discharge: 2020-01-09 | Disposition: A | Discharge status: Home / Self Care | Payer: BC Managed Care – PPO | Attending: Family Medicine | Admitting: Family Medicine

## 2020-01-09 DIAGNOSIS — J0110 Acute frontal sinusitis, unspecified: Secondary | ICD-10-CM

## 2020-01-09 MED ORDER — AMOXICILLIN-POT CLAVULANATE 875-125 MG PO TABS: 1.0000 | ORAL_TABLET | Freq: Two times a day (BID) | ORAL | 0 refills | Status: DC

## 2020-01-09 NOTE — Discharge Instructions (Signed)
Take the amoxicillin as prescribed.  Over-the-counter medicines as needed for symptoms Follow up as needed for continued or worsening symptoms

## 2020-01-09 NOTE — ED Provider Notes (Signed)
Roderic Palau    CSN: [id number] Arrival date & time: 01/09/20  1025      History   Chief Complaint Chief Complaint  Patient presents with   Ear Pain    HPI Christie Ramirez is a 64 y.o. female.   Patient is a 64 year old female who presents today with, ear pain, sore throat, sinus congestion, sinus pressure, postnasal drip, cough and headache.  Is been present worsening for the past 7 days.  Christie Ramirez has been using multiple over-the-counter medications and her daily allergy medicine without any relief.  No chest pain, shortness of breath.     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    Cancer [facility]) 1995   thyroid   Endometriosis 2009   Fibula fracture 11/11/2015   GERD (gastroesophageal reflux disease)    Hyperlipidemia    Hypertension    Hypothyroidism    Osteopenia    rt hip   Personal history of radiation therapy 2015   mammosite   Seasonal allergies    Sleep apnea    does not wear CPAP    Patient Active Problem List   Diagnosis Date Noted   Acute bilateral low back pain without sciatica 12/31/2019   History of COVID-19 12/05/2019   Atherosclerosis of abdominal aorta ([place]) 11/26/2019   Stable angina pectoris ([place]) 11/26/2019   Elevated alanine aminotransferase (ALT) level 11/07/2019   H/O ongoing treatment with raloxifene 11/07/2019   Vitamin D deficiency 12/12/2018   Left foot pain 11/06/2018   Breast lesion 10/19/2018   History of thyroid cancer 10/19/2018   Abdominal pain 10/19/2018   History of breast cancer 10/19/2018   Hoarseness 10/19/2018   Left shoulder pain 08/25/2018   Pain of left calf 08/25/2018   Lipoma of thigh 03/21/2018   Rupture of hamstring tendon 03/08/2018   Prediabetes 11/30/2017   Carotid artery plaque 03/05/2017   Encounter for  general adult medical examination with abnormal findings 02/03/2017   Palpitations 02/03/2017   Rectal bleeding 02/03/2017   Night sweats 02/03/2017   Carpal tunnel syndrome 02/03/2017   GERD (gastroesophageal reflux disease) 02/03/2017   Neck pain 10/30/2016   Acute pain of left knee 07/27/2016   Acute pain of right shoulder 07/06/2016   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 11/10/2015   Fatigue 10/01/2015   Right hip pain 08/11/2015   Osteopenia 08/11/2015   Chronic bronchitis ([place]) 01/29/2015   History of herpes genitalis 09/23/2014   Endometriosis 09/23/2014   OSA (obstructive sleep apnea) 09/19/2013   Colon cancer screening 07/28/2013   Insomnia 06/09/2013   Hyperlipidemia with target low density lipoprotein (LDL) cholesterol less than 100 mg/dL 06/26/2011   Hypertension 05/18/2011   Anxiety 05/18/2011   Hypothyroidism 10/12/2010    Past Surgical History:  Procedure Laterality Date   ABDOMINAL HYSTERECTOMY  2005   Martin DeFrancesco,MD   BREAST BIOPSY Right 04-02-13   INVASIVE MAMMARY CARCINOMA WITH FEATURES OF TUBULAR CARCINOMA,   BREAST CYST EXCISION Left 12/02/2018   Procedure: EXCISION LEFT BREAST SEBACEOUS CYST;  Surgeon: Jovita Kussmaul, MD;  Location: [place];  Service: General;  Laterality: Left;   BREAST LUMPECTOMY Right 04/2013   INVASIVE MAMMARY CARCINOMA WITH FEATURES OF TUBULAR CARCINOMA,   BREAST SURGERY Right 05/01/13   wide excision   colectomy  2009  secondary to endometriosis DR. Tamala Julian   COLON SURGERY  December 2008   right hemicolectomy for cecal mass identified as endometrioma. No malignancy.   COLONOSCOPY  2009   Dr. Tamala Julian   COLONOSCOPY WITH PROPOFOL N/A 11/19/2018   Procedure: COLONOSCOPY WITH PROPOFOL;  Surgeon: Lucilla Lame, MD;  Location[facility];  Service: Endoscopy;  Laterality: N/A;   FOOT SURGERY  right   MASTOPEXY Bilateral 01/03/2019   Procedure: MASTOPEXY;   Surgeon: Irene Limbo, MD;  Location: [place];  Service: Plastics;  Laterality: Bilateral;    OB History    Gravida  1   Para  1   Term      Preterm  1   AB      Living        SAB      IAB      Ectopic      Multiple      Live Births               Home Medications    Prior to Admission medications   Medication Sig Start Date End Date Taking? Authorizing Provider  albuterol (VENTOLIN HFA) 108 (90 Base) MCG/ACT inhaler Inhale 2 puffs into the lungs every 6 (six) hours as needed for wheezing or shortness of breath. 12/05/19   [name], NP  amLODipine (NORVASC) 5 MG tablet Take 2 tablets (10 mg total) by mouth daily. 12/09/19 09/04/20  Leone Haven, MD  amoxicillin-clavulanate (AUGMENTIN) 875-125 MG tablet Take 1 tablet by mouth every 12 (twelve) hours. 01/09/20   Loura Halt A, NP  aspirin 81 MG tablet Take 81 mg by mouth daily.    [provider]  atorvastatin (LIPITOR) 40 MG tablet TAKE ONE TABLET BY MOUTH DAILY 11/04/19   Leone Haven, MD  atorvastatin (LIPITOR) 80 MG tablet Take by mouth. 11/26/19 11/25/20  [provider]  CALCIUM-MAGNESIUM-ZINC PO Take by mouth 3 (three) times daily.    [provider]  cetirizine (ZYRTEC) 10 MG tablet SMARTSIG:1 Pill By Mouth Daily PRN 11/07/18   [provider]  Cholecalciferol (VITAMIN D-3 PO) Take 1,000 Units by mouth.    [provider]  escitalopram (LEXAPRO) 10 MG tablet TAKE ONE TABLET BY MOUTH DAILY 10/15/19   Leone Haven, MD  ezetimibe (ZETIA) 10 MG tablet Take 1 tablet (10 mg total) by mouth daily. 12/09/19   Leone Haven, MD  fluticasone (FLONASE) 50 MCG/ACT nasal spray Place 2 sprays into both nostrils daily.  11/07/18   [provider]  GARLIC PO Take 233 mg by mouth.    [provider]  levothyroxine (SYNTHROID) 175 MCG tablet Take 1 tablet (175 mcg total) by mouth daily before breakfast. 12/17/19    Leone Haven, MD  losartan (COZAAR) 100 MG tablet Take 1 tablet (100 mg total) by mouth daily. 03/05/19   Leone Haven, MD  Multiple Vitamins-Minerals (MULTIVITAMIN WITH MINERALS) tablet Take 1 tablet by mouth daily.    [provider]  ondansetron (ZOFRAN) 4 MG tablet Take 1 tablet (4 mg total) by mouth every 8 (eight) hours as needed for up to 10 doses for nausea or vomiting. 12/04/19   Lucrezia Starch, MD  predniSONE (DELTASONE) 10 MG tablet Take 6 tablets ( 60 mg total) on day 1 and take 5 tablets (50 mg total) on day 2, and decrease by 1 tablet (10 mg) every day until off in 6 days. 12/05/19   [name],  NP  traZODone (DESYREL) 50 MG tablet Take 2 tablets (100 mg total) by mouth at bedtime as needed for sleep. 12/31/19   Leone Haven, MD  vitamin C (ASCORBIC ACID) 500 MG tablet Take 500 mg by mouth daily.    [provider]  clonazePAM (KLONOPIN) 1 MG tablet Take 1 tablet (1 mg total) by mouth daily as needed for anxiety. Patient not taking: Reported on 11/06/2019 12/12/18 12/04/19  McLean-Scocuzza, Nino Glow, MD  gabapentin (NEURONTIN) 300 MG capsule Take 1 capsule (300 mg total) by mouth at bedtime. Patient not taking: Reported on 11/06/2019 02/04/19 12/04/19  Edrick Kins, DPM    Family History Family History  Problem Relation Age of Onset   Colon polyps Father        age 58   Heart disease Father    Bone cancer Father    Heart disease Mother    Rheumatic fever Mother    Heart disease Paternal Grandmother    Heart disease Paternal Grandfather    Cancer Neg Hx    Diabetes Neg Hx    Breast cancer Neg Hx     Social History Social History   Tobacco Use   Smoking status: Never Smoker   Smokeless tobacco: Never Used  Scientific laboratory technician Use: Never used  Substance Use Topics   Alcohol use: Not Currently    Alcohol/week: 0.0 standard drinks    Comment: wine socially   Drug use: No     Allergies   Codeine, Codeine,  Iodinated diagnostic agents, Iodine, Iodine, Penicillins, and Penicillins   Review of Systems Review of Systems   Physical Exam Triage Vital Signs ED Triage Vitals  Enc Vitals Group     BP 01/09/20 1046 (!) 156/106     Pulse Rate 01/09/20 1046 89     Resp 01/09/20 1046 16     Temp 01/09/20 1046 97.6 F (36.4 C)     Temp Source 01/09/20 1046 Oral     SpO2 01/09/20 1046 97 %     Weight 01/09/20 1047 156 lb (70.8 kg)     Height 01/09/20 1047 5\' 1"  (1.549 m)     Head Circumference --      Peak Flow --      Pain Score 01/09/20 1047 7     Pain Loc --      Pain Edu? --      Excl. in [place]? --    No data found.  Updated Vital Signs BP (!) 156/106    Pulse 89    Temp 97.6 F (36.4 C) (Oral)    Resp 16    Ht 5\' 1"  (1.549 m)    Wt 156 lb (70.8 kg)    SpO2 97%    BMI 29.48 kg/m   Visual Acuity Right Eye Distance:   Left Eye Distance:   Bilateral Distance:    Right Eye Near:   Left Eye Near:    Bilateral Near:     Physical Exam Vitals and nursing note reviewed.  Constitutional:      General: Christie Ramirez is not in acute distress.    Appearance: Normal appearance. Christie Ramirez is not ill-appearing, toxic-appearing or diaphoretic.  HENT:     Head: Normocephalic.     Ears:     Comments: Bilateral TM injection    Nose: Congestion and rhinorrhea present.     Right Sinus: Maxillary sinus tenderness present.     Left Sinus: Maxillary sinus tenderness present.  Mouth/Throat:     Pharynx: Oropharynx is clear.  Eyes:     Conjunctiva/sclera: Conjunctivae normal.  Pulmonary:     Effort: Pulmonary effort is normal.  Musculoskeletal:        General: Normal range of motion.     Cervical back: Normal range of motion.  Skin:    General: Skin is warm and dry.     Findings: No rash.  Neurological:     Mental Status: Christie Ramirez is alert.  Psychiatric:        Mood and Affect: Mood normal.      UC Treatments / Results  Labs (all labs ordered are listed, but only abnormal results are displayed) Labs  Reviewed - No data to display  EKG   Radiology No results found.  Procedures Procedures (including critical care time)  Medications Ordered in UC Medications - No data to display  Initial Impression / Assessment and Plan / UC Course  I have reviewed the triage vital signs and the nursing notes.  Pertinent labs & imaging results that were available during my care of the patient were reviewed by me and considered in my medical decision making (see chart for details).     Sinusitis Patient with approximately 7 days of worsening symptoms We will go ahead and cover for bacterial sinusitis at this time.  Amoxicillin as prescribed. Continue the over-the-counter medicines for symptoms as needed. Follow up as needed for continued or worsening symptoms'  Final Clinical Impressions(s) / UC Diagnoses   Final diagnoses:  Acute non-recurrent frontal sinusitis     Discharge Instructions     Take the amoxicillin as prescribed.  Over-the-counter medicines as needed for symptoms Follow up as needed for continued or worsening symptoms     ED Prescriptions    Medication Sig Dispense Auth. Provider   amoxicillin-clavulanate (AUGMENTIN) 875-125 MG tablet Take 1 tablet by mouth every 12 (twelve) hours. 14 tablet Rodney Wigger A, NP     PDMP not reviewed this encounter.   Orvan July, NP 01/09/20 1116

## 2020-01-09 NOTE — ED Triage Notes (Signed)
Pt reports having ear pain, sore throat and 1 episode of emesis since Saturday. sts last night she it was painful to swallow and nasal drip. Also reports having productive cough, sinus pain and headache. sts she have been taking cough med. Covid+ Nov. 8.

## 2020-01-12 ENCOUNTER — Other Ambulatory Visit: Payer: Self-pay

## 2020-01-12 ENCOUNTER — Ambulatory Visit
Admission: RE | Admit: 2020-01-12 | Discharge: 2020-01-12 | Disposition: A | Discharge status: Home / Self Care | Payer: BC Managed Care – PPO | Source: Ambulatory Visit | Attending: Oncology | Admitting: Oncology

## 2020-01-12 DIAGNOSIS — R7401 Elevation of levels of liver transaminase levels: Secondary | ICD-10-CM

## 2020-01-12 DIAGNOSIS — K7689 Other specified diseases of liver: Secondary | ICD-10-CM | POA: Diagnosis not present

## 2020-01-15 ENCOUNTER — Telehealth: Payer: Self-pay

## 2020-01-15 NOTE — Telephone Encounter (Signed)
-----   Message from Leone Haven, MD sent at 01/08/2020 10:19 AM EST ----- Please let the patient know that her TSH is low. Has she changed how she is taking her synthroid? Her weight loss has likely contributed to the the lower TSH as her body may not need as much of the synthroid. I would suggest decreasing her synthroid to 150 mcg daily and rechecking her TSH in 6 weeks. I can order after you speak with her. Her A1c is in the prediabetic range. Her liver enzymes are mildly elevated possibly related to the statin. I would suggest rechecking those in 3 weeks.

## 2020-01-19 ENCOUNTER — Telehealth: Payer: Self-pay

## 2020-01-19 DIAGNOSIS — Z853 Personal history of malignant neoplasm of breast: Secondary | ICD-10-CM

## 2020-01-19 DIAGNOSIS — E039 Hypothyroidism, unspecified: Secondary | ICD-10-CM

## 2020-01-19 DIAGNOSIS — E890 Postprocedural hypothyroidism: Secondary | ICD-10-CM

## 2020-01-19 MED ORDER — LEVOTHYROXINE SODIUM 150 MCG PO TABS: 150.0000 ug | ORAL_TABLET | Freq: Every day | ORAL | 0 refills | Status: DC

## 2020-01-19 NOTE — Telephone Encounter (Signed)
pt called on 11/10 needing to reschedule mammogram and lab only appt, which have been rescheduled. Per last office visit in October, she is to return in April 2022 (already scheduled). Did you want to see her sooner in the 3-4 weeks or keep appts as is?

## 2020-01-19 NOTE — Telephone Encounter (Signed)
-----   Message from Rickard Patience, MD sent at 01/15/2020  4:07 PM EST ----- US liver result is good. She needs follow up appt rescheduled. - previously posponed due to infection. Lab md in the next 3-4 weeks.

## 2020-01-19 NOTE — Telephone Encounter (Signed)
Keep appts as scheduled, per MD

## 2020-01-19 NOTE — Telephone Encounter (Signed)
-----   Message from Glori Luis, MD sent at 01/08/2020 10:19 AM EST ----- Please let the patient know that her TSH is low. Has she changed how she is taking her synthroid? Her weight loss has likely contributed to the the lower TSH as her body may not need as much of the synthroid. I would suggest decreasing her synthroid to 150 mcg daily and rechecking her TSH in 6 weeks. I can order after you speak with her. Her A1c is in the prediabetic range. Her liver enzymes are mildly elevated possibly related to the statin. I would suggest rechecking those in 3 weeks.

## 2020-01-19 NOTE — Telephone Encounter (Signed)
I called the patient and informed her of her lab results and she understood.  I ordered the thyroid medication per Dr. Birdie Sons at a decrease for 150 mcg one per day. I also ordered her future labs.  Jerrick Farve,cma

## 2020-01-20 ENCOUNTER — Encounter: Payer: BC Managed Care – PPO | Admitting: Podiatry

## 2020-01-22 ENCOUNTER — Ambulatory Visit
Admission: RE | Admit: 2020-01-22 | Discharge: 2020-01-22 | Disposition: A | Discharge status: Home / Self Care | Payer: BC Managed Care – PPO | Source: Ambulatory Visit | Attending: Oncology | Admitting: Oncology

## 2020-01-22 ENCOUNTER — Other Ambulatory Visit: Payer: Self-pay

## 2020-01-22 DIAGNOSIS — Z79810 Long term (current) use of selective estrogen receptor modulators (SERMs): Secondary | ICD-10-CM | POA: Diagnosis not present

## 2020-01-22 DIAGNOSIS — N6311 Unspecified lump in the right breast, upper outer quadrant: Secondary | ICD-10-CM | POA: Diagnosis not present

## 2020-01-22 DIAGNOSIS — Z853 Personal history of malignant neoplasm of breast: Secondary | ICD-10-CM

## 2020-01-22 DIAGNOSIS — R922 Inconclusive mammogram: Secondary | ICD-10-CM | POA: Diagnosis not present

## 2020-01-22 HISTORY — DX: Malignant neoplasm of unspecified site of unspecified female breast: C50.919

## 2020-02-03 ENCOUNTER — Encounter: Payer: BC Managed Care – PPO | Admitting: Podiatry

## 2020-02-03 ENCOUNTER — Ambulatory Visit: Payer: BC Managed Care – PPO | Attending: Family Medicine

## 2020-02-04 ENCOUNTER — Other Ambulatory Visit: Payer: Self-pay

## 2020-02-04 ENCOUNTER — Other Ambulatory Visit (INDEPENDENT_AMBULATORY_CARE_PROVIDER_SITE_OTHER): Payer: BC Managed Care – PPO

## 2020-02-04 DIAGNOSIS — E039 Hypothyroidism, unspecified: Secondary | ICD-10-CM

## 2020-02-04 DIAGNOSIS — Z853 Personal history of malignant neoplasm of breast: Secondary | ICD-10-CM | POA: Diagnosis not present

## 2020-02-04 LAB — HEPATIC FUNCTION PANEL
ALT: 50 U/L — ABNORMAL HIGH (ref 0–35)
AST: 23 U/L (ref 0–37)
Albumin: 4.6 g/dL (ref 3.5–5.2)
Alkaline Phosphatase: 114 U/L (ref 39–117)
Bilirubin, Direct: 0.2 mg/dL (ref 0.0–0.3)
Total Bilirubin: 1 mg/dL (ref 0.2–1.2)
Total Protein: 6.9 g/dL (ref 6.0–8.3)

## 2020-02-04 LAB — TSH: TSH: 0.09 u[IU]/mL — ABNORMAL LOW (ref 0.35–4.50)

## 2020-02-05 ENCOUNTER — Other Ambulatory Visit: Payer: BC Managed Care – PPO

## 2020-02-06 ENCOUNTER — Other Ambulatory Visit: Payer: Self-pay | Admitting: Family Medicine

## 2020-02-06 DIAGNOSIS — F419 Anxiety disorder, unspecified: Secondary | ICD-10-CM

## 2020-02-06 DIAGNOSIS — F32A Depression, unspecified: Secondary | ICD-10-CM

## 2020-02-06 DIAGNOSIS — F411 Generalized anxiety disorder: Secondary | ICD-10-CM

## 2020-02-19 ENCOUNTER — Other Ambulatory Visit: Payer: Self-pay | Admitting: Family Medicine

## 2020-02-19 DIAGNOSIS — E890 Postprocedural hypothyroidism: Secondary | ICD-10-CM

## 2020-02-19 MED ORDER — LEVOTHYROXINE SODIUM 137 MCG PO TABS: 137.0000 ug | ORAL_TABLET | Freq: Every day | ORAL | 1 refills | Status: DC

## 2020-02-20 ENCOUNTER — Telehealth: Payer: Self-pay

## 2020-02-20 NOTE — Telephone Encounter (Signed)
Christie Ramirez has been called and she has received the lab results.

## 2020-02-20 NOTE — Telephone Encounter (Signed)
Patient was returning call for lab results 

## 2020-02-20 NOTE — Telephone Encounter (Signed)
Left message to call back for lab results.

## 2020-02-25 ENCOUNTER — Other Ambulatory Visit: Payer: BC Managed Care – PPO

## 2020-03-04 ENCOUNTER — Other Ambulatory Visit: Payer: Self-pay | Admitting: Family Medicine

## 2020-03-04 DIAGNOSIS — G4700 Insomnia, unspecified: Secondary | ICD-10-CM

## 2020-03-15 ENCOUNTER — Ambulatory Visit: Payer: BC Managed Care – PPO | Admitting: Dermatology

## 2020-03-15 ENCOUNTER — Other Ambulatory Visit: Payer: Self-pay

## 2020-03-15 DIAGNOSIS — D229 Melanocytic nevi, unspecified: Secondary | ICD-10-CM

## 2020-03-15 DIAGNOSIS — L578 Other skin changes due to chronic exposure to nonionizing radiation: Secondary | ICD-10-CM

## 2020-03-15 DIAGNOSIS — Z1283 Encounter for screening for malignant neoplasm of skin: Secondary | ICD-10-CM | POA: Diagnosis not present

## 2020-03-15 DIAGNOSIS — L814 Other melanin hyperpigmentation: Secondary | ICD-10-CM | POA: Diagnosis not present

## 2020-03-15 DIAGNOSIS — L821 Other seborrheic keratosis: Secondary | ICD-10-CM | POA: Diagnosis not present

## 2020-03-15 DIAGNOSIS — Z853 Personal history of malignant neoplasm of breast: Secondary | ICD-10-CM | POA: Diagnosis not present

## 2020-03-15 DIAGNOSIS — I8310 Varicose veins of unspecified lower extremity with inflammation: Secondary | ICD-10-CM

## 2020-03-15 DIAGNOSIS — D1800 Hemangioma unspecified site: Secondary | ICD-10-CM

## 2020-03-15 NOTE — Patient Instructions (Signed)

## 2020-03-15 NOTE — Progress Notes (Signed)
   Follow-Up Visit   Subjective  Christie Ramirez is a 65 y.o. female who presents for the following: Annual Exam. The patient presents for Total-Body Skin Exam (TBSE) for skin cancer screening and mole check.  The following portions of the chart were reviewed this encounter and updated as appropriate:   Tobacco  Allergies  Meds  Problems  Med Hx  Surg Hx  Fam Hx     Review of Systems:  No other skin or systemic complaints except as noted in HPI or Assessment and Plan.  Objective  Well appearing patient in no apparent distress; mood and affect are within normal limits.  A full examination was performed including scalp, head, eyes, ears, nose, lips, neck, chest, axillae, abdomen, back, buttocks, bilateral upper extremities, bilateral lower extremities, hands, feet, fingers, toes, fingernails, and toenails. All findings within normal limits unless otherwise noted below.  Objective  Right Breast: Well healed, no lymphadenopathy.    Assessment & Plan  Hx of breast cancer Right Breast  Observe No lymphadenopathy.   Skin cancer screening   Lentigines - Scattered tan macules - Discussed due to sun exposure - Benign, observe - Call for any changes  Seborrheic Keratoses - Stuck-on, waxy, tan-brown papules and plaques  - Discussed benign etiology and prognosis. - Observe - Call for any changes  Melanocytic Nevi - Tan-brown and/or pink-flesh-colored symmetric macules and papules - Benign appearing on exam today - Observation - Call clinic for new or changing moles - Recommend daily use of broad spectrum spf 30+ sunscreen to sun-exposed areas.   Hemangiomas - Red papules - Discussed benign nature - Observe - Call for any changes  Actinic Damage - Chronic, secondary to cumulative UV/sun exposure - diffuse scaly erythematous macules with underlying dyspigmentation - Recommend daily broad spectrum sunscreen SPF 30+ to sun-exposed areas, reapply every 2 hours as  needed.  - Call for new or changing lesions.  Varicose Veins - Dilated blue, purple or red veins at the lower extremities - Reassured - These can be treated by sclerotherapy (a procedure to inject a medicine into the veins to make them disappear) if desired, but the treatment is not covered by insurance  Skin cancer screening performed today.  Return in about 1 year (around 03/15/2021) for TBSE .  IMarye Round, CMA, am acting as scribe for Sarina Ser, MD .  Documentation: I have reviewed the above documentation for accuracy and completeness, and I agree with the above.  Sarina Ser, MD

## 2020-03-16 ENCOUNTER — Encounter: Payer: Self-pay | Admitting: Dermatology

## 2020-04-05 ENCOUNTER — Other Ambulatory Visit (INDEPENDENT_AMBULATORY_CARE_PROVIDER_SITE_OTHER): Payer: BC Managed Care – PPO

## 2020-04-05 ENCOUNTER — Other Ambulatory Visit: Payer: Self-pay

## 2020-04-05 DIAGNOSIS — E890 Postprocedural hypothyroidism: Secondary | ICD-10-CM | POA: Diagnosis not present

## 2020-04-05 LAB — TSH: TSH: 0.43 u[IU]/mL (ref 0.35–4.50)

## 2020-04-25 ENCOUNTER — Other Ambulatory Visit: Payer: Self-pay | Admitting: Family Medicine

## 2020-04-25 DIAGNOSIS — E890 Postprocedural hypothyroidism: Secondary | ICD-10-CM

## 2020-05-06 ENCOUNTER — Encounter: Payer: Self-pay | Admitting: Oncology

## 2020-05-06 ENCOUNTER — Inpatient Hospital Stay: Payer: BC Managed Care – PPO | Attending: Oncology

## 2020-05-06 ENCOUNTER — Other Ambulatory Visit: Payer: Self-pay

## 2020-05-06 ENCOUNTER — Inpatient Hospital Stay (HOSPITAL_BASED_OUTPATIENT_CLINIC_OR_DEPARTMENT_OTHER): Payer: BC Managed Care – PPO | Admitting: Oncology

## 2020-05-06 ENCOUNTER — Telehealth: Payer: Self-pay | Admitting: *Deleted

## 2020-05-06 VITALS — BP 121/90 | HR 79 | Temp 97.0°F | Resp 18 | Wt 159.3 lb

## 2020-05-06 DIAGNOSIS — R7401 Elevation of levels of liver transaminase levels: Secondary | ICD-10-CM | POA: Insufficient documentation

## 2020-05-06 DIAGNOSIS — Z923 Personal history of irradiation: Secondary | ICD-10-CM | POA: Insufficient documentation

## 2020-05-06 DIAGNOSIS — K219 Gastro-esophageal reflux disease without esophagitis: Secondary | ICD-10-CM | POA: Insufficient documentation

## 2020-05-06 DIAGNOSIS — E785 Hyperlipidemia, unspecified: Secondary | ICD-10-CM | POA: Insufficient documentation

## 2020-05-06 DIAGNOSIS — Z7982 Long term (current) use of aspirin: Secondary | ICD-10-CM | POA: Insufficient documentation

## 2020-05-06 DIAGNOSIS — Z853 Personal history of malignant neoplasm of breast: Secondary | ICD-10-CM | POA: Diagnosis not present

## 2020-05-06 DIAGNOSIS — I10 Essential (primary) hypertension: Secondary | ICD-10-CM | POA: Insufficient documentation

## 2020-05-06 DIAGNOSIS — M8580 Other specified disorders of bone density and structure, unspecified site: Secondary | ICD-10-CM | POA: Insufficient documentation

## 2020-05-06 DIAGNOSIS — Z7952 Long term (current) use of systemic steroids: Secondary | ICD-10-CM | POA: Diagnosis not present

## 2020-05-06 DIAGNOSIS — E039 Hypothyroidism, unspecified: Secondary | ICD-10-CM | POA: Insufficient documentation

## 2020-05-06 DIAGNOSIS — F418 Other specified anxiety disorders: Secondary | ICD-10-CM | POA: Diagnosis not present

## 2020-05-06 DIAGNOSIS — Z170 Estrogen receptor positive status [ER+]: Secondary | ICD-10-CM | POA: Insufficient documentation

## 2020-05-06 DIAGNOSIS — Z9223 Personal history of estrogen therapy: Secondary | ICD-10-CM | POA: Insufficient documentation

## 2020-05-06 DIAGNOSIS — Z79899 Other long term (current) drug therapy: Secondary | ICD-10-CM | POA: Diagnosis not present

## 2020-05-06 DIAGNOSIS — G4730 Sleep apnea, unspecified: Secondary | ICD-10-CM | POA: Diagnosis not present

## 2020-05-06 LAB — CBC WITH DIFFERENTIAL/PLATELET
Abs Immature Granulocytes: 0.02 10*3/uL (ref 0.00–0.07)
Basophils Absolute: 0.1 10*3/uL (ref 0.0–0.1)
Basophils Relative: 1 %
Eosinophils Absolute: 0.2 10*3/uL (ref 0.0–0.5)
Eosinophils Relative: 2 %
HCT: 38.5 % (ref 36.0–46.0)
Hemoglobin: 12.9 g/dL (ref 12.0–15.0)
Immature Granulocytes: 0 %
Lymphocytes Relative: 36 %
Lymphs Abs: 3.4 10*3/uL (ref 0.7–4.0)
MCH: 28.6 pg (ref 26.0–34.0)
MCHC: 33.5 g/dL (ref 30.0–36.0)
MCV: 85.4 fL (ref 80.0–100.0)
Monocytes Absolute: 0.7 10*3/uL (ref 0.1–1.0)
Monocytes Relative: 7 %
Neutro Abs: 5.1 10*3/uL (ref 1.7–7.7)
Neutrophils Relative %: 54 %
Platelets: 319 10*3/uL (ref 150–400)
RBC: 4.51 MIL/uL (ref 3.87–5.11)
RDW: 13.6 % (ref 11.5–15.5)
WBC: 9.4 10*3/uL (ref 4.0–10.5)
nRBC: 0 % (ref 0.0–0.2)

## 2020-05-06 LAB — COMPREHENSIVE METABOLIC PANEL
ALT: 72 U/L — ABNORMAL HIGH (ref 0–44)
AST: 33 U/L (ref 15–41)
Albumin: 4.6 g/dL (ref 3.5–5.0)
Alkaline Phosphatase: 98 U/L (ref 38–126)
Anion gap: 10 (ref 5–15)
BUN: 19 mg/dL (ref 8–23)
CO2: 27 mmol/L (ref 22–32)
Calcium: 9.6 mg/dL (ref 8.9–10.3)
Chloride: 103 mmol/L (ref 98–111)
Creatinine, Ser: 0.66 mg/dL (ref 0.44–1.00)
GFR, Estimated: >60 mL/min (ref >60)
Glucose, Bld: 110 mg/dL — ABNORMAL HIGH (ref 70–99)
Potassium: 4.5 mmol/L (ref 3.5–5.1)
Sodium: 140 mmol/L (ref 135–145)
Total Bilirubin: 1.1 mg/dL (ref 0.3–1.2)
Total Protein: 7.4 g/dL (ref 6.5–8.1)

## 2020-05-06 NOTE — Telephone Encounter (Signed)
mammo in 12/2020-no new issues  see MD after mammogram with lab per 05/06/20 los.  FYI.   Per  Earlean Polka from [place] stated that Mammograms can not be scheduled until 42mths prior to pts last mammo. Which was on 01/22/20 Pts mammogram can't be scheduled until June of 2022. Pt was made aware and said that she would call in June and get it scheduled. I went ahead and got her scheduled to RTC for lab/MD per 05/06/20 los after her 12/22 mammo, she's aware that her RTC date maybe changed also.

## 2020-05-06 NOTE — Progress Notes (Signed)
Hematology/Oncology follow up note [facility] [phone] [phone]   Patient Care Team: Leone Haven, MD as PCP - General (Family Medicine) Bary Castilla, Forest Gleason, MD (General Surgery) Jackolyn Confer, MD (Internal Medicine) Leone Haven, MD (Family Medicine) Earlie Server, MD as Consulting Physician (Hematology and Oncology)  REFERRING PROVIDER: Leone Haven, MD  CHIEF COMPLAINTS/REASON FOR VISIT:  Follow up for  history of breast cancer.  HISTORY OF PRESENTING ILLNESS:   Christie Ramirez is a  65 y.o.  female with PMH listed below was seen in consultation at the request of  Leone Haven, MD  for evaluation of history of breast cancer. Patient previously was seen by Dr. Jeb Levering for 1 time.  She wants to reestablish care. Patient was recently seen by Dr. Marlou Starks as she felt a small mass in the upper inner left breast.  The size starts to grow and gets bigger. Extensive medical records reviewed was performed by me. Patient has a history of pT1a pN0 right breast cancer, diagnosed in April 2015, ER/PR positive, HER-2 negative, grade 1, DCIS present, margins negative.  Status post lumpectomy,adjuvant radiation.  Patient was recommended by Dr. Oliva Bustard to start Letrozole. She tried for very short period of time and self stopped.  Per patient she was discharged from cancer center as she was not taking her medication. Patient follows up with her OB/GYN Dr. Enzo Bi, [facility] physician and was prescribed on Raloxifen 68m daily. Per records, she was started on Raloxifen in Feb 2018,   Tolerates well.  Denies any personal history of blood clots. She has history of hysterectomy. Her OB/GYN doctor recently retired.  She wants to establish care with oncology for further management of stage I Patient has anxiety and depression.  She comes to the clinic with her service dog.   INTERVAL HISTORY Christie Ramirez a 65y.o. female who has  above history reviewed by me today presents for follow up visit for breast cancer Problems and complaints are listed below: Patient reports doing well.  She had Covid pneumonia at the end of last year.  Symptoms follow recovered.  Currently not on raloxifene or tamoxifen.  No new complaints.   Review of Systems  Constitutional: Negative for appetite change, chills, fatigue and fever.  HENT:   Negative for hearing loss and voice change.   Eyes: Negative for eye problems.  Respiratory: Negative for chest tightness and cough.   Cardiovascular: Negative for chest pain.  Gastrointestinal: Negative for abdominal distention, abdominal pain and blood in stool.  Endocrine: Negative for hot flashes.  Genitourinary: Negative for difficulty urinating and frequency.   Musculoskeletal: Negative for arthralgias.  Skin: Negative for itching and rash.  Neurological: Negative for extremity weakness.  Hematological: Negative for adenopathy.  Psychiatric/Behavioral: Negative for confusion.    MEDICAL HISTORY:  Past Medical History:  Diagnosis Date  . Anxiety   [place]   . 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   . [place]   thyroid  . Endometriosis 2009  . Fibula fracture 11/11/2015  . GERD (gastroesophageal reflux disease)   . Hyperlipidemia   . Hypertension   . Hypothyroidism   . Osteopenia    rt hip  . Personal history of radiation therapy 2015   mammosite  . Seasonal allergies   . Sleep apnea    does not wear CPAP  SURGICAL HISTORY: Past Surgical History:  Procedure Laterality Date  . ABDOMINAL HYSTERECTOMY  2005   Martin DeFrancesco,MD  . BREAST BIOPSY Right 04-02-13   INVASIVE MAMMARY CARCINOMA WITH FEATURES OF TUBULAR CARCINOMA,  . BREAST CYST EXCISION Left 12/02/2018   Procedure: EXCISION LEFT BREAST SEBACEOUS CYST;  Surgeon: Jovita Kussmaul,  MD;  Location: [place];  Service: General;  Laterality: Left;  . BREAST LUMPECTOMY Right 04/2013   INVASIVE MAMMARY CARCINOMA WITH FEATURES OF TUBULAR CARCINOMA,  . BREAST SURGERY Right 05/01/13   wide excision  . colectomy  2009   secondary to endometriosis DR. Smith  . 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 No malignancy.  . COLONOSCOPY  2009   Dr. Tamala Julian  . COLONOSCOPY WITH PROPOFOL N/A 11/19/2018   Procedure: COLONOSCOPY WITH PROPOFOL;  Surgeon: Lucilla Lame, MD;  [facility] ENDOSCOPY;  Service: Endoscopy;  Laterality: N/A;  . FOOT SURGERY  right  . MASTOPEXY Bilateral 01/03/2019   Procedure: MASTOPEXY;  Surgeon: Irene Limbo, MD;  Location: [place];  Service: Plastics;  Laterality: Bilateral;    SOCIAL HISTORY: Social History   Socioeconomic History  . Marital status: Married    Spouse name: Not on file  . Number of children: Not on file  . Years of education: Not on file  . Highest education level: Not on file  Occupational History  . Not on file  Tobacco Use  . Smoking status: Never Smoker  . Smokeless tobacco: Never Used  Vaping Use  . Vaping Use: Never used  Substance and Sexual Activity  . Alcohol use: Not Currently    Alcohol/week: 0.0 standard drinks    Comment: wine socially  . Drug use: No  . Sexual activity: Yes    Birth control/protection: Surgical  Other Topics Concern  . 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 Colon polyps Father        age 65  . Heart disease Father   . Bone cancer Father   . Heart disease Mother   . Rheumatic fever Mother   . Heart disease Paternal Grandmother   . Heart disease Paternal Grandfather   . Cancer Neg Hx   . Diabetes Neg Hx   . Breast cancer Neg Hx     ALLERGIES:  is allergic to codeine, codeine, iodinated diagnostic agents, iodine, iodine, penicillins, and penicillins.  MEDICATIONS:  Current Outpatient Medications  Medication Sig Dispense Refill  . albuterol (VENTOLIN HFA) 108 (90 Base) MCG/ACT inhaler Inhale 2 puffs into the lungs every 6 (six) hours as needed for wheezing or shortness of breath. 1 each 1  . amLODipine (NORVASC) 5 MG tablet Take 2 tablets (10 mg total) by mouth daily. 180 tablet 2  . amoxicillin-clavulanate (AUGMENTIN)  875-125 MG tablet Take 1 tablet by mouth every 12 (twelve) hours. 14 tablet 0  . aspirin 81 MG tablet Take 81 mg by mouth daily.    Marland Kitchen atorvastatin (LIPITOR) 40 MG tablet TAKE ONE TABLET BY MOUTH DAILY 90 tablet 0  . atorvastatin (LIPITOR) 80 MG tablet Take by mouth.    Marland Kitchen CALCIUM-MAGNESIUM-ZINC PO Take by mouth 3 (three) times daily.    . cetirizine (ZYRTEC) 10 MG tablet SMARTSIG:1 Pill By Mouth Daily PRN    . Cholecalciferol (VITAMIN D-3 PO) Take 1,000 Units by mouth.    . escitalopram (LEXAPRO) 10 MG tablet TAKE ONE TABLET BY MOUTH DAILY 90 tablet 0  . ezetimibe (ZETIA) 10 MG tablet Take 1 tablet (10 mg total) by mouth daily. 90 tablet 1  . fluticasone (FLONASE) 50 MCG/ACT nasal spray Place 2 sprays into both nostrils daily.     Marland Kitchen levothyroxine (SYNTHROID) 137 MCG tablet Take 1 tablet (137 mcg total) by mouth daily before breakfast. 90 tablet 1  . levothyroxine (SYNTHROID) 150 MCG tablet TAKE ONE TABLET BY MOUTH DAILY BEFORE BREAKFAST 90 tablet 0  . losartan (COZAAR)  100 MG tablet Take 1 tablet (100 mg total) by mouth daily. 90 tablet 1  . Multiple Vitamins-Minerals (MULTIVITAMIN WITH MINERALS) tablet Take 1 tablet by mouth daily.    . traZODone (DESYREL) 50 MG tablet TAKE TWO TABLETS BY MOUTH EVERY NIGHT AT BEDTIME AS NEEDED FOR SLEEP (Patient taking differently: Pt reports she is taking three tablets per night.) 180 tablet 1  . vitamin C (ASCORBIC ACID) 500 MG tablet Take 500 mg by mouth daily.    Marland Kitchen GARLIC PO Take 600 mg by mouth. (Patient not taking: Reported on 05/06/2020)    . ondansetron (ZOFRAN) 4 MG tablet Take 1 tablet (4 mg total) by mouth every 8 (eight) hours as needed for up to 10 doses for nausea or vomiting. (Patient not taking: Reported on 05/06/2020) 10 tablet 0  . predniSONE (DELTASONE) 10 MG tablet Take 6 tablets ( 60 mg total) on day 1 and take 5 tablets (50 mg total) on day 2, and decrease by 1 tablet (10 mg) every day until off in 6 days. (Patient not taking: Reported on 05/06/2020) 21 tablet 0   No current facility-administered medications for this visit.     PHYSICAL EXAMINATION: ECOG PERFORMANCE STATUS: 0 - Asymptomatic Vitals:   05/06/20 0945  BP: 121/90  Pulse: 79  Resp: 18  Temp: (!) 97 F (36.1 C)  SpO2: 97%   Filed Weights   05/06/20 0945  Weight: 159 lb 4.8 oz (72.3 kg)    Physical Exam Constitutional:      General: She is not in acute distress. HENT:     Head: Normocephalic and atraumatic.  Eyes:     General: No scleral icterus.    Pupils: Pupils are equal, round, and reactive to light.  Cardiovascular:     Rate and Rhythm: Normal rate and regular rhythm.     Heart sounds: Normal heart sounds.  Pulmonary:     Effort: Pulmonary effort is normal. No respiratory distress.     Breath sounds: No wheezing.  Abdominal:     General: Bowel sounds are normal. There is no distension.     Palpations: Abdomen is soft. There is no mass.     Tenderness: There is no abdominal tenderness.  Musculoskeletal:         General: No deformity. Normal range of motion.     Cervical back: Normal  range of motion and neck supple.  Skin:    General: Skin is warm and dry.     Findings: No erythema or rash.  Neurological:     Mental Status: She is alert and oriented to person, place, and time.     Cranial Nerves: No cranial nerve deficit.     Coordination: Coordination normal.  Psychiatric:        Behavior: Behavior normal.        Thought Content: Thought content normal.   Patient prefers to defer breast examination.  No breast complaints.   LABORATORY DATA:  I have reviewed the data as listed Lab Results  Component Value Date   WBC 9.4 05/06/2020   HGB 12.9 05/06/2020   HCT 38.5 05/06/2020   MCV 85.4 05/06/2020   PLT 319 05/06/2020   Recent Labs    11/06/19 1005 12/04/19 1439 12/31/19 0933 01/05/20 1112 02/04/20 0840 05/06/20 0937  NA 136 132* 140  --   --  140  K 4.4 3.6 4.1  --   --  4.5  CL 102 95* 103  --   --  103  CO2 26 27 30   --   --  27  GLUCOSE 102* 109* 114*  --   --  110*  BUN 20 11 14   --   --  19  CREATININE 0.70 0.78 0.63  --   --  0.66  CALCIUM 8.9 8.8* 9.7  --   --  9.6  GFRNONAA >60 >60  --   --   --  >60  PROT 7.1 7.1 6.9 7.8 6.9 7.4  ALBUMIN 4.3 4.1 4.5 4.6 4.6 4.6  AST 37 26 23 27 23  33  ALT 66* 46* 48* 48* 50* 72*  ALKPHOS 104 106 129* 107 114 98  BILITOT 1.0 0.6 0.7 0.9 1.0 1.1  BILIDIR  --   --   --  0.1 0.2  --   IBILI  --   --   --  0.8  --   --    Iron/TIBC/Ferritin/ %Sat No results found for: IRON, TIBC, FERRITIN, IRONPCTSAT    RADIOGRAPHIC STUDIES: I have personally reviewed the radiological images as listed and agreed with the findings in the report.  No results found.    ASSESSMENT & PLAN:  1. History of breast cancer   2. Osteopenia, unspecified location   3. Elevated ALT measurement    #History of stage I pT1a pN0 right breast cancer, diagnosed in April 2015, ER/PR positive, HER-2 negative, Not able to tolerate aromatase inhibitor.   Currently not on any antiestrogen treatments. It has been 7 years since initial diagnosis.  She is doing very well clinically. Recommend annual screening mammogram, due in December 2022.  Elevated ALT, this is a chronic issue for her.  Advised patient to further discuss with primary care provider.  She is on cholesterol medication. No ultrasound was done which showed hemangioma.   Osteopenia.  Obtain bone density.  Recommend calcium and vitamin D supplementation. Orders Placed This Encounter  Procedures  . DG Bone Density    Standing Status:   Future    Standing Expiration Date:   05/06/2021    Order Specific Question:   Reason for Exam (SYMPTOM  OR DIAGNOSIS REQUIRED)    Answer:   hx breast cancer with aromatase inhibitor use    Order Specific Question:   Preferred imaging location?    Answer:   [place]  Standing Status:   Future    Standing Expiration Date:   05/06/2021    Order Specific Question:   Reason for Exam (SYMPTOM  OR DIAGNOSIS REQUIRED)    Answer:   screening with hx breast cancer    Order Specific Question:   Preferred imaging location?    Answer:   [facility]  . CBC with Differential/Platelet    Standing Status:   Future    Standing Expiration Date:   05/06/2021  . Comprehensive metabolic panel    Standing Status:   Future    Standing Expiration Date:   05/06/2021    All questions were answered. The patient knows to call the clinic with any problems questions or concerns.  cc Leone Haven, MD    Return of visit: Follow-up after mammogram in December 2022. Thank you for this kind referral and the opportunity to participate in the care of this patient. A copy of today's note is routed to referring provider   Earlie Server, MD, PhD Hematology Oncology [facility] Pager- [phone] 05/06/2020

## 2020-05-13 ENCOUNTER — Other Ambulatory Visit: Payer: BC Managed Care – PPO

## 2020-05-24 ENCOUNTER — Other Ambulatory Visit: Payer: Self-pay | Admitting: Family Medicine

## 2020-05-24 DIAGNOSIS — I10 Essential (primary) hypertension: Secondary | ICD-10-CM

## 2020-05-28 ENCOUNTER — Encounter: Payer: Self-pay | Admitting: Podiatry

## 2020-05-28 ENCOUNTER — Ambulatory Visit: Payer: BC Managed Care – PPO | Admitting: Podiatry

## 2020-05-28 ENCOUNTER — Other Ambulatory Visit: Payer: Self-pay

## 2020-05-28 DIAGNOSIS — M7751 Other enthesopathy of right foot: Secondary | ICD-10-CM

## 2020-05-28 DIAGNOSIS — M205X1 Other deformities of toe(s) (acquired), right foot: Secondary | ICD-10-CM | POA: Diagnosis not present

## 2020-05-28 DIAGNOSIS — M722 Plantar fascial fibromatosis: Secondary | ICD-10-CM

## 2020-05-28 MED ORDER — GABAPENTIN 100 MG PO CAPS: 100.0000 mg | ORAL_CAPSULE | Freq: Three times a day (TID) | ORAL | 1 refills | Status: DC

## 2020-05-28 NOTE — Progress Notes (Signed)
HPI: 65 y.o. female presenting today for evaluation of bilateral foot pain. Patient states that she has recently been dealing with heel pain for several months now. No injury. Aggravated by certain shoes and going barefoot.   Patient states that her RT great toe is tolerable. Patient does have history of cheilectomy surgery to the right great toe joint several years ago.  She states that over time progressively she has had increased pain and tenderness with walking to the right great toe joint.    Patient also complains of RT 3rd MTPJ pain. She has had this pain before and she received injections which have helped. She presents for further treatment and evaluation  Past Medical History:  Diagnosis Date  . Anxiety   . Breast cancer ([place])   . Breast cancer of upper-inner quadrant of right female breast ([place]) 05/01/2013   Tubular carcinoma, T1b,N0,M0; ER/ PR 90%, her 2 neu not overexpressed.   . Cancer ([place]) 1995   thyroid  . Endometriosis 2009  . Fibula fracture 11/11/2015  . GERD (gastroesophageal reflux disease)   . Hyperlipidemia   . Hypertension   . Hypothyroidism   . Osteopenia    rt hip  . Personal history of radiation therapy 2015   mammosite  . Seasonal allergies   . Sleep apnea    does not wear CPAP     Physical Exam: General: The patient is alert and oriented x3 in no acute distress.  Dermatology: Skin is warm, dry and supple bilateral lower extremities. Negative for open lesions or macerations.  Vascular: Palpable pedal pulses bilaterally. No edema or erythema noted. Capillary refill within normal limits.  Neurological: Epicritic and protective threshold grossly intact bilaterally.   Musculoskeletal Exam: Range of motion within normal limits to all pedal and ankle joints bilateral. Muscle strength 5/5 in all groups bilateral.  Pain on palpation range of motion the second MTPJ of the right foot.  There is also pain on palpation range of motion the first MTPJ of the  right foot.  Clinical evidence of hypertrophic spurring noted to the first MTPJ.  Limited range of motion to the first MTPJ consistent with findings of hallux limitus.  Pain on palpation to the bilateral heels at the insertion of the plantar fascia consistent with plantar fasciitis  Assessment: 1.  Chronic symptomatic hallux limitus right 2.  Second MTPJ capsulitis right 3.  Plantar fasciitis bilateral   Plan of Care:  1. Patient evaluated.  2.  Injection of 0.5 cc Celestone Soluspan injected in the third MTPJ of the right foot as well as the plantar fascia bilateral 3.  Patient has rescheduled surgery to December of this year to address her hallux limitus to the right foot. 4.  Continue Motrin 600 mg daily which she states helps alleviate her pain 5.  Return to clinic 2 months prior to surgery or if her plantar fascial pain recurs.  Patient will need follow-up x-rays prior to surgery  *Culinary analyst/catering coordinator at [facility], DPM [facility]  Dr. Edrick Kins, DPM    [address], [place] [address]  Office [phone]  Fax [phone]

## 2020-06-08 ENCOUNTER — Telehealth: Payer: Self-pay | Admitting: Urology

## 2020-06-08 NOTE — Telephone Encounter (Signed)
DOS - 07/01/20  KELLER BUNION IMPLANT RIGHT --- [id number]  BCBS EFFECTIVE DATE - 07/07/19   PLAN DEDUCTIBLE - $500.00 W/ $253.00 to go OUT OF POCKET - $2,500.00 W/ $1,990.00 to go COINSURANCE - 0% COPAY - $0.00   SPOKE WITH BRANDON C WITH [employer] AND HE STATED FOR CPT CODE 54656 NO PRIOR AUTH IS REQUIRED.  REF # [id number]

## 2020-06-14 ENCOUNTER — Ambulatory Visit
Admission: RE | Admit: 2020-06-14 | Discharge: 2020-06-14 | Disposition: A | Discharge status: Home / Self Care | Payer: BC Managed Care – PPO | Source: Ambulatory Visit | Attending: Oncology | Admitting: Oncology

## 2020-06-14 ENCOUNTER — Other Ambulatory Visit: Payer: Self-pay

## 2020-06-14 ENCOUNTER — Other Ambulatory Visit: Payer: Self-pay | Admitting: Family Medicine

## 2020-06-14 DIAGNOSIS — Z780 Asymptomatic menopausal state: Secondary | ICD-10-CM | POA: Diagnosis not present

## 2020-06-14 DIAGNOSIS — M8580 Other specified disorders of bone density and structure, unspecified site: Secondary | ICD-10-CM | POA: Diagnosis not present

## 2020-06-14 DIAGNOSIS — Z853 Personal history of malignant neoplasm of breast: Secondary | ICD-10-CM | POA: Diagnosis not present

## 2020-06-14 DIAGNOSIS — M85851 Other specified disorders of bone density and structure, right thigh: Secondary | ICD-10-CM | POA: Diagnosis not present

## 2020-06-14 DIAGNOSIS — E785 Hyperlipidemia, unspecified: Secondary | ICD-10-CM

## 2020-07-05 ENCOUNTER — Telehealth: Payer: Self-pay | Admitting: Family Medicine

## 2020-07-05 NOTE — Telephone Encounter (Signed)
Patient called and scheduled an appointment, 07/07/2020 with Dr Caryl Bis. She says she has a place inside her throat that is sore to the touch. She is concerned for possible thyroid cancer. It does not hurt to swallow, no other symptoms, has not been tested for covid, no cold or sinus symptoms.

## 2020-07-06 ENCOUNTER — Encounter: Payer: BC Managed Care – PPO | Admitting: Podiatry

## 2020-07-07 ENCOUNTER — Encounter: Payer: Self-pay | Admitting: Family Medicine

## 2020-07-07 ENCOUNTER — Other Ambulatory Visit: Payer: Self-pay

## 2020-07-07 ENCOUNTER — Ambulatory Visit: Payer: BC Managed Care – PPO | Admitting: Family Medicine

## 2020-07-07 VITALS — BP 120/70 | HR 109 | Temp 99.4°F | Ht 61.0 in | Wt 158.6 lb

## 2020-07-07 DIAGNOSIS — E890 Postprocedural hypothyroidism: Secondary | ICD-10-CM | POA: Diagnosis not present

## 2020-07-07 DIAGNOSIS — E785 Hyperlipidemia, unspecified: Secondary | ICD-10-CM

## 2020-07-07 DIAGNOSIS — R7989 Other specified abnormal findings of blood chemistry: Secondary | ICD-10-CM

## 2020-07-07 DIAGNOSIS — M542 Cervicalgia: Secondary | ICD-10-CM | POA: Diagnosis not present

## 2020-07-07 LAB — TSH: TSH: 0.08 u[IU]/mL — ABNORMAL LOW (ref 0.35–4.50)

## 2020-07-07 NOTE — Telephone Encounter (Signed)
Noted.  She should be fine to be seen in the office.

## 2020-07-07 NOTE — Assessment & Plan Note (Signed)
She will hold her Lipitor given elevated LFTs.  She can continue Zetia 10 mg once daily.  Plan to recheck liver enzymes in 2 weeks to determine if Lipitor is contributing to her LFT elevation.

## 2020-07-07 NOTE — Assessment & Plan Note (Signed)
Likely related to her Lipitor.  She will hold this and we will recheck in 2 weeks.  Discussed the potential for further work-up if her liver enzymes do not improve.

## 2020-07-07 NOTE — Patient Instructions (Addendum)
Nice to see you. Somebody should contact you to schedule the ultrasound. We will let you know what your labs show. Please stop the Lipitor.  We will recheck lab work in 2 weeks to see if your liver enzymes improved.

## 2020-07-07 NOTE — Progress Notes (Signed)
[name], MD Phone: [phone]  Christie Ramirez is a 65 y.o. female who presents today for f/u.  Anterior neck pain: Patient reports left anterior neck tenderness and occasional discomfort.  She is worried that this could represent thyroid cancer.  She has a history of thyroid cancer status post thyroidectomy 1996.  She underwent radioactive iodine.  She reports there was no lymphatic spread at that time.  She was released from the care of her specialist many years ago.  Heart rate slightly elevated today though patient notes she has been outside passing out boxed lunches in the heat.  She notes no palpitations.  Elevated LFTs: These have been trending up slightly.  She had an ultrasound by oncology that did not reveal a cause.  She is on a statin.  Social History   Tobacco Use  Smoking Status Never  Smokeless Tobacco Never    Current Outpatient Medications on File Prior to Visit  Medication Sig Dispense Refill   albuterol (VENTOLIN HFA) 108 (90 Base) MCG/ACT inhaler Inhale 2 puffs into the lungs every 6 (six) hours as needed for wheezing or shortness of breath. 1 each 1   amLODipine (NORVASC) 5 MG tablet Take 2 tablets (10 mg total) by mouth daily. 180 tablet 2   aspirin 81 MG tablet Take 81 mg by mouth daily.     atorvastatin (LIPITOR) 40 MG tablet TAKE ONE TABLET BY MOUTH DAILY 90 tablet 0   atorvastatin (LIPITOR) 80 MG tablet Take by mouth.     CALCIUM-MAGNESIUM-ZINC PO Take by mouth 3 (three) times daily.     cetirizine (ZYRTEC) 10 MG tablet SMARTSIG:1 Pill By Mouth Daily PRN     Cholecalciferol (VITAMIN D-3 PO) Take 1,000 Units by mouth.     escitalopram (LEXAPRO) 10 MG tablet TAKE ONE TABLET BY MOUTH DAILY 90 tablet 0   ezetimibe (ZETIA) 10 MG tablet TAKE ONE TABLET BY MOUTH DAILY 90 tablet 1   fluticasone (FLONASE) 50 MCG/ACT nasal spray Place 2 sprays into both nostrils daily.      gabapentin (NEURONTIN) 100 MG capsule Take 1 capsule (100 mg total) by mouth 3 (three)  times daily. 90 capsule 1   GARLIC PO Take 742 mg by mouth. (Patient not taking: Reported on 05/06/2020)     levothyroxine (SYNTHROID) 137 MCG tablet Take 1 tablet (137 mcg total) by mouth daily before breakfast. 90 tablet 1   levothyroxine (SYNTHROID) 150 MCG tablet TAKE ONE TABLET BY MOUTH DAILY BEFORE BREAKFAST 90 tablet 0   losartan (COZAAR) 100 MG tablet TAKE ONE TABLET BY MOUTH DAILY 90 tablet 1   Multiple Vitamins-Minerals (MULTIVITAMIN WITH MINERALS) tablet Take 1 tablet by mouth daily.     ondansetron (ZOFRAN) 4 MG tablet Take 1 tablet (4 mg total) by mouth every 8 (eight) hours as needed for up to 10 doses for nausea or vomiting. (Patient not taking: Reported on 05/06/2020) 10 tablet 0   predniSONE (DELTASONE) 10 MG tablet Take 6 tablets ( 60 mg total) on day 1 and take 5 tablets (50 mg total) on day 2, and decrease by 1 tablet (10 mg) every day until off in 6 days. (Patient not taking: Reported on 05/06/2020) 21 tablet 0   traZODone (DESYREL) 50 MG tablet TAKE TWO TABLETS BY MOUTH EVERY NIGHT AT BEDTIME AS NEEDED FOR SLEEP (Patient taking differently: Pt reports she is taking three tablets per night.) 180 tablet 1   vitamin C (ASCORBIC ACID) 500 MG tablet Take 500 mg by mouth daily.     [  DISCONTINUED] clonazePAM (KLONOPIN) 1 MG tablet Take 1 tablet (1 mg total) by mouth daily as needed for anxiety. (Patient not taking: No sig reported) 30 tablet 2   No current facility-administered medications on file prior to visit.     ROS see history of present illness  Objective  Physical Exam Vitals:   07/07/20 1359  BP: 120/70  Pulse: (!) 109  Temp: 99.4 F (37.4 C)  SpO2: 97%    BP Readings from Last 3 Encounters:  07/07/20 120/70  05/06/20 121/90  01/09/20 (!) 156/106   Wt Readings from Last 3 Encounters:  07/07/20 158 lb 9.6 oz (71.9 kg)  05/06/20 159 lb 4.8 oz (72.3 kg)  01/09/20 156 lb (70.8 kg)    Physical Exam Constitutional:      General: She is not in acute  distress.    Appearance: She is not diaphoretic.  Neck:     Thyroid: No thyroid mass.      Comments: Slight tenderness in the area outlined above, no palpable abnormalities noted Cardiovascular:     Rate and Rhythm: Regular rhythm. Tachycardia present.     Heart sounds: Normal heart sounds.  Pulmonary:     Effort: Pulmonary effort is normal.  Lymphadenopathy:     Cervical: No cervical adenopathy.  Skin:    General: Skin is warm and dry.  Neurological:     Mental Status: She is alert.     Assessment/Plan: Please see individual problem list.  Problem List Items Addressed This Visit     Hypothyroidism   Relevant Orders   US THYROID   TSH   Hyperlipidemia with target low density lipoprotein (LDL) cholesterol less than 100 mg/dL    She will hold her Lipitor given elevated LFTs.  She can continue Zetia 10 mg once daily.  Plan to recheck liver enzymes in 2 weeks to determine if Lipitor is contributing to her LFT elevation.       Elevated LFTs    Likely related to her Lipitor.  She will hold this and we will recheck in 2 weeks.  Discussed the potential for further work-up if her liver enzymes do not improve.       Tenderness of neck - Primary    Discussed obtaining an ultrasound to evaluate the area.  I advised that it would be fairly unlikely for her to have a recurrence of her thyroid cancer this far out from treatment and cure.  We will check a TSH as well given her mild tachycardia though I feel as though that is related to her having been outside the 90 degree weather.       Relevant Orders   US THYROID   Other Visit Diagnoses     Hyperlipidemia, unspecified hyperlipidemia type       Relevant Orders   Hepatic function panel   LDL cholesterol, direct       Return in about 2 weeks (around 07/21/2020) for labs, 6 weeks PCP for External throat pain.  This visit occurred during the SARS-CoV-2 public health emergency.  Safety protocols were in place, including  screening questions prior to the visit, additional usage of staff PPE, and extensive cleaning of exam room while observing appropriate contact time as indicated for disinfecting solutions.    [name], MD [place]

## 2020-07-07 NOTE — Assessment & Plan Note (Signed)
Discussed obtaining an ultrasound to evaluate the area.  I advised that it would be fairly unlikely for her to have a recurrence of her thyroid cancer this far out from treatment and cure.  We will check a TSH as well given her mild tachycardia though I feel as though that is related to her having been outside the 90 degree weather.

## 2020-07-08 ENCOUNTER — Telehealth: Payer: Self-pay | Admitting: Family Medicine

## 2020-07-08 NOTE — Telephone Encounter (Signed)
Lft vm for pt to call ofc to sch US thyroid.thanks

## 2020-07-09 ENCOUNTER — Encounter: Payer: BC Managed Care – PPO | Admitting: Podiatry

## 2020-07-09 NOTE — Telephone Encounter (Signed)
Patient returned referrals call.

## 2020-07-15 ENCOUNTER — Other Ambulatory Visit: Payer: Self-pay | Admitting: Family Medicine

## 2020-07-15 DIAGNOSIS — E890 Postprocedural hypothyroidism: Secondary | ICD-10-CM

## 2020-07-15 MED ORDER — LEVOTHYROXINE SODIUM 137 MCG PO TABS: 137.0000 ug | ORAL_TABLET | Freq: Every day | ORAL | 1 refills | Status: DC

## 2020-07-16 ENCOUNTER — Encounter: Payer: BC Managed Care – PPO | Admitting: Podiatry

## 2020-07-21 ENCOUNTER — Other Ambulatory Visit: Payer: Self-pay

## 2020-07-21 ENCOUNTER — Other Ambulatory Visit (INDEPENDENT_AMBULATORY_CARE_PROVIDER_SITE_OTHER): Payer: BC Managed Care – PPO

## 2020-07-21 DIAGNOSIS — E785 Hyperlipidemia, unspecified: Secondary | ICD-10-CM | POA: Diagnosis not present

## 2020-07-21 DIAGNOSIS — E890 Postprocedural hypothyroidism: Secondary | ICD-10-CM | POA: Diagnosis not present

## 2020-07-21 LAB — TSH: TSH: 0.15 u[IU]/mL — ABNORMAL LOW (ref 0.35–4.50)

## 2020-07-21 LAB — HEPATIC FUNCTION PANEL
ALT: 87 U/L — ABNORMAL HIGH (ref 0–35)
AST: 42 U/L — ABNORMAL HIGH (ref 0–37)
Albumin: 4.1 g/dL (ref 3.5–5.2)
Alkaline Phosphatase: 89 U/L (ref 39–117)
Bilirubin, Direct: 0.1 mg/dL (ref 0.0–0.3)
Total Bilirubin: 0.6 mg/dL (ref 0.2–1.2)
Total Protein: 6.3 g/dL (ref 6.0–8.3)

## 2020-07-21 LAB — LDL CHOLESTEROL, DIRECT: Direct LDL: 122 mg/dL

## 2020-07-25 ENCOUNTER — Other Ambulatory Visit: Payer: Self-pay

## 2020-07-25 DIAGNOSIS — Y990 Civilian activity done for income or pay: Secondary | ICD-10-CM | POA: Diagnosis not present

## 2020-07-25 DIAGNOSIS — I10 Essential (primary) hypertension: Secondary | ICD-10-CM | POA: Insufficient documentation

## 2020-07-25 DIAGNOSIS — Z90710 Acquired absence of both cervix and uterus: Secondary | ICD-10-CM | POA: Diagnosis not present

## 2020-07-25 DIAGNOSIS — Z853 Personal history of malignant neoplasm of breast: Secondary | ICD-10-CM | POA: Diagnosis not present

## 2020-07-25 DIAGNOSIS — D72829 Elevated white blood cell count, unspecified: Secondary | ICD-10-CM | POA: Insufficient documentation

## 2020-07-25 DIAGNOSIS — Z85850 Personal history of malignant neoplasm of thyroid: Secondary | ICD-10-CM | POA: Diagnosis not present

## 2020-07-25 DIAGNOSIS — S3992XA Unspecified injury of lower back, initial encounter: Secondary | ICD-10-CM | POA: Diagnosis not present

## 2020-07-25 DIAGNOSIS — Z79899 Other long term (current) drug therapy: Secondary | ICD-10-CM | POA: Insufficient documentation

## 2020-07-25 DIAGNOSIS — S335XXA Sprain of ligaments of lumbar spine, initial encounter: Secondary | ICD-10-CM | POA: Insufficient documentation

## 2020-07-25 DIAGNOSIS — X500XXA Overexertion from strenuous movement or load, initial encounter: Secondary | ICD-10-CM | POA: Insufficient documentation

## 2020-07-25 DIAGNOSIS — E039 Hypothyroidism, unspecified: Secondary | ICD-10-CM | POA: Insufficient documentation

## 2020-07-25 DIAGNOSIS — R109 Unspecified abdominal pain: Secondary | ICD-10-CM | POA: Insufficient documentation

## 2020-07-25 DIAGNOSIS — Z7982 Long term (current) use of aspirin: Secondary | ICD-10-CM | POA: Insufficient documentation

## 2020-07-25 DIAGNOSIS — Z8616 Personal history of COVID-19: Secondary | ICD-10-CM | POA: Insufficient documentation

## 2020-07-25 LAB — BASIC METABOLIC PANEL
Anion gap: 8 (ref 5–15)
BUN: 16 mg/dL (ref 8–23)
CO2: 26 mmol/L (ref 22–32)
Calcium: 9.1 mg/dL (ref 8.9–10.3)
Chloride: 103 mmol/L (ref 98–111)
Creatinine, Ser: 0.56 mg/dL (ref 0.44–1.00)
GFR, Estimated: >60 mL/min (ref >60)
Glucose, Bld: 115 mg/dL — ABNORMAL HIGH (ref 70–99)
Potassium: 3.9 mmol/L (ref 3.5–5.1)
Sodium: 137 mmol/L (ref 135–145)

## 2020-07-25 LAB — URINALYSIS, COMPLETE (UACMP) WITH MICROSCOPIC
Bacteria, UA: NONE SEEN
Bilirubin Urine: NEGATIVE
Glucose, UA: NEGATIVE mg/dL
Hgb urine dipstick: NEGATIVE
Ketones, ur: NEGATIVE mg/dL
Leukocytes,Ua: NEGATIVE
Nitrite: NEGATIVE
Protein, ur: NEGATIVE mg/dL
Specific Gravity, Urine: 1.002 — ABNORMAL LOW (ref 1.005–1.030)
pH: 7 (ref 5.0–8.0)

## 2020-07-25 LAB — CBC
HCT: 38.2 % (ref 36.0–46.0)
Hemoglobin: 12.5 g/dL (ref 12.0–15.0)
MCH: 28.1 pg (ref 26.0–34.0)
MCHC: 32.7 g/dL (ref 30.0–36.0)
MCV: 85.8 fL (ref 80.0–100.0)
Platelets: 413 10*3/uL — ABNORMAL HIGH (ref 150–400)
RBC: 4.45 MIL/uL (ref 3.87–5.11)
RDW: 14.5 % (ref 11.5–15.5)
WBC: 10.9 10*3/uL — ABNORMAL HIGH (ref 4.0–10.5)
nRBC: 0 % (ref 0.0–0.2)

## 2020-07-25 NOTE — ED Triage Notes (Signed)
Pt states she took an at home UTI test that came back positive this pm. Pt states she is having right flank and b ack pain. No fever, no vomiting.

## 2020-07-26 ENCOUNTER — Emergency Department: Payer: BC Managed Care – PPO

## 2020-07-26 ENCOUNTER — Emergency Department
Admission: EM | Admit: 2020-07-26 | Discharge: 2020-07-26 | Disposition: A | Discharge status: Home / Self Care | Payer: BC Managed Care – PPO | Attending: Emergency Medicine | Admitting: Emergency Medicine

## 2020-07-26 DIAGNOSIS — S335XXA Sprain of ligaments of lumbar spine, initial encounter: Secondary | ICD-10-CM

## 2020-07-26 DIAGNOSIS — R109 Unspecified abdominal pain: Secondary | ICD-10-CM | POA: Diagnosis not present

## 2020-07-26 DIAGNOSIS — Z90710 Acquired absence of both cervix and uterus: Secondary | ICD-10-CM | POA: Diagnosis not present

## 2020-07-26 MED ORDER — CYCLOBENZAPRINE HCL 10 MG PO TABS
10.0000 mg | ORAL_TABLET | Freq: Once | ORAL | Status: AC
  Administered 2020-07-26: 10 mg via ORAL
  Filled 2020-07-26: qty 1

## 2020-07-26 MED ORDER — LIDOCAINE 5 % EX PTCH
1.0000 | MEDICATED_PATCH | CUTANEOUS | Status: DC
  Administered 2020-07-26: 1 via TRANSDERMAL
  Filled 2020-07-26: qty 1

## 2020-07-26 MED ORDER — CYCLOBENZAPRINE HCL 10 MG PO TABS: 10.0000 mg | ORAL_TABLET | Freq: Three times a day (TID) | ORAL | 0 refills | Status: DC | PRN

## 2020-07-26 MED ORDER — IBUPROFEN 400 MG PO TABS
600.0000 mg | ORAL_TABLET | Freq: Once | ORAL | Status: AC
  Administered 2020-07-26: 600 mg via ORAL
  Filled 2020-07-26: qty 2

## 2020-07-26 MED ORDER — LIDOCAINE 5 % EX PTCH: 1.0000 | MEDICATED_PATCH | Freq: Two times a day (BID) | CUTANEOUS | 0 refills | Status: AC

## 2020-07-26 NOTE — ED Provider Notes (Signed)
[facility] Emergency Department Provider Note  ____________________________________________  Time seen: Approximately 12:54 AM  I have reviewed the triage vital signs and the nursing notes.   HISTORY  Chief Complaint Flank Pain   HPI Christie Ramirez is a 65 y.o. female with history as listed below who presents for evaluation of right flank pain.  Patient reports that the pain started 3 to 4 days ago.  Initially the pain was not bothering her much.  She has been lifting a lot of things at work and she attributed to a muscle spasm.  She describes the pain as a muscle soreness that is worse with movement of the torso.  Over the last 24 hours the pain has become more steady and bothering her slightly more.  She took an over-the-counter urine test at home and she was concerned that he was positive.  She denies nausea or vomiting, abdominal pain, chest pain, shortness of breath.  The pain is nonpleuritic in nature.  She denies any personal or family history of PE or DVT, recent travel immobilization, leg pain or swelling, hemoptysis, exogenous hormones.  Her only other complaint is 1 episode of black stool that happened 2 nights ago.  She reports that she had some hard small pieces of stool that looked black.  She has not had another bowel movement in the last 2 days.  She does not take any blood thinners.  She denies any history of GI bleed, peptic ulcer disease, denies any NSAID usage.  No saddle anesthesia, no lower extremity weakness or numbness, no urinary or bowel incontinence or retention.   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    Cancer [facility]) 1995   thyroid   Endometriosis 2009   Fibula fracture 11/11/2015   GERD (gastroesophageal reflux disease)    Hyperlipidemia     Hypertension    Hypothyroidism    Osteopenia    rt hip   Personal history of radiation therapy 2015   mammosite   Seasonal allergies    Sleep apnea    does not wear CPAP    Patient Active Problem List   Diagnosis Date Noted   Tenderness of neck 07/07/2020   Acute bilateral low back pain without sciatica 12/31/2019   History of COVID-19 12/05/2019   Atherosclerosis of abdominal aorta ([place]) 11/26/2019   Stable angina pectoris ([place]) 11/26/2019   Elevated alanine aminotransferase (ALT) level 11/07/2019   H/O ongoing treatment with raloxifene 11/07/2019   Vitamin D deficiency 12/12/2018   Left foot pain 11/06/2018   Breast lesion 10/19/2018   History of thyroid cancer 10/19/2018   Abdominal pain 10/19/2018   History of breast cancer 10/19/2018   Hoarseness 10/19/2018   Left shoulder pain 08/25/2018   Pain of left calf 08/25/2018   Lipoma of thigh 03/21/2018   Rupture of hamstring tendon 03/08/2018   Prediabetes 11/30/2017   Carotid artery plaque 03/05/2017   Encounter for general adult medical examination with abnormal findings 02/03/2017   Palpitations 02/03/2017   Rectal bleeding 02/03/2017   Night sweats 02/03/2017   Carpal tunnel syndrome 02/03/2017   GERD (gastroesophageal reflux disease) 02/03/2017   Neck pain 10/30/2016   Acute pain of left knee 07/27/2016   Acute pain of right shoulder 07/06/2016   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 11/10/2015   Fatigue 10/01/2015   Right hip pain 08/11/2015   Osteopenia 08/11/2015   Chronic bronchitis ([place]) 01/29/2015   History of herpes genitalis 09/23/2014   Endometriosis 09/23/2014   OSA (obstructive sleep apnea) 09/19/2013   Colon cancer screening 07/28/2013   Insomnia 06/09/2013   Elevated LFTs 11/18/2012   Hyperlipidemia with target low density lipoprotein (LDL) cholesterol less than 100 mg/dL 06/26/2011   Hypertension 05/18/2011   Anxiety 05/18/2011   Hypothyroidism 10/12/2010    Past  Surgical History:  Procedure Laterality Date   ABDOMINAL HYSTERECTOMY  2005   Martin DeFrancesco,MD   BREAST BIOPSY Right 04-02-13   INVASIVE MAMMARY CARCINOMA WITH FEATURES OF TUBULAR CARCINOMA,   BREAST CYST EXCISION Left 12/02/2018   Procedure: EXCISION LEFT BREAST SEBACEOUS CYST;  Surgeon: Jovita Kussmaul, MD;  Location: [facility];  Service: General;  Laterality: Left;   BREAST LUMPECTOMY Right 04/2013   INVASIVE MAMMARY CARCINOMA WITH FEATURES OF TUBULAR CARCINOMA,   BREAST SURGERY Right 05/01/13   wide excision   colectomy  2009   secondary to endometriosis DR. Tamala Julian   COLON SURGERY  December 2008   right hemicolectomy for cecal mass identified as endometrioma. No malignancy.   COLONOSCOPY  2009   Dr. Tamala Julian   COLONOSCOPY WITH PROPOFOL N/A 11/19/2018   Procedure: COLONOSCOPY WITH PROPOFOL;  Surgeon: Lucilla Lame, MD;  Location[facility] ENDOSCOPY;  Service: Endoscopy;  Laterality: N/A;   FOOT SURGERY  right   MASTOPEXY Bilateral 01/03/2019   Procedure: MASTOPEXY;  Surgeon: Irene Limbo, MD;  Location: [facility];  Service: Plastics;  Laterality: Bilateral;    Prior to Admission medications   Medication Sig Start Date End Date Taking? Authorizing Provider  cyclobenzaprine (FLEXERIL) 10 MG tablet Take 1 tablet (10 mg total) by mouth 3 (three) times daily as needed for muscle spasms. 07/26/20  Yes Harvard Zeiss, [place], MD  lidocaine (LIDODERM) 5 % Place 1 patch onto the skin every 12 (twelve) hours. Remove & Discard patch within 12 hours or as directed by MD 07/26/20 07/26/21 Yes Alfred Levins, [place], MD  albuterol (VENTOLIN HFA) 108 (90 Base) MCG/ACT inhaler Inhale 2 puffs into the lungs every 6 (six) hours as needed for wheezing or shortness of breath. 12/05/19   [name], NP  amLODipine (NORVASC) 5 MG tablet Take 2 tablets (10 mg total) by mouth daily. 12/09/19 09/04/20  Leone Haven, MD  aspirin 81 MG tablet Take 81 mg by mouth daily.    [provider]  atorvastatin (LIPITOR) 40 MG tablet TAKE ONE TABLET BY MOUTH DAILY Patient not taking: Reported on 07/07/2020 11/04/19   Leone Haven, MD  atorvastatin (LIPITOR) 80 MG tablet Take by mouth. 11/26/19 11/25/20  [provider]  CALCIUM-MAGNESIUM-ZINC PO Take by mouth 3 (three) times daily.    [provider]  cetirizine (ZYRTEC) 10 MG tablet SMARTSIG:1 Pill By Mouth Daily PRN 11/07/18   [provider]  Cholecalciferol (VITAMIN D-3 PO) Take 1,000 Units by mouth.    [provider]  escitalopram (LEXAPRO) 10 MG tablet TAKE ONE TABLET BY MOUTH DAILY 02/06/20   Leone Haven, MD  ezetimibe (ZETIA) 10 MG tablet TAKE ONE TABLET BY MOUTH DAILY 06/15/20   Leone Haven, MD  fluticasone (FLONASE) 50 MCG/ACT nasal spray Place 2 sprays into both nostrils daily.  11/07/18   [provider]  gabapentin (NEURONTIN) 100 MG capsule Take 1 capsule (100 mg total) by mouth  3 (three) times daily. 05/28/20   Edrick Kins, DPM  GARLIC PO Take 607 mg by mouth. Patient not taking: No sig reported    [provider]  levothyroxine (SYNTHROID) 137 MCG tablet Take 1 tablet (137 mcg total) by mouth daily before breakfast. 07/15/20   Leone Haven, MD  losartan (COZAAR) 100 MG tablet TAKE ONE TABLET BY MOUTH DAILY 05/25/20   Leone Haven, MD  Multiple Vitamins-Minerals (MULTIVITAMIN WITH MINERALS) tablet Take 1 tablet by mouth daily.    [provider]  ondansetron (ZOFRAN) 4 MG tablet Take 1 tablet (4 mg total) by mouth every 8 (eight) hours as needed for up to 10 doses for nausea or vomiting. Patient not taking: No sig reported 12/04/19   Lucrezia Starch, MD  predniSONE (DELTASONE) 10 MG tablet Take 6 tablets ( 60 mg total) on day 1 and take 5 tablets (50 mg total) on day 2, and decrease by 1 tablet (10 mg) every day until off in 6 days. Patient not taking: No sig reported 12/05/19   [name], NP  traZODone (DESYREL)  50 MG tablet TAKE TWO TABLETS BY MOUTH EVERY NIGHT AT BEDTIME AS NEEDED FOR SLEEP Patient taking differently: Pt reports she is taking three tablets per night. 03/05/20   Leone Haven, MD  vitamin C (ASCORBIC ACID) 500 MG tablet Take 500 mg by mouth daily.    [provider]  clonazePAM (KLONOPIN) 1 MG tablet Take 1 tablet (1 mg total) by mouth daily as needed for anxiety. Patient not taking: No sig reported 12/12/18 12/04/19  McLean-Scocuzza, Nino Glow, MD    Allergies Codeine, Codeine, Iodinated diagnostic agents, Iodine, Iodine, Penicillins, and Penicillins  Family History  Problem Relation Age of Onset   Colon polyps Father        age 27   Heart disease Father    Bone cancer Father    Heart disease Mother    Rheumatic fever Mother    Heart disease Paternal Grandmother    Heart disease Paternal Grandfather    Cancer Neg Hx    Diabetes Neg Hx    Breast cancer Neg Hx     Social History Social History   Tobacco Use   Smoking status: Never   Smokeless tobacco: Never  Vaping Use   Vaping Use: Never used  Substance Use Topics   Alcohol use: Not Currently    Alcohol/week: 0.0 standard drinks    Comment: wine socially   Drug use: No    Review of Systems  Constitutional: Negative for fever. Eyes: Negative for visual changes. ENT: Negative for sore throat. Neck: No neck pain  Cardiovascular: Negative for chest pain. Respiratory: Negative for shortness of breath. Gastrointestinal: Negative for abdominal pain, vomiting or diarrhea. Genitourinary: Negative for dysuria. Musculoskeletal: Negative for back pain. + R flank pain Skin: Negative for rash. Neurological: Negative for headaches, weakness or numbness. Psych: No SI or HI  ____________________________________________   PHYSICAL EXAM:  VITAL SIGNS: ED Triage Vitals  Enc Vitals Group     BP 07/25/20 2146 (!) 147/107     Pulse Rate 07/25/20 2146 82     Resp 07/25/20 2146 16     Temp 07/25/20 2146  98.8 F (37.1 C)     Temp Source 07/25/20 2146 Oral     SpO2 --      Weight 07/25/20 2147 155 lb (70.3 kg)     Height 07/25/20 2147 5\' 1"  (1.549 m)  Head Circumference --      Peak Flow --      Pain Score 07/25/20 2147 9     Pain Loc --      Pain Edu? --      Excl. in [place]? --     Constitutional: Alert and oriented. Well appearing and in no apparent distress. HEENT:      Head: Normocephalic and atraumatic.         Eyes: Conjunctivae are normal. Sclera is non-icteric.       Mouth/Throat: Mucous membranes are moist.       Neck: Supple with no signs of meningismus. Cardiovascular: Regular rate and rhythm. No murmurs, gallops, or rubs. 2+ symmetrical distal pulses are present in all extremities. No JVD. Respiratory: Normal respiratory effort. Lungs are clear to auscultation bilaterally.  Gastrointestinal: Soft, non tender, and non distended with positive bowel sounds. No rebound or guarding. Genitourinary: No CVA tenderness.  Musculoskeletal:  No edema, cyanosis, or erythema of extremities.  Patient is tender to palpation of the muscles of the right back Neurologic: Normal speech and language. Face is symmetric. Moving all extremities. No gross focal neurologic deficits are appreciated. Skin: Skin is warm, dry and intact. No rash noted. Psychiatric: Mood and affect are normal. Speech and behavior are normal.  ____________________________________________   LABS (all labs ordered are listed, but only abnormal results are displayed)  Labs Reviewed  URINALYSIS, COMPLETE (UACMP) WITH MICROSCOPIC - Abnormal; Notable for the following components:      Result Value   Color, Urine YELLOW (*)    APPearance CLEAR (*)    Specific Gravity, Urine 1.002 (*)    All other components within normal limits  BASIC METABOLIC PANEL - Abnormal; Notable for the following components:   Glucose, Bld 115 (*)    All other components within normal limits  CBC - Abnormal; Notable for the following  components:   WBC 10.9 (*)    Platelets 413 (*)    All other components within normal limits   ____________________________________________  EKG  none  ____________________________________________  RADIOLOGY  I have personally reviewed the images performed during this visit and I agree with the Radiologist's read.   Interpretation by Radiologist:  CT Renal Stone Study  Result Date: 07/26/2020 CLINICAL DATA:  Flank pain EXAM: CT ABDOMEN AND PELVIS WITHOUT CONTRAST TECHNIQUE: Multidetector CT imaging of the abdomen and pelvis was performed following the standard protocol without IV contrast. COMPARISON:  11/25/2018 FINDINGS: LOWER CHEST: Normal. HEPATOBILIARY: Normal hepatic contours and density. No intra- or extrahepatic biliary dilatation. Normal gallbladder PANCREAS: Normal pancreas. No ductal dilatation or peripancreatic fluid collection. SPLEEN: Normal. ADRENALS/URINARY TRACT: The adrenal glands are normal. No hydronephrosis, nephroureterolithiasis or solid renal mass. The urinary bladder is normal for degree of distention STOMACH/BOWEL: There is no hiatal hernia. Normal duodenal course and caliber. No small bowel dilatation or inflammation. Staple line in the right lower quadrant:. The appendix is not visualized. No right lower quadrant inflammation or free fluid. VASCULAR/LYMPHATIC: Normal course and caliber of the major abdominal vessels. No abdominal or pelvic lymphadenopathy. REPRODUCTIVE: Status post hysterectomy. No adnexal mass. MUSCULOSKELETAL. No bony spinal canal stenosis or focal osseous abnormality. OTHER: None. IMPRESSION: No acute abnormality of the abdomen or pelvis. Electronically Signed   By: Ulyses Jarred M.D.   On: 07/26/2020 01:40     ____________________________________________   PROCEDURES  Procedure(s) performed: None Procedures Critical Care performed:  None ____________________________________________   INITIAL IMPRESSION / ASSESSMENT AND PLAN / ED  COURSE  65 y.o. female with history as listed below who presents for evaluation of right flank pain.  Pain is reproducible with palpation of the muscles of the right back, worse with movement of the torso, started after she was lifting a lot of heavy things at work.  She is otherwise well-appearing in no distress with normal work of breathing and normal sats.  There is no rash on her skin.  She describes the pain as dull/muscle soreness.  There is no pleuritic pain or shortness of breath.  Differential diagnoses including muscle strain versus shingles versus compression fracture versus UTI versus pyelonephritis versus kidney stone.  Doubt PE with reproducible pain, no tachypnea, no tachycardia, no hypoxia.  CT with no signs of kidney stones or any other abnormalities, visualized by me and confirmed by radiology.  UA with no evidence of urinary tract infection.  Chemistry panel with no acute findings.  Patient does have a very minimal leukocytosis with white count of 10.9 and platelets of 413 of unknown etiology.  Will treat as muscle strain with a lidocaine patch, ibuprofen and Flexeril.  Recommend applying heat.  Discussed follow-up with primary care doctor.  Recommended return to the emergency room if the pain becomes pleuritic if she has shortness of breath, abdominal pain, chest pain or fever.  Also discussed follow-up with PCP for that episode of black stool that she had 2 nights ago.  Has not had a bowel movement since then.  Hemoglobin here is stable.  No prior history of GI bleed, she is not on blood thinners.  No signs of active bleeding at this time.       _____________________________________________ Please note:  Patient was evaluated in Emergency Department today for the symptoms described in the history of present illness. Patient was evaluated in the context of the global COVID-19 pandemic, which necessitated consideration that the patient might be at risk for infection with the  SARS-CoV-2 virus that causes COVID-19. Institutional protocols and algorithms that pertain to the evaluation of patients at risk for COVID-19 are in a state of rapid change based on information released by regulatory bodies including the CDC and federal and state organizations. These policies and algorithms were followed during the patient's care in the ED.  Some ED evaluations and interventions may be delayed as a result of limited staffing during the pandemic.   [place] Controlled Substance Database was reviewed by me. ____________________________________________   FINAL CLINICAL IMPRESSION(S) / ED DIAGNOSES   Final diagnoses:  Lumbar sprain, initial encounter      NEW MEDICATIONS STARTED DURING THIS VISIT:  ED Discharge Orders          Ordered    cyclobenzaprine (FLEXERIL) 10 MG tablet  3 times daily PRN        07/26/20 0221    lidocaine (LIDODERM) 5 %  Every 12 hours        07/26/20 0221             Note:  This document was prepared using Dragon voice recognition software and may include unintentional dictation errors.    Rudene Re, MD 07/26/20 [phone]

## 2020-07-26 NOTE — Discharge Instructions (Addendum)
Take Tylenol or ibuprofen for pain.  Flexeril was needed as a muscle relaxant.  Lidocaine patches as prescribed.  Apply heat.  Stretch.  If your pain becomes worse or sharp, if you have shortness of breath, chest pain, abdominal pain, numbness or weakness of your legs, numbness of your groin, difficulty with urination or bowel movements or incontinence please return to the emergency room for further evaluation.  Otherwise follow-up with your doctor in 3 days

## 2020-07-27 ENCOUNTER — Telehealth: Payer: Self-pay | Admitting: Family Medicine

## 2020-07-27 DIAGNOSIS — R7989 Other specified abnormal findings of blood chemistry: Secondary | ICD-10-CM

## 2020-07-27 NOTE — Telephone Encounter (Signed)
Patient called and would like to speak to [facility] about the lab results he gave her.

## 2020-07-27 NOTE — Telephone Encounter (Signed)
Spoken  to patient, she wanted to know does she need to still hold her lipitor still. Patient will await provider orders for when to start back

## 2020-07-28 ENCOUNTER — Ambulatory Visit
Admission: RE | Admit: 2020-07-28 | Discharge: 2020-07-28 | Disposition: A | Discharge status: Home / Self Care | Payer: BC Managed Care – PPO | Source: Ambulatory Visit | Attending: Family Medicine | Admitting: Family Medicine

## 2020-07-28 ENCOUNTER — Other Ambulatory Visit: Payer: Self-pay

## 2020-07-28 DIAGNOSIS — M542 Cervicalgia: Secondary | ICD-10-CM

## 2020-07-28 DIAGNOSIS — E890 Postprocedural hypothyroidism: Secondary | ICD-10-CM | POA: Diagnosis not present

## 2020-07-28 NOTE — Telephone Encounter (Signed)
Hold for now and disc further holding and repeating liver enzymes at pcp visit 08/18/20

## 2020-07-30 ENCOUNTER — Encounter: Payer: BC Managed Care – PPO | Admitting: Podiatry

## 2020-07-30 NOTE — Telephone Encounter (Signed)
I agree it is fine to hold for now.  I will place the referral back to GI for her elevated LFTs.  We can plan on rechecking her liver enzymes in late July.

## 2020-07-30 NOTE — Addendum Note (Signed)
Addended by: Caryl Bis, Darra Rosa G on: 07/30/2020 01:15 PM   Modules accepted: Orders

## 2020-08-03 NOTE — Progress Notes (Signed)
Left message for patient to return call back for [place] results.

## 2020-08-06 ENCOUNTER — Other Ambulatory Visit: Payer: Self-pay

## 2020-08-06 ENCOUNTER — Ambulatory Visit (INDEPENDENT_AMBULATORY_CARE_PROVIDER_SITE_OTHER): Payer: BC Managed Care – PPO

## 2020-08-06 ENCOUNTER — Ambulatory Visit: Payer: BC Managed Care – PPO | Admitting: Podiatry

## 2020-08-06 DIAGNOSIS — M7751 Other enthesopathy of right foot: Secondary | ICD-10-CM | POA: Diagnosis not present

## 2020-08-06 DIAGNOSIS — M205X1 Other deformities of toe(s) (acquired), right foot: Secondary | ICD-10-CM

## 2020-08-06 MED ORDER — BETAMETHASONE SOD PHOS & ACET 6 (3-3) MG/ML IJ SUSP: 3.0000 mg | Freq: Once | INTRAMUSCULAR | Status: AC

## 2020-08-06 NOTE — Progress Notes (Signed)
HPI: 65 y.o. female presenting today for evaluation of bilateral foot pain. Patient states that she has recently been dealing with heel pain for several months now. No injury. Aggravated by certain shoes and going barefoot.   Patient states that her RT great toe is tolerable. Patient does have history of cheilectomy surgery to the right great toe joint several years ago.  She states that over time progressively she has had increased pain and tenderness with walking to the right great toe joint.    Patient also complains of RT 3rd MTPJ pain. She has had this pain before and she received injections which have helped. 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    Cancer [facility]) 1995   thyroid   Endometriosis 2009   Fibula fracture 11/11/2015   GERD (gastroesophageal reflux disease)    Hyperlipidemia    Hypertension    Hypothyroidism    Osteopenia    rt hip   Personal history of radiation therapy 2015   mammosite   Seasonal allergies    Sleep apnea    does not wear CPAP     Physical Exam: General: The patient is alert and oriented x3 in no acute distress.  Dermatology: Skin is warm, dry and supple bilateral lower extremities. Negative for open lesions or macerations.  Vascular: Palpable pedal pulses bilaterally. No edema or erythema noted. Capillary refill within normal limits.  Neurological: Epicritic and protective threshold grossly intact bilaterally.   Musculoskeletal Exam: Range of motion within normal limits to all pedal and ankle joints bilateral. Muscle strength 5/5 in all groups bilateral.  Pain on palpation range of motion the second MTPJ of the right foot.  There is also pain on palpation range of motion the first MTPJ of the right foot.   Clinical evidence of hypertrophic spurring noted to the first MTPJ.  Limited range of motion to the first MTPJ consistent with findings of hallux limitus.  Pain on palpation to the bilateral heels at the insertion of the plantar fascia consistent with plantar fasciitis  Radiographic Exam: Normal osseous mineralization.  Joint space degenerative changes and periarticular spurring noted to the first MTPJ of the right foot  Assessment: 1.  Chronic symptomatic hallux limitus right 2.  Second MTPJ capsulitis right 3.  Plantar fasciitis bilateral   Plan of Care:  1. Patient evaluated.  2.  Injection of 0.5 cc Celestone Soluspan injected in the second MTPJ of the right foot 3.  Patient has rescheduled surgery to December of this year to address her hallux limitus to the right foot.  If the patient's second toe continues to be symptomatic we may need to address the second toe and we do surgery which may consist of a Weil shortening osteotomy 4.  Continue Motrin 600 mg daily which she states helps alleviate her pain 5.  Return to clinic 2 months prior to surgery or if her plantar fascial pain recurs.  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 Edrick Kins, DPM  [address], [place] [address]                Office [phone]  Fax [phone]

## 2020-08-07 ENCOUNTER — Other Ambulatory Visit: Payer: Self-pay | Admitting: Family Medicine

## 2020-08-18 ENCOUNTER — Ambulatory Visit: Payer: BC Managed Care – PPO | Admitting: Family Medicine

## 2020-08-20 DIAGNOSIS — J029 Acute pharyngitis, unspecified: Secondary | ICD-10-CM | POA: Diagnosis not present

## 2020-08-20 DIAGNOSIS — R6883 Chills (without fever): Secondary | ICD-10-CM | POA: Diagnosis not present

## 2020-08-20 DIAGNOSIS — Z20822 Contact with and (suspected) exposure to covid-19: Secondary | ICD-10-CM | POA: Diagnosis not present

## 2020-08-25 ENCOUNTER — Telehealth: Payer: Self-pay | Admitting: *Deleted

## 2020-08-25 DIAGNOSIS — E039 Hypothyroidism, unspecified: Secondary | ICD-10-CM

## 2020-08-25 NOTE — Telephone Encounter (Signed)
Please place future orders for lab appt.    Pt has lab appt at 7:30am on 8/4

## 2020-08-25 NOTE — Telephone Encounter (Signed)
Order placed

## 2020-08-26 ENCOUNTER — Other Ambulatory Visit: Payer: Self-pay

## 2020-08-26 ENCOUNTER — Other Ambulatory Visit (INDEPENDENT_AMBULATORY_CARE_PROVIDER_SITE_OTHER): Payer: BC Managed Care – PPO

## 2020-08-26 DIAGNOSIS — E039 Hypothyroidism, unspecified: Secondary | ICD-10-CM | POA: Diagnosis not present

## 2020-08-26 DIAGNOSIS — Z853 Personal history of malignant neoplasm of breast: Secondary | ICD-10-CM

## 2020-08-26 LAB — TSH: TSH: 0.37 u[IU]/mL (ref 0.35–5.50)

## 2020-08-26 NOTE — Addendum Note (Signed)
Addended by: Neta Ehlers on: 08/26/2020 08:18 AM   Modules accepted: Orders

## 2020-08-27 ENCOUNTER — Ambulatory Visit: Payer: BC Managed Care – PPO | Admitting: Family Medicine

## 2020-08-27 ENCOUNTER — Telehealth: Payer: Self-pay

## 2020-08-27 DIAGNOSIS — F32A Depression, unspecified: Secondary | ICD-10-CM

## 2020-08-27 DIAGNOSIS — F411 Generalized anxiety disorder: Secondary | ICD-10-CM | POA: Diagnosis not present

## 2020-08-27 DIAGNOSIS — K921 Melena: Secondary | ICD-10-CM | POA: Insufficient documentation

## 2020-08-27 DIAGNOSIS — R7989 Other specified abnormal findings of blood chemistry: Secondary | ICD-10-CM

## 2020-08-27 DIAGNOSIS — F419 Anxiety disorder, unspecified: Secondary | ICD-10-CM

## 2020-08-27 DIAGNOSIS — M542 Cervicalgia: Secondary | ICD-10-CM

## 2020-08-27 MED ORDER — ESCITALOPRAM OXALATE 10 MG PO TABS: 10.0000 mg | ORAL_TABLET | Freq: Every day | ORAL | 1 refills | Status: DC

## 2020-08-27 MED ORDER — HYDROXYZINE HCL 10 MG PO TABS: 10.0000 mg | ORAL_TABLET | Freq: Three times a day (TID) | ORAL | 0 refills | Status: DC | PRN

## 2020-08-27 NOTE — Patient Instructions (Signed)
Nice to see you. Please start on the Lexapro and hydroxyzine. If the hydroxyzine makes you excessively drowsy please let us know. We will call you on Monday to see what your pulse has been at home.

## 2020-08-27 NOTE — Telephone Encounter (Signed)
Discussed with our clinical pharmacist. We determined that this combination would be ok to proceed with at this time.

## 2020-08-27 NOTE — Progress Notes (Signed)
[name], MD Phone: [phone]  Christie Ramirez is a 65 y.o. female who presents today for follow-up.  Anterior neck pain: Patient notes this has been progressively improving.  She had an ultrasound that revealed no findings concerning for return of thyroid cancer.  Elevated liver enzymes: Patient notes no current alcohol use.  No Tylenol use.  No abdominal pain.  She did try stopping her cholesterol medicine though liver enzymes remain elevated.  She sees GI next month.  Black stool: She reported this to the ED.  She notes for about a week she had normal formed stools though the color was black.  She reports eating a lot of black olives that week.  She has had no recurrence since this resolved.  Anxiety: Patient does note some anxiety and depression.  This has worsened over the last couple weeks.  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  She wonders what the purpose is for.  She notes her mother passed away at age 92 and she wonders if she is going to pass away at that age.  She was previously on Lexapro and clonazepam but came off of those things as she was doing well.  No SI or HI.  She does note she took 2 partial tablets of clonazepam recently to take the edge off.  Social History   Tobacco Use  Smoking Status Never  Smokeless Tobacco Never    Current Outpatient Medications on File Prior to Visit  Medication Sig Dispense Refill   albuterol (VENTOLIN HFA) 108 (90 Base) MCG/ACT inhaler Inhale 2 puffs into the lungs every 6 (six) hours as needed for wheezing or shortness of breath. 1 each 1   amLODipine (NORVASC) 5 MG tablet Take 2 tablets (10 mg total) by mouth daily. 180 tablet 2   aspirin 81 MG tablet Take 81 mg by mouth daily.     atorvastatin (LIPITOR) 40 MG tablet TAKE ONE TABLET BY MOUTH DAILY 90 tablet 0   atorvastatin (LIPITOR) 80 MG tablet Take by mouth.     CALCIUM-MAGNESIUM-ZINC PO Take by mouth 3  (three) times daily.     cetirizine (ZYRTEC) 10 MG tablet SMARTSIG:1 Pill By Mouth Daily PRN     Cholecalciferol (VITAMIN D-3 PO) Take 1,000 Units by mouth.     cyclobenzaprine (FLEXERIL) 10 MG tablet Take 1 tablet (10 mg total) by mouth 3 (three) times daily as needed for muscle spasms. 30 tablet 0   ezetimibe (ZETIA) 10 MG tablet TAKE ONE TABLET BY MOUTH DAILY 90 tablet 1   fluticasone (FLONASE) 50 MCG/ACT nasal spray Place 2 sprays into both nostrils daily.      gabapentin (NEURONTIN) 100 MG capsule Take 1 capsule (100 mg total) by mouth 3 (three) times daily. 90 capsule 1   GARLIC PO Take 0000000 mg by mouth.     levothyroxine (SYNTHROID) 137 MCG tablet Take 1 tablet (137 mcg total) by mouth daily before breakfast. 90 tablet 1   lidocaine (LIDODERM) 5 % Place 1 patch onto the skin every 12 (twelve) hours. Remove & Discard patch within 12 hours or as directed by MD 10 patch 0   losartan (COZAAR) 100 MG tablet TAKE ONE TABLET BY MOUTH DAILY 90 tablet 1   Multiple Vitamins-Minerals (MULTIVITAMIN WITH MINERALS) tablet Take 1 tablet by mouth daily.     ondansetron (ZOFRAN) 4 MG tablet Take 1 tablet (4 mg total) by mouth every 8 (eight) hours as needed  for up to 10 doses for nausea or vomiting. 10 tablet 0   predniSONE (DELTASONE) 10 MG tablet Take 6 tablets ( 60 mg total) on day 1 and take 5 tablets (50 mg total) on day 2, and decrease by 1 tablet (10 mg) every day until off in 6 days. 21 tablet 0   traZODone (DESYREL) 50 MG tablet TAKE TWO TABLETS BY MOUTH EVERY NIGHT AT BEDTIME AS NEEDED FOR SLEEP (Patient taking differently: Pt reports she is taking three tablets per night.) 180 tablet 1   vitamin C (ASCORBIC ACID) 500 MG tablet Take 500 mg by mouth daily.     [DISCONTINUED] clonazePAM (KLONOPIN) 1 MG tablet Take 1 tablet (1 mg total) by mouth daily as needed for anxiety. (Patient not taking: No sig reported) 30 tablet 2   Current Facility-Administered Medications on File Prior to Visit  Medication  Dose Route Frequency Provider Last Rate Last Admin   betamethasone acetate-betamethasone sodium phosphate (CELESTONE) injection 3 mg  3 mg Intra-articular Once Edrick Kins, DPM         ROS see history of present illness  Objective  Physical Exam Vitals:   08/27/20 1405 08/27/20 1422  BP: 118/80   Pulse: (!) 126 (!) 109  Temp: (!) 97.5 F (36.4 C)   SpO2: 97%     BP Readings from Last 3 Encounters:  08/27/20 118/80  07/26/20 120/81  07/07/20 120/70   Wt Readings from Last 3 Encounters:  08/27/20 155 lb 9.6 oz (70.6 kg)  07/25/20 155 lb (70.3 kg)  07/07/20 158 lb 9.6 oz (71.9 kg)    Physical Exam Constitutional:      General: She is not in acute distress.    Appearance: She is not diaphoretic.  Neck:   Cardiovascular:     Rate and Rhythm: Regular rhythm. Tachycardia present.     Heart sounds: Normal heart sounds.  Pulmonary:     Effort: Pulmonary effort is normal.     Breath sounds: Normal breath sounds.  Skin:    General: Skin is warm and dry.  Neurological:     Mental Status: She is alert.     Assessment/Plan: Please see individual problem list.  Problem List Items Addressed This Visit     Anxiety and depression    The patient has had recurrence of her anxiety and depression related to life issues and work issues.  Discussed restarting Lexapro 10 mg once daily.  We will also give her a trial of hydroxyzine.  Discussed risk of drowsiness with hydroxyzine.  If she becomes excessively drowsy she will let us know.  We will follow-up in 6 weeks.  The patient was mildly tachycardic today likely in the setting of anxiety.  Discussed monitoring this at home and I will have somebody contact her on Monday to follow-up on her pulse.       Relevant Medications   hydrOXYzine (ATARAX/VISTARIL) 10 MG tablet   escitalopram (LEXAPRO) 10 MG tablet   Black stool    Undetermined cause has the consistency of the stool does not seem to be consistent with melena.  She has  not had any recurrence.  She will see GI and mention this to them when she has her appointment.       Elevated LFTs    Continues to have issues with elevated liver enzymes.  At this point she will see GI as appointment in September.       Tenderness of neck    The patient  had a reassuring thyroid ultrasound.  Likely this is related to a muscular strain.  Given its improvement she will monitor.  If this does not resolve in the next week or 2 she will let us know and we would consider CT imaging.       Other Visit Diagnoses     GAD (generalized anxiety disorder)       Relevant Medications   hydrOXYzine (ATARAX/VISTARIL) 10 MG tablet   escitalopram (LEXAPRO) 10 MG tablet       Return in about 6 weeks (around 10/08/2020) for Anxiety.  This visit occurred during the SARS-CoV-2 public health emergency.  Safety protocols were in place, including screening questions prior to the visit, additional usage of staff PPE, and extensive cleaning of exam room while observing appropriate contact time as indicated for disinfecting solutions.    [name], MD [place]

## 2020-08-27 NOTE — Assessment & Plan Note (Signed)
Undetermined cause has the consistency of the stool does not seem to be consistent with melena.  She has not had any recurrence.  She will see GI and mention this to them when she has her appointment.

## 2020-08-27 NOTE — Assessment & Plan Note (Addendum)
The patient has had recurrence of her anxiety and depression related to life issues and work issues.  Discussed restarting Lexapro 10 mg once daily.  We will also give her a trial of hydroxyzine.  Discussed risk of drowsiness with hydroxyzine.  If she becomes excessively drowsy she will let us know.  We will follow-up in 6 weeks.  The patient was mildly tachycardic today likely in the setting of anxiety.  Discussed monitoring this at home and I will have somebody contact her on Monday to follow-up on her pulse.

## 2020-08-27 NOTE — Assessment & Plan Note (Signed)
The patient had a reassuring thyroid ultrasound.  Likely this is related to a muscular strain.  Given its improvement she will monitor.  If this does not resolve in the next week or 2 she will let us know and we would consider CT imaging.

## 2020-08-27 NOTE — Assessment & Plan Note (Signed)
Continues to have issues with elevated liver enzymes.  At this point she will see GI as appointment in September.

## 2020-09-01 DIAGNOSIS — Z20822 Contact with and (suspected) exposure to covid-19: Secondary | ICD-10-CM | POA: Diagnosis not present

## 2020-09-01 DIAGNOSIS — N3000 Acute cystitis without hematuria: Secondary | ICD-10-CM | POA: Diagnosis not present

## 2020-09-02 ENCOUNTER — Other Ambulatory Visit: Payer: Self-pay | Admitting: Family Medicine

## 2020-09-02 DIAGNOSIS — G4700 Insomnia, unspecified: Secondary | ICD-10-CM

## 2020-10-06 ENCOUNTER — Other Ambulatory Visit (HOSPITAL_COMMUNITY): Payer: Self-pay | Admitting: Gastroenterology

## 2020-10-06 ENCOUNTER — Other Ambulatory Visit: Payer: Self-pay | Admitting: Gastroenterology

## 2020-10-06 DIAGNOSIS — R7989 Other specified abnormal findings of blood chemistry: Secondary | ICD-10-CM

## 2020-10-19 ENCOUNTER — Other Ambulatory Visit: Payer: Self-pay

## 2020-10-19 ENCOUNTER — Ambulatory Visit
Admission: RE | Admit: 2020-10-19 | Discharge: 2020-10-19 | Disposition: A | Discharge status: Home / Self Care | Payer: BC Managed Care – PPO | Source: Ambulatory Visit | Attending: Gastroenterology | Admitting: Gastroenterology

## 2020-10-19 ENCOUNTER — Ambulatory Visit: Payer: BC Managed Care – PPO | Admitting: Family Medicine

## 2020-10-19 DIAGNOSIS — K769 Liver disease, unspecified: Secondary | ICD-10-CM | POA: Diagnosis not present

## 2020-10-19 DIAGNOSIS — R7989 Other specified abnormal findings of blood chemistry: Secondary | ICD-10-CM

## 2020-11-03 ENCOUNTER — Encounter: Payer: Self-pay | Admitting: Family Medicine

## 2020-11-03 ENCOUNTER — Other Ambulatory Visit: Payer: Self-pay

## 2020-11-03 ENCOUNTER — Ambulatory Visit: Payer: BC Managed Care – PPO | Admitting: Family Medicine

## 2020-11-03 DIAGNOSIS — F32A Depression, unspecified: Secondary | ICD-10-CM

## 2020-11-03 DIAGNOSIS — F419 Anxiety disorder, unspecified: Secondary | ICD-10-CM | POA: Diagnosis not present

## 2020-11-03 DIAGNOSIS — F411 Generalized anxiety disorder: Secondary | ICD-10-CM | POA: Diagnosis not present

## 2020-11-03 MED ORDER — CLONAZEPAM 0.5 MG PO TABS: 0.5000 mg | ORAL_TABLET | Freq: Two times a day (BID) | ORAL | 0 refills | Status: DC | PRN

## 2020-11-03 MED ORDER — ESCITALOPRAM OXALATE 20 MG PO TABS: 20.0000 mg | ORAL_TABLET | Freq: Every day | ORAL | 1 refills | Status: DC

## 2020-11-03 NOTE — Assessment & Plan Note (Addendum)
Worsened with recent work events.  Her heart racing and chest discomfort are related to panic attacks.  We will increase her Lexapro to 20 mg once daily.  We will give her a small amount of clonazepam to take for significant anxiety.  We will refer her for therapy.  She was advised to seek medical attention for any active suicidal thoughts or changing symptoms regarding her panic attacks.  Discussed symptoms that would indicate a cardiac event and she will seek medical attention if those occur..  Follow-up in 4 to 6 weeks.

## 2020-11-03 NOTE — Patient Instructions (Signed)
Nice to see you. Somebody will call you about the therapist. We will increase your Lexapro to 20 mg once daily. You can take the clonazepam if you really need it for anxiety or a panic attack.  This will be a short-term supply. If you develop any thoughts of harming yourself please seek medical attention immediately in the emergency room.

## 2020-11-03 NOTE — Progress Notes (Signed)
[name], MD Phone: [phone]  Christie Ramirez is a 65 y.o. female who presents today for f/u.  Anxiety/depression: This has worsened over the last week or so.  She had somebody at her job file a discrimination complaint against her.  That increased her anxiety and depression significantly.  She has never had anything like that happen at work.  She has been on Lexapro 10 mg once daily.  She has had thoughts of being better off dead though no active suicidal ideation.  She notes she would never hurt herself.  She wakes up at night in a panic with her heart racing.  She has had some central chest sharp discomfort only when she is laying down in bed.  No radiation.  No shortness of breath.  No exertional chest pain.  She has continued to walk for exercise.  She has increased her trazodone from 100 mg nightly to 125 mg nightly.  She is scheduled with psychiatry in January.  Social History   Tobacco Use  Smoking Status Never  Smokeless Tobacco Never    Current Outpatient Medications on File Prior to Visit  Medication Sig Dispense Refill   albuterol (VENTOLIN HFA) 108 (90 Base) MCG/ACT inhaler Inhale 2 puffs into the lungs every 6 (six) hours as needed for wheezing or shortness of breath. 1 each 1   aspirin 81 MG tablet Take 81 mg by mouth daily.     atorvastatin (LIPITOR) 40 MG tablet TAKE ONE TABLET BY MOUTH DAILY 90 tablet 0   atorvastatin (LIPITOR) 80 MG tablet Take by mouth.     CALCIUM-MAGNESIUM-ZINC PO Take by mouth 3 (three) times daily.     cetirizine (ZYRTEC) 10 MG tablet SMARTSIG:1 Pill By Mouth Daily PRN     Cholecalciferol (VITAMIN D-3 PO) Take 1,000 Units by mouth.     cyclobenzaprine (FLEXERIL) 10 MG tablet Take 1 tablet (10 mg total) by mouth 3 (three) times daily as needed for muscle spasms. 30 tablet 0   ezetimibe (ZETIA) 10 MG tablet TAKE ONE TABLET BY MOUTH DAILY 90 tablet 1   fluticasone (FLONASE) 50 MCG/ACT nasal spray Place 2 sprays into both nostrils daily.       gabapentin (NEURONTIN) 100 MG capsule Take 1 capsule (100 mg total) by mouth 3 (three) times daily. 90 capsule 1   GARLIC PO Take 099 mg by mouth.     levothyroxine (SYNTHROID) 137 MCG tablet Take 1 tablet (137 mcg total) by mouth daily before breakfast. 90 tablet 1   lidocaine (LIDODERM) 5 % Place 1 patch onto the skin every 12 (twelve) hours. Remove & Discard patch within 12 hours or as directed by MD 10 patch 0   losartan (COZAAR) 100 MG tablet TAKE ONE TABLET BY MOUTH DAILY 90 tablet 1   Multiple Vitamins-Minerals (MULTIVITAMIN WITH MINERALS) tablet Take 1 tablet by mouth daily.     ondansetron (ZOFRAN) 4 MG tablet Take 1 tablet (4 mg total) by mouth every 8 (eight) hours as needed for up to 10 doses for nausea or vomiting. 10 tablet 0   predniSONE (DELTASONE) 10 MG tablet Take 6 tablets ( 60 mg total) on day 1 and take 5 tablets (50 mg total) on day 2, and decrease by 1 tablet (10 mg) every day until off in 6 days. 21 tablet 0   traZODone (DESYREL) 50 MG tablet TAKE TWO TABLETS BY MOUTH ONCE NIGHTLY AT BEDTIME AS NEEDED FOR SLEEP 180 tablet 1   vitamin C (ASCORBIC ACID) 500 MG tablet  Take 500 mg by mouth daily.     amLODipine (NORVASC) 5 MG tablet Take 2 tablets (10 mg total) by mouth daily. 180 tablet 2   Current Facility-Administered Medications on File Prior to Visit  Medication Dose Route Frequency Provider Last Rate Last Admin   betamethasone acetate-betamethasone sodium phosphate (CELESTONE) injection 3 mg  3 mg Intra-articular Once Edrick Kins, DPM         ROS see history of present illness  Objective  Physical Exam Vitals:   11/03/20 1529  BP: 128/70  Pulse: 98  Temp: (!) 96.6 F (35.9 C)  SpO2: 99%    BP Readings from Last 3 Encounters:  11/03/20 128/70  08/27/20 118/80  07/26/20 120/81   Wt Readings from Last 3 Encounters:  11/03/20 147 lb 12.8 oz (67 kg)  08/27/20 155 lb 9.6 oz (70.6 kg)  07/25/20 155 lb (70.3 kg)    Physical Exam Constitutional:       General: She is not in acute distress.    Appearance: She is not diaphoretic.  Cardiovascular:     Rate and Rhythm: Normal rate and regular rhythm.     Heart sounds: Normal heart sounds.  Pulmonary:     Effort: Pulmonary effort is normal.     Breath sounds: Normal breath sounds.  Skin:    General: Skin is warm and dry.  Neurological:     Mental Status: She is alert.     Assessment/Plan: Please see individual problem list.  Problem List Items Addressed This Visit     Anxiety and depression    Worsened with recent work events.  Her heart racing and chest discomfort are related to panic attacks.  We will increase her Lexapro to 20 mg once daily.  We will give her a small amount of clonazepam to take for significant anxiety.  We will refer her for therapy.  She was advised to seek medical attention for any active suicidal thoughts or changing symptoms regarding her panic attacks.  Discussed symptoms that would indicate a cardiac event and she will seek medical attention if those occur..  Follow-up in 4 to 6 weeks.      Relevant Medications   escitalopram (LEXAPRO) 20 MG tablet   clonazePAM (KLONOPIN) 0.5 MG tablet   Other Relevant Orders   Ambulatory referral to Psychology   Other Visit Diagnoses     GAD (generalized anxiety disorder)       Relevant Medications   escitalopram (LEXAPRO) 20 MG tablet   clonazePAM (KLONOPIN) 0.5 MG tablet   Other Relevant Orders   Ambulatory referral to Psychology       Return in about 4 weeks (around 12/01/2020) for Anxiety and depression.  This visit occurred during the SARS-CoV-2 public health emergency.  Safety protocols were in place, including screening questions prior to the visit, additional usage of staff PPE, and extensive cleaning of exam room while observing appropriate contact time as indicated for disinfecting solutions.    [name], MD [place]

## 2020-11-18 ENCOUNTER — Telehealth: Payer: Self-pay | Admitting: Oncology

## 2020-11-18 NOTE — Telephone Encounter (Signed)
Pt called to reschedule Dec appt to Feb.2023. Please give her a call at [phone]

## 2020-11-25 ENCOUNTER — Other Ambulatory Visit: Payer: Self-pay | Admitting: Family Medicine

## 2020-11-25 DIAGNOSIS — I10 Essential (primary) hypertension: Secondary | ICD-10-CM

## 2020-11-29 ENCOUNTER — Ambulatory Visit: Payer: Self-pay

## 2020-12-09 ENCOUNTER — Other Ambulatory Visit: Payer: Self-pay | Admitting: Podiatry

## 2020-12-09 NOTE — Telephone Encounter (Signed)
Please advise 

## 2020-12-24 ENCOUNTER — Telehealth (INDEPENDENT_AMBULATORY_CARE_PROVIDER_SITE_OTHER): Payer: BC Managed Care – PPO | Admitting: Family Medicine

## 2020-12-24 ENCOUNTER — Encounter: Payer: Self-pay | Admitting: Family Medicine

## 2020-12-24 ENCOUNTER — Other Ambulatory Visit: Payer: Self-pay

## 2020-12-24 DIAGNOSIS — F32A Depression, unspecified: Secondary | ICD-10-CM

## 2020-12-24 DIAGNOSIS — D1803 Hemangioma of intra-abdominal structures: Secondary | ICD-10-CM | POA: Diagnosis not present

## 2020-12-24 DIAGNOSIS — F419 Anxiety disorder, unspecified: Secondary | ICD-10-CM | POA: Diagnosis not present

## 2020-12-24 NOTE — Assessment & Plan Note (Signed)
Much improved.  She will continue Lexapro 20 mg once daily.  She no longer will take the clonazepam.  She will see psychiatry as planned.

## 2020-12-24 NOTE — Progress Notes (Signed)
Virtual Visit via telephone Note  This visit type was conducted due to national recommendations for restrictions regarding the COVID-19 pandemic (e.g. social distancing).  This format is felt to be most appropriate for this patient at this time.  All issues noted in this document were discussed and addressed.  No physical exam was performed (except for noted visual exam findings with Video Visits).   I connected with Christie Ramirez today at 11:30 AM EST by telephone and verified that I am speaking with the correct person using two identifiers. Location patient: car Location provider: home office Persons participating in the virtual visit: patient, provider  I discussed the limitations, risks, security and privacy concerns of performing an evaluation and management service by telephone and the availability of in person appointments. I also discussed with the patient that there may be a patient responsible charge related to this service. The patient expressed understanding and agreed to proceed.  Interactive audio and video telecommunications were attempted between this provider and patient, however failed, due to patient having technical difficulties OR patient did not have access to video capability.  We continued and completed visit with audio only.   Reason for visit: f/u.    HPI: Anxiety/depression: Patient reports this has improved significantly.  She is on Lexapro 20 mg once daily.  She has not required any clonazepam since the beginning of November.  When she does wake up in a panic she does some breathing exercises which are beneficial.  She did not get into see a therapist given the cost.  She does have an appointment with psychiatry in January.  She notes no SI or HI.  She took a new job and notes that has helped significantly.  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  She wonders if any follow-up needs to be completed for this.   ROS: See pertinent positives and  negatives per HPI.  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    Cancer [facility]) 1995   thyroid   Endometriosis 2009   Fibula fracture 11/11/2015   GERD (gastroesophageal reflux disease)    Hyperlipidemia    Hypertension    Hypothyroidism    Osteopenia    rt hip   Personal history of radiation therapy 2015   mammosite   Seasonal allergies    Sleep apnea    does not wear CPAP    Past Surgical History:  Procedure Laterality Date   ABDOMINAL HYSTERECTOMY  2005   Martin DeFrancesco,MD   BREAST BIOPSY Right 04-02-13   INVASIVE MAMMARY CARCINOMA WITH FEATURES OF TUBULAR CARCINOMA,   BREAST CYST EXCISION Left 12/02/2018   Procedure: EXCISION LEFT BREAST SEBACEOUS CYST;  Surgeon: Jovita Kussmaul, MD;  Location: [facility];  Service: General;  Laterality: Left;   BREAST LUMPECTOMY Right 04/2013   INVASIVE MAMMARY CARCINOMA WITH FEATURES OF TUBULAR CARCINOMA,   BREAST SURGERY Right 05/01/13   wide excision   colectomy  2009   secondary to endometriosis DR. Tamala Julian   COLON SURGERY  December 2008   right hemicolectomy for cecal mass identified as endometrioma. No malignancy.   COLONOSCOPY  2009   Dr. Tamala Julian   COLONOSCOPY WITH PROPOFOL N/A 11/19/2018   Procedure: COLONOSCOPY WITH PROPOFOL;  Surgeon: Lucilla Lame, MD;  Location: [facility] END[facility];  Service: Endoscopy;  Laterality: N/A;   FOOT SURGERY  right   MASTOPEXY Bilateral 01/03/2019   Procedure: MASTOPEXY;  Surgeon: Irene Limbo, MD;  Location: [facility];  Service: Plastics;  Laterality: Bilateral;    Family History  Problem Relation Age of Onset   Colon polyps Father        age 63   Heart disease Father    Bone cancer Father    Heart disease Mother    Rheumatic fever Mother    Heart disease Paternal Grandmother     Heart disease Paternal Grandfather    Cancer Neg Hx    Diabetes Neg Hx    Breast cancer Neg Hx     SOCIAL HX: Non-smoker   Current Outpatient Medications:    albuterol (VENTOLIN HFA) 108 (90 Base) MCG/ACT inhaler, Inhale 2 puffs into the lungs every 6 (six) hours as needed for wheezing or shortness of breath., Disp: 1 each, Rfl: 1   amLODipine (NORVASC) 5 MG tablet, Take 2 tablets (10 mg total) by mouth daily., Disp: 180 tablet, Rfl: 2   aspirin 81 MG tablet, Take 81 mg by mouth daily., Disp: , Rfl:    atorvastatin (LIPITOR) 40 MG tablet, TAKE ONE TABLET BY MOUTH DAILY, Disp: 90 tablet, Rfl: 0   atorvastatin (LIPITOR) 80 MG tablet, Take by mouth., Disp: , Rfl:    CALCIUM-MAGNESIUM-ZINC PO, Take by mouth 3 (three) times daily., Disp: , Rfl:    cetirizine (ZYRTEC) 10 MG tablet, SMARTSIG:1 Pill By Mouth Daily PRN, Disp: , Rfl:    Cholecalciferol (VITAMIN D-3 PO), Take 1,000 Units by mouth., Disp: , Rfl:    clonazePAM (KLONOPIN) 0.5 MG tablet, Take 1 tablet (0.5 mg total) by mouth 2 (two) times daily as needed for anxiety., Disp: 20 tablet, Rfl: 0   cyclobenzaprine (FLEXERIL) 10 MG tablet, Take 1 tablet (10 mg total) by mouth 3 (three) times daily as needed for muscle spasms., Disp: 30 tablet, Rfl: 0   escitalopram (LEXAPRO) 20 MG tablet, Take 1 tablet (20 mg total) by mouth daily., Disp: 90 tablet, Rfl: 1   ezetimibe (ZETIA) 10 MG tablet, TAKE ONE TABLET BY MOUTH DAILY, Disp: 90 tablet, Rfl: 1   fluticasone (FLONASE) 50 MCG/ACT nasal spray, Place 2 sprays into both nostrils daily. , Disp: , Rfl:    gabapentin (NEURONTIN) 100 MG capsule, TAKE ONE CAPSULE BY MOUTH THREE TIMES A DAY, Disp: 90 capsule, Rfl: 1   GARLIC PO, Take 710 mg by mouth., Disp: , Rfl:    levothyroxine (SYNTHROID) 137 MCG tablet, Take 1 tablet (137 mcg total) by mouth daily before breakfast., Disp: 90 tablet, Rfl: 1   lidocaine (LIDODERM) 5 %, Place 1 patch onto the skin every 12 (twelve) hours. Remove & Discard patch  within 12 hours or as directed by MD, Disp: 10 patch, Rfl: 0   losartan (COZAAR) 100 MG tablet, TAKE ONE TABLET BY MOUTH DAILY, Disp: 90 tablet, Rfl: 1   Multiple Vitamins-Minerals (MULTIVITAMIN WITH MINERALS) tablet, Take 1 tablet by mouth daily., Disp: , Rfl:    ondansetron (ZOFRAN) 4 MG tablet, Take 1 tablet (4 mg total) by mouth every 8 (eight) hours as needed for up to 10 doses for nausea or vomiting., Disp: 10 tablet, Rfl: 0   predniSONE (DELTASONE) 10 MG tablet, Take 6 tablets ( 60 mg total) on day 1 and take 5 tablets (50 mg total) on day 2, and decrease by 1 tablet (10 mg) every day until off in 6 days.,  Disp: 21 tablet, Rfl: 0   traZODone (DESYREL) 50 MG tablet, TAKE TWO TABLETS BY MOUTH ONCE NIGHTLY AT BEDTIME AS NEEDED FOR SLEEP, Disp: 180 tablet, Rfl: 1   vitamin C (ASCORBIC ACID) 500 MG tablet, Take 500 mg by mouth daily., Disp: , Rfl:   Current Facility-Administered Medications:    betamethasone acetate-betamethasone sodium phosphate (CELESTONE) injection 3 mg, 3 mg, Intra-articular, Once, Amalia Hailey, Dorathy Daft, DPM  EXAM: This was a telephone visit and thus no exam was completed.   ASSESSMENT AND PLAN:  Discussed the following assessment and plan:  Problem List Items Addressed This Visit     Anxiety and depression    Much improved.  She will continue Lexapro 20 mg once daily.  She no longer will take the clonazepam.  She will see psychiatry as planned.      Hepatic hemangioma    This has been noted on serial imaging test.  Most recently it appears to have gotten smaller on recent ultrasound.  Discussed that generally no specific follow-up would be needed though she was advised to contact GI to get their input on the need for follow-up for this finding.       Return in about 6 months (around 06/24/2021) for Anxiety/depression.   I discussed the assessment and treatment plan with the patient. The patient was provided an opportunity to ask questions and all were answered. The  patient agreed with the plan and demonstrated an understanding of the instructions.   The patient was advised to call back or seek an in-person evaluation if the symptoms worsen or if the condition fails to improve as anticipated.  I provided 11 minutes of non-face-to-face time during this encounter.   [name], MD

## 2020-12-24 NOTE — Assessment & Plan Note (Signed)
This has been noted on serial imaging test.  Most recently it appears to have gotten smaller on recent ultrasound.  Discussed that generally no specific follow-up would be needed though she was advised to contact GI to get their input on the need for follow-up for this finding.

## 2021-01-07 ENCOUNTER — Other Ambulatory Visit: Payer: BC Managed Care – PPO

## 2021-01-07 ENCOUNTER — Ambulatory Visit: Payer: BC Managed Care – PPO | Admitting: Oncology

## 2021-01-27 DIAGNOSIS — G4700 Insomnia, unspecified: Secondary | ICD-10-CM | POA: Diagnosis not present

## 2021-01-27 DIAGNOSIS — F4322 Adjustment disorder with anxiety: Secondary | ICD-10-CM | POA: Diagnosis not present

## 2021-01-27 DIAGNOSIS — F410 Panic disorder [episodic paroxysmal anxiety] without agoraphobia: Secondary | ICD-10-CM | POA: Diagnosis not present

## 2021-02-03 ENCOUNTER — Other Ambulatory Visit: Payer: Self-pay | Admitting: Family Medicine

## 2021-02-03 DIAGNOSIS — E785 Hyperlipidemia, unspecified: Secondary | ICD-10-CM

## 2021-03-01 ENCOUNTER — Ambulatory Visit
Admission: RE | Admit: 2021-03-01 | Discharge: 2021-03-01 | Disposition: A | Discharge status: Home / Self Care | Payer: BC Managed Care – PPO | Source: Ambulatory Visit | Attending: Oncology | Admitting: Oncology

## 2021-03-01 ENCOUNTER — Other Ambulatory Visit: Payer: Self-pay

## 2021-03-01 DIAGNOSIS — M8580 Other specified disorders of bone density and structure, unspecified site: Secondary | ICD-10-CM | POA: Insufficient documentation

## 2021-03-01 DIAGNOSIS — Z853 Personal history of malignant neoplasm of breast: Secondary | ICD-10-CM | POA: Insufficient documentation

## 2021-03-01 DIAGNOSIS — Z1231 Encounter for screening mammogram for malignant neoplasm of breast: Secondary | ICD-10-CM | POA: Insufficient documentation

## 2021-03-09 ENCOUNTER — Other Ambulatory Visit: Payer: Self-pay | Admitting: *Deleted

## 2021-03-09 DIAGNOSIS — Z853 Personal history of malignant neoplasm of breast: Secondary | ICD-10-CM

## 2021-03-09 DIAGNOSIS — M8580 Other specified disorders of bone density and structure, unspecified site: Secondary | ICD-10-CM

## 2021-03-10 DIAGNOSIS — G4733 Obstructive sleep apnea (adult) (pediatric): Secondary | ICD-10-CM | POA: Diagnosis not present

## 2021-03-10 DIAGNOSIS — I10 Essential (primary) hypertension: Secondary | ICD-10-CM | POA: Diagnosis not present

## 2021-03-10 DIAGNOSIS — I6523 Occlusion and stenosis of bilateral carotid arteries: Secondary | ICD-10-CM | POA: Diagnosis not present

## 2021-03-10 DIAGNOSIS — E785 Hyperlipidemia, unspecified: Secondary | ICD-10-CM | POA: Diagnosis not present

## 2021-03-16 DIAGNOSIS — F4322 Adjustment disorder with anxiety: Secondary | ICD-10-CM | POA: Diagnosis not present

## 2021-03-16 DIAGNOSIS — F410 Panic disorder [episodic paroxysmal anxiety] without agoraphobia: Secondary | ICD-10-CM | POA: Diagnosis not present

## 2021-03-17 ENCOUNTER — Inpatient Hospital Stay: Payer: BC Managed Care – PPO | Attending: Oncology | Admitting: Oncology

## 2021-03-17 ENCOUNTER — Encounter: Payer: Self-pay | Admitting: Oncology

## 2021-03-17 ENCOUNTER — Inpatient Hospital Stay: Payer: BC Managed Care – PPO

## 2021-03-17 ENCOUNTER — Other Ambulatory Visit: Payer: Self-pay

## 2021-03-17 VITALS — BP 132/84 | HR 82 | Temp 98.7°F | Resp 19 | Wt 155.2 lb

## 2021-03-17 DIAGNOSIS — Z808 Family history of malignant neoplasm of other organs or systems: Secondary | ICD-10-CM | POA: Insufficient documentation

## 2021-03-17 DIAGNOSIS — I10 Essential (primary) hypertension: Secondary | ICD-10-CM | POA: Insufficient documentation

## 2021-03-17 DIAGNOSIS — Z853 Personal history of malignant neoplasm of breast: Secondary | ICD-10-CM | POA: Diagnosis not present

## 2021-03-17 DIAGNOSIS — R7401 Elevation of levels of liver transaminase levels: Secondary | ICD-10-CM | POA: Diagnosis not present

## 2021-03-17 DIAGNOSIS — Z90710 Acquired absence of both cervix and uterus: Secondary | ICD-10-CM | POA: Diagnosis not present

## 2021-03-17 DIAGNOSIS — M8580 Other specified disorders of bone density and structure, unspecified site: Secondary | ICD-10-CM

## 2021-03-17 DIAGNOSIS — F418 Other specified anxiety disorders: Secondary | ICD-10-CM | POA: Diagnosis not present

## 2021-03-17 LAB — COMPREHENSIVE METABOLIC PANEL
ALT: 89 U/L — ABNORMAL HIGH (ref 0–44)
AST: 34 U/L (ref 15–41)
Albumin: 4.5 g/dL (ref 3.5–5.0)
Alkaline Phosphatase: 94 U/L (ref 38–126)
Anion gap: 6 (ref 5–15)
BUN: 19 mg/dL (ref 8–23)
CO2: 28 mmol/L (ref 22–32)
Calcium: 9.9 mg/dL (ref 8.9–10.3)
Chloride: 102 mmol/L (ref 98–111)
Creatinine, Ser: 0.64 mg/dL (ref 0.44–1.00)
GFR, Estimated: >60 mL/min (ref >60)
Glucose, Bld: 100 mg/dL — ABNORMAL HIGH (ref 70–99)
Potassium: 4.4 mmol/L (ref 3.5–5.1)
Sodium: 136 mmol/L (ref 135–145)
Total Bilirubin: 0.5 mg/dL (ref 0.3–1.2)
Total Protein: 7.6 g/dL (ref 6.5–8.1)

## 2021-03-17 LAB — CBC WITH DIFFERENTIAL/PLATELET
Abs Immature Granulocytes: 0.03 10*3/uL (ref 0.00–0.07)
Basophils Absolute: 0.1 10*3/uL (ref 0.0–0.1)
Basophils Relative: 1 %
Eosinophils Absolute: 0.2 10*3/uL (ref 0.0–0.5)
Eosinophils Relative: 2 %
HCT: 39.5 % (ref 36.0–46.0)
Hemoglobin: 12.8 g/dL (ref 12.0–15.0)
Immature Granulocytes: 0 %
Lymphocytes Relative: 44 %
Lymphs Abs: 3.9 10*3/uL (ref 0.7–4.0)
MCH: 28.3 pg (ref 26.0–34.0)
MCHC: 32.4 g/dL (ref 30.0–36.0)
MCV: 87.4 fL (ref 80.0–100.0)
Monocytes Absolute: 0.6 10*3/uL (ref 0.1–1.0)
Monocytes Relative: 6 %
Neutro Abs: 4.2 10*3/uL (ref 1.7–7.7)
Neutrophils Relative %: 47 %
Platelets: 323 10*3/uL (ref 150–400)
RBC: 4.52 MIL/uL (ref 3.87–5.11)
RDW: 13.9 % (ref 11.5–15.5)
WBC: 9 10*3/uL (ref 4.0–10.5)
nRBC: 0 % (ref 0.0–0.2)

## 2021-03-17 NOTE — Progress Notes (Signed)
Hematology/Oncology follow up note [facility] Telephone:([phone]) [phone] Fax:(336) [phone]   Patient Care Team: Leone Haven, MD as PCP - General (Family Medicine) Bary Castilla, Forest Gleason, MD (General Surgery) Jackolyn Confer, MD (Internal Medicine) Leone Haven, MD (Family Medicine) Earlie Server, MD as Consulting Physician (Hematology and Oncology)  REFERRING PROVIDER: Leone Haven, MD  CHIEF COMPLAINTS/REASON FOR VISIT:  Follow up for  history of breast cancer.  HISTORY OF PRESENTING ILLNESS:   Christie Ramirez is a  66 y.o.  female with PMH listed below was seen in consultation at the request of  Leone Haven, MD  for evaluation of history of breast cancer. Patient previously was seen by Dr. Jeb Levering for 1 time.  She wants to reestablish care. Patient was recently seen by Dr. Marlou Starks as she felt a small mass in the upper inner left breast.  The size starts to grow and gets bigger. Extensive medical records reviewed was performed by me. Patient has a history of pT1a pN0 right breast cancer, diagnosed in April 2015, ER/PR positive, HER-2 negative, grade 1, DCIS present, margins negative.  Status post lumpectomy,adjuvant radiation.  Patient was recommended by Dr. Oliva Bustard to start Letrozole. She tried for very short period of time and self stopped.  Per patient she was discharged from cancer center as she was not taking her medication. Patient follows up with her OB/GYN Dr. Enzo Bi, [facility] physician and was prescribed on Raloxifen 1m daily. Per records, she was started on Raloxifen in Feb 2018,   Tolerates well.  Denies any personal history of blood clots. She has history of hysterectomy. Her OB/GYN doctor recently retired.  She wants to establish care with oncology for further management of stage I Patient has anxiety and depression.  She comes to the clinic with her service dog.   INTERVAL HISTORY Christie Ramirez a 66y.o. female who has  above history reviewed by me today presents for follow up visit for breast cancer Patient reports feeling well. Denies any new complaints.   Review of Systems  Constitutional:  Negative for appetite change, chills, fatigue and fever.  HENT:   Negative for hearing loss and voice change.   Eyes:  Negative for eye problems.  Respiratory:  Negative for chest tightness and cough.   Cardiovascular:  Negative for chest pain.  Gastrointestinal:  Negative for abdominal distention, abdominal pain and blood in stool.  Endocrine: Negative for hot flashes.  Genitourinary:  Negative for difficulty urinating and frequency.   Musculoskeletal:  Negative for arthralgias.  Skin:  Negative for itching and rash.  Neurological:  Negative for extremity weakness.  Hematological:  Negative for adenopathy.  Psychiatric/Behavioral:  Negative for confusion.    MEDICAL HISTORY:  Past Medical History:  Diagnosis Date   Anxiety    [place]ast [place]    Breast cancer of upper-inner quadrant of right female breast ([facility] 05/01/2013   Tubular carcinoma, T1b,N0,M0; ER/ PR 90%, her 2 neu not overexpressed.    Cancer [place] [place]   thyroid   Endometriosis 2009   Fibula fracture 11/11/2015   GERD (gastroesophageal reflux disease)    Hyperlipidemia    Hypertension    Hypothyroidism    Osteopenia    rt hip   Personal history of radiation therapy 2015   mammosite   Seasonal allergies    Sleep apnea    does not wear CPAP    SURGICAL HISTORY: Past Surgical History:  Procedure Laterality Date   ABDOMINAL HYSTERECTOMY  2005  Hassell Done DeFrancesco,MD   BREAST BIOPSY Right 04-02-13   INVASIVE MAMMARY CARCINOMA WITH FEATURES OF TUBULAR CARCINOMA,   BREAST CYST EXCISION Left 12/02/2018   Procedure: EXCISION LEFT BREAST SEBACEOUS CYST;  Surgeon: Jovita Kussmaul, MD;  Location: [place];  Service: General;  Laterality: Left;   BREAST LUMPECTOMY Right 04/2013   INVASIVE MAMMARY CARCINOMA WITH FEATURES OF  TUBULAR CARCINOMA,   BREAST SURGERY Right 05/01/13   wide excision   colectomy  2009   secondary to endometriosis DR. Tamala Julian   COLON SURGERY  December 2008   right hemicolectomy for cecal mass identified as endometrioma. No malignancy.   COLONOSCOPY  2009   Dr. Tamala Julian   COLONOSCOPY WITH PROPOFOL N/A 11/19/2018   Procedure: COLONOSCOPY WITH PROPOFOL;  Surgeon: Lucilla Lame, MD;  Location: [facility] ENDOSCOPY;  Service: Endoscopy;  Laterality: N/A;   FOOT SURGERY  right   MASTOPEXY Bilateral 01/03/2019   Procedure: MASTOPEXY;  Surgeon: Irene Limbo, MD;  Location: [place];  Service: Plastics;  Laterality: Bilateral;    SOCIAL HISTORY: Social History   Socioeconomic History   Marital status: Married    Spouse name: Not on file   Number of children: Not on file   Years of education: Not on file   Highest education level: Not on file  Occupational History   Not on file  Tobacco Use   Smoking status: Never   Smokeless tobacco: Never  Vaping Use   Vaping Use: Never used  Substance and Sexual Activity   Alcohol use: Not Currently    Alcohol/week: 0.0 standard drinks    Comment: wine socially   Drug use: No   Sexual activity: Yes    Birth control/protection: Surgical  Other Topics Concern   Not on file  Social History Narrative   Married   Likes to bake [employer]   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, quantity and offered job [employer] but moved to [place] to take job at [employer] which she lost 12/09/2018 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 as she likes to be kind to people    Social Determinants of Radio broadcast assistant Strain: Not on file  Food Insecurity: Not on file  Transportation Needs: Not on file  Physical Activity: Not on file  Stress: Not on file  Social Connections: Not on file  Intimate Partner Violence: Not on file    FAMILY HISTORY: Family History  Problem Relation Age of Onset   Colon polyps Father        age  5   Heart disease Father    Bone cancer Father    Heart disease Mother    Rheumatic fever Mother    Heart disease Paternal Grandmother    Heart disease Paternal Grandfather    Cancer Neg Hx    Diabetes Neg Hx    Breast cancer Neg Hx     ALLERGIES:  is allergic to codeine, codeine, iodinated contrast media, iodine, iodine, penicillins, and penicillins.  MEDICATIONS:  Current Outpatient Medications  Medication Sig Dispense Refill   albuterol (VENTOLIN HFA) 108 (90 Base) MCG/ACT inhaler Inhale 2 puffs into the lungs every 6 (six) hours as needed for wheezing or shortness of breath. 1 each 1   amLODipine (NORVASC) 5 MG tablet Take 2 tablets (10 mg total) by mouth daily. 180 tablet 2   aspirin 81 MG tablet Take 81 mg by mouth daily.     atorvastatin (LIPITOR) 40 MG tablet TAKE ONE  TABLET BY MOUTH DAILY 90 tablet 0   CALCIUM-MAGNESIUM-ZINC PO Take by mouth 3 (three) times daily.     cetirizine (ZYRTEC) 10 MG tablet SMARTSIG:1 Pill By Mouth Daily PRN     Cholecalciferol (VITAMIN D-3 PO) Take 1,000 Units by mouth.     escitalopram (LEXAPRO) 20 MG tablet Take 1 tablet (20 mg total) by mouth daily. 90 tablet 1   ezetimibe (ZETIA) 10 MG tablet TAKE ONE TABLET BY MOUTH DAILY 90 tablet 1   fluticasone (FLONASE) 50 MCG/ACT nasal spray Place 2 sprays into both nostrils daily.      gabapentin (NEURONTIN) 100 MG capsule TAKE ONE CAPSULE BY MOUTH THREE TIMES A DAY 90 capsule 1   levothyroxine (SYNTHROID) 137 MCG tablet Take 1 tablet (137 mcg total) by mouth daily before breakfast. 90 tablet 1   losartan (COZAAR) 100 MG tablet TAKE ONE TABLET BY MOUTH DAILY 90 tablet 1   Multiple Vitamins-Minerals (MULTIVITAMIN WITH MINERALS) tablet Take 1 tablet by mouth daily.     traZODone (DESYREL) 50 MG tablet TAKE TWO TABLETS BY MOUTH ONCE NIGHTLY AT BEDTIME AS NEEDED FOR SLEEP 180 tablet 1   vitamin C (ASCORBIC ACID) 500 MG tablet Take 500 mg by mouth daily.     clonazePAM (KLONOPIN) 0.5 MG tablet Take 1 tablet  (0.5 mg total) by mouth 2 (two) times daily as needed for anxiety. (Patient not taking: Reported on 03/17/2021) 20 tablet 0   cyclobenzaprine (FLEXERIL) 10 MG tablet Take 1 tablet (10 mg total) by mouth 3 (three) times daily as needed for muscle spasms. (Patient not taking: Reported on 03/17/2021) 30 tablet 0   GARLIC PO Take 156 mg by mouth. (Patient not taking: Reported on 03/17/2021)     lidocaine (LIDODERM) 5 % Place 1 patch onto the skin every 12 (twelve) hours. Remove & Discard patch within 12 hours or as directed by MD (Patient not taking: Reported on 03/17/2021) 10 patch 0   ondansetron (ZOFRAN) 4 MG tablet Take 1 tablet (4 mg total) by mouth every 8 (eight) hours as needed for up to 10 doses for nausea or vomiting. (Patient not taking: Reported on 03/17/2021) 10 tablet 0   predniSONE (DELTASONE) 10 MG tablet Take 6 tablets ( 60 mg total) on day 1 and take 5 tablets (50 mg total) on day 2, and decrease by 1 tablet (10 mg) every day until off in 6 days. (Patient not taking: Reported on 03/17/2021) 21 tablet 0   Current Facility-Administered Medications  Medication Dose Route Frequency Provider Last Rate Last Admin   betamethasone acetate-betamethasone sodium phosphate (CELESTONE) injection 3 mg  3 mg Intra-articular Once Edrick Kins, DPM         PHYSICAL EXAMINATION: ECOG PERFORMANCE STATUS: 0 - Asymptomatic Vitals:   03/17/21 1446  BP: 132/84  Pulse: 82  Resp: 19  Temp: 98.7 F (37.1 C)  SpO2: 97%   Filed Weights   03/17/21 1446  Weight: 155 lb 3.2 oz (70.4 kg)    Physical Exam Constitutional:      General: She is not in acute distress. HENT:     Head: Normocephalic and atraumatic.  Eyes:     General: No scleral icterus.    Pupils: Pupils are equal, round, and reactive to light.  Cardiovascular:     Rate and Rhythm: Normal rate and regular rhythm.     Heart sounds: Normal heart sounds.  Pulmonary:     Effort: Pulmonary effort is normal. No respiratory distress.  Breath sounds: No wheezing.  Abdominal:     General: Bowel sounds are normal. There is no distension.     Palpations: Abdomen is soft. There is no mass.     Tenderness: There is no abdominal tenderness.  Musculoskeletal:        General: No deformity. Normal range of motion.     Cervical back: Normal range of motion and neck supple.  Skin:    General: Skin is warm and dry.     Findings: No erythema or rash.  Neurological:     Mental Status: She is alert and oriented to person, place, and time.     Cranial Nerves: No cranial nerve deficit.     Coordination: Coordination normal.  Psychiatric:        Behavior: Behavior normal.        Thought Content: Thought content normal.   Breast exam was performed in seated and lying down position. Patient is status post right lumpectomy with a well-healed surgical scar, 11-12 o'clock tissue thickening nodule.  No palpable breast masses bilaterally.  No palpable axillary lymphadenopathy.   LABORATORY DATA:  I have reviewed the data as listed Lab Results  Component Value Date   WBC 9.0 03/17/2021   HGB 12.8 03/17/2021   HCT 39.5 03/17/2021   MCV 87.4 03/17/2021   PLT 323 03/17/2021   Recent Labs    05/06/20 0937 07/21/20 0758 07/25/20 2150 03/17/21 1432  NA 140  --  137 136  K 4.5  --  3.9 4.4  CL 103  --  103 102  CO2 27  --  26 28  GLUCOSE 110*  --  115* 100*  BUN 19  --  16 19  CREATININE 0.66  --  0.56 0.64  CALCIUM 9.6  --  9.1 9.9  GFRNONAA >60  --  >60 >60  PROT 7.4 6.3  --  7.6  ALBUMIN 4.6 4.1  --  4.5  AST 33 42*  --  34  ALT 72* 87*  --  89*  ALKPHOS 98 89  --  94  BILITOT 1.1 0.6  --  0.5  BILIDIR  --  0.1  --   --     Iron/TIBC/Ferritin/ %Sat No results found for: IRON, TIBC, FERRITIN, IRONPCTSAT    RADIOGRAPHIC STUDIES: I have personally reviewed the radiological images as listed and agreed with the findings in the report.  MM 3D SCREEN BREAST BILATERAL  Result Date: 03/02/2021 CLINICAL DATA:  Screening.  EXAM: DIGITAL SCREENING BILATERAL MAMMOGRAM WITH TOMOSYNTHESIS AND CAD TECHNIQUE: Bilateral screening digital craniocaudal and mediolateral oblique mammograms were obtained. Bilateral screening digital breast tomosynthesis was performed. The images were evaluated with computer-aided detection. COMPARISON:  Previous exam(s). ACR Breast Density Category c: The breast tissue is heterogeneously dense, which may obscure small masses. FINDINGS: There are no findings suspicious for malignancy. IMPRESSION: No mammographic evidence of malignancy. A result letter of this screening mammogram will be mailed directly to the patient. RECOMMENDATION: Screening mammogram in one year. (Code:SM-B-01Y) BI-RADS CATEGORY  1: Negative. Electronically Signed   By: Margarette Canada M.D.   On: 03/02/2021 10:18     ASSESSMENT & PLAN:  1. History of breast cancer   2. Osteopenia, unspecified location   3. Elevated ALT measurement    #History of stage I pT1a pN0 right breast cancer, diagnosed in April 2015, ER/PR positive, HER-2 negative, Not able to tolerate aromatase inhibitor.  Currently not on any antiestrogen treatments. It has been 8 years since  initial diagnosis.  Patient is doing well clinically.  Recommend annual screening mammogram, due in February 2024.  Elevated ALT, chronic.  Likely secondary to cholesterol medication.  Ultrasound showed hemangioma.   Osteopenia.  06/14/2020 bone density showed osteopenia, slightly improved..  Recommend calcium and vitamin D supplementation. Orders Placed This Encounter  Procedures   MM 3D SCREEN BREAST BILATERAL    Standing Status:   Future    Standing Expiration Date:   03/17/2022    Order Specific Question:   Reason for Exam (SYMPTOM  OR DIAGNOSIS REQUIRED)    Answer:   history of breast cancer    Order Specific Question:   Preferred imaging location?    Answer:   [facility]   CBC with Differential/Platelet    Standing Status:   Future    Standing Expiration Date:    03/17/2022   Comprehensive metabolic panel    Standing Status:   Future    Standing Expiration Date:   03/17/2022    All questions were answered. The patient knows to call the clinic with any problems questions or concerns.  cc Leone Haven, MD    Return of visit: 1 year Thank you for this kind referral and the opportunity to participate in the care of this patient. A copy of today's note is routed to referring provider   Earlie Server, MD, PhD Hematology Oncology  03/17/2021

## 2021-03-21 ENCOUNTER — Ambulatory Visit: Payer: BC Managed Care – PPO | Admitting: Dermatology

## 2021-03-21 ENCOUNTER — Other Ambulatory Visit: Payer: Self-pay

## 2021-03-21 DIAGNOSIS — D1800 Hemangioma unspecified site: Secondary | ICD-10-CM | POA: Diagnosis not present

## 2021-03-21 DIAGNOSIS — L814 Other melanin hyperpigmentation: Secondary | ICD-10-CM

## 2021-03-21 DIAGNOSIS — D229 Melanocytic nevi, unspecified: Secondary | ICD-10-CM | POA: Diagnosis not present

## 2021-03-21 DIAGNOSIS — Z1283 Encounter for screening for malignant neoplasm of skin: Secondary | ICD-10-CM

## 2021-03-21 DIAGNOSIS — L820 Inflamed seborrheic keratosis: Secondary | ICD-10-CM

## 2021-03-21 DIAGNOSIS — L578 Other skin changes due to chronic exposure to nonionizing radiation: Secondary | ICD-10-CM

## 2021-03-21 DIAGNOSIS — L821 Other seborrheic keratosis: Secondary | ICD-10-CM

## 2021-03-21 DIAGNOSIS — I8393 Asymptomatic varicose veins of bilateral lower extremities: Secondary | ICD-10-CM

## 2021-03-21 DIAGNOSIS — Z853 Personal history of malignant neoplasm of breast: Secondary | ICD-10-CM

## 2021-03-21 NOTE — Progress Notes (Signed)
Follow-Up Visit   Subjective  Christie Ramirez is a 66 y.o. female who presents for the following: Follow-up (Patient there today for tbse. Patient reports a mole at back and spot at left cheek she would like checked. ). The patient presents for Total-Body Skin Exam (TBSE) for skin cancer screening and mole check.  The patient has spots, moles and lesions to be evaluated, some may be new or changing and the patient has concerns that these could be cancer.  The following portions of the chart were reviewed this encounter and updated as appropriate:  Tobacco   Allergies   Meds   Problems   Med Hx   Surg Hx   Fam Hx      Review of Systems: No other skin or systemic complaints except as noted in HPI or Assessment and Plan.  Objective  Well appearing patient in no apparent distress; mood and affect are within normal limits.  A full examination was performed including scalp, head, eyes, ears, nose, lips, neck, chest, axillae, abdomen, back, buttocks, bilateral upper extremities, bilateral lower extremities, hands, feet, fingers, toes, fingernails, and toenails. All findings within normal limits unless otherwise noted below.  right cheek x 1, left cheek x 1 (2) Erythematous stuck-on, waxy papule or plaque   Assessment & Plan  Inflamed seborrheic keratosis (2) right cheek x 1, left cheek x 1 Irritated and bothers patient   Destruction of lesion - right cheek x 1, left cheek x 1 Complexity: simple   Destruction method: cryotherapy   Informed consent: discussed and consent obtained   Timeout:  patient name, date of birth, surgical site, and procedure verified Lesion destroyed using liquid nitrogen: Yes   Region frozen until ice ball extended beyond lesion: Yes   Outcome: patient tolerated procedure well with no complications   Post-procedure details: wound care instructions given   Additional details:  Prior to procedure, discussed risks of blister formation, small wound, skin  dyspigmentation, or rare scar following cryotherapy. Recommend Vaseline ointment to treated areas while healing.  Skin cancer screening  Lentigines - Scattered tan macules - Due to sun exposure - Benign-appearing, observe - Recommend daily broad spectrum sunscreen SPF 30+ to sun-exposed areas, reapply every 2 hours as needed. - Call for any changes  Seborrheic Keratoses - Stuck-on, waxy, tan-brown papules and/or plaques  - Benign-appearing - Discussed benign etiology and prognosis. - Observe - Call for any changes  Melanocytic Nevi - Tan-brown and/or pink-flesh-colored symmetric macules and papules - Benign appearing on exam today - Observation - Call clinic for new or changing moles - Recommend daily use of broad spectrum spf 30+ sunscreen to sun-exposed areas.   Hemangiomas - Red papules - Discussed benign nature - Observe - Call for any changes  Varicose Veins/Spider Veins - Dilated blue, purple or red veins at the lower extremities - Reassured - Smaller vessels can be treated by sclerotherapy (a procedure to inject a medicine into the veins to make them disappear) if desired, but the treatment is not covered by insurance. Larger vessels may be covered if symptomatic and we would refer to vascular surgeon if treatment desired.  Hx of breast cancer Right Breast  Observe No lymphadenopathy.   Actinic Damage - Chronic condition, secondary to cumulative UV/sun exposure - diffuse scaly erythematous macules with underlying dyspigmentation - Recommend daily broad spectrum sunscreen SPF 30+ to sun-exposed areas, reapply every 2 hours as needed.  - Staying in the shade or wearing long sleeves, sun glasses (UVA+UVB protection) and  wide brim hats (4-inch brim around the entire circumference of the hat) are also recommended for sun protection.  - Call for new or changing lesions.  Skin cancer screening performed today.  Return for 1 year tbse . IRuthell Rummage, CMA, am  acting as scribe for Sarina Ser, MD. Documentation: I have reviewed the above documentation for accuracy and completeness, and I agree with the above.  Sarina Ser, MD

## 2021-03-21 NOTE — Patient Instructions (Addendum)
Cryotherapy Aftercare  Wash gently with soap and water everyday.   Apply Vaseline and Band-Aid daily until healed.     Seborrheic Keratosis  What causes seborrheic keratoses? Seborrheic keratoses are harmless, common skin growths that first appear during adult life.  As time goes by, more growths appear.  Some people may develop a large number of them.  Seborrheic keratoses appear on both covered and uncovered body parts.  They are not caused by sunlight.  The tendency to develop seborrheic keratoses can be inherited.  They vary in color from skin-colored to gray, brown, or even black.  They can be either smooth or have a rough, warty surface.   Seborrheic keratoses are superficial and look as if they were stuck on the skin.  Under the microscope this type of keratosis looks like layers upon layers of skin.  That is why at times the top layer may seem to fall off, but the rest of the growth remains and re-grows.    Treatment Seborrheic keratoses do not need to be treated, but can easily be removed in the office.  Seborrheic keratoses often cause symptoms when they rub on clothing or jewelry.  Lesions can be in the way of shaving.  If they become inflamed, they can cause itching, soreness, or burning.  Removal of a seborrheic keratosis can be accomplished by freezing, burning, or surgery. If any spot bleeds, scabs, or grows rapidly, please return to have it checked, as these can be an indication of a skin cancer.    Melanoma ABCDEs  Melanoma is the most dangerous type of skin cancer, and is the leading cause of death from skin disease.  You are more likely to develop melanoma if you: Have light-colored skin, light-colored eyes, or red or blond hair Spend a lot of time in the sun Tan regularly, either outdoors or in a tanning bed Have had blistering sunburns, especially during childhood Have a close family member who has had a melanoma Have atypical moles or large birthmarks  Early  detection of melanoma is key since treatment is typically straightforward and cure rates are extremely high if we catch it early.   The first sign of melanoma is often a change in a mole or a new dark spot.  The ABCDE system is a way of remembering the signs of melanoma.  A for asymmetry:  The two halves do not match. B for border:  The edges of the growth are irregular. C for color:  A mixture of colors are present instead of an even brown color. D for diameter:  Melanomas are usually (but not always) greater than 58mm - the size of a pencil eraser. E for evolution:  The spot keeps changing in size, shape, and color.  Please check your skin once per month between visits. You can use a small mirror in front and a large mirror behind you to keep an eye on the back side or your body.   If you see any new or changing lesions before your next follow-up, please call to schedule a visit.  Please continue daily skin protection including broad spectrum sunscreen SPF 30+ to sun-exposed areas, reapplying every 2 hours as needed when you're outdoors.   Staying in the shade or wearing long sleeves, sun glasses (UVA+UVB protection) and wide brim hats (4-inch brim around the entire circumference of the hat) are also recommended for sun protection.     If You Need Anything After Your Visit  If you have any  questions or concerns for your doctor, please call our main line at [phone] and press option 4 to reach your doctor's medical assistant. If no one answers, please leave a voicemail as directed and we will return your call as soon as possible. Messages left after 4 pm will be answered the following business day.   You may also send [place] a message via [place]. We typically respond to MyChart messages within 1-2 business days.  For prescription refills, please ask your pharmacy to contact our office. Our fax number is [phone].  If you have an urgent issue when the clinic is closed that cannot wait  until the next business day, you can page your doctor at the number below.    Please note that while we do our best to be available for urgent issues outside of office hours, we are not available 24/7.   If you have an urgent issue and are unable to reach [place], you may choose to seek medical care at your doctor's office, retail clinic, urgent care center, or emergency room.  If you have a medical emergency, please immediately call 911 or go to the emergency department.  Pager Numbers  - Dr. Nehemiah Massed: [phone]  - Dr. Laurence Ferrari: [phone]  - Dr. Nicole Kindred: [phone]  In the event of inclement weather, please call our main line at [phone] for an update on the status of any delays or closures.  Dermatology Medication Tips: Please keep the boxes that topical medications come in in order to help keep track of the instructions about where and how to use these. Pharmacies typically print the medication instructions only on the boxes and not directly on the medication tubes.   If your medication is too expensive, please contact our office at [phone] option 4 or send [place] a message through [place].   We are unable to tell what your co-pay for medications will be in advance as this is different depending on your insurance coverage. However, we may be able to find a substitute medication at lower cost or fill out paperwork to get insurance to cover a needed medication.   If a prior authorization is required to get your medication covered by your insurance company, please allow [place] 1-2 business days to complete this process.  Drug prices often vary depending on where the prescription is filled and some pharmacies may offer cheaper prices.  The website www.goodrx.com contains coupons for medications through different pharmacies. The prices here do not account for what the cost may be with help from insurance (it may be cheaper with your insurance), but the website can give you the price if you  did not use any insurance.  - You can print the associated coupon and take it with your prescription to the pharmacy.  - You may also stop by our office during regular business hours and pick up a GoodRx coupon card.  - If you need your prescription sent electronically to a different pharmacy, notify our office through [facility] or by phone at [phone] option 4.     Si Usted Necesita Algo Despus de Su Visita  Tambin puede enviarnos un mensaje a travs de Pharmacist, community. Por lo general respondemos a los mensajes de MyChart en el transcurso de 1 a 2 das hbiles.  Para renovar recetas, por favor pida a su farmacia que se ponga en contacto con nuestra oficina. Harland Dingwall de fax es [place] [phone].  Si tiene un asunto urgente cuando la clnica est cerrada y que no  puede esperar hasta el siguiente da hbil, puede llamar/localizar a su doctor(a) al nmero que aparece a continuacin.   Por favor, tenga en cuenta que aunque hacemos todo lo posible para estar disponibles para asuntos urgentes fuera del horario de [place], no estamos disponibles las 24 horas del da, los 7 das de la [place].   Si tiene un problema urgente y no puede comunicarse con nosotros, puede optar por buscar atencin mdica  en el consultorio de su doctor(a), en una clnica privada, en un centro de atencin urgente o en una sala de emergencias.  Si tiene Engineering geologist, por favor llame inmediatamente al 911 o vaya a la sala de emergencias.  Nmeros de bper  - Dr. Nehemiah Massed: [phone]  - Dra. Moye: [phone]  - Dra. Nicole Kindred: [phone]  En caso de inclemencias del [place], por favor llame a Johnsie Kindred principal al [phone] para una actualizacin sobre el [place] de cualquier retraso o cierre.  Consejos para la medicacin en dermatologa: Por favor, guarde las cajas en las que vienen los medicamentos de uso tpico para ayudarle a seguir las instrucciones sobre dnde y cmo usarlos. Las  farmacias generalmente imprimen las instrucciones del medicamento slo en las cajas y no directamente en los tubos del [place].   Si su medicamento es muy caro, por favor, pngase en contacto con Zigmund Daniel llamando al [phone] y presione la opcin 4 o envenos un mensaje a travs de Pharmacist, community.   No podemos decirle cul ser su copago por los medicamentos por adelantado ya que esto es diferente dependiendo de la cobertura de su seguro. Sin embargo, es posible que podamos encontrar un medicamento sustituto a Electrical engineer un formulario para que el seguro cubra el medicamento que se considera necesario.   Si se requiere una autorizacin previa para que su compaa de seguros [place] su medicamento, por favor permtanos de 1 a 2 das hbiles para completar este proceso.  Los precios de los medicamentos varan con frecuencia dependiendo del Environmental consultant de dnde se surte la receta y alguna farmacias pueden ofrecer precios ms baratos.  El sitio web www.goodrx.com tiene cupones para medicamentos de Airline pilot. Los precios aqu no tienen en cuenta lo que podra costar con la ayuda del seguro (puede ser ms barato con su seguro), pero el sitio web puede darle el precio si no utiliz Research scientist (physical sciences).  - Puede imprimir el cupn correspondiente y llevarlo con su receta a la farmacia.  - Tambin puede pasar por nuestra oficina durante el horario de atencin regular y Charity fundraiser una tarjeta de cupones de GoodRx.  - Si necesita que su receta se enve electrnicamente a una farmacia diferente, informe a nuestra oficina a travs de MyChart de [place] o por telfono llamando al [phone] y presione la opcin 4.

## 2021-03-22 ENCOUNTER — Encounter: Payer: Self-pay | Admitting: Dermatology

## 2021-03-24 ENCOUNTER — Other Ambulatory Visit: Payer: Self-pay

## 2021-03-24 ENCOUNTER — Emergency Department
Admission: EM | Admit: 2021-03-24 | Discharge: 2021-03-24 | Disposition: A | Discharge status: Home / Self Care | Payer: BC Managed Care – PPO | Attending: Emergency Medicine | Admitting: Emergency Medicine

## 2021-03-24 DIAGNOSIS — W269XXA Contact with unspecified sharp object(s), initial encounter: Secondary | ICD-10-CM | POA: Diagnosis not present

## 2021-03-24 DIAGNOSIS — S61210A Laceration without foreign body of right index finger without damage to nail, initial encounter: Secondary | ICD-10-CM | POA: Insufficient documentation

## 2021-03-24 DIAGNOSIS — S6991XA Unspecified injury of right wrist, hand and finger(s), initial encounter: Secondary | ICD-10-CM | POA: Diagnosis not present

## 2021-03-24 NOTE — ED Triage Notes (Signed)
Pt in via POV, reports cutting right index finger tonight while cutting okra, approximate 1" laceration below fingernail, bleeding controlled at this time.  Dry dressing placed. ? ?Vitals WDL. ?

## 2021-03-24 NOTE — ED Provider Notes (Signed)
? ?[facility] ?Provider Note ? ?Patient Contact: 11:10 PM (approximate) ? ? ?History  ? ?Laceration ? ? ?HPI ? ?Christie Ramirez is a 66 y.o. female presents to the emergency department with a 0.25 cm right index finger laceration that was sustained accidentally cutting okra.  Patient reports her tetanus status is up-to-date.  No numbness or tingling in the right hand.  No other alleviating measures have been attempted. ? ?  ? ? ?Physical Exam  ? ?Triage Vital Signs: ?ED Triage Vitals  ?Enc Vitals Group  ?   BP 03/24/21 2154 (!) 131/92  ?   Pulse Rate 03/24/21 2154 91  ?   Resp 03/24/21 2154 16  ?   Temp 03/24/21 2154 97.9 ?F (36.6 ?C)  ?   Temp Source 03/24/21 2154 Oral  ?   SpO2 03/24/21 2154 95 %  ?   Weight 03/24/21 2156 150 lb (68 kg)  ?   Height 03/24/21 2156 5\' 2"  (1.575 m)  ?   Head Circumference --   ?   Peak Flow --   ?   Pain Score 03/24/21 2155 6  ?   Pain Loc --   ?   Pain Edu? --   ?   Excl. in [place]? --   ? ? ?Most recent vital signs: ?Vitals:  ? 03/24/21 2154  ?BP: (!) 131/92  ?Pulse: 91  ?Resp: 16  ?Temp: 97.9 ?F (36.6 ?C)  ?SpO2: 95%  ? ? ? ?General: Alert and in no acute distress. ?Eyes:  PERRL. EOMI. ?Head: No acute traumatic findings ?ENT: ?     Ears:  ?     Nose: No congestion/rhinnorhea. ?     Mouth/Throat: Mucous membranes are moist. ?Neck: No stridor. No cervical spine tenderness to palpation. ?Cardiovascular:  Good peripheral perfusion ?Respiratory: Normal respiratory effort without tachypnea or retractions. Lungs CTAB. Good air entry to the bases with no decreased or absent breath sounds. ?Gastrointestinal: Bowel sounds ?4 quadrants. Soft and nontender to palpation. No guarding or rigidity. No palpable masses. No distention. No CVA tenderness. ?Musculoskeletal: Full range of motion to all extremities.  ?Neurologic:  No gross focal neurologic deficits are appreciated.  ?Skin: Patient has 0.25 cm linear laceration along right index finger.  Laceration is well  approximated. ? ? ? ?ED Results / Procedures / Treatments  ? ?Labs ?(all labs ordered are listed, but only abnormal results are displayed) ?Labs Reviewed - No data to display ? ? ? ? ? ? ? ?PROCEDURES: ? ?Critical Care performed: No ? ?Marland Kitchen.Laceration Repair ? ?Date/Time: 03/24/2021 11:11 PM ?Performed by: Vallarie Mare M, PA-C ?Authorized by: Vallarie Mare M, PA-C  ? ?Consent:  ?  Consent obtained:  Verbal ?  Risks discussed:  Infection and pain ?Universal protocol:  ?  Procedure explained and questions answered to patient or proxy's satisfaction: yes   ?  Patient identity confirmed:  Verbally with patient ?Anesthesia:  ?  Anesthesia method:  Topical application ?Laceration details:  ?  Location:  Finger ?  Finger location:  R index finger ?  Length (cm):  0.3 ?Exploration:  ?  Limited defect created (wound extended): yes   ?  Contaminated: no   ?Treatment:  ?  Amount of cleaning:  Standard ?  Debridement:  None ?Skin repair:  ?  Repair method:  Tissue adhesive ?Approximation:  ?  Approximation:  Close ?Repair type:  ?  Repair type:  Simple ? ? ?MEDICATIONS ORDERED IN ED: ?Medications - No data to  display ? ? ?IMPRESSION / MDM / ASSESSMENT AND PLAN / ED COURSE  ?I reviewed the triage vital signs and the nursing notes. ?             ?               ?Assessment and Plan: ?Laceration:  ?Differential diagnosis includes, but is not limited to, laceration ?66 year old female presents to the emergency department with 8.25 cm right index finger laceration repaired in the emergency department using Dermabond.  Return precautions were given to return with new or worsening symptoms.  All patient questions were answered. ? ?  ? ? ?FINAL CLINICAL IMPRESSION(S) / ED DIAGNOSES  ? ?Final diagnoses:  ?Laceration of right index finger without foreign body without damage to nail, initial encounter  ? ? ? ?Rx / DC Orders  ? ?ED Discharge Orders   ? ? None  ? ?  ? ? ? ?Note:  This document was prepared using Dragon voice recognition  software and may include unintentional dictation errors. ?  ?Lannie Fields, PA-C ?03/24/21 2313 ? ?  ?Carrie Mew, MD ?03/25/21 1653 ? ?

## 2021-04-30 ENCOUNTER — Other Ambulatory Visit: Payer: Self-pay | Admitting: Family Medicine

## 2021-04-30 DIAGNOSIS — E890 Postprocedural hypothyroidism: Secondary | ICD-10-CM

## 2021-05-23 ENCOUNTER — Other Ambulatory Visit: Payer: Self-pay | Admitting: Family Medicine

## 2021-05-23 DIAGNOSIS — I10 Essential (primary) hypertension: Secondary | ICD-10-CM

## 2021-06-26 ENCOUNTER — Other Ambulatory Visit: Payer: Self-pay | Admitting: Family Medicine

## 2021-06-27 NOTE — Telephone Encounter (Signed)
LMTCB to scheduled follow-up appointment.

## 2021-08-10 ENCOUNTER — Ambulatory Visit (INDEPENDENT_AMBULATORY_CARE_PROVIDER_SITE_OTHER): Payer: 59 | Admitting: Family Medicine

## 2021-08-10 ENCOUNTER — Encounter: Payer: Self-pay | Admitting: Family Medicine

## 2021-08-10 VITALS — BP 120/70 | HR 65 | Temp 97.6°F | Ht 60.0 in | Wt 153.9 lb

## 2021-08-10 DIAGNOSIS — Z0001 Encounter for general adult medical examination with abnormal findings: Secondary | ICD-10-CM

## 2021-08-10 DIAGNOSIS — Z6830 Body mass index (BMI) 30.0-30.9, adult: Secondary | ICD-10-CM | POA: Diagnosis not present

## 2021-08-10 DIAGNOSIS — E6609 Other obesity due to excess calories: Secondary | ICD-10-CM

## 2021-08-10 DIAGNOSIS — Z0000 Encounter for general adult medical examination without abnormal findings: Secondary | ICD-10-CM

## 2021-08-10 DIAGNOSIS — I10 Essential (primary) hypertension: Secondary | ICD-10-CM

## 2021-08-10 DIAGNOSIS — G44209 Tension-type headache, unspecified, not intractable: Secondary | ICD-10-CM | POA: Diagnosis not present

## 2021-08-10 DIAGNOSIS — E890 Postprocedural hypothyroidism: Secondary | ICD-10-CM

## 2021-08-10 DIAGNOSIS — E785 Hyperlipidemia, unspecified: Secondary | ICD-10-CM | POA: Diagnosis not present

## 2021-08-10 DIAGNOSIS — J309 Allergic rhinitis, unspecified: Secondary | ICD-10-CM | POA: Insufficient documentation

## 2021-08-10 MED ORDER — FLUTICASONE PROPIONATE 50 MCG/ACT NA SUSP: 2.0000 | Freq: Every day | NASAL | 6 refills | Status: DC
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
## 2021-08-10 NOTE — Assessment & Plan Note (Signed)
Physical exam completed.  Encouraged continued healthy diet and exercise.  Discussed reducing ice cream intake.  Cancer screening is up-to-date.  She declines COVID vaccination.  Lab work as outlined.

## 2021-08-10 NOTE — Patient Instructions (Signed)
Nice to see you. Get labs today. Please try stopping the Zetia and see if that helps with your headaches.  If it does not help please let me know. Please try the Flonase for your postnasal drip and hoarseness.

## 2021-08-10 NOTE — Assessment & Plan Note (Signed)
Discussed that her hoarseness and postnasal drip might be related to allergic rhinitis.  She will continue Zyrtec.  She will trial Flonase.  If not improving over the next several weeks she will let us know.

## 2021-08-10 NOTE — Assessment & Plan Note (Signed)
The patient has been having some headaches within 30 minutes of taking cholesterol and blood pressure medications.  This would argue that this may be related to the medications.  I will have her stop the Zetia as it appears that there are postmarketing reports of headaches related to this medication.  If these do not resolve in the next several weeks with this she will let me know.

## 2021-08-11 LAB — COMPREHENSIVE METABOLIC PANEL
ALT: 71 U/L — ABNORMAL HIGH (ref 0–35)
AST: 22 U/L (ref 0–37)
Albumin: 4.6 g/dL (ref 3.5–5.2)
Alkaline Phosphatase: 95 U/L (ref 39–117)
BUN: 18 mg/dL (ref 6–23)
CO2: 29 mEq/L (ref 19–32)
Calcium: 10 mg/dL (ref 8.4–10.5)
Chloride: 101 mEq/L (ref 96–112)
Creatinine, Ser: 0.73 mg/dL (ref 0.40–1.20)
GFR: 86.6 mL/min (ref >60.00)
Glucose, Bld: 85 mg/dL (ref 70–99)
Potassium: 4.2 mEq/L (ref 3.5–5.1)
Sodium: 138 mEq/L (ref 135–145)
Total Bilirubin: 1 mg/dL (ref 0.2–1.2)
Total Protein: 6.8 g/dL (ref 6.0–8.3)

## 2021-08-11 LAB — HEMOGLOBIN A1C: Hgb A1c MFr Bld: 6.2 % (ref 4.6–6.5)

## 2021-08-11 LAB — LIPID PANEL
Cholesterol: 120 mg/dL (ref 0–200)
HDL: 45.3 mg/dL (ref >39.00)
LDL Cholesterol: 60 mg/dL (ref 0–99)
NonHDL: 74.69
Total CHOL/HDL Ratio: 3
Triglycerides: 75 mg/dL (ref 0.0–149.0)
VLDL: 15 mg/dL (ref 0.0–40.0)

## 2021-08-11 LAB — TSH: TSH: 1.01 u[IU]/mL (ref 0.35–5.50)

## 2021-08-12 ENCOUNTER — Telehealth: Payer: Self-pay

## 2021-08-12 NOTE — Telephone Encounter (Signed)
LMTCB for labs. 

## 2021-09-17 ENCOUNTER — Other Ambulatory Visit: Payer: Self-pay | Admitting: Family Medicine

## 2021-09-17 DIAGNOSIS — E890 Postprocedural hypothyroidism: Secondary | ICD-10-CM

## 2021-10-28 ENCOUNTER — Ambulatory Visit: Payer: 59 | Admitting: Podiatry

## 2021-11-07 ENCOUNTER — Other Ambulatory Visit: Payer: Self-pay | Admitting: Family Medicine

## 2021-11-11 ENCOUNTER — Ambulatory Visit: Payer: 59 | Admitting: Podiatry

## 2021-11-20 ENCOUNTER — Other Ambulatory Visit: Payer: Self-pay | Admitting: Family Medicine

## 2021-11-20 DIAGNOSIS — J069 Acute upper respiratory infection, unspecified: Secondary | ICD-10-CM | POA: Diagnosis not present

## 2021-11-20 DIAGNOSIS — F32A Depression, unspecified: Secondary | ICD-10-CM

## 2021-11-20 DIAGNOSIS — F411 Generalized anxiety disorder: Secondary | ICD-10-CM

## 2021-11-20 DIAGNOSIS — H6692 Otitis media, unspecified, left ear: Secondary | ICD-10-CM | POA: Diagnosis not present

## 2021-11-20 DIAGNOSIS — Z20822 Contact with and (suspected) exposure to covid-19: Secondary | ICD-10-CM | POA: Diagnosis not present

## 2021-11-20 DIAGNOSIS — R070 Pain in throat: Secondary | ICD-10-CM | POA: Diagnosis not present

## 2021-11-27 ENCOUNTER — Other Ambulatory Visit: Payer: Self-pay | Admitting: Family Medicine

## 2021-11-27 DIAGNOSIS — I10 Essential (primary) hypertension: Secondary | ICD-10-CM

## 2021-12-14 ENCOUNTER — Telehealth: Payer: Self-pay

## 2021-12-14 DIAGNOSIS — G4700 Insomnia, unspecified: Secondary | ICD-10-CM

## 2021-12-14 MED ORDER — TRAZODONE HCL 50 MG PO TABS: ORAL_TABLET | ORAL | 1 refills | Status: DC

## 2021-12-14 NOTE — Telephone Encounter (Signed)
Patient states she would like for [place] to call her if she needs to be seen before she can have this refill for her traZODone (DESYREL) 50 MG tablet.

## 2021-12-14 NOTE — Telephone Encounter (Signed)
Patient states she needs a refill for her traZODone (DESYREL) 50 MG tablet.  Patient states she has about a week left.  *Patient states her preferred [facility] in [place].

## 2021-12-14 NOTE — Telephone Encounter (Signed)
Sent to pharmacy 

## 2022-01-13 DIAGNOSIS — R059 Cough, unspecified: Secondary | ICD-10-CM | POA: Diagnosis not present

## 2022-01-13 DIAGNOSIS — U071 COVID-19: Secondary | ICD-10-CM | POA: Diagnosis not present

## 2022-01-13 DIAGNOSIS — R0602 Shortness of breath: Secondary | ICD-10-CM | POA: Diagnosis not present

## 2022-01-13 DIAGNOSIS — R0981 Nasal congestion: Secondary | ICD-10-CM | POA: Diagnosis not present

## 2022-01-17 DIAGNOSIS — J101 Influenza due to other identified influenza virus with other respiratory manifestations: Secondary | ICD-10-CM | POA: Diagnosis not present

## 2022-01-17 DIAGNOSIS — R509 Fever, unspecified: Secondary | ICD-10-CM | POA: Diagnosis not present

## 2022-02-10 ENCOUNTER — Ambulatory Visit: Payer: Self-pay | Admitting: Family Medicine

## 2022-02-27 ENCOUNTER — Telehealth: Payer: Self-pay | Admitting: Oncology

## 2022-02-27 ENCOUNTER — Other Ambulatory Visit: Payer: Self-pay

## 2022-02-27 DIAGNOSIS — Z853 Personal history of malignant neoplasm of breast: Secondary | ICD-10-CM

## 2022-02-27 NOTE — Telephone Encounter (Signed)
Talked to [facility]. Pt will need Diagnostic mammo. New orders entered. Please schedule mammo and inform pt of appt.

## 2022-02-27 NOTE — Telephone Encounter (Signed)
Pt called to report that she contacted [facility] to schedule her mammogram and when they asked her was she having complications she expressed that she was. They are now asking for a screening order to be entered. Please contact the patient to discuss.

## 2022-03-03 NOTE — Telephone Encounter (Signed)
Christie Ramirez, can you follow up on Mammo please. Appts with Dr. Tasia Catchings will need to be r/s to be a few days after mammo. Please inform pt of new appt details. Thanks.

## 2022-03-07 ENCOUNTER — Encounter: Payer: Self-pay | Admitting: Family Medicine

## 2022-03-07 ENCOUNTER — Ambulatory Visit (INDEPENDENT_AMBULATORY_CARE_PROVIDER_SITE_OTHER): Payer: Medicare HMO | Admitting: Family Medicine

## 2022-03-07 VITALS — BP 120/78 | HR 91 | Temp 97.6°F | Ht 60.0 in | Wt 155.4 lb

## 2022-03-07 DIAGNOSIS — E890 Postprocedural hypothyroidism: Secondary | ICD-10-CM | POA: Diagnosis not present

## 2022-03-07 DIAGNOSIS — I10 Essential (primary) hypertension: Secondary | ICD-10-CM

## 2022-03-07 DIAGNOSIS — F32A Depression, unspecified: Secondary | ICD-10-CM

## 2022-03-07 DIAGNOSIS — E559 Vitamin D deficiency, unspecified: Secondary | ICD-10-CM

## 2022-03-07 DIAGNOSIS — R69 Illness, unspecified: Secondary | ICD-10-CM | POA: Diagnosis not present

## 2022-03-07 DIAGNOSIS — F419 Anxiety disorder, unspecified: Secondary | ICD-10-CM

## 2022-03-07 DIAGNOSIS — J309 Allergic rhinitis, unspecified: Secondary | ICD-10-CM | POA: Diagnosis not present

## 2022-03-07 DIAGNOSIS — R7303 Prediabetes: Secondary | ICD-10-CM | POA: Diagnosis not present

## 2022-03-07 LAB — VITAMIN D 25 HYDROXY (VIT D DEFICIENCY, FRACTURES): VITD: 26.12 ng/mL — ABNORMAL LOW (ref 30.00–100.00)

## 2022-03-07 LAB — BASIC METABOLIC PANEL
BUN: 17 mg/dL (ref 6–23)
CO2: 28 mEq/L (ref 19–32)
Calcium: 9.8 mg/dL (ref 8.4–10.5)
Chloride: 100 mEq/L (ref 96–112)
Creatinine, Ser: 0.74 mg/dL (ref 0.40–1.20)
GFR: 84.86 mL/min (ref >60.00)
Glucose, Bld: 110 mg/dL — ABNORMAL HIGH (ref 70–99)
Potassium: 3.9 mEq/L (ref 3.5–5.1)
Sodium: 138 mEq/L (ref 135–145)

## 2022-03-07 LAB — TSH: TSH: 2.8 u[IU]/mL (ref 0.35–5.50)

## 2022-03-07 LAB — HEMOGLOBIN A1C: Hgb A1c MFr Bld: 6 % (ref 4.6–6.5)

## 2022-03-07 MED ORDER — CETIRIZINE HCL 10 MG PO TABS: ORAL_TABLET | ORAL | 3 refills | Status: DC

## 2022-03-07 MED ORDER — FLUTICASONE PROPIONATE 50 MCG/ACT NA SUSP: 2.0000 | Freq: Every day | NASAL | 6 refills | Status: DC

## 2022-03-07 NOTE — Assessment & Plan Note (Signed)
Check A1c. 

## 2022-03-07 NOTE — Progress Notes (Signed)
[name], MD Phone: [phone]  Christie Ramirez is a 67 y.o. female who presents today for f/u.  HYPERTENSION Disease Monitoring Home BP Monitoring not checking Chest pain- no    Dyspnea- no Medications Compliance-  taking amlodipine, losartan.   Edema- no BMET    Component Value Date/Time   NA 138 08/10/2021 1609   NA 136 04/27/2014 1112   K 4.2 08/10/2021 1609   K 4.0 04/27/2014 1112   CL 101 08/10/2021 1609   CL 101 04/27/2014 1112   CO2 29 08/10/2021 1609   CO2 25 04/27/2014 1112   GLUCOSE 85 08/10/2021 1609   GLUCOSE 124 (H) 04/27/2014 1112   BUN 18 08/10/2021 1609   BUN 23 (H) 04/27/2014 1112   CREATININE 0.73 08/10/2021 1609   CREATININE 0.74 04/27/2014 1112   CALCIUM 10.0 08/10/2021 1609   CALCIUM 8.5 (L) 04/27/2014 1112   GFRNONAA >60 03/17/2021 1432   GFRNONAA >60 04/27/2014 1112   GFRAA >60 11/23/2018 0050   GFRAA >60 04/27/2014 1112   Anxiety/depression: Patient notes no depression.  She does have some anxiety where she wakes up at night thinking about how life is progressed.  She is no longer on Lexapro.  She notes no SI or HI.  She does take trazodone for sleep.  She meditates and walks some.  Allergic rhinitis: Patient notes some nasal congestion with white "boogers."  She also notes postnasal drip.  Not sneezing much.  No eye issues.  No fevers.  She takes cetirizine for this and that is beneficial when she does take it.  She does note her left ear gets clogged at times.  Social History   Tobacco Use  Smoking Status Never  Smokeless Tobacco Never    Current Outpatient Medications on File Prior to Visit  Medication Sig Dispense Refill   albuterol (VENTOLIN HFA) 108 (90 Base) MCG/ACT inhaler Inhale 2 puffs into the lungs every 6 (six) hours as needed for wheezing or shortness of breath. 1 each 1   amLODipine (NORVASC) 5 MG tablet TAKE 2 TABLETS (10 MG TOTAL) BY MOUTH DAILY. 60 tablet 1   aspirin 81 MG tablet Take 81 mg by mouth daily.      atorvastatin (LIPITOR) 40 MG tablet TAKE ONE TABLET BY MOUTH DAILY 90 tablet 0   cyclobenzaprine (FLEXERIL) 10 MG tablet Take 1 tablet (10 mg total) by mouth 3 (three) times daily as needed for muscle spasms. 30 tablet 0   gabapentin (NEURONTIN) 100 MG capsule TAKE ONE CAPSULE BY MOUTH THREE TIMES A DAY 90 capsule 1   GARLIC PO Take 0000000 mg by mouth.     levothyroxine (SYNTHROID) 137 MCG tablet TAKE ONE TABLET BY MOUTH DAILY BEFORE BREAKFAST 90 tablet 1   losartan (COZAAR) 100 MG tablet TAKE ONE TABLET BY MOUTH DAILY 90 tablet 1   Multiple Vitamins-Minerals (MULTIVITAMIN WITH MINERALS) tablet Take 1 tablet by mouth daily.     vitamin C (ASCORBIC ACID) 500 MG tablet Take 500 mg by mouth daily.     traZODone (DESYREL) 50 MG tablet TAKE TWO TABLETS BY MOUTH ONCE NIGHTLY AT BEDTIME AS NEEDED FOR SLEEP (Patient not taking: Reported on 03/07/2022) 180 tablet 1   Current Facility-Administered Medications on File Prior to Visit  Medication Dose Route Frequency Provider Last Rate Last Admin   betamethasone acetate-betamethasone sodium phosphate (CELESTONE) injection 3 mg  3 mg Intra-articular Once Edrick Kins, DPM         ROS see history of present illness  Objective  Physical Exam Vitals:   03/07/22 0912  BP: 120/78  Pulse: 91  Temp: 97.6 F (36.4 C)  SpO2: 95%    BP Readings from Last 3 Encounters:  03/07/22 120/78  08/10/21 120/70  03/24/21 (!) 131/92   Wt Readings from Last 3 Encounters:  03/07/22 155 lb 6.4 oz (70.5 kg)  08/10/21 153 lb 14.4 oz (69.8 kg)  03/24/21 150 lb (68 kg)    Physical Exam Constitutional:      General: She is not in acute distress.    Appearance: She is not diaphoretic.  HENT:     Right Ear: Tympanic membrane normal.     Left Ear: Tympanic membrane normal.     Mouth/Throat:     Mouth: Mucous membranes are moist.     Pharynx: Oropharynx is clear.  Cardiovascular:     Rate and Rhythm: Normal rate and regular rhythm.     Heart sounds: Normal heart  sounds.  Pulmonary:     Effort: Pulmonary effort is normal.     Breath sounds: Normal breath sounds.  Skin:    General: Skin is warm and dry.  Neurological:     Mental Status: She is alert.      Assessment/Plan: Please see individual problem list.  Primary hypertension Assessment & Plan: Chronic issue.  Well-controlled.  She will continue losartan 100 mg daily and amlodipine 10 mg daily.  Orders: -     Basic metabolic panel  Allergic rhinitis, unspecified seasonality, unspecified trigger Assessment & Plan: Chronic issue.  Recent symptoms are likely related to allergies.  She will trial Flonase 2 sprays each nostril once daily and start on cetirizine 10 mg daily.  If not improving she will let us know.  Orders: -     Fluticasone Propionate; Place 2 sprays into both nostrils daily.  Dispense: 16 g; Refill: 6 -     Cetirizine HCl; SMARTSIG:1 Pill By Mouth Daily PRN  Dispense: 90 tablet; Refill: 3  Anxiety and depression Assessment & Plan: Chronic issue.  Notes no depression.  Having some anxiety.  Discussed the potential for going back on Lexapro though she defers that at this time.  She can continue trazodone 100 mg nightly for sleep.  Encouraged meditation and exercise.  She will let us know if she would like to proceed with Lexapro in the future.   Vitamin D deficiency Assessment & Plan: Check vitamin D.  Orders: -     VITAMIN D 25 Hydroxy (Vit-D Deficiency, Fractures)  Prediabetes Assessment & Plan: Check A1c.  Orders: -     Hemoglobin A1c  Postoperative hypothyroidism Assessment & Plan: Check TSH.  Continue levothyroxine 137 mcg daily.  Orders: -     TSH   Health maintenance: Discussed colonoscopy was due in 2030.  Return in about 6 months (around 09/05/2022) for physical.   [name], MD [place]

## 2022-03-07 NOTE — Assessment & Plan Note (Signed)
Chronic issue.  Well-controlled.  She will continue losartan 100 mg daily and amlodipine 10 mg daily.

## 2022-03-07 NOTE — Assessment & Plan Note (Signed)
Chronic issue.  Notes no depression.  Having some anxiety.  Discussed the potential for going back on Lexapro though she defers that at this time.  She can continue trazodone 100 mg nightly for sleep.  Encouraged meditation and exercise.  She will let us know if she would like to proceed with Lexapro in the future.

## 2022-03-07 NOTE — Assessment & Plan Note (Signed)
Chronic issue.  Recent symptoms are likely related to allergies.  She will trial Flonase 2 sprays each nostril once daily and start on cetirizine 10 mg daily.  If not improving she will let us know.

## 2022-03-07 NOTE — Assessment & Plan Note (Signed)
Check TSH.  Continue levothyroxine 137 mcg daily.

## 2022-03-07 NOTE — Patient Instructions (Signed)
Nice to see you. We will try cetirizine and Flonase to see if that helps with your allergy symptoms.  If it does not please let us know. We will get lab work today and contact you with the results.

## 2022-03-07 NOTE — Assessment & Plan Note (Signed)
Check vitamin D. 

## 2022-03-08 ENCOUNTER — Inpatient Hospital Stay: Payer: Medicare HMO | Attending: Oncology

## 2022-03-08 ENCOUNTER — Encounter: Payer: Self-pay | Admitting: Oncology

## 2022-03-08 ENCOUNTER — Inpatient Hospital Stay (HOSPITAL_BASED_OUTPATIENT_CLINIC_OR_DEPARTMENT_OTHER): Payer: Medicare HMO | Admitting: Oncology

## 2022-03-08 ENCOUNTER — Telehealth: Payer: Self-pay

## 2022-03-08 VITALS — BP 150/95 | HR 88 | Temp 97.0°F | Resp 18 | Wt 155.6 lb

## 2022-03-08 DIAGNOSIS — C50211 Malignant neoplasm of upper-inner quadrant of right female breast: Secondary | ICD-10-CM

## 2022-03-08 DIAGNOSIS — I10 Essential (primary) hypertension: Secondary | ICD-10-CM | POA: Diagnosis not present

## 2022-03-08 DIAGNOSIS — M8580 Other specified disorders of bone density and structure, unspecified site: Secondary | ICD-10-CM | POA: Diagnosis not present

## 2022-03-08 DIAGNOSIS — Z170 Estrogen receptor positive status [ER+]: Secondary | ICD-10-CM

## 2022-03-08 DIAGNOSIS — R7401 Elevation of levels of liver transaminase levels: Secondary | ICD-10-CM

## 2022-03-08 DIAGNOSIS — Z853 Personal history of malignant neoplasm of breast: Secondary | ICD-10-CM | POA: Diagnosis not present

## 2022-03-08 DIAGNOSIS — Z923 Personal history of irradiation: Secondary | ICD-10-CM | POA: Diagnosis not present

## 2022-03-08 DIAGNOSIS — F418 Other specified anxiety disorders: Secondary | ICD-10-CM | POA: Diagnosis not present

## 2022-03-08 DIAGNOSIS — Z808 Family history of malignant neoplasm of other organs or systems: Secondary | ICD-10-CM | POA: Insufficient documentation

## 2022-03-08 DIAGNOSIS — R69 Illness, unspecified: Secondary | ICD-10-CM | POA: Diagnosis not present

## 2022-03-08 DIAGNOSIS — Z90710 Acquired absence of both cervix and uterus: Secondary | ICD-10-CM | POA: Insufficient documentation

## 2022-03-08 LAB — COMPREHENSIVE METABOLIC PANEL
ALT: 39 U/L (ref 0–44)
AST: 22 U/L (ref 15–41)
Albumin: 4.3 g/dL (ref 3.5–5.0)
Alkaline Phosphatase: 89 U/L (ref 38–126)
Anion gap: 10 (ref 5–15)
BUN: 14 mg/dL (ref 8–23)
CO2: 26 mmol/L (ref 22–32)
Calcium: 9.1 mg/dL (ref 8.9–10.3)
Chloride: 100 mmol/L (ref 98–111)
Creatinine, Ser: 0.62 mg/dL (ref 0.44–1.00)
GFR, Estimated: >60 mL/min (ref >60)
Glucose, Bld: 147 mg/dL — ABNORMAL HIGH (ref 70–99)
Potassium: 3.7 mmol/L (ref 3.5–5.1)
Sodium: 136 mmol/L (ref 135–145)
Total Bilirubin: 0.9 mg/dL (ref 0.3–1.2)
Total Protein: 7.1 g/dL (ref 6.5–8.1)

## 2022-03-08 LAB — CBC WITH DIFFERENTIAL/PLATELET
Abs Immature Granulocytes: 0.01 10*3/uL (ref 0.00–0.07)
Basophils Absolute: 0.1 10*3/uL (ref 0.0–0.1)
Basophils Relative: 1 %
Eosinophils Absolute: 0.2 10*3/uL (ref 0.0–0.5)
Eosinophils Relative: 2 %
HCT: 38.6 % (ref 36.0–46.0)
Hemoglobin: 12.7 g/dL (ref 12.0–15.0)
Immature Granulocytes: 0 %
Lymphocytes Relative: 39 %
Lymphs Abs: 3.5 10*3/uL (ref 0.7–4.0)
MCH: 28.3 pg (ref 26.0–34.0)
MCHC: 32.9 g/dL (ref 30.0–36.0)
MCV: 86 fL (ref 80.0–100.0)
Monocytes Absolute: 0.5 10*3/uL (ref 0.1–1.0)
Monocytes Relative: 6 %
Neutro Abs: 4.7 10*3/uL (ref 1.7–7.7)
Neutrophils Relative %: 52 %
Platelets: 320 10*3/uL (ref 150–400)
RBC: 4.49 MIL/uL (ref 3.87–5.11)
RDW: 13.2 % (ref 11.5–15.5)
WBC: 8.8 10*3/uL (ref 4.0–10.5)
nRBC: 0 % (ref 0.0–0.2)

## 2022-03-08 NOTE — Assessment & Plan Note (Signed)
06/14/2020 bone density showed osteopenia, slightly improved..   Recommend calcium and vitamin D supplementation. Repeat DEXA in May 2024

## 2022-03-08 NOTE — Progress Notes (Signed)
Pt here for follow up. Pt reporting tenderness to Right breast RUQ.

## 2022-03-08 NOTE — Assessment & Plan Note (Signed)
#  History of stage I pT1a pN0 right breast cancer, diagnosed in April 2015, ER/PR positive, HER-2 negative, Not able to tolerate aromatase inhibitor.  Currently not on any antiestrogen treatments. Close to 9 years since initial diagnosis.  Patient is doing well clinically.  Right outer upper quadrant tenderness Check bilateral diagnostic mammogram

## 2022-03-08 NOTE — Telephone Encounter (Signed)
LM FOR PT TO CB RE:  Christie Haven, MD  [name] Clinical Please let the patient know her vitamin D is little low.  Can you confirm if she is taking any vitamin D?  If she is not taking any vitamin D she should start a 1000 international units once daily.  Her A1c has improved slightly though remains in the prediabetic range.  She needs to try to exercise and monitor her diet for that.  Her other lab work is acceptable.  Thanks.

## 2022-03-08 NOTE — Telephone Encounter (Signed)
Left message to return call to our office.  

## 2022-03-08 NOTE — Telephone Encounter (Signed)
Patient returned office phone call and lab results read.

## 2022-03-08 NOTE — Progress Notes (Signed)
Hematology/Oncology Progress note [phone] [phone]    CHIEF COMPLAINTS/REASON FOR VISIT:  Follow up for  history of breast cancer.  ASSESSMENT & PLAN:   Breast cancer of upper-inner quadrant of right female breast ([place]) #History of stage I pT1a pN0 right breast cancer, diagnosed in April 2015, ER/PR positive, HER-2 negative, Not able to tolerate aromatase inhibitor.  Currently not on any antiestrogen treatments. Close to 9 years since initial diagnosis.  Patient is doing well clinically.  Right outer upper quadrant tenderness Check bilateral diagnostic mammogram  Osteopenia 06/14/2020 bone density showed osteopenia, slightly improved..   Recommend calcium and vitamin D supplementation. Repeat DEXA in May 2024   Orders Placed This Encounter  Procedures   DG Bone Density    Standing Status:   Future    Standing Expiration Date:   03/09/2023    Order Specific Question:   Reason for Exam (SYMPTOM  OR DIAGNOSIS REQUIRED)    Answer:   history of breast cancer    Order Specific Question:   Preferred imaging location?    Answer:   [facility]   Follow up in a year All questions were answered. The patient knows to call the clinic with any problems, questions or concerns.  Earlie Server, MD, PhD [facility] Hematology Oncology 03/08/2022   HISTORY OF PRESENTING ILLNESS:   Christie Ramirez is a  67 y.o.  female with PMH listed below was seen in consultation at the request of  Leone Haven, MD  for evaluation of history of breast cancer. Patient previously was seen by Dr. Jeb Levering for 1 time.  She wants to reestablish care. Patient was recently seen by Dr. Marlou Starks as she felt a small mass in the upper inner left breast.  The size starts to grow and gets bigger. Extensive medical records reviewed was performed by me. Patient has a history of pT1a pN0 right breast cancer, diagnosed in April 2015, ER/PR positive, HER-2 negative, grade 1, DCIS present, margins  negative.  Status post lumpectomy,adjuvant radiation.  Patient was recommended by Dr. Oliva Bustard to start Letrozole. She tried for very short period of time and self stopped.  Per patient she was discharged from cancer center as she was not taking her medication. Patient follows up with her OB/GYN Dr. Enzo Bi, [facility] physician and was prescribed on Raloxifen 62m daily. Per records, she was started on Raloxifen in Feb 2018,   Tolerates well.  Denies any personal history of blood clots. She has history of hysterectomy. Her OB/GYN doctor recently retired.  She wants to establish care with oncology for further management of stage I Patient has anxiety and depression.  She comes to the clinic with her service dog.   INTERVAL HISTORY Christie Legardais a 67y.o. female who has above history reviewed by me today presents for follow up visit for breast cancer Patient reports right breast upper outer quadrant tenderness for 1.5 months. No palpable mass, nipple changes.  Otherwise no new concerns.    Review of Systems  Constitutional:  Negative for appetite change, chills, fatigue and fever.  HENT:   Negative for hearing loss and voice change.   Eyes:  Negative for eye problems.  Respiratory:  Negative for chest tightness and cough.   Cardiovascular:  Negative for chest pain.  Gastrointestinal:  Negative for abdominal distention, abdominal pain and blood in stool.  Endocrine: Negative for hot flashes.  Genitourinary:  Negative for difficulty urinating and frequency.   Musculoskeletal:  Negative for arthralgias.  Skin:  Negative for itching and rash.  Neurological:  Negative for extremity weakness.  Hematological:  Negative for adenopathy.  Psychiatric/Behavioral:  Negative for confusion.     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    Cancer [facility]) 1995   thyroid   Endometriosis 2009   Fibula fracture 11/11/2015   GERD (gastroesophageal reflux disease)    Hyperlipidemia    Hypertension    Hypothyroidism    Osteopenia    rt hip   Personal history of radiation therapy 2015   mammosite   Seasonal allergies    Sleep apnea    does not wear CPAP    SURGICAL HISTORY: Past Surgical History:  Procedure Laterality Date   ABDOMINAL HYSTERECTOMY  2005   Martin DeFrancesco,MD   BREAST BIOPSY Right 04-02-13   INVASIVE MAMMARY CARCINOMA WITH FEATURES OF TUBULAR CARCINOMA,   BREAST CYST EXCISION Left 12/02/2018   Procedure: EXCISION LEFT BREAST SEBACEOUS CYST;  Surgeon: Jovita Kussmaul, MD;  Location: [facility];  Service: General;  Laterality: Left;   BREAST LUMPECTOMY Right 04/2013   INVASIVE MAMMARY CARCINOMA WITH FEATURES OF TUBULAR CARCINOMA,   BREAST SURGERY Right 05/01/13   wide excision   colectomy  2009   secondary to endometriosis DR. Tamala Julian   COLON SURGERY  December 2008   right hemicolectomy for cecal mass identified as endometrioma. No malignancy.   COLONOSCOPY  2009   Dr. Tamala Julian   COLONOSCOPY WITH PROPOFOL N/A 11/19/2018   Procedure: COLONOSCOPY WITH PROPOFOL;  Surgeon: Lucilla Lame, MD;  Location: [facility] ENDOSCOPY;  Service: Endoscopy;  Laterality: N/A;   FOOT SURGERY  right   MASTOPEXY Bilateral 01/03/2019   Procedure: MASTOPEXY;  Surgeon: Irene Limbo, MD;  Location: [facility];  Service: Plastics;  Laterality: Bilateral;    SOCIAL HISTORY: Social History   Socioeconomic History   Marital status: Married    Spouse name: Not on file   Number of children: Not on file   Years of education: Not on file   Highest education level: Not on file  Occupational History   Not on file  Tobacco Use   Smoking status: Never   Smokeless tobacco: Never  Vaping Use   Vaping Use: Never used  Substance and Sexual Activity   Alcohol use:  Not Currently    Alcohol/week: 0.0 standard drinks of alcohol    Comment: wine socially   Drug use: No   Sexual activity: Yes    Birth control/protection: Surgical  Other Topics Concern   Not on file  Social History Narrative   Married   Likes to bake [employer]   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, quantity and offered job [place] but moved to [place] to take job at [employer] which she lost 12/09/2018 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 as she likes to be kind to people    Social Determinants of Radio broadcast assistant Strain: Not on file  Food Insecurity: Not on file  Transportation Needs: Not on file  Physical Activity: Not on file  Stress: Not on file  Social Connections: Not on file  Intimate Partner Violence: Not on file    FAMILY HISTORY: Family History  Problem Relation Age of Onset   Colon polyps Father  age 40   Heart disease Father    Bone cancer Father    Heart disease Mother    Rheumatic fever Mother    Heart disease Paternal Grandmother    Heart disease Paternal Grandfather    Cancer Neg Hx    Diabetes Neg Hx    Breast cancer Neg Hx     ALLERGIES:  is allergic to codeine, iodinated contrast media, iodine, and penicillins.  MEDICATIONS:  Current Outpatient Medications  Medication Sig Dispense Refill   albuterol (VENTOLIN HFA) 108 (90 Base) MCG/ACT inhaler Inhale 2 puffs into the lungs every 6 (six) hours as needed for wheezing or shortness of breath. 1 each 1   amLODipine (NORVASC) 5 MG tablet TAKE 2 TABLETS (10 MG TOTAL) BY MOUTH DAILY. 60 tablet 1   aspirin 81 MG tablet Take 81 mg by mouth daily.     atorvastatin (LIPITOR) 40 MG tablet TAKE ONE TABLET BY MOUTH DAILY 90 tablet 0   cetirizine (ZYRTEC) 10 MG tablet SMARTSIG:1 Pill By Mouth Daily PRN 90 tablet 3   cyclobenzaprine (FLEXERIL) 10 MG tablet Take 1 tablet (10 mg total) by mouth 3 (three) times daily as needed for muscle spasms. 30 tablet 0   fluticasone (FLONASE)  50 MCG/ACT nasal spray Place 2 sprays into both nostrils daily. 16 g 6   gabapentin (NEURONTIN) 100 MG capsule TAKE ONE CAPSULE BY MOUTH THREE TIMES A DAY 90 capsule 1   GARLIC PO Take 0000000 mg by mouth.     levothyroxine (SYNTHROID) 137 MCG tablet TAKE ONE TABLET BY MOUTH DAILY BEFORE BREAKFAST 90 tablet 1   losartan (COZAAR) 100 MG tablet TAKE ONE TABLET BY MOUTH DAILY 90 tablet 1   Multiple Vitamins-Minerals (MULTIVITAMIN WITH MINERALS) tablet Take 1 tablet by mouth daily.     traZODone (DESYREL) 50 MG tablet TAKE TWO TABLETS BY MOUTH ONCE NIGHTLY AT BEDTIME AS NEEDED FOR SLEEP 180 tablet 1   vitamin C (ASCORBIC ACID) 500 MG tablet Take 500 mg by mouth daily.     Current Facility-Administered Medications  Medication Dose Route Frequency Provider Last Rate Last Admin   betamethasone acetate-betamethasone sodium phosphate (CELESTONE) injection 3 mg  3 mg Intra-articular Once Edrick Kins, DPM         PHYSICAL EXAMINATION: ECOG PERFORMANCE STATUS: 0 - Asymptomatic Vitals:   03/08/22 1449  BP: (!) 150/95  Pulse: 88  Resp: 18  Temp: (!) 97 F (36.1 C)   Filed Weights   03/08/22 1449  Weight: 155 lb 9.6 oz (70.6 kg)    Physical Exam Constitutional:      General: She is not in acute distress. HENT:     Head: Normocephalic and atraumatic.  Eyes:     General: No scleral icterus.    Pupils: Pupils are equal, round, and reactive to light.  Cardiovascular:     Rate and Rhythm: Normal rate and regular rhythm.  Pulmonary:     Effort: Pulmonary effort is normal. No respiratory distress.     Breath sounds: No wheezing.  Abdominal:     General: Bowel sounds are normal. There is no distension.     Palpations: Abdomen is soft. There is no mass.     Tenderness: There is no abdominal tenderness.  Musculoskeletal:        General: No deformity. Normal range of motion.     Cervical back: Normal range of motion and neck supple.  Skin:    General: Skin is warm and dry.  Findings: No  erythema or rash.  Neurological:     Mental Status: She is alert and oriented to person, place, and time. Mental status is at baseline.     Cranial Nerves: No cranial nerve deficit.     Coordination: Coordination normal.  Psychiatric:        Mood and Affect: Mood normal.    Breast exam was performed in seated and lying down position. Patient is status post right lumpectomy with a well-healed surgical scar, 11-12 o'clock tissue thickening nodule.  Right upper outer quadrant tenderness.  No palpable breast masses bilaterally.  No palpable axillary lymphadenopathy.   LABORATORY DATA:  I have reviewed the data as listed    Latest Ref Rng & Units 03/08/2022    2:36 PM 03/17/2021    2:32 PM 07/25/2020    9:50 PM  CBC  WBC 4.0 - 10.5 K/uL 8.8  9.0  10.9   Hemoglobin 12.0 - 15.0 g/dL 12.7  12.8  12.5   Hematocrit 36.0 - 46.0 % 38.6  39.5  38.2   Platelets 150 - 400 K/uL 320  323  413       Latest Ref Rng & Units 03/08/2022    2:36 PM 03/07/2022    9:34 AM 08/10/2021    4:09 PM  CMP  Glucose 70 - 99 mg/dL 147  110  85   BUN 8 - 23 mg/dL 14  17  18   $ Creatinine 0.44 - 1.00 mg/dL 0.62  0.74  0.73   Sodium 135 - 145 mmol/L 136  138  138   Potassium 3.5 - 5.1 mmol/L 3.7  3.9  4.2   Chloride 98 - 111 mmol/L 100  100  101   CO2 22 - 32 mmol/L 26  28  29   $ Calcium 8.9 - 10.3 mg/dL 9.1  9.8  10.0   Total Protein 6.5 - 8.1 g/dL 7.1   6.8   Total Bilirubin 0.3 - 1.2 mg/dL 0.9   1.0   Alkaline Phos 38 - 126 U/L 89   95   AST 15 - 41 U/L 22   22   ALT 0 - 44 U/L 39   71     RADIOGRAPHIC STUDIES: I have personally reviewed the radiological images as listed and agreed with the findings in the report.  No results found.

## 2022-03-09 ENCOUNTER — Ambulatory Visit
Admission: RE | Admit: 2022-03-09 | Discharge: 2022-03-09 | Disposition: A | Discharge status: Home / Self Care | Payer: Medicare HMO | Source: Ambulatory Visit | Attending: Oncology | Admitting: Oncology

## 2022-03-09 DIAGNOSIS — N644 Mastodynia: Secondary | ICD-10-CM | POA: Diagnosis not present

## 2022-03-09 DIAGNOSIS — Z853 Personal history of malignant neoplasm of breast: Secondary | ICD-10-CM | POA: Insufficient documentation

## 2022-03-09 DIAGNOSIS — R928 Other abnormal and inconclusive findings on diagnostic imaging of breast: Secondary | ICD-10-CM | POA: Diagnosis not present

## 2022-03-14 ENCOUNTER — Telehealth: Payer: Self-pay

## 2022-03-14 NOTE — Telephone Encounter (Signed)
Lm for pt to cb re:  Christie Haven, MD  [name] Clinical Please let the patient know her vitamin D is little low.  Can you confirm if she is taking any vitamin D?  If she is not taking any vitamin D she should start a 1000 international units once daily.  Her A1c has improved slightly though remains in the prediabetic range.  She needs to try to exercise and monitor her diet for that.  Her other lab work is acceptable.  Thanks.

## 2022-03-20 NOTE — Telephone Encounter (Signed)
Patient called back and Dr Ellen Henri note was read. Patient states she was off of her vitamin  for 2 months, 2 weeks ago she went back on her vitamin D.

## 2022-03-23 ENCOUNTER — Other Ambulatory Visit: Payer: Self-pay

## 2022-03-23 DIAGNOSIS — E890 Postprocedural hypothyroidism: Secondary | ICD-10-CM

## 2022-03-23 MED ORDER — LEVOTHYROXINE SODIUM 137 MCG PO TABS: ORAL_TABLET | ORAL | 1 refills | Status: DC

## 2022-03-28 ENCOUNTER — Ambulatory Visit: Payer: BC Managed Care – PPO | Admitting: Dermatology

## 2022-04-06 ENCOUNTER — Telehealth: Payer: Self-pay

## 2022-04-06 MED ORDER — TRIAMCINOLONE ACETONIDE 55 MCG/ACT NA AERO: 2.0000 | INHALATION_SPRAY | Freq: Every day | NASAL | 12 refills | Status: AC

## 2022-04-06 NOTE — Telephone Encounter (Signed)
Called Patient to let her know that Fluticasone Nasal Spray had a recall. [id number]    Lot [id number]    Exp-09/2024   Phone # for questions [phone]. Patient would like [place] to call in another nasal spray for her allergies to [facility].

## 2022-04-06 NOTE — Telephone Encounter (Signed)
Called Patient to let her know Nasacort was sent to her Pharmacy

## 2022-04-06 NOTE — Telephone Encounter (Signed)
I have sent in nasacort for her.

## 2022-04-10 ENCOUNTER — Ambulatory Visit: Payer: BC Managed Care – PPO | Admitting: Dermatology

## 2022-04-20 ENCOUNTER — Ambulatory Visit: Payer: Medicare HMO | Admitting: Dermatology

## 2022-04-20 VITALS — BP 137/87 | HR 82

## 2022-04-20 DIAGNOSIS — D229 Melanocytic nevi, unspecified: Secondary | ICD-10-CM | POA: Diagnosis not present

## 2022-04-20 DIAGNOSIS — Z853 Personal history of malignant neoplasm of breast: Secondary | ICD-10-CM | POA: Diagnosis not present

## 2022-04-20 DIAGNOSIS — L821 Other seborrheic keratosis: Secondary | ICD-10-CM | POA: Diagnosis not present

## 2022-04-20 DIAGNOSIS — H02832 Dermatochalasis of right lower eyelid: Secondary | ICD-10-CM

## 2022-04-20 DIAGNOSIS — H02831 Dermatochalasis of right upper eyelid: Secondary | ICD-10-CM

## 2022-04-20 DIAGNOSIS — H02834 Dermatochalasis of left upper eyelid: Secondary | ICD-10-CM | POA: Diagnosis not present

## 2022-04-20 DIAGNOSIS — L814 Other melanin hyperpigmentation: Secondary | ICD-10-CM

## 2022-04-20 DIAGNOSIS — H02835 Dermatochalasis of left lower eyelid: Secondary | ICD-10-CM

## 2022-04-20 DIAGNOSIS — L578 Other skin changes due to chronic exposure to nonionizing radiation: Secondary | ICD-10-CM | POA: Diagnosis not present

## 2022-04-20 DIAGNOSIS — Z1283 Encounter for screening for malignant neoplasm of skin: Secondary | ICD-10-CM

## 2022-04-20 DIAGNOSIS — D1801 Hemangioma of skin and subcutaneous tissue: Secondary | ICD-10-CM

## 2022-04-20 NOTE — Progress Notes (Signed)
   Follow-Up Visit   Subjective  Christie Ramirez is a 67 y.o. female who presents for the following: Skin Cancer Screening and Full Body Skin Exam The patient presents for Total-Body Skin Exam (TBSE) for skin cancer screening and mole check. The patient has spots, moles and lesions to be evaluated, some may be new or changing and the patient has concerns that these could be cancer.  The following portions of the chart were reviewed this encounter and updated as appropriate: medications, allergies, medical history  Review of Systems:  No other skin or systemic complaints except as noted in HPI or Assessment and Plan.  Objective  Well appearing patient in no apparent distress; mood and affect are within normal limits.  A full examination was performed including scalp, head, eyes, ears, nose, lips, neck, chest, axillae, abdomen, back, buttocks, bilateral upper extremities, bilateral lower extremities, hands, feet, fingers, toes, fingernails, and toenails. All findings within normal limits unless otherwise noted below.   Relevant physical exam findings are noted in the Assessment and Plan.   Assessment & Plan   LENTIGINES, SEBORRHEIC KERATOSES, HEMANGIOMAS - Benign normal skin lesions - Benign-appearing - Call for any changes  MELANOCYTIC NEVI - Tan-brown and/or pink-flesh-colored symmetric macules and papules - Benign appearing on exam today - Observation - Call clinic for new or changing moles - Recommend daily use of broad spectrum spf 30+ sunscreen to sun-exposed areas.   ACTINIC DAMAGE - Chronic condition, secondary to cumulative UV/sun exposure - diffuse scaly erythematous macules with underlying dyspigmentation - Recommend daily broad spectrum sunscreen SPF 30+ to sun-exposed areas, reapply every 2 hours as needed.  - Staying in the shade or wearing long sleeves, sun glasses (UVA+UVB protection) and wide brim hats (4-inch brim around the entire circumference of the hat) are  also recommended for sun protection.  - Call for new or changing lesions.  Hx of breast cancer Right Breast  Observe No lymphadenopathy.    Dermatochalasis of the eyelids Alastin eye cream recommended or  Discussed blepharoplasty Dr Norlene Duel Oculofacial Plastic Surgeon and an Ophthalmologist Dr Heron Nay Oculofacial Plastic Surgeon and an Ophthalmologist  SKIN CANCER [place].   Return in about 1 year (around 04/20/2023) for TBSE.  IMarye Round, CMA, am acting as scribe for Sarina Ser, MD .   Documentation: I have reviewed the above documentation for accuracy and completeness, and I agree with the above.  Sarina Ser, MD

## 2022-04-20 NOTE — Patient Instructions (Addendum)
Alastin eye cream recommended or can see  Dr Norlene Duel Oculofacial Plastic Surgeon and an Ophthalmologist Dr Heron Nay Oculofacial Plastic Surgeon and an Ophthalmologist   Due to recent changes in healthcare laws, you may see results of your pathology and/or laboratory studies on MyChart before the doctors have had a chance to review them. We understand that in some cases there may be results that are confusing or concerning to you. Please understand that not all results are received at the same time and often the doctors may need to interpret multiple results in order to provide you with the best plan of care or course of treatment. Therefore, we ask that you please give [place] 2 business days to thoroughly review all your results before contacting the office for clarification. Should we see a critical lab result, you will be contacted sooner.   If You Need Anything After Your Visit  If you have any questions or concerns for your doctor, please call our main line at [phone] and press option 4 to reach your doctor's medical assistant. If no one answers, please leave a voicemail as directed and we will return your call as soon as possible. Messages left after 4 pm will be answered the following business day.   You may also send [place] a message via [place]. We typically respond to MyChart messages within 1-2 business days.  For prescription refills, please ask your pharmacy to contact our office. Our fax number is [phone].  If you have an urgent issue when the clinic is closed that cannot wait until the next business day, you can page your doctor at the number below.    Please note that while we do our best to be available for urgent issues outside of office hours, we are not available 24/7.   If you have an urgent issue and are unable to reach [place], you may choose to seek medical care at your doctor's office, retail clinic, urgent care center, or emergency room.  If you have a medical  emergency, please immediately call 911 or go to the emergency department.  Pager Numbers  - Dr. Nehemiah Massed: [phone]  - Dr. Laurence Ferrari: [phone]  - Dr. Nicole Kindred: [phone]  In the event of inclement weather, please call our main line at [phone] for an update on the status of any delays or closures.  Dermatology Medication Tips: Please keep the boxes that topical medications come in in order to help keep track of the instructions about where and how to use these. Pharmacies typically print the medication instructions only on the boxes and not directly on the medication tubes.   If your medication is too expensive, please contact our office at [phone] option 4 or send [place] a message through [place].   We are unable to tell what your co-pay for medications will be in advance as this is different depending on your insurance coverage. However, we may be able to find a substitute medication at lower cost or fill out paperwork to get insurance to cover a needed medication.   If a prior authorization is required to get your medication covered by your insurance company, please allow [place] 1-2 business days to complete this process.  Drug prices often vary depending on where the prescription is filled and some pharmacies may offer cheaper prices.  The website www.goodrx.com contains coupons for medications through different pharmacies. The prices here do not account for what the cost may be with help from insurance (it may be cheaper with your  insurance), but the website can give you the price if you did not use any insurance.  - You can print the associated coupon and take it with your prescription to the pharmacy.  - You may also stop by our office during regular business hours and pick up a GoodRx coupon card.  - If you need your prescription sent electronically to a different pharmacy, notify our office through [facility] or by phone at [phone] option 4.     Si Usted  Necesita Algo Despus de Su Visita  Tambin puede enviarnos un mensaje a travs de Pharmacist, community. Por lo general respondemos a los mensajes de MyChart en el transcurso de 1 a 2 das hbiles.  Para renovar recetas, por favor pida a su farmacia que se ponga en contacto con nuestra oficina. Harland Dingwall de fax es [place] [phone].  Si tiene un asunto urgente cuando la clnica est cerrada y que no puede esperar hasta el siguiente da hbil, puede llamar/localizar a su doctor(a) al nmero que aparece a continuacin.   Por favor, tenga en cuenta que aunque hacemos todo lo posible para estar disponibles para asuntos urgentes fuera del horario de [place], no estamos disponibles las 24 horas del da, los 7 das de la [place].   Si tiene un problema urgente y no puede comunicarse con nosotros, puede optar por buscar atencin mdica  en el consultorio de su doctor(a), en una clnica privada, en un centro de atencin urgente o en una sala de emergencias.  Si tiene Engineering geologist, por favor llame inmediatamente al 911 o vaya a la sala de emergencias.  Nmeros de bper  - Dr. Nehemiah Massed: [phone]  - Dra. Moye: [phone]  - Dra. Nicole Kindred: [phone]  En caso de inclemencias del [place], por favor llame a Johnsie Kindred principal al [phone] para una actualizacin sobre el [place] de cualquier retraso o cierre.  Consejos para la medicacin en dermatologa: Por favor, guarde las cajas en las que vienen los medicamentos de uso tpico para ayudarle a seguir las instrucciones sobre dnde y cmo usarlos. Las farmacias generalmente imprimen las instrucciones del medicamento slo en las cajas y no directamente en los tubos del [place].   Si su medicamento es muy caro, por favor, pngase en contacto con Zigmund Daniel llamando al [phone] y presione la opcin 4 o envenos un mensaje a travs de Pharmacist, community.   No podemos decirle cul ser su copago por los medicamentos por adelantado ya que esto es  diferente dependiendo de la cobertura de su seguro. Sin embargo, es posible que podamos encontrar un medicamento sustituto a Electrical engineer un formulario para que el seguro cubra el medicamento que se considera necesario.   Si se requiere una autorizacin previa para que su compaa de seguros [place] su medicamento, por favor permtanos de 1 a 2 das hbiles para completar este proceso.  Los precios de los medicamentos varan con frecuencia dependiendo del Environmental consultant de dnde se surte la receta y alguna farmacias pueden ofrecer precios ms baratos.  El sitio web www.goodrx.com tiene cupones para medicamentos de Airline pilot. Los precios aqu no tienen en cuenta lo que podra costar con la ayuda del seguro (puede ser ms barato con su seguro), pero el sitio web puede darle el precio si no utiliz Research scientist (physical sciences).  - Puede imprimir el cupn correspondiente y llevarlo con su receta a la farmacia.  - Tambin puede pasar por nuestra oficina durante el horario de atencin regular y Charity fundraiser una tarjeta de  de GoodRx.  - Si necesita que su receta se enve electrnicamente a una farmacia diferente, informe a nuestra oficina a travs de MyChart de [place] o por telfono llamando al [phone] y presione la opcin 4.  

## 2022-04-21 ENCOUNTER — Encounter: Payer: Self-pay | Admitting: Dermatology

## 2022-04-24 ENCOUNTER — Telehealth: Payer: Self-pay | Admitting: Family Medicine

## 2022-04-24 NOTE — Telephone Encounter (Signed)
Pt called stating she talked to the provider regarding anxiety medication. Pt want to see if she can get a medication for it

## 2022-04-25 ENCOUNTER — Ambulatory Visit
Admission: RE | Admit: 2022-04-25 | Discharge: 2022-04-25 | Disposition: A | Discharge status: Home / Self Care | Payer: Medicare HMO | Source: Ambulatory Visit | Attending: Family Medicine | Admitting: Family Medicine

## 2022-04-25 VITALS — BP 156/94 | HR 85 | Temp 97.6°F | Resp 18 | Ht 62.0 in | Wt 156.0 lb

## 2022-04-25 DIAGNOSIS — J069 Acute upper respiratory infection, unspecified: Secondary | ICD-10-CM | POA: Diagnosis not present

## 2022-04-25 MED ORDER — ESCITALOPRAM OXALATE 10 MG PO TABS: 10.0000 mg | ORAL_TABLET | Freq: Every day | ORAL | 1 refills | Status: DC

## 2022-04-25 NOTE — ED Provider Notes (Signed)
Roderic Palau    CSN: [id number] Arrival date & time: 04/25/22  1516      History   Chief Complaint Chief Complaint  Patient presents with   Sore Throat    My sinuses are really the problem- cold or allergies ? - Entered by patient    HPI Christie Ramirez is a 67 y.o. female.    Sore Throat    Presents to urgent care with complaint of symptoms starting yesterday.  She endorses ear fullness, nasal congestion, sinus pressure.  Denies sore throat, chills, fever.  Patient previously treated with fluticasone nasal spray but concerned about recent recall ("taking off the market").  Her provider did prescribe an alternative Nasacort which she has not yet picked up.  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   Atherosclerosis of abdominal aorta 11/26/2019   Stable angina pectoris 11/26/2019   H/O ongoing treatment with raloxifene 11/07/2019   Vitamin D deficiency 12/12/2018   History of thyroid cancer 10/19/2018   History of breast cancer 10/19/2018   Left shoulder pain 08/25/2018   Pain of left calf 08/25/2018   Lipoma of thigh 03/21/2018   Prediabetes 11/30/2017   Osteoporosis 08/10/2017   Carotid artery plaque 03/05/2017   Rectal bleeding 02/03/2017   Carpal tunnel syndrome 02/03/2017   GERD (gastroesophageal reflux disease) 02/03/2017   Osteopenia 08/11/2015    History of herpes genitalis 09/23/2014   OSA (obstructive sleep apnea) 09/19/2013   Insomnia 06/09/2013   Hyperlipidemia with target low density lipoprotein (LDL) cholesterol less than 100 mg/dL 06/26/2011   Hypertension 05/18/2011   Anxiety and depression 05/18/2011   Hypothyroidism 10/12/2010    Past Surgical History:  Procedure Laterality Date   ABDOMINAL HYSTERECTOMY  2005   Martin DeFrancesco,MD   BREAST BIOPSY Right 04/02/2013   INVASIVE MAMMARY CARCINOMA WITH FEATURES OF TUBULAR CARCINOMA,   BREAST CYST EXCISION Left 12/02/2018   Procedure: EXCISION LEFT BREAST SEBACEOUS CYST;  Surgeon: Jovita Kussmaul, MD;  Location: [facility];  Service: General;  Laterality: Left;   BREAST LUMPECTOMY Right 04/2013   INVASIVE MAMMARY CARCINOMA WITH FEATURES OF TUBULAR CARCINOMA,   BREAST SURGERY Right 05/01/2013   wide excision   colectomy  2009   secondary to endometriosis DR. [place] SURGERY  12/2006   right hemicolectomy for cecal mass identified as endometrioma. No malignancy.   COLONOSCOPY  2009   Dr. Tamala Julian   COLONOSCOPY WITH PROPOFOL N/A 11/19/2018   Procedure: COLONOSCOPY WITH PROPOFOL;  Surgeon: Lucilla Lame, MD;  [facility] ENDOSCOPY;  Service: Endoscopy;  Laterality: N/A;   FOOT SURGERY  right   MASTOPEXY Bilateral 01/03/2019   Procedure: MASTOPEXY;  Surgeon: Irene Limbo, MD;  Location: [facility];  Service: Plastics;  Laterality: Bilateral;  REDUCTION MAMMAPLASTY      OB History     Gravida  1   Para  1   Term      Preterm  1   AB      Living         SAB      IAB      Ectopic      Multiple      Live Births               Home Medications    Prior to Admission medications   Medication Sig Start Date End Date Taking? Authorizing Provider  albuterol (VENTOLIN HFA) 108 (90 Base) MCG/ACT inhaler Inhale 2 puffs into the lungs every 6 (six) hours as needed for wheezing or shortness of breath. 12/05/19    [name], NP  amLODipine (NORVASC) 5 MG tablet TAKE 2 TABLETS (10 MG TOTAL) BY MOUTH DAILY. 06/27/21 03/24/22  Leone Haven, MD  aspirin 81 MG tablet Take 81 mg by mouth daily.    [provider]  atorvastatin (LIPITOR) 40 MG tablet TAKE ONE TABLET BY MOUTH DAILY 11/08/21   Leone Haven, MD  cetirizine (ZYRTEC) 10 MG tablet SMARTSIG:1 Pill By Mouth Daily PRN 03/07/22   Leone Haven, MD  cyclobenzaprine (FLEXERIL) 10 MG tablet Take 1 tablet (10 mg total) by mouth 3 (three) times daily as needed for muscle spasms. 07/26/20   Rudene Re, MD  escitalopram (LEXAPRO) 10 MG tablet Take 1 tablet (10 mg total) by mouth daily. 04/25/22   Leone Haven, MD  gabapentin (NEURONTIN) 100 MG capsule TAKE ONE CAPSULE BY MOUTH THREE TIMES A DAY 12/09/20   Edrick Kins, DPM  GARLIC PO Take 0000000 mg by mouth.    [provider]  levothyroxine (SYNTHROID) 137 MCG tablet TAKE ONE TABLET BY MOUTH DAILY BEFORE BREAKFAST 03/23/22   Leone Haven, MD  losartan (COZAAR) 100 MG tablet TAKE ONE TABLET BY MOUTH DAILY 11/28/21   Leone Haven, MD  Multiple Vitamins-Minerals (MULTIVITAMIN WITH MINERALS) tablet Take 1 tablet by mouth daily.    [provider]  traZODone (DESYREL) 50 MG tablet TAKE TWO TABLETS BY MOUTH ONCE NIGHTLY AT BEDTIME AS NEEDED FOR SLEEP 12/14/21   Leone Haven, MD  triamcinolone (NASACORT) 55 MCG/ACT AERO nasal inhaler Place 2 sprays into the nose daily. 04/06/22   Leone Haven, MD  vitamin C (ASCORBIC ACID) 500 MG tablet Take 500 mg by mouth daily.    [provider]    Family History Family History  Problem Relation Age of Onset   Colon polyps Father        age 41   Heart disease Father    Bone cancer Father    Heart disease Mother    Rheumatic fever Mother    Heart disease Paternal Grandmother    Heart disease Paternal Grandfather    Cancer Neg Hx    Diabetes Neg Hx    Breast cancer Neg Hx     Social  History Social History   Tobacco Use   Smoking status: Never   Smokeless tobacco: Never  Vaping Use   Vaping Use: Never used  Substance Use Topics   Alcohol use: Not Currently    Alcohol/week: 0.0 standard drinks of alcohol    Comment: wine socially   Drug use: No     Allergies   Codeine, Iodinated contrast media, Iodine, and Penicillins   Review of Systems Review  of Systems   Physical Exam Triage Vital Signs ED Triage Vitals  Enc Vitals Group     BP 04/25/22 1524 (!) 156/94     Pulse Rate 04/25/22 1524 85     Resp 04/25/22 1524 18     Temp 04/25/22 1524 97.6 F (36.4 C)     Temp src --      SpO2 04/25/22 1524 95 %     Weight 04/25/22 1528 156 lb (70.8 kg)     Height 04/25/22 1528 5\' 2"  (1.575 m)     Head Circumference --      Peak Flow --      Pain Score 04/25/22 1527 8     Pain Loc --      Pain Edu? --      Excl. in [place]? --    No data found.  Updated Vital Signs BP (!) 156/94   Pulse 85   Temp 97.6 F (36.4 C)   Resp 18   Ht 5\' 2"  (1.575 m)   Wt 156 lb (70.8 kg)   SpO2 95%   BMI 28.53 kg/m   Visual Acuity Right Eye Distance:   Left Eye Distance:   Bilateral Distance:    Right Eye Near:   Left Eye Near:    Bilateral Near:     Physical Exam Vitals reviewed.  Constitutional:      Appearance: She is well-developed. She is not ill-appearing.  Cardiovascular:     Rate and Rhythm: Normal rate and regular rhythm.     Heart sounds: Normal heart sounds.  Pulmonary:     Effort: Pulmonary effort is normal.     Breath sounds: Normal breath sounds.  Skin:    General: Skin is warm and dry.  Neurological:     General: No focal deficit present.     Mental Status: She is alert and oriented to person, place, and time.  Psychiatric:        Mood and Affect: Mood normal.        Behavior: Behavior normal.      UC Treatments / Results  Labs (all labs ordered are listed, but only abnormal results are displayed) Labs Reviewed - No data to  display  EKG   Radiology No results found.  Procedures Procedures (including critical care time)  Medications Ordered in UC Medications - No data to display  Initial Impression / Assessment and Plan / UC Course  I have reviewed the triage vital signs and the nursing notes.  Pertinent labs & imaging results that were available during my care of the patient were reviewed by me and considered in my medical decision making (see chart for details).   Patient is afebrile here without recent antipyretics. Satting well on room air. Overall is well appearing, well hydrated, without respiratory distress. Pulmonary exam is unremarkable.  Lungs CTAB without wheezing, rhonchi, rales.  Patient's symptoms are consistent with an acute viral process.  No fever and no indication for flu/COVID testing.  Given the mildness of the symptoms, will recommend supportive care and use of OTC medication for symptom control including nasal steroid spray such as the Nasacort her PCP recommended.  Patient acknowledges understanding and agreement of this treatment plan  Final Clinical Impressions(s) / UC Diagnoses   Final diagnoses:  None   Discharge Instructions   None    ED Prescriptions   None    PDMP not reviewed this encounter.   Rose Phi, [place] 04/25/22 1559

## 2022-04-25 NOTE — Telephone Encounter (Signed)
Error

## 2022-04-25 NOTE — Discharge Instructions (Signed)
Follow up here or with your primary care provider if your symptoms are worsening or not improving.     

## 2022-04-25 NOTE — Telephone Encounter (Signed)
I sent Lexapro in for her to restart.  She needs follow-up with me in about 2 months to check on this.  Please find out what specific anxiety symptoms she is having now.  Please see if she is feeling depressed at all as well.  Thanks.

## 2022-04-25 NOTE — ED Triage Notes (Signed)
Patient to Urgent Care with complaints of sore throat/ sinus pain and pressure/ nasal congestion that started yesterday. Denies any known fevers.  Has been taking otc allergy medications without any relief.

## 2022-04-26 NOTE — Telephone Encounter (Signed)
LMTCB

## 2022-04-26 NOTE — Telephone Encounter (Signed)
Left message to call back so I can give her Dr. Ellen Henri message and do a anxiety & depression screening with her

## 2022-04-27 NOTE — Telephone Encounter (Signed)
I also sent a mychart message to patient to schedule a 2 mth f/u & to notify her that the Lexapro was sent in,.  I also attached the depression & anxiety screening to mychart message for completion.

## 2022-05-02 ENCOUNTER — Other Ambulatory Visit: Payer: Self-pay

## 2022-05-02 ENCOUNTER — Encounter: Payer: Self-pay | Admitting: Nurse Practitioner

## 2022-05-02 ENCOUNTER — Ambulatory Visit (INDEPENDENT_AMBULATORY_CARE_PROVIDER_SITE_OTHER): Payer: Medicare HMO | Admitting: Nurse Practitioner

## 2022-05-02 VITALS — BP 128/84 | HR 92 | Temp 97.7°F | Ht 62.0 in | Wt 156.0 lb

## 2022-05-02 DIAGNOSIS — K921 Melena: Secondary | ICD-10-CM | POA: Insufficient documentation

## 2022-05-02 DIAGNOSIS — K625 Hemorrhage of anus and rectum: Secondary | ICD-10-CM | POA: Diagnosis not present

## 2022-05-02 MED ORDER — ATORVASTATIN CALCIUM 40 MG PO TABS: 40.0000 mg | ORAL_TABLET | Freq: Every day | ORAL | 1 refills | Status: DC

## 2022-05-02 NOTE — Telephone Encounter (Signed)
Prescription Request  05/02/2022  LOV: Visit date not found  What is the name of the medication or equipment?  atorvastatin (LIPITOR) 40 MG tablet   Have you contacted your pharmacy to request a refill? Yes   Which pharmacy would you like this sent to?  [id number] [place], [place] - [address] [place] [address] Phone: [phone] Fax: [phone]   Patient notified that their request is being sent to the clinical staff for review and that they should receive a response within 2 business days.   Please advise at Mobile [phone] (mobile)

## 2022-05-02 NOTE — Assessment & Plan Note (Addendum)
Will refer back to GI for further evaluation and Colonoscopy. Return precautions given to patient.

## 2022-05-02 NOTE — Progress Notes (Signed)
Bethanie Dicker, NP-C Phone: [phone]  Christie Ramirez is a 67 y.o. female who presents today for blood in her stool.  Patient reports noticing blood in her stool approximately one month ago, it lasted for 5 days then resolved. She reports it started again over the weekend. She does not notice it every time she has a bowel movement. She has noticed bright red mucus in the toilet as well as blood when wiping. She reports intermittent problems with constipation. She denies diarrhea. She does have alternating constipation and soft stools. Denies any abdominal pains. Denies fevers. Denies weight loss. She had a Colonoscopy in 2020.   Social History   Tobacco Use  Smoking Status Never  Smokeless Tobacco Never    Current Outpatient Medications on File Prior to Visit  Medication Sig Dispense Refill   albuterol (VENTOLIN HFA) 108 (90 Base) MCG/ACT inhaler Inhale 2 puffs into the lungs every 6 (six) hours as needed for wheezing or shortness of breath. 1 each 1   amLODipine (NORVASC) 5 MG tablet TAKE 2 TABLETS (10 MG TOTAL) BY MOUTH DAILY. 60 tablet 1   aspirin 81 MG tablet Take 81 mg by mouth daily.     atorvastatin (LIPITOR) 40 MG tablet TAKE ONE TABLET BY MOUTH DAILY 90 tablet 0   cetirizine (ZYRTEC) 10 MG tablet SMARTSIG:1 Pill By Mouth Daily PRN 90 tablet 3   cyclobenzaprine (FLEXERIL) 10 MG tablet Take 1 tablet (10 mg total) by mouth 3 (three) times daily as needed for muscle spasms. 30 tablet 0   escitalopram (LEXAPRO) 10 MG tablet Take 1 tablet (10 mg total) by mouth daily. 90 tablet 1   gabapentin (NEURONTIN) 100 MG capsule TAKE ONE CAPSULE BY MOUTH THREE TIMES A DAY (Patient not taking: Reported on 05/02/2022) 90 capsule 1   GARLIC PO Take 283 mg by mouth. (Patient not taking: Reported on 05/02/2022)     levothyroxine (SYNTHROID) 137 MCG tablet TAKE ONE TABLET BY MOUTH DAILY BEFORE BREAKFAST 90 tablet 1   losartan (COZAAR) 100 MG tablet TAKE ONE TABLET BY MOUTH DAILY 90 tablet 1   Multiple  Vitamins-Minerals (MULTIVITAMIN WITH MINERALS) tablet Take 1 tablet by mouth daily.     traZODone (DESYREL) 50 MG tablet TAKE TWO TABLETS BY MOUTH ONCE NIGHTLY AT BEDTIME AS NEEDED FOR SLEEP 180 tablet 1   triamcinolone (NASACORT) 55 MCG/ACT AERO nasal inhaler Place 2 sprays into the nose daily. 1 each 12   vitamin C (ASCORBIC ACID) 500 MG tablet Take 500 mg by mouth daily.     Current Facility-Administered Medications on File Prior to Visit  Medication Dose Route Frequency Provider Last Rate Last Admin   betamethasone acetate-betamethasone sodium phosphate (CELESTONE) injection 3 mg  3 mg Intra-articular Once Felecia Shelling, DPM        ROS see history of present illness  Objective  Physical Exam Vitals:   05/02/22 0827  BP: 128/84  Pulse: 92  Temp: 97.7 F (36.5 C)  SpO2: 95%    BP Readings from Last 3 Encounters:  05/02/22 128/84  04/25/22 (!) 156/94  04/20/22 137/87   Wt Readings from Last 3 Encounters:  05/02/22 156 lb (70.8 kg)  04/25/22 156 lb (70.8 kg)  03/08/22 155 lb 9.6 oz (70.6 kg)    Physical Exam Constitutional:      General: She is not in acute distress.    Appearance: Normal appearance.  HENT:     Head: Normocephalic.  Cardiovascular:     Rate and Rhythm: Normal rate  and regular rhythm.     Heart sounds: Normal heart sounds.  Pulmonary:     Effort: Pulmonary effort is normal.     Breath sounds: Normal breath sounds.  Abdominal:     General: Abdomen is flat. Bowel sounds are normal. There is no distension.     Palpations: Abdomen is soft.     Tenderness: There is no abdominal tenderness.  Skin:    General: Skin is warm and dry.  Neurological:     General: No focal deficit present.     Mental Status: She is alert.  Psychiatric:        Mood and Affect: Mood normal.        Behavior: Behavior normal.    Assessment/Plan: Please see individual problem list.  Rectal bleeding Assessment & Plan: Will refer back to GI for further evaluation and  Colonoscopy. Return precautions given to patient.   Orders: -     Ambulatory referral to Gastroenterology  Hematochezia -     Ambulatory referral to Gastroenterology    Return if symptoms worsen or fail to improve.   Bethanie Dicker, NP-C [facility] - [employer]

## 2022-05-04 NOTE — Addendum Note (Signed)
Addended by: Bethanie Dicker on: 05/04/2022 11:37 AM   Modules accepted: Orders

## 2022-05-05 ENCOUNTER — Telehealth: Payer: Self-pay | Admitting: Family Medicine

## 2022-05-05 ENCOUNTER — Telehealth: Payer: Self-pay

## 2022-05-05 NOTE — Telephone Encounter (Signed)
LMTCB to let her know the referral was put in with [facility] gastro- Dr. Merlyn Lot -[phone]

## 2022-05-05 NOTE — Telephone Encounter (Signed)
Patient called and wanted to know if her colonoscopy was scheduled. Please call her.

## 2022-05-05 NOTE — Telephone Encounter (Signed)
LMTCB to let her know the referral was put in with [facility] gastro- Dr. Trevor Locklear -[phone]   

## 2022-05-05 NOTE — Telephone Encounter (Signed)
Pt called back and I read the message to her and she stated Thank you

## 2022-05-19 DIAGNOSIS — G4733 Obstructive sleep apnea (adult) (pediatric): Secondary | ICD-10-CM | POA: Diagnosis not present

## 2022-05-19 DIAGNOSIS — I6523 Occlusion and stenosis of bilateral carotid arteries: Secondary | ICD-10-CM | POA: Diagnosis not present

## 2022-05-19 DIAGNOSIS — E785 Hyperlipidemia, unspecified: Secondary | ICD-10-CM | POA: Diagnosis not present

## 2022-05-19 DIAGNOSIS — I10 Essential (primary) hypertension: Secondary | ICD-10-CM | POA: Diagnosis not present

## 2022-05-19 DIAGNOSIS — R002 Palpitations: Secondary | ICD-10-CM | POA: Diagnosis not present

## 2022-05-28 NOTE — Progress Notes (Unsigned)
    Christie Colella T. Dorine Duffey, MD, CAQ Sports Medicine [facility] [address] [place], [address]  Phone: [phone]  FAX: [phone]  Christie Ramirez - 67 y.o. female  MRN [id number]  Date of Birth: 1955/10/31  Date: 05/29/2022  PCP: Glori Luis, MD  Referral: Glori Luis, MD  No chief complaint on file.  Subjective:   Christie Ramirez is a 67 y.o. very pleasant female patient with There is no height or weight on file to calculate BMI. who presents with the following:  Patient presents with left-sided shoulder pain.    Review of Systems is noted in the HPI, as appropriate  Objective:   There were no vitals taken for this visit.  GEN: No acute distress; alert,appropriate. PULM: Breathing comfortably in no respiratory distress PSYCH: Normally interactive.   Laboratory and Imaging Data:  Assessment and Plan:   ***

## 2022-05-29 ENCOUNTER — Encounter: Payer: Self-pay | Admitting: Family Medicine

## 2022-05-29 ENCOUNTER — Ambulatory Visit (INDEPENDENT_AMBULATORY_CARE_PROVIDER_SITE_OTHER): Payer: Medicare HMO | Admitting: Family Medicine

## 2022-05-29 ENCOUNTER — Ambulatory Visit (INDEPENDENT_AMBULATORY_CARE_PROVIDER_SITE_OTHER)
Admission: RE | Admit: 2022-05-29 | Discharge: 2022-05-29 | Disposition: A | Discharge status: Home / Self Care | Payer: Medicare HMO | Source: Ambulatory Visit | Attending: Family Medicine | Admitting: Family Medicine

## 2022-05-29 VITALS — BP 138/84 | HR 72 | Temp 97.8°F | Ht 60.75 in | Wt 156.5 lb

## 2022-05-29 DIAGNOSIS — M25512 Pain in left shoulder: Secondary | ICD-10-CM

## 2022-05-29 DIAGNOSIS — G8929 Other chronic pain: Secondary | ICD-10-CM | POA: Diagnosis not present

## 2022-05-29 DIAGNOSIS — M19012 Primary osteoarthritis, left shoulder: Secondary | ICD-10-CM | POA: Diagnosis not present

## 2022-05-29 DIAGNOSIS — M7532 Calcific tendinitis of left shoulder: Secondary | ICD-10-CM

## 2022-05-29 MED ORDER — TRIAMCINOLONE ACETONIDE 40 MG/ML IJ SUSP
40.0000 mg | Freq: Once | INTRAMUSCULAR | Status: AC
  Administered 2022-05-29: 40 mg via INTRA_ARTICULAR

## 2022-05-30 ENCOUNTER — Encounter: Payer: Self-pay | Admitting: Family Medicine

## 2022-06-01 ENCOUNTER — Other Ambulatory Visit: Payer: Self-pay | Admitting: Family Medicine

## 2022-06-01 DIAGNOSIS — I10 Essential (primary) hypertension: Secondary | ICD-10-CM

## 2022-06-07 DIAGNOSIS — M25512 Pain in left shoulder: Secondary | ICD-10-CM | POA: Diagnosis not present

## 2022-06-10 ENCOUNTER — Other Ambulatory Visit: Payer: Self-pay | Admitting: Family Medicine

## 2022-06-10 DIAGNOSIS — G4700 Insomnia, unspecified: Secondary | ICD-10-CM

## 2022-06-20 ENCOUNTER — Other Ambulatory Visit: Payer: Medicare HMO

## 2022-06-28 DIAGNOSIS — M7532 Calcific tendinitis of left shoulder: Secondary | ICD-10-CM | POA: Diagnosis not present

## 2022-06-28 DIAGNOSIS — M25512 Pain in left shoulder: Secondary | ICD-10-CM | POA: Diagnosis not present

## 2022-07-17 ENCOUNTER — Ambulatory Visit: Payer: Medicare HMO | Admitting: Family Medicine

## 2022-07-25 ENCOUNTER — Ambulatory Visit
Admission: EM | Admit: 2022-07-25 | Discharge: 2022-07-25 | Disposition: A | Discharge status: Home / Self Care | Payer: Medicare HMO

## 2022-07-25 DIAGNOSIS — Z91038 Other insect allergy status: Secondary | ICD-10-CM

## 2022-07-25 NOTE — ED Triage Notes (Signed)
Patient presents to UC for insect bites x 1 day located underneath breast area. Has applied triple antibiotic ointment. 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

## 2022-07-25 NOTE — Discharge Instructions (Addendum)
Follow up here or with your primary care provider if your symptoms are worsening or not improving.     

## 2022-07-25 NOTE — ED Provider Notes (Signed)
Renaldo Fiddler    CSN: [id number] Arrival date & time: 07/25/22  1754      History   Chief Complaint Chief Complaint  Patient presents with   Insect Bite    HPI Christie Ramirez is a 67 y.o. female.   HPI  See UC with complaint of insect bite x 1 day.  Bites are located underneath her breast area.  She is applying triple antibiotic ointment.  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   Atherosclerosis of abdominal aorta (HCC) 11/26/2019   Stable angina pectoris 11/26/2019   H/O ongoing treatment with raloxifene 11/07/2019   Vitamin D deficiency 12/12/2018   History of thyroid cancer 10/19/2018   History of breast cancer 10/19/2018   Left shoulder pain 08/25/2018   Pain of left calf 08/25/2018   Lipoma of thigh 03/21/2018   Prediabetes 11/30/2017   Osteoporosis 08/10/2017   Carotid artery plaque 03/05/2017   Rectal bleeding 02/03/2017   Carpal tunnel syndrome 02/03/2017   GERD (gastroesophageal reflux disease) 02/03/2017   Osteopenia 08/11/2015   History of herpes genitalis 09/23/2014   OSA (obstructive sleep apnea) 09/19/2013   Insomnia 06/09/2013   Hyperlipidemia with target low density  lipoprotein (LDL) cholesterol less than 100 mg/dL 30/16/0109   Hypertension 05/18/2011   Anxiety and depression 05/18/2011   Hypothyroidism 10/12/2010    Past Surgical History:  Procedure Laterality Date   ABDOMINAL HYSTERECTOMY  2005   Martin DeFrancesco,MD   BREAST BIOPSY Right 04/02/2013   INVASIVE MAMMARY CARCINOMA WITH FEATURES OF TUBULAR CARCINOMA,   BREAST CYST EXCISION Left 12/02/2018   Procedure: EXCISION LEFT BREAST SEBACEOUS CYST;  Surgeon: Griselda Miner, MD;  Location: [facility];  Service: General;  Laterality: Left;   BREAST LUMPECTOMY Right 04/2013   INVASIVE MAMMARY CARCINOMA WITH FEATURES OF TUBULAR CARCINOMA,   BREAST SURGERY Right 05/01/2013   wide excision   colectomy  2009   secondary to endometriosis DR. Katrinka Blazing   COLON SURGERY  12/2006   right hemicolectomy for cecal mass identified as endometrioma. No malignancy.   COLONOSCOPY  2009   Dr. Katrinka Blazing   COLONOSCOPY WITH PROPOFOL N/A 11/19/2018   Procedure: COLONOSCOPY WITH PROPOFOL;  Surgeon: Midge Minium, MD;  Location[facility];  Service: Endoscopy;  Laterality: N/A;   FOOT SURGERY  right   MASTOPEXY Bilateral 01/03/2019   Procedure: MASTOPEXY;  Surgeon: Glenna Fellows, MD;  Location: [facility];  Service: Plastics;  Laterality: Bilateral;   REDUCTION MAMMAPLASTY      OB History     Gravida  1   Para  1   Term      Preterm  1   AB      Living         SAB      IAB      Ectopic      Multiple      Live Births               Home Medications    Prior to Admission medications   Medication Sig Start Date End Date Taking? Authorizing Provider  albuterol (VENTOLIN HFA) 108 (90 Base) MCG/ACT inhaler Inhale 2 puffs into the lungs every 6 (six) hours as needed for wheezing or shortness of breath. 12/05/19   Theadore Nan, NP  amLODipine (NORVASC) 5 MG tablet TAKE 2 TABLETS (10 MG TOTAL) BY MOUTH DAILY. 06/27/21 03/24/22  Glori Luis, MD  aspirin  81 MG tablet Take 81 mg by mouth daily.    [provider]  atorvastatin (LIPITOR) 40 MG tablet Take 1 tablet (40 mg total) by mouth daily. 05/02/22   Glori Luis, MD  cetirizine (ZYRTEC) 10 MG tablet SMARTSIG:1 Pill By Mouth Daily PRN 03/07/22   Glori Luis, MD  cyclobenzaprine (FLEXERIL) 10 MG tablet Take 1 tablet (10 mg total) by mouth 3 (three) times daily as needed for muscle spasms. 07/26/20   Nita Sickle, MD  escitalopram (LEXAPRO) 10 MG tablet Take 1 tablet (10 mg total) by mouth daily. 04/25/22   Glori Luis, MD  levothyroxine (SYNTHROID) 137 MCG tablet TAKE ONE TABLET BY MOUTH DAILY BEFORE BREAKFAST 03/23/22   Glori Luis, MD  losartan (COZAAR) 100 MG tablet TAKE 1 TABLET BY MOUTH DAILY 06/01/22   Glori Luis, MD  Multiple Vitamins-Minerals (MULTIVITAMIN WITH MINERALS) tablet Take 1 tablet by mouth daily.    [provider]  traZODone (DESYREL) 50 MG tablet TAKE TWO TABLETS BY MOUTH AT BEDTIME AS NEEDED FOR SLEEP 06/13/22   Glori Luis, MD  triamcinolone (NASACORT) 55 MCG/ACT AERO nasal inhaler Place 2 sprays into the nose daily. 04/06/22   Glori Luis, MD  vitamin C (ASCORBIC ACID) 500 MG tablet Take 500 mg by mouth daily.    [provider]    Family History Family History  Problem Relation Age of Onset   Colon polyps Father        age 62   Heart disease Father    Bone cancer Father    Heart disease Mother    Rheumatic fever Mother    Heart disease Paternal Grandmother    Heart disease Paternal Grandfather    Cancer Neg Hx    Diabetes Neg Hx    Breast cancer Neg Hx     Social History Social History   Tobacco Use   Smoking status: Never   Smokeless tobacco: Never  Vaping Use   Vaping Use: Never used  Substance Use Topics   Alcohol use: Not Currently    Alcohol/week: 0.0 standard drinks of alcohol    Comment: wine socially   Drug use: No     Allergies   Codeine, Iodinated contrast media,  Iodine, and Penicillins   Review of Systems Review of Systems   Physical Exam Triage Vital Signs ED Triage Vitals  Enc Vitals Group     BP 07/25/22 1803 134/88     Pulse Rate 07/25/22 1803 94     Resp 07/25/22 1803 16     Temp 07/25/22 1803 97.8 F (36.6 C)  Temp Source 07/25/22 1803 Temporal     SpO2 07/25/22 1803 94 %     Weight --      Height --      Head Circumference --      Peak Flow --      Pain Score 07/25/22 1757 0     Pain Loc --      Pain Edu? --      Excl. in GC? --    No data found.  Updated Vital Signs BP 134/88 (BP Location: Left Arm)   Pulse 94   Temp 97.8 F (36.6 C) (Temporal)   Resp 16   SpO2 94%   Visual Acuity Right Eye Distance:   Left Eye Distance:   Bilateral Distance:    Right Eye Near:   Left Eye Near:    Bilateral Near:     Physical Exam Vitals reviewed.  Constitutional:      Appearance: Normal appearance.  Skin:    General: Skin is warm and dry.     Comments: Multiple insect bites distributed on her body including groin, left side under her bra, right axilla.  Local inflammatory reaction at each location.  Neurological:     General: No focal deficit present.     Mental Status: She is alert and oriented to person, place, and time.  Psychiatric:        Mood and Affect: Mood normal.        Behavior: Behavior normal.      UC Treatments / Results  Labs (all labs ordered are listed, but only abnormal results are displayed) Labs Reviewed - No data to display  EKG   Radiology No results found.  Procedures Procedures (including critical care time)  Medications Ordered in UC Medications - No data to display  Initial Impression / Assessment and Plan / UC Course  I have reviewed the triage vital signs and the nursing notes.  Pertinent labs & imaging results that were available during my care of the patient were reviewed by me and considered in my medical decision making (see chart for details).   Stuti Grzesiak  is a 67 y.o. female presenting with insect bites. Patient is afebrile without recent antipyretics, satting well on room air. Overall is well appearing though non-toxic, well hydrated, without respiratory distress. Multiple insect bites distributed on her body including groin, left side under her bra, right axilla.  Erythema at each location..  Reviewed relevant chart history.   Local inflammatory reaction from insect bites of multiple locations.  Unlikely parasitic infestation given presentation.  More likely suspect mosquito or, given her dogs sitting situation, fleas.  Recommended use of antihistamines or topical corticosteroids to reduce pruritus.  Counseled patient on potential for adverse effects with medications prescribed/recommended today, ER and return-to-clinic precautions discussed, patient verbalized understanding and agreement with care plan.  Final Clinical Impressions(s) / UC Diagnoses   Final diagnoses:  None   Discharge Instructions   None    ED Prescriptions   None    PDMP not reviewed this encounter.   Charma Igo, [place] 07/25/22 1821

## 2022-08-15 NOTE — Telephone Encounter (Signed)
Noted  

## 2022-08-15 NOTE — Telephone Encounter (Signed)
Pt called in asking the name of the doctor that [facility] referred to. I provided her with note below from [facility]. Pt aware and understood.

## 2022-08-21 DIAGNOSIS — K219 Gastro-esophageal reflux disease without esophagitis: Secondary | ICD-10-CM | POA: Diagnosis not present

## 2022-08-21 DIAGNOSIS — R110 Nausea: Secondary | ICD-10-CM | POA: Diagnosis not present

## 2022-08-21 DIAGNOSIS — R194 Change in bowel habit: Secondary | ICD-10-CM | POA: Diagnosis not present

## 2022-08-21 DIAGNOSIS — K625 Hemorrhage of anus and rectum: Secondary | ICD-10-CM | POA: Diagnosis not present

## 2022-08-23 ENCOUNTER — Encounter (INDEPENDENT_AMBULATORY_CARE_PROVIDER_SITE_OTHER): Payer: Self-pay

## 2022-09-01 ENCOUNTER — Telehealth: Payer: Self-pay | Admitting: Family Medicine

## 2022-09-01 NOTE — Telephone Encounter (Signed)
Copied from CRM [id number]. Topic: Medicare AWV >> Sep 01, 2022  9:15 AM Payton Doughty wrote: Reason for CRM: LM 09/01/2022 to schedule AWV   Verlee Rossetti; Care Guide Ambulatory Clinical Support [facility] Direct Dial: [phone]

## 2022-09-06 ENCOUNTER — Ambulatory Visit (INDEPENDENT_AMBULATORY_CARE_PROVIDER_SITE_OTHER): Payer: Medicare HMO | Admitting: Family Medicine

## 2022-09-06 ENCOUNTER — Encounter: Payer: Self-pay | Admitting: Family Medicine

## 2022-09-06 VITALS — BP 122/70 | HR 63 | Temp 97.7°F | Ht 60.75 in | Wt 150.4 lb

## 2022-09-06 DIAGNOSIS — R7303 Prediabetes: Secondary | ICD-10-CM

## 2022-09-06 DIAGNOSIS — E890 Postprocedural hypothyroidism: Secondary | ICD-10-CM

## 2022-09-06 DIAGNOSIS — M542 Cervicalgia: Secondary | ICD-10-CM | POA: Diagnosis not present

## 2022-09-06 DIAGNOSIS — E785 Hyperlipidemia, unspecified: Secondary | ICD-10-CM

## 2022-09-06 DIAGNOSIS — I10 Essential (primary) hypertension: Secondary | ICD-10-CM | POA: Diagnosis not present

## 2022-09-06 DIAGNOSIS — E559 Vitamin D deficiency, unspecified: Secondary | ICD-10-CM | POA: Diagnosis not present

## 2022-09-06 DIAGNOSIS — I700 Atherosclerosis of aorta: Secondary | ICD-10-CM

## 2022-09-06 DIAGNOSIS — Z0000 Encounter for general adult medical examination without abnormal findings: Secondary | ICD-10-CM

## 2022-09-06 DIAGNOSIS — Z0001 Encounter for general adult medical examination with abnormal findings: Secondary | ICD-10-CM

## 2022-09-06 LAB — LIPID PANEL
Cholesterol: 147 mg/dL (ref 0–200)
HDL: 42.3 mg/dL (ref >39.00)
LDL Cholesterol: 66 mg/dL (ref 0–99)
NonHDL: 104.63
Total CHOL/HDL Ratio: 3
Triglycerides: 194 mg/dL — ABNORMAL HIGH (ref 0.0–149.0)
VLDL: 38.8 mg/dL (ref 0.0–40.0)

## 2022-09-06 LAB — COMPREHENSIVE METABOLIC PANEL
ALT: 39 U/L — ABNORMAL HIGH (ref 0–35)
AST: 21 U/L (ref 0–37)
Albumin: 4.5 g/dL (ref 3.5–5.2)
Alkaline Phosphatase: 75 U/L (ref 39–117)
BUN: 17 mg/dL (ref 6–23)
CO2: 27 mEq/L (ref 19–32)
Calcium: 9.6 mg/dL (ref 8.4–10.5)
Chloride: 100 mEq/L (ref 96–112)
Creatinine, Ser: 0.65 mg/dL (ref 0.40–1.20)
GFR: 92.02 mL/min (ref >60.00)
Glucose, Bld: 118 mg/dL — ABNORMAL HIGH (ref 70–99)
Potassium: 4 mEq/L (ref 3.5–5.1)
Sodium: 136 mEq/L (ref 135–145)
Total Bilirubin: 0.7 mg/dL (ref 0.2–1.2)
Total Protein: 6.6 g/dL (ref 6.0–8.3)

## 2022-09-06 LAB — TSH: TSH: 0.49 u[IU]/mL (ref 0.35–5.50)

## 2022-09-06 LAB — HEMOGLOBIN A1C: Hgb A1c MFr Bld: 5.9 % (ref 4.6–6.5)

## 2022-09-06 LAB — VITAMIN D 25 HYDROXY (VIT D DEFICIENCY, FRACTURES): VITD: 37.72 ng/mL (ref 30.00–100.00)

## 2022-09-06 MED ORDER — AMLODIPINE BESYLATE 5 MG PO TABS
10.0000 mg | ORAL_TABLET | Freq: Every day | ORAL | 1 refills | Status: DC
Start: 2022-09-06 — End: 2022-10-11

## 2022-09-06 MED ORDER — ESCITALOPRAM OXALATE 10 MG PO TABS: 10.0000 mg | ORAL_TABLET | Freq: Every day | ORAL | 1 refills | Status: DC

## 2022-09-06 MED ORDER — LEVOTHYROXINE SODIUM 137 MCG PO TABS
ORAL_TABLET | ORAL | 1 refills | Status: DC
Start: 2022-09-06 — End: 2023-04-11

## 2022-09-06 MED ORDER — ATORVASTATIN CALCIUM 40 MG PO TABS: 40.0000 mg | ORAL_TABLET | Freq: Every day | ORAL | 1 refills | Status: DC
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
## 2022-09-06 NOTE — Assessment & Plan Note (Signed)
Patient with anterior neck pain that could represent a salivary gland issue, inflamed lymph node, or a muscular issue.  This appears to be mild.  She will monitor for the next week to week and a half and if still present I would consider imaging at that time.

## 2022-09-06 NOTE — Assessment & Plan Note (Signed)
Physical exam completed.  Encouraged continued healthy diet and exercise.  Cancer screening is up-to-date.  Encourage patient get the pneumonia vaccine.  She will be scheduled for a nurse visit for this.  Lab work as outlined.

## 2022-09-06 NOTE — Addendum Note (Signed)
Addended by: Glori Luis on: 09/06/2022 09:08 AM   Modules accepted: Orders

## 2022-09-13 ENCOUNTER — Telehealth: Payer: Self-pay

## 2022-09-13 NOTE — Telephone Encounter (Signed)
-----   Message from [name] sent at 09/13/2022  8:59 AM EDT ----- Please relay the MyChart result message to the patient.  Thanks.

## 2022-09-13 NOTE — Telephone Encounter (Signed)
Left message to call the office back regarding the mychart results message.

## 2022-09-20 ENCOUNTER — Ambulatory Visit: Payer: Medicare HMO

## 2022-09-21 ENCOUNTER — Ambulatory Visit: Payer: Medicare HMO

## 2022-10-02 ENCOUNTER — Ambulatory Visit: Payer: Medicare HMO

## 2022-10-02 DIAGNOSIS — R194 Change in bowel habit: Secondary | ICD-10-CM | POA: Diagnosis not present

## 2022-10-02 DIAGNOSIS — Z980 Intestinal bypass and anastomosis status: Secondary | ICD-10-CM | POA: Diagnosis not present

## 2022-10-02 DIAGNOSIS — K640 First degree hemorrhoids: Secondary | ICD-10-CM | POA: Diagnosis not present

## 2022-10-02 DIAGNOSIS — K219 Gastro-esophageal reflux disease without esophagitis: Secondary | ICD-10-CM | POA: Diagnosis not present

## 2022-10-02 DIAGNOSIS — K625 Hemorrhage of anus and rectum: Secondary | ICD-10-CM | POA: Diagnosis not present

## 2022-10-02 DIAGNOSIS — R197 Diarrhea, unspecified: Secondary | ICD-10-CM | POA: Diagnosis not present

## 2022-10-02 DIAGNOSIS — R112 Nausea with vomiting, unspecified: Secondary | ICD-10-CM | POA: Diagnosis not present

## 2022-10-02 DIAGNOSIS — K5730 Diverticulosis of large intestine without perforation or abscess without bleeding: Secondary | ICD-10-CM | POA: Diagnosis not present

## 2022-10-02 DIAGNOSIS — R110 Nausea: Secondary | ICD-10-CM | POA: Diagnosis not present

## 2022-10-10 ENCOUNTER — Telehealth: Payer: Self-pay

## 2022-10-10 NOTE — Telephone Encounter (Signed)
Patient states she has seen Dr. [name] for this issue in the past but she is still experiencing a gnawing pain in her right side, 2 inches from belly button and 1-2 inches up. Patient states she can feel it when she bends over, stretches, and when she presses on it. Patient states her pain is better than it was in August but still gnawing. Patient declined Chief Technology Officer.  I scheduled an appointment for patient to see Dr. Birdie Sons tomorrow at 8am.

## 2022-10-10 NOTE — Telephone Encounter (Signed)
Noted.  We will discuss further evaluation and management options tomorrow.

## 2022-10-11 ENCOUNTER — Encounter: Payer: Self-pay | Admitting: Family Medicine

## 2022-10-11 ENCOUNTER — Telehealth: Payer: Self-pay | Admitting: Family Medicine

## 2022-10-11 ENCOUNTER — Ambulatory Visit (INDEPENDENT_AMBULATORY_CARE_PROVIDER_SITE_OTHER): Payer: Medicare HMO | Admitting: Family Medicine

## 2022-10-11 VITALS — BP 134/82 | HR 111 | Temp 97.6°F | Ht 60.75 in | Wt 154.0 lb

## 2022-10-11 DIAGNOSIS — R109 Unspecified abdominal pain: Secondary | ICD-10-CM | POA: Diagnosis not present

## 2022-10-11 DIAGNOSIS — I10 Essential (primary) hypertension: Secondary | ICD-10-CM | POA: Diagnosis not present

## 2022-10-11 MED ORDER — AMLODIPINE BESYLATE 5 MG PO TABS
5.0000 mg | ORAL_TABLET | Freq: Every day | ORAL | 1 refills | Status: DC
Start: 2022-10-11 — End: 2023-04-11

## 2022-10-11 NOTE — Assessment & Plan Note (Signed)
Chronic issue.  Uncontrolled.  Adding back amlodipine 5 mg daily.  She will continue losartan 100 mg daily.  Follow-up in 3 months.

## 2022-10-11 NOTE — Telephone Encounter (Signed)
Lft pt vm to call ofc to sch CT. thanks

## 2022-10-11 NOTE — Assessment & Plan Note (Signed)
Right-sided abdominal pain.  Symptoms are consistent with a gallbladder issue.  Given persistence and tenderness on exam we will get a CT scan.  If she does not hear from scheduling on this in the next week she will let us know.

## 2022-10-11 NOTE — Progress Notes (Signed)
[name], MD Phone: [phone]  Christie Ramirez is a 67 y.o. female who presents today for same day visit.   Abdominal pain: Patient reports onset of right-sided abdominal discomfort in August.  She had 2 to 3 days with severe right-sided abdominal discomfort and notes it has improved some since then though has constant gnawing pain in her right abdomen.  She notes no nausea or vomiting.  She has occasional diarrhea.  She has blood from hemorrhoids.  She notes no hematuria or dysuria.  No vaginal bleeding or vaginal discharge.  Recent colonoscopy and endoscopy unremarkable for a cause of the symptoms.  Patient reports appendix was eaten up with endometriosis previously.  She notes she is status post hysterectomy and BSO.  She still has her gallbladder.  Notes symptoms are not worsened by eating.  Notes stress makes them worse.  Hypertension: Patient reports blood pressure has been a little elevated recently.  She has only been taking losartan 100 mg daily.  Social History   Tobacco Use  Smoking Status Never  Smokeless Tobacco Never    Current Outpatient Medications on File Prior to Visit  Medication Sig Dispense Refill   albuterol (VENTOLIN HFA) 108 (90 Base) MCG/ACT inhaler Inhale 2 puffs into the lungs every 6 (six) hours as needed for wheezing or shortness of breath. 1 each 1   aspirin 81 MG tablet Take 81 mg by mouth daily.     atorvastatin (LIPITOR) 40 MG tablet Take 1 tablet (40 mg total) by mouth daily. 90 tablet 1   cetirizine (ZYRTEC) 10 MG tablet SMARTSIG:1 Pill By Mouth Daily PRN 90 tablet 3   escitalopram (LEXAPRO) 10 MG tablet Take 1 tablet (10 mg total) by mouth daily. 90 tablet 1   levothyroxine (SYNTHROID) 137 MCG tablet TAKE ONE TABLET BY MOUTH DAILY BEFORE BREAKFAST 90 tablet 1   losartan (COZAAR) 100 MG tablet TAKE 1 TABLET BY MOUTH DAILY 90 tablet 1   Multiple Vitamins-Minerals (MULTIVITAMIN WITH MINERALS) tablet Take 1 tablet by mouth daily.     traZODone  (DESYREL) 50 MG tablet TAKE TWO TABLETS BY MOUTH AT BEDTIME AS NEEDED FOR SLEEP 180 tablet 1   triamcinolone (NASACORT) 55 MCG/ACT AERO nasal inhaler Place 2 sprays into the nose daily. 1 each 12   vitamin C (ASCORBIC ACID) 500 MG tablet Take 500 mg by mouth daily.     Current Facility-Administered Medications on File Prior to Visit  Medication Dose Route Frequency Provider Last Rate Last Admin   betamethasone acetate-betamethasone sodium phosphate (CELESTONE) injection 3 mg  3 mg Intra-articular Once Felecia Shelling, DPM         ROS see history of present illness  Objective  Physical Exam Vitals:   10/11/22 0808 10/11/22 0815  BP: 128/82 134/82  Pulse: (!) 111   Temp: 97.6 F (36.4 C)   SpO2: 94%     BP Readings from Last 3 Encounters:  10/11/22 134/82  09/06/22 122/70  07/25/22 134/88   Wt Readings from Last 3 Encounters:  10/11/22 154 lb (69.9 kg)  09/06/22 150 lb 6.4 oz (68.2 kg)  05/29/22 156 lb 8 oz (71 kg)    Physical Exam Constitutional:      General: She is not in acute distress.    Appearance: She is not diaphoretic.  Cardiovascular:     Rate and Rhythm: Normal rate and regular rhythm.     Heart sounds: Normal heart sounds.  Pulmonary:     Effort: Pulmonary effort is normal.  Breath sounds: Normal breath sounds.  Abdominal:     General: Bowel sounds are normal. There is no distension.     Palpations: Abdomen is soft. There is no mass.     Tenderness: There is no guarding.    Skin:    General: Skin is warm and dry.  Neurological:     Mental Status: She is alert.      Assessment/Plan: Please see individual problem list.  Abdominal pain, unspecified abdominal location Assessment & Plan: Right-sided abdominal pain.  Symptoms are consistent with a gallbladder issue.  Given persistence and tenderness on exam we will get a CT scan.  If she does not hear from scheduling on this in the next week she will let us know.  Orders: -     CT ABDOMEN  PELVIS WO CONTRAST; Future  Primary hypertension Assessment & Plan: Chronic issue.  Uncontrolled.  Adding back amlodipine 5 mg daily.  She will continue losartan 100 mg daily.  Follow-up in 3 months.  Orders: -     amLODIPine Besylate; Take 1 tablet (5 mg total) by mouth daily.  Dispense: 90 tablet; Refill: 1    Return in about 3 months (around 01/10/2023) for BP follow-up.   [name], MD [facility] Primary Care [facility]

## 2022-10-20 ENCOUNTER — Ambulatory Visit
Admission: RE | Admit: 2022-10-20 | Discharge: 2022-10-20 | Disposition: A | Discharge status: Home / Self Care | Payer: Medicare HMO | Source: Ambulatory Visit | Attending: Family Medicine | Admitting: Family Medicine

## 2022-10-20 DIAGNOSIS — K769 Liver disease, unspecified: Secondary | ICD-10-CM | POA: Diagnosis not present

## 2022-10-20 DIAGNOSIS — R109 Unspecified abdominal pain: Secondary | ICD-10-CM | POA: Insufficient documentation

## 2022-10-20 DIAGNOSIS — K5730 Diverticulosis of large intestine without perforation or abscess without bleeding: Secondary | ICD-10-CM | POA: Diagnosis not present

## 2022-10-22 NOTE — Progress Notes (Unsigned)
Sheran Newstrom T. Jelicia Nantz, MD, CAQ Sports Medicine [facility] at [facility] [address] [place], [address]  Phone: [phone]  FAX: [phone]  Margrett Audley - 67 y.o. female  MRN [id number]  Date of Birth: [birth year]  Date: 10/23/2022  PCP: Glori Luis, MD  Referral: Glori Luis, MD  No chief complaint on file.  Subjective:   Enriqueta Shaikh is a 67 y.o. very pleasant female patient with There is no height or weight on file to calculate BMI. who presents with the following:  Patient presents with some ongoing left-sided calcific tendinitis.  I saw her for this in May 2024.  It looks as if she actually saw Dr. Rennis Chris within a couple of weeks of seeing me, as well.  On chart review, the patient also had an MRI done in conjunction with Dr. Rennis Chris.  At the time of his last office visit in June 2024, he recommended arthroscopic rotator cuff Tear with debridement of the calcific tendon.    Review of Systems is noted in the HPI, as appropriate  Objective:   There were no vitals taken for this visit.  GEN: No acute distress; alert,appropriate. PULM: Breathing comfortably in no respiratory distress PSYCH: Normally interactive.   Laboratory and Imaging Data:  Assessment and Plan:   ***

## 2022-10-23 ENCOUNTER — Ambulatory Visit (INDEPENDENT_AMBULATORY_CARE_PROVIDER_SITE_OTHER): Payer: Medicare HMO | Admitting: Family Medicine

## 2022-10-23 ENCOUNTER — Encounter: Payer: Self-pay | Admitting: Family Medicine

## 2022-10-23 VITALS — BP 134/64 | HR 75 | Temp 97.6°F | Ht 60.75 in | Wt 154.1 lb

## 2022-10-23 DIAGNOSIS — M25512 Pain in left shoulder: Secondary | ICD-10-CM | POA: Diagnosis not present

## 2022-10-23 DIAGNOSIS — M7532 Calcific tendinitis of left shoulder: Secondary | ICD-10-CM

## 2022-10-23 DIAGNOSIS — G8929 Other chronic pain: Secondary | ICD-10-CM | POA: Diagnosis not present

## 2022-10-23 MED ORDER — TRIAMCINOLONE ACETONIDE 40 MG/ML IJ SUSP
40.0000 mg | Freq: Once | INTRAMUSCULAR | Status: DC
Start: 2022-10-23 — End: 2022-10-23

## 2022-10-23 MED ORDER — TRIAMCINOLONE ACETONIDE 40 MG/ML IJ SUSP
40.0000 mg | Freq: Once | INTRAMUSCULAR | Status: AC
Start: 2022-10-23 — End: 2022-10-23
  Administered 2022-10-23: 40 mg via INTRA_ARTICULAR

## 2022-11-06 ENCOUNTER — Telehealth: Payer: Self-pay | Admitting: Family Medicine

## 2022-11-06 NOTE — Telephone Encounter (Signed)
Called Patient to let her know that her CT scan results are not back yet but we will call her when the results come in.

## 2022-11-06 NOTE — Telephone Encounter (Signed)
Patient called and would someone to call her back regarding her CT Scan results.

## 2022-11-10 ENCOUNTER — Telehealth: Payer: Self-pay

## 2022-11-10 NOTE — Telephone Encounter (Signed)
Left message to call the office back regarding her CT scan below.

## 2022-11-10 NOTE — Telephone Encounter (Signed)
Patient consents to GI Referral. Patient also states in her CT scan there is water around heart and found hardening of artery slightly. Please call

## 2022-11-10 NOTE — Telephone Encounter (Signed)
-----   Message from [name] sent at 11/10/2022  2:39 PM EDT ----- Please let the patient know that her CT scan does not reveal a specific cause for her symptoms.  There is a lesion in her liver that is consistent with a hemangioma and this has been seen previously.  She has a small periumbilical hernia though I doubt that it is causing her symptoms.  At this point I think would be a good idea to have her see GI to evaluate further.  I can place that referral once you speak with her.

## 2022-11-13 NOTE — Telephone Encounter (Signed)
Left message to call the office back regarding her CT scan results.

## 2022-11-14 NOTE — Telephone Encounter (Signed)
Lvm for pt to give office a call back

## 2022-11-16 NOTE — Telephone Encounter (Signed)
Left message to call the office back regarding her CT scan results.

## 2022-11-16 NOTE — Telephone Encounter (Signed)
Patient just returned phone call. Can someone call her back about her CT Scan results. Her number is [phone].

## 2022-11-16 NOTE — Telephone Encounter (Signed)
Left message to call the office back regarding CT results.

## 2022-11-17 NOTE — Telephone Encounter (Signed)
Spoke with pt. Pt stated she is already seeing GI would like to know what her next steps are?   Pt also was reading report and had concerns. Went through the report with pt. And advise that these findings where confirmed from previous scans.   Pt wanted mychart msg of results sent to.

## 2022-11-17 NOTE — Telephone Encounter (Signed)
Patient just called and would like for someone to call her about her CT results. She said she has been trying to get in contact with [place]. Her number is [phone].

## 2022-11-21 DIAGNOSIS — H2513 Age-related nuclear cataract, bilateral: Secondary | ICD-10-CM | POA: Diagnosis not present

## 2022-11-21 DIAGNOSIS — H04123 Dry eye syndrome of bilateral lacrimal glands: Secondary | ICD-10-CM | POA: Diagnosis not present

## 2022-11-21 NOTE — Telephone Encounter (Signed)
Left message to call the office back regarding Dr. Sonnenberg's message 

## 2022-11-21 NOTE — Telephone Encounter (Signed)
Noted.  I can see that she saw GI back in July.  It looks like she had a colonoscopy and endoscopy that were unremarkable for a cause of her symptoms.  At this point I am not sure what is causing her abdominal pain.  Her scan did not show any causes.  She needs to see GI again to get their input on other potential causes.  Does she have follow-up scheduled with them?

## 2022-11-24 HISTORY — PX: SHOULDER ARTHROTOMY: SHX1050

## 2022-12-02 ENCOUNTER — Other Ambulatory Visit: Payer: Self-pay | Admitting: Family Medicine

## 2022-12-02 DIAGNOSIS — I10 Essential (primary) hypertension: Secondary | ICD-10-CM

## 2022-12-04 ENCOUNTER — Other Ambulatory Visit: Payer: Self-pay | Admitting: Family Medicine

## 2022-12-04 DIAGNOSIS — G4700 Insomnia, unspecified: Secondary | ICD-10-CM

## 2022-12-15 DIAGNOSIS — M24112 Other articular cartilage disorders, left shoulder: Secondary | ICD-10-CM | POA: Diagnosis not present

## 2022-12-15 DIAGNOSIS — Z4789 Encounter for other orthopedic aftercare: Secondary | ICD-10-CM | POA: Diagnosis not present

## 2022-12-15 DIAGNOSIS — M7532 Calcific tendinitis of left shoulder: Secondary | ICD-10-CM | POA: Diagnosis not present

## 2022-12-15 DIAGNOSIS — S43432A Superior glenoid labrum lesion of left shoulder, initial encounter: Secondary | ICD-10-CM | POA: Diagnosis not present

## 2022-12-15 DIAGNOSIS — M25512 Pain in left shoulder: Secondary | ICD-10-CM | POA: Diagnosis not present

## 2022-12-15 DIAGNOSIS — Z967 Presence of other bone and tendon implants: Secondary | ICD-10-CM | POA: Diagnosis not present

## 2022-12-15 DIAGNOSIS — G8918 Other acute postprocedural pain: Secondary | ICD-10-CM | POA: Diagnosis not present

## 2022-12-15 DIAGNOSIS — M7542 Impingement syndrome of left shoulder: Secondary | ICD-10-CM | POA: Diagnosis not present

## 2022-12-15 DIAGNOSIS — M7552 Bursitis of left shoulder: Secondary | ICD-10-CM | POA: Diagnosis not present

## 2022-12-25 DIAGNOSIS — M25512 Pain in left shoulder: Secondary | ICD-10-CM | POA: Diagnosis not present

## 2022-12-25 DIAGNOSIS — M25612 Stiffness of left shoulder, not elsewhere classified: Secondary | ICD-10-CM | POA: Diagnosis not present

## 2023-01-08 DIAGNOSIS — M25512 Pain in left shoulder: Secondary | ICD-10-CM | POA: Diagnosis not present

## 2023-01-08 DIAGNOSIS — M25612 Stiffness of left shoulder, not elsewhere classified: Secondary | ICD-10-CM | POA: Diagnosis not present

## 2023-01-10 ENCOUNTER — Ambulatory Visit: Payer: Medicare HMO | Admitting: Family Medicine

## 2023-02-19 DIAGNOSIS — M25512 Pain in left shoulder: Secondary | ICD-10-CM | POA: Diagnosis not present

## 2023-02-19 DIAGNOSIS — M25612 Stiffness of left shoulder, not elsewhere classified: Secondary | ICD-10-CM | POA: Diagnosis not present

## 2023-03-02 DIAGNOSIS — M25612 Stiffness of left shoulder, not elsewhere classified: Secondary | ICD-10-CM | POA: Diagnosis not present

## 2023-03-02 DIAGNOSIS — M25512 Pain in left shoulder: Secondary | ICD-10-CM | POA: Diagnosis not present

## 2023-03-05 DIAGNOSIS — M25512 Pain in left shoulder: Secondary | ICD-10-CM | POA: Diagnosis not present

## 2023-03-05 DIAGNOSIS — M25612 Stiffness of left shoulder, not elsewhere classified: Secondary | ICD-10-CM | POA: Diagnosis not present

## 2023-03-09 ENCOUNTER — Ambulatory Visit: Payer: Medicare HMO | Admitting: Family Medicine

## 2023-03-09 ENCOUNTER — Encounter: Payer: Medicare HMO | Admitting: Family Medicine

## 2023-04-10 NOTE — Progress Notes (Unsigned)
 Bethanie Dicker, NP-C Phone: [phone]  Christie Ramirez is a 68 y.o. female who presents today for transfer of care.   Discussed the use of AI scribe software for clinical note transcription with the patient, who gave verbal consent to proceed.  History of Present Illness   Christie Ramirez is a 68 year old female who presents for a transfer of care visit.  She has a history of cancer, having had thyroid and breast cancer, and endometriosis. She is concerned about the possibility of cancer recurrence, noting a pattern of occurrence every twenty years. She lives life fully except for this concern.  She experiences persistent pain on the right side of her abdomen. A CT scan showed a small hernia and small pericardial effusion but no cause for her symptoms. She underwent a follow-up with gastroenterology, but no issues were found.  She underwent surgery on her left shoulder on December 15, 2022, and is currently doing physical therapy. Her shoulder is doing well, although she experiences some pain when performing certain movements. She is scheduled for a follow-up appointment next Monday.  She is on Norvasc 5 mg and Losartan 100 mg daily for blood pressure management, which is well-controlled. No chest pain, shortness of breath, dizziness, or leg swelling.  She is on Levothyroxine for thyroid management and reports occasional heart palpitations when stressed. She experiences cold intolerance and hair thinning, which she attributes to aging.  She has a history of prediabetes with an A1c of 5.9, which has been stable for a couple of years. No excessive thirst or urination. She maintains a diet low in carbohydrates and acknowledges a lack of exercise due to her shoulder recovery, but plans to resume walking as the weather improves.  She is on Lexapro for anxiety and reports that her mood is generally good, though she experiences anxiety at night, particularly when her mind is racing. She takes  Trazodone for sleep, sometimes increasing the dose when needed. No significant daytime anxiety, but it is influenced by her workload and relationship with her boss.  She has a history of thyroid cancer and breast cancer, with no significant family history of breast or ovarian cancer known. Her mother had fibrous breast tissue but no cancer. She is considering genetic testing for cancer predisposition due to family concerns.      Social History   Tobacco Use  Smoking Status Never  Smokeless Tobacco Never    Current Outpatient Medications on File Prior to Visit  Medication Sig Dispense Refill   aspirin 81 MG tablet Take 81 mg by mouth daily.     Multiple Vitamins-Minerals (MULTIVITAMIN WITH MINERALS) tablet Take 1 tablet by mouth daily.     triamcinolone (NASACORT) 55 MCG/ACT AERO nasal inhaler Place 2 sprays into the nose daily. 1 each 12   vitamin C (ASCORBIC ACID) 500 MG tablet Take 500 mg by mouth daily.     Current Facility-Administered Medications on File Prior to Visit  Medication Dose Route Frequency Provider Last Rate Last Admin   betamethasone acetate-betamethasone sodium phosphate (CELESTONE) injection 3 mg  3 mg Intra-articular Once Felecia Shelling, DPM        ROS see history of present illness  Objective  Physical Exam Vitals:   04/11/23 0857  BP: 118/72  Pulse: 94  Temp: 97.8 F (36.6 C)  SpO2: 94%    BP Readings from Last 3 Encounters:  04/11/23 118/72  10/23/22 134/64  10/11/22 134/82   Wt Readings from Last 3 Encounters:  04/11/23 154  lb (69.9 kg)  10/23/22 154 lb 2 oz (69.9 kg)  10/11/22 154 lb (69.9 kg)    Physical Exam Constitutional:      General: She is not in acute distress.    Appearance: Normal appearance.  HENT:     Head: Normocephalic.  Cardiovascular:     Rate and Rhythm: Normal rate and regular rhythm.     Heart sounds: Normal heart sounds.  Pulmonary:     Effort: Pulmonary effort is normal.     Breath sounds: Normal breath sounds.   Skin:    General: Skin is warm and dry.  Neurological:     General: No focal deficit present.     Mental Status: She is alert.  Psychiatric:        Mood and Affect: Mood normal.        Behavior: Behavior normal.     Assessment/Plan: Please see individual problem list.  Anxiety and depression Assessment & Plan: She experiences anxiety primarily at night, exacerbated by work stress. Currently on Lexapro and Trazodone, the decision was made to increase Lexapro to 20 mg daily. Continue Trazodone for sleep. Monitor anxiety symptoms and adjust treatment as needed. Encouraged to contact if worsening symptoms, unusual behavior changes or suicidal thoughts occur.   Orders: -     Escitalopram Oxalate; Take 1 tablet (20 mg total) by mouth at bedtime.  Dispense: 90 tablet; Refill: 3  Abdominal pain, unspecified abdominal location Assessment & Plan: She experiences chronic right-sided abdominal pain with no clear etiology. Previous imaging revealed a small hernia. Dietary factors were discussed as potential contributors. Consider dietary modifications to assess her impact on symptoms.   Chronic left shoulder pain Assessment & Plan: Had surgery in November. Continue physical therapy as scheduled and follow up with Ortho next week.    Primary hypertension Assessment & Plan: Her blood pressure is well-controlled on Norvasc and Losartan. Continue Norvasc 5 mg daily and Losartan 100 mg daily. Monitor blood pressure regularly and check lab work as outlined.   Orders: -     amLODIPine Besylate; Take 1 tablet (5 mg total) by mouth daily.  Dispense: 90 tablet; Refill: 3 -     Losartan Potassium; Take 1 tablet (100 mg total) by mouth daily.  Dispense: 90 tablet; Refill: 3 -     Comprehensive metabolic panel  Postoperative hypothyroidism Assessment & Plan: She is on levothyroxine therapy and reports cold intolerance and hair thinning. Check thyroid function tests today and continue levothyroxine  137 mcg. We will adjust medication if necessary pending lab results.   Orders: -     Levothyroxine Sodium; TAKE ONE TABLET BY MOUTH DAILY BEFORE BREAKFAST  Dispense: 90 tablet; Refill: 3 -     TSH  Hyperlipidemia with target low density lipoprotein (LDL) cholesterol less than 100 mg/dL Assessment & Plan: Her cholesterol levels are managed with Lipitor 80 mg daily. She does experience intermittent muscle aches more so with long periods of walking. Continue Lipitor 80 mg. Monitor for worsening muscle aches and consider alternative therapy if symptoms worsen.  Orders: -     Atorvastatin Calcium; Take 1 tablet (80 mg total) by mouth daily.  Dispense: 90 tablet; Refill: 3  Prediabetes Assessment & Plan: Her A1c was 5.9, indicating prediabetes. This has been a chronic issue for her. Her diet is relatively healthy, but exercise is lacking due to recent shoulder surgery. Encourage increased physical activity as her shoulder improves and monitor A1c levels regularly.    Insomnia, unspecified type -  traZODone HCl; TAKE TWO TABLETS BY MOUTH EVERY NIGHT AT BEDTIME AS NEEDED FOR SLEEP  Dispense: 180 tablet; Refill: 3  Allergic rhinitis, unspecified seasonality, unspecified trigger -     Albuterol Sulfate HFA; Inhale 2 puffs into the lungs every 6 (six) hours as needed for wheezing or shortness of breath.  Dispense: 8 g; Refill: 5 -     Cetirizine HCl; SMARTSIG:1 Pill By Mouth Daily PRN  Dispense: 90 tablet; Refill: 3    Return in about 5 months (around 09/07/2023) for Annual Exam.   Bethanie Dicker, NP-C [facility]

## 2023-04-11 ENCOUNTER — Encounter: Payer: Self-pay | Admitting: Nurse Practitioner

## 2023-04-11 ENCOUNTER — Ambulatory Visit: Payer: Medicare HMO | Admitting: Nurse Practitioner

## 2023-04-11 VITALS — BP 118/72 | HR 94 | Temp 97.8°F | Ht 60.75 in | Wt 154.0 lb

## 2023-04-11 DIAGNOSIS — R109 Unspecified abdominal pain: Secondary | ICD-10-CM | POA: Diagnosis not present

## 2023-04-11 DIAGNOSIS — M25512 Pain in left shoulder: Secondary | ICD-10-CM | POA: Diagnosis not present

## 2023-04-11 DIAGNOSIS — G4700 Insomnia, unspecified: Secondary | ICD-10-CM

## 2023-04-11 DIAGNOSIS — F32A Depression, unspecified: Secondary | ICD-10-CM

## 2023-04-11 DIAGNOSIS — E890 Postprocedural hypothyroidism: Secondary | ICD-10-CM

## 2023-04-11 DIAGNOSIS — I10 Essential (primary) hypertension: Secondary | ICD-10-CM | POA: Diagnosis not present

## 2023-04-11 DIAGNOSIS — J309 Allergic rhinitis, unspecified: Secondary | ICD-10-CM

## 2023-04-11 DIAGNOSIS — E785 Hyperlipidemia, unspecified: Secondary | ICD-10-CM

## 2023-04-11 DIAGNOSIS — F419 Anxiety disorder, unspecified: Secondary | ICD-10-CM | POA: Diagnosis not present

## 2023-04-11 DIAGNOSIS — R7303 Prediabetes: Secondary | ICD-10-CM

## 2023-04-11 DIAGNOSIS — G8929 Other chronic pain: Secondary | ICD-10-CM

## 2023-04-11 LAB — COMPREHENSIVE METABOLIC PANEL
ALT: 53 U/L — ABNORMAL HIGH (ref 0–35)
AST: 28 U/L (ref 0–37)
Albumin: 4.5 g/dL (ref 3.5–5.2)
Alkaline Phosphatase: 108 U/L (ref 39–117)
BUN: 18 mg/dL (ref 6–23)
CO2: 27 meq/L (ref 19–32)
Calcium: 9.6 mg/dL (ref 8.4–10.5)
Chloride: 105 meq/L (ref 96–112)
Creatinine, Ser: 0.66 mg/dL (ref 0.40–1.20)
GFR: 91.3 mL/min (ref >60.00)
Glucose, Bld: 105 mg/dL — ABNORMAL HIGH (ref 70–99)
Potassium: 3.6 meq/L (ref 3.5–5.1)
Sodium: 139 meq/L (ref 135–145)
Total Bilirubin: 0.8 mg/dL (ref 0.2–1.2)
Total Protein: 7.2 g/dL (ref 6.0–8.3)

## 2023-04-11 LAB — TSH: TSH: 0.76 u[IU]/mL (ref 0.35–5.50)

## 2023-04-11 MED ORDER — ALBUTEROL SULFATE HFA 108 (90 BASE) MCG/ACT IN AERS: 2.0000 | INHALATION_SPRAY | Freq: Four times a day (QID) | RESPIRATORY_TRACT | 5 refills | Status: AC | PRN

## 2023-04-11 MED ORDER — LOSARTAN POTASSIUM 100 MG PO TABS: 100.0000 mg | ORAL_TABLET | Freq: Every day | ORAL | 3 refills | Status: AC

## 2023-04-11 MED ORDER — AMLODIPINE BESYLATE 5 MG PO TABS: 5.0000 mg | ORAL_TABLET | Freq: Every day | ORAL | 3 refills | Status: AC

## 2023-04-11 MED ORDER — ESCITALOPRAM OXALATE 20 MG PO TABS: 20.0000 mg | ORAL_TABLET | Freq: Every day | ORAL | 3 refills | Status: AC

## 2023-04-11 MED ORDER — ATORVASTATIN CALCIUM 80 MG PO TABS: 80.0000 mg | ORAL_TABLET | Freq: Every day | ORAL | 3 refills | Status: AC

## 2023-04-11 MED ORDER — TRAZODONE HCL 50 MG PO TABS: ORAL_TABLET | ORAL | 3 refills | Status: AC

## 2023-04-11 MED ORDER — CETIRIZINE HCL 10 MG PO TABS: ORAL_TABLET | ORAL | 3 refills | Status: AC

## 2023-04-11 MED ORDER — LEVOTHYROXINE SODIUM 137 MCG PO TABS: ORAL_TABLET | ORAL | 3 refills | Status: AC

## 2023-04-11 NOTE — Assessment & Plan Note (Signed)
 She is on levothyroxine therapy and reports cold intolerance and hair thinning. Check thyroid function tests today and continue levothyroxine 137 mcg. We will adjust medication if necessary pending lab results.

## 2023-04-11 NOTE — Assessment & Plan Note (Signed)
 Her A1c was 5.9, indicating prediabetes. This has been a chronic issue for her. Her diet is relatively healthy, but exercise is lacking due to recent shoulder surgery. Encourage increased physical activity as her shoulder improves and monitor A1c levels regularly.

## 2023-04-11 NOTE — Assessment & Plan Note (Signed)
 She experiences anxiety primarily at night, exacerbated by work stress. Currently on Lexapro and Trazodone, the decision was made to increase Lexapro to 20 mg daily. Continue Trazodone for sleep. Monitor anxiety symptoms and adjust treatment as needed. Encouraged to contact if worsening symptoms, unusual behavior changes or suicidal thoughts occur.

## 2023-04-11 NOTE — Assessment & Plan Note (Signed)
 Her blood pressure is well-controlled on Norvasc and Losartan. Continue Norvasc 5 mg daily and Losartan 100 mg daily. Monitor blood pressure regularly and check lab work as outlined.

## 2023-04-11 NOTE — Assessment & Plan Note (Signed)
 Had surgery in November. Continue physical therapy as scheduled and follow up with Ortho next week.

## 2023-04-11 NOTE — Assessment & Plan Note (Signed)
 She experiences chronic right-sided abdominal pain with no clear etiology. Previous imaging revealed a small hernia. Dietary factors were discussed as potential contributors. Consider dietary modifications to assess her impact on symptoms.

## 2023-04-11 NOTE — Patient Instructions (Signed)
 YOUR MAMMOGRAM IS DUE, PLEASE CALL AND GET THIS SCHEDULED! [facility] - call [phone]

## 2023-04-11 NOTE — Assessment & Plan Note (Signed)
 Her cholesterol levels are managed with Lipitor 80 mg daily. She does experience intermittent muscle aches more so with long periods of walking. Continue Lipitor 80 mg. Monitor for worsening muscle aches and consider alternative therapy if symptoms worsen.

## 2023-04-18 ENCOUNTER — Ambulatory Visit: Payer: Medicare HMO | Admitting: Dermatology

## 2023-04-30 ENCOUNTER — Ambulatory Visit (INDEPENDENT_AMBULATORY_CARE_PROVIDER_SITE_OTHER)

## 2023-04-30 VITALS — BP 124/82 | HR 91 | Temp 98.2°F | Ht 60.75 in | Wt 154.8 lb

## 2023-04-30 DIAGNOSIS — H6992 Unspecified Eustachian tube disorder, left ear: Secondary | ICD-10-CM

## 2023-04-30 DIAGNOSIS — J069 Acute upper respiratory infection, unspecified: Secondary | ICD-10-CM

## 2023-04-30 MED ORDER — FLUTICASONE PROPIONATE 50 MCG/ACT NA SUSP: 2.0000 | Freq: Every day | NASAL | 0 refills | Status: AC

## 2023-04-30 MED ORDER — ALBUTEROL SULFATE HFA 108 (90 BASE) MCG/ACT IN AERS: 2.0000 | INHALATION_SPRAY | Freq: Four times a day (QID) | RESPIRATORY_TRACT | 0 refills | Status: AC | PRN

## 2023-04-30 MED ORDER — BENZONATATE 200 MG PO CAPS: 200.0000 mg | ORAL_CAPSULE | Freq: Three times a day (TID) | ORAL | 0 refills | Status: AC | PRN

## 2023-04-30 NOTE — Assessment & Plan Note (Signed)
 Discussed nature of viral illness with the patient. Symptomatic treatment discussed. She can take Tylenol, Ibuprofen for myalgias every 6 hourly. Tylenol less than 2 gm/day.   If symptoms does not improve in next 7-10 days or acutely worsens recommend reevaluation for potential secondary bacterial infection.  - She can take benzonatate 1 capsule  3 times daily as needed for cough.  - Albuterol inhaler, 2 puffs into the lungs every 6 (six) hours as needed for wheezing or shortness of breath.

## 2023-04-30 NOTE — Assessment & Plan Note (Signed)
 Likely from ongoing viral illness, recommend starting Flonase nasal spray, 2 puffs in each nostril daily for 7 days then prn. She can continue taking Zyrtec daily.

## 2023-04-30 NOTE — Progress Notes (Signed)
 Acute Office Visit  Subjective:    Patient ID: Christie Ramirez, female    DOB: 13-Nov-1955, 68 y.o.   MRN: [id number]  Chief Complaint  Patient presents with   Acute Visit    Coughing, runny nose, little diarrhea, fever on 04/29/23, body aches & chills started on 04/29/23   HPI Patient presenting for evaluation of URI like symptoms.  Patient reports her symptoms started on 04/29/23 morning. Initial symptoms included sinus congestion, sore throat, post nasal drip, clear rhinorrhea, headache, low grade fever (tmax at home 101.1), sore throat, left ear pain, myalgia. This morning she developed cough, productive with white sputum. She reports she had 2 episodes of lose Bowel movement yesterday. Improving today. She has taken Dayquill, Nyquill, Ibuprofen without significant symptom improvement.  She has a h/o seasonal allergy and takes BID zyrtec. She does not have h/o asthma, smoking. Patient is due for Influenza, COVID-19 immunization.  Patient's husband was seen at [facility] for similar symptoms on 04/24/23. He tested negative for COVID-19 during UC visit. He is feeling better.   ROS As per HPI    Objective:    BP 124/82   Pulse 91   Temp 98.2 F (36.8 C)   Ht 5' 0.75" (1.543 m)   Wt 154 lb 12.8 oz (70.2 kg)   SpO2 95%   BMI 29.49 kg/m    Physical Exam Constitutional:      Appearance: Normal appearance.  HENT:     Right Ear: Tympanic membrane is not injected, perforated, erythematous or bulging.     Left Ear: Tympanic membrane is bulging. Tympanic membrane is not injected, perforated or erythematous.     Nose: Rhinorrhea (clear) present.     Right Sinus: Maxillary sinus tenderness and frontal sinus tenderness present.     Left Sinus: Maxillary sinus tenderness and frontal sinus tenderness present.  Eyes:     Conjunctiva/sclera: Conjunctivae normal.  Cardiovascular:     Rate and Rhythm: Normal rate.  Pulmonary:     Breath sounds: Wheezing (mild expiratory wheezes b/l) present. No  rales.  Abdominal:     General: Bowel sounds are normal. There is no distension.     Palpations: Abdomen is soft.  Musculoskeletal:     Cervical back: Neck supple.     Right lower leg: No edema.     Left lower leg: No edema.  Lymphadenopathy:     Cervical: No cervical adenopathy.  Skin:    General: Skin is warm.  Neurological:     Mental Status: She is alert and oriented to person, place, and time.     No results found for any visits on 04/30/23.     Assessment & Plan:  Pleasant 68 year old female presenting for evaluation of acute cough, myalgias, low grade fever, left ear pain for 2 days. D/D includes viral URI, viral rhino sinusitis, strep pharyngitis. Centor score for strep pharyngitis 0, patient's symptoms likely viral in nature.    Viral URI with cough Assessment & Plan: Discussed nature of viral illness with the patient. Symptomatic treatment discussed. She can take Tylenol, Ibuprofen for myalgias every 6 hourly. Tylenol less than 2 gm/day.   If symptoms does not improve in next 7-10 days or acutely worsens recommend reevaluation for potential secondary bacterial infection.  - She can take benzonatate 1 capsule  3 times daily as needed for cough.  - Albuterol inhaler, 2 puffs into the lungs every 6 (six) hours as needed for wheezing or shortness of breath.  Orders: -     Benzonatate; Take 1 capsule (200 mg total) by mouth 3 (three) times daily as needed for cough.  Dispense: 21 capsule; Refill: 0 -     Albuterol Sulfate HFA; Inhale 2 puffs into the lungs every 6 (six) hours as needed for wheezing or shortness of breath.  Dispense: 8 g; Refill: 0  Acute dysfunction of left eustachian tube Assessment & Plan: Likely from ongoing viral illness, recommend starting Flonase nasal spray, 2 puffs in each nostril daily for 7 days then prn. She can continue taking Zyrtec daily.   Orders: -     Fluticasone Propionate; Place 2 sprays into both nostrils daily.  Dispense: 16 g;  Refill: 0    Return if symptoms worsen or fail to improve.  Skip Estimable, MD

## 2023-04-30 NOTE — Patient Instructions (Signed)
 For cough take benzonatate 200 mg, 1 capsule, three times a day as needed for 7 days.  For wheezing, use Albuterol inhaler, 2 puffs every 6 hours as needed as long as you have wheezing, cough.  For ear pressure, nasal congestion use nasal Flonase: 2 puffs in each nostril daily for 7 days and as needed after that. You should continue to take Zyrtec.  If your symptoms does not improve within next week or acutely gets worse we recommend scheduling a follow up appointment.

## 2023-05-15 ENCOUNTER — Other Ambulatory Visit: Payer: Self-pay | Admitting: Obstetrics and Gynecology

## 2023-05-15 DIAGNOSIS — Z1231 Encounter for screening mammogram for malignant neoplasm of breast: Secondary | ICD-10-CM

## 2023-05-28 ENCOUNTER — Ambulatory Visit: Admitting: Dermatology

## 2023-05-28 ENCOUNTER — Ambulatory Visit (INDEPENDENT_AMBULATORY_CARE_PROVIDER_SITE_OTHER): Admitting: Internal Medicine

## 2023-05-28 ENCOUNTER — Encounter: Payer: Self-pay | Admitting: Dermatology

## 2023-05-28 ENCOUNTER — Ambulatory Visit: Payer: Self-pay

## 2023-05-28 ENCOUNTER — Ambulatory Visit
Admission: RE | Admit: 2023-05-28 | Discharge: 2023-05-28 | Disposition: A | Discharge status: Home / Self Care | Source: Ambulatory Visit | Attending: Obstetrics and Gynecology | Admitting: Obstetrics and Gynecology

## 2023-05-28 VITALS — BP 122/76 | HR 80 | Temp 97.4°F | Ht 60.75 in | Wt 152.3 lb

## 2023-05-28 DIAGNOSIS — L578 Other skin changes due to chronic exposure to nonionizing radiation: Secondary | ICD-10-CM

## 2023-05-28 DIAGNOSIS — Z9889 Other specified postprocedural states: Secondary | ICD-10-CM

## 2023-05-28 DIAGNOSIS — L821 Other seborrheic keratosis: Secondary | ICD-10-CM

## 2023-05-28 DIAGNOSIS — L905 Scar conditions and fibrosis of skin: Secondary | ICD-10-CM

## 2023-05-28 DIAGNOSIS — M545 Low back pain, unspecified: Secondary | ICD-10-CM | POA: Diagnosis not present

## 2023-05-28 DIAGNOSIS — Z1231 Encounter for screening mammogram for malignant neoplasm of breast: Secondary | ICD-10-CM | POA: Insufficient documentation

## 2023-05-28 DIAGNOSIS — D229 Melanocytic nevi, unspecified: Secondary | ICD-10-CM

## 2023-05-28 DIAGNOSIS — Z872 Personal history of diseases of the skin and subcutaneous tissue: Secondary | ICD-10-CM

## 2023-05-28 DIAGNOSIS — L814 Other melanin hyperpigmentation: Secondary | ICD-10-CM

## 2023-05-28 DIAGNOSIS — D1801 Hemangioma of skin and subcutaneous tissue: Secondary | ICD-10-CM

## 2023-05-28 DIAGNOSIS — Z853 Personal history of malignant neoplasm of breast: Secondary | ICD-10-CM

## 2023-05-28 DIAGNOSIS — I10 Essential (primary) hypertension: Secondary | ICD-10-CM

## 2023-05-28 DIAGNOSIS — W908XXA Exposure to other nonionizing radiation, initial encounter: Secondary | ICD-10-CM | POA: Diagnosis not present

## 2023-05-28 DIAGNOSIS — Z1283 Encounter for screening for malignant neoplasm of skin: Secondary | ICD-10-CM | POA: Diagnosis not present

## 2023-05-28 MED ORDER — METHOCARBAMOL 750 MG PO TABS: 750.0000 mg | ORAL_TABLET | Freq: Three times a day (TID) | ORAL | 0 refills | Status: AC | PRN

## 2023-05-28 NOTE — Progress Notes (Signed)
 Acute Office Visit  Subjective:     Patient ID: Christie Ramirez, female    DOB: 1955-06-23, 68 y.o.   MRN: [id number]  Chief Complaint  Patient presents with   Acute Visit    Right hip pain 8-9/10 Fall in 2018     HPI Patient is in today for acute onset right-sided lower back pain.  Patient states that in 2017/18 she fell down on her right hip and had some acute pain at that time but that has since resolved.  She states that she has not had any intermittent pain since then.  However, 2 days ago she woke up with acute onset pain which she initially described as hip pain but pointed to her right lower back as well as her right upper gluteal area.  Patient states that the pain was severe and interferes with her ambulation over the weekend.  She took ibuprofen  without significant relief.  Patient states that the pain has improved today but she still has some mild pain present.  She is able to ambulate better today than she was over the weekend.  She denies any fevers or chills.  No rash.  Review of Systems  Constitutional: Negative.   HENT: Negative.    Respiratory: Negative.    Gastrointestinal: Negative.   Genitourinary: Negative.   Musculoskeletal:  Positive for back pain.  Skin: Negative.   Neurological: Negative.   Psychiatric/Behavioral: Negative.          Objective:    BP 122/76   Pulse 80   Temp (!) 97.4 F (36.3 C)   Ht 5' 0.75" (1.543 m)   Wt 152 lb 4.8 oz (69.1 kg)   SpO2 93%   BMI 29.01 kg/m    Physical Exam Constitutional:      Appearance: Normal appearance.  HENT:     Head: Normocephalic and atraumatic.  Cardiovascular:     Rate and Rhythm: Normal rate and regular rhythm.     Heart sounds: Normal heart sounds.  Pulmonary:     Effort: No respiratory distress.     Breath sounds: Normal breath sounds. No wheezing or rales.  Musculoskeletal:     Comments: Mild to moderate tenderness to palpation over her right lower back as well as her right upper  gluteal region.  No rash or ecchymosis or erythema noted  Skin:    Findings: No erythema or rash.  Neurological:     Mental Status: She is alert and oriented to person, place, and time.  Psychiatric:        Mood and Affect: Mood normal.        Behavior: Behavior normal.     No results found for any visits on 05/28/23.      Assessment & Plan:   Problem List Items Addressed This Visit       Cardiovascular and Mediastinum   Hypertension - Primary (Chronic)   - This problem is chronic and stable -Patient blood pressure is well-controlled on Norvasc  and losartan  -BMP was done earlier this year which was normal except for mildly elevated blood glucose of 105 -Blood pressure today is 122/76 -Will continue current regimen for now        Other   Low back pain   - Patient presents today with an acute right-sided lower back pain and upper gluteal pain x 2 days. -Patient states that she woke up with this pain and it interferes with her ambulation.  She had only minimal relief with ibuprofen  -Patient states  the pain has improved today but still present -On exam, patient had mild to moderate tenderness to palpation over right lower back and gluteal region.  No rash or erythema noted -I suspect patient likely has a musculoskeletal pain from a pulled muscle -Will treat the patient with Robaxin as needed, warm compresses, ibuprofen  as needed as well as back stretches to see if this will help with her pain -Return precautions given to the patient -No further workup at this time      Relevant Medications   methocarbamol (ROBAXIN-750) 750 MG tablet    Meds ordered this encounter  Medications   methocarbamol (ROBAXIN-750) 750 MG tablet    Sig: Take 1 tablet (750 mg total) by mouth every 8 (eight) hours as needed for muscle spasms.    Dispense:  15 tablet    Refill:  0    No follow-ups on file.  Berniece Abid, MD

## 2023-05-28 NOTE — Assessment & Plan Note (Signed)
-   Patient presents today with an acute right-sided lower back pain and upper gluteal pain x 2 days. -Patient states that she woke up with this pain and it interferes with her ambulation.  She had only minimal relief with ibuprofen  -Patient states the pain has improved today but still present -On exam, patient had mild to moderate tenderness to palpation over right lower back and gluteal region.  No rash or erythema noted -I suspect patient likely has a musculoskeletal pain from a pulled muscle -Will treat the patient with Robaxin as needed, warm compresses, ibuprofen  as needed as well as back stretches to see if this will help with her pain -Return precautions given to the patient -No further workup at this time

## 2023-05-28 NOTE — Telephone Encounter (Signed)
 Copied from CRM [id number]. Topic: Appointments - Appointment Scheduling >> May 28, 2023  8:52 AM Emylou G wrote: Back right hip hurting.. she didn't fall.. new severe pain.. hard to walk   Chief Complaint: Right Hip Symptoms: dull achy pain Frequency: constant Pertinent Negatives: Patient denies falls Disposition: [] ED /[] Urgent Care (no appt availability in office) / [x] Appointment(In office/virtual)/ []  [facility]/ [] Home Care/ [] Refused Recommended Disposition /[] [facility]/ []  Follow-up with PCP Additional Notes: Appt today at 11am  Reason for Disposition  [1] SEVERE pain (e.g., excruciating, unable to do any normal activities) AND [2] not improved after 2 hours of pain medicine  Answer Assessment - Initial Assessment Questions 1. LOCATION and RADIATION: "Where is the pain located?"      Right hip, toward the back  2. QUALITY: "What does the pain feel like?"  (e.g., sharp, dull, aching, burning)     Dull pain  3. SEVERITY: "How bad is the pain?" "What does it keep you from doing?"   (Scale 1-10; or mild, moderate, severe)   -  MILD (1-3): doesn't interfere with normal activities    -  MODERATE (4-7): interferes with normal activities (e.g., work or school) or awakens from sleep, limping    -  SEVERE (8-10): excruciating pain, unable to do any normal activities, unable to walk     Severe pain  4. ONSET: "When did the pain start?" "Does it come and go, or is it there all the time?"     Started 2 days ago  5. WORK OR EXERCISE: "Has there been any recent work or exercise that involved this part of the body?"      No  6. CAUSE: "What do you think is causing the hip pain?"      Unsure of cause  7. AGGRAVATING FACTORS: "What makes the hip pain worse?" (e.g., walking, climbing stairs, running)     No, hard to walk  8. OTHER SYMPTOMS: "Do you have any other symptoms?" (e.g., back pain, pain shooting down leg,  fever, rash)     Shoots down leg  Protocols  used: Hip Pain-A-AH

## 2023-05-28 NOTE — Patient Instructions (Signed)

## 2023-05-28 NOTE — Progress Notes (Signed)
   Follow-Up Visit   Subjective  Christie Ramirez is a 68 y.o. female who presents for the following: Skin Cancer Screening and Full Body Skin Exam  Left back and right ventral forearm. Areas of concern.   The patient presents for Total-Body Skin Exam (TBSE) for skin cancer screening and mole check. The patient has spots, moles and lesions to be evaluated, some may be new or changing and the patient may have concern these could be cancer.  The following portions of the chart were reviewed this encounter and updated as appropriate: medications, allergies, medical history  Review of Systems:  No other skin or systemic complaints except as noted in HPI or Assessment and Plan.  Objective  Well appearing patient in no apparent distress; mood and affect are within normal limits.  A full examination was performed including scalp, head, eyes, ears, nose, lips, neck, chest, axillae, abdomen, back, buttocks, bilateral upper extremities, bilateral lower extremities, hands, feet, fingers, toes, fingernails, and toenails. All findings within normal limits unless otherwise noted below.   Relevant physical exam findings are noted in the Assessment and Plan. right proximal tricep Pink macule chest Linear scar  Assessment & Plan   SKIN CANCER SCREENING PERFORMED TODAY.  ACTINIC DAMAGE - Chronic condition, secondary to cumulative UV/sun exposure - diffuse scaly erythematous macules with underlying dyspigmentation - Recommend daily broad spectrum sunscreen SPF 30+ to sun-exposed areas, reapply every 2 hours as needed.  - Staying in the shade or wearing long sleeves, sun glasses (UVA+UVB protection) and wide brim hats (4-inch brim around the entire circumference of the hat) are also recommended for sun protection.  - Call for new or changing lesions.  LENTIGINES, SEBORRHEIC KERATOSES, HEMANGIOMAS - Benign normal skin lesions - Benign-appearing - Call for any changes  MELANOCYTIC NEVI - Tan-brown  and/or pink-flesh-colored symmetric macules and papules - Benign appearing on exam today - Observation - Call clinic for new or changing moles - Recommend daily use of broad spectrum spf 30+ sunscreen to sun-exposed areas.   Hx of breast cancer Right Breast  Observe No lymphadenopathy.   SEBORRHEIC KERATOSIS - Stuck-on, waxy, tan-brown papules at left back - Benign-appearing - Discussed benign etiology and prognosis. - Observe - Call for any changes SKIN CANCER SCREENING   ACTINIC SKIN DAMAGE   LENTIGO   MELANOCYTIC NEVUS, UNSPECIFIED LOCATION   SEBORRHEIC KERATOSIS   HEMANGIOMA OF SKIN   HX OF BREAST CANCER   SCAR right proximal tricep Hx of lichen planus-like keratosis. Bx 2018.  Requested copy of pathology report from [facility] to scan into patient's chart. The patient will observe these symptoms, and report promptly any worsening or unexpected persistence.  If well, may return prn.  HISTORY OF EPIDERMAL INCLUSION CYST EXCISION chest Per patient, removed at [facility]. Unable to locate pathology report or procedure note.   Benign, observe.   Return in about 1 year (around 05/27/2024) for TBSE.  I, Darcie Easterly, CMA, am acting as scribe for Celine Collard, MD.   Documentation: I have reviewed the above documentation for accuracy and completeness, and I agree with the above.  Celine Collard, MD

## 2023-05-28 NOTE — Patient Instructions (Signed)
-   It was a pleasure meeting you today -I suspect that the pain that you have is likely secondary to a pulled muscle -I have prescribed a muscle relaxant (Robaxin) for you.  Please take it every 8 hours as needed.  This may cause some increased sedation so would not combine this with the trazodone  -I would continue with ibuprofen  every 8 hours as needed -Would apply warm compresses over the area -I have also given you some back exercises to do which may help with the pain -You can also try over-the-counter topical pain medication such as Bengay or IcyHot or Biofreeze to see if this will help with the pain -If your pain worsens or does not improve despite the above measures please follow-up with us  for further evaluation

## 2023-05-28 NOTE — Assessment & Plan Note (Signed)
-   This problem is chronic and stable -Patient blood pressure is well-controlled on Norvasc  and losartan  -BMP was done earlier this year which was normal except for mildly elevated blood glucose of 105 -Blood pressure today is 122/76 -Will continue current regimen for now

## 2023-08-26 DIAGNOSIS — M7989 Other specified soft tissue disorders: Secondary | ICD-10-CM | POA: Diagnosis not present

## 2023-08-27 ENCOUNTER — Other Ambulatory Visit: Payer: Self-pay | Admitting: Family

## 2023-08-27 DIAGNOSIS — M7989 Other specified soft tissue disorders: Secondary | ICD-10-CM

## 2023-08-29 ENCOUNTER — Ambulatory Visit: Admitting: Nurse Practitioner

## 2023-08-29 VITALS — BP 128/88 | HR 102 | Temp 98.0°F | Ht 60.75 in

## 2023-08-29 DIAGNOSIS — W57XXXA Bitten or stung by nonvenomous insect and other nonvenomous arthropods, initial encounter: Secondary | ICD-10-CM

## 2023-08-29 DIAGNOSIS — S40262A Insect bite (nonvenomous) of left shoulder, initial encounter: Secondary | ICD-10-CM | POA: Diagnosis not present

## 2023-08-29 MED ORDER — TRIAMCINOLONE ACETONIDE 0.1 % EX CREA: 1.0000 | TOPICAL_CREAM | Freq: Two times a day (BID) | CUTANEOUS | 1 refills | Status: AC

## 2023-08-29 NOTE — Progress Notes (Signed)
 Christie Glance, NP-C Phone: [phone]  Christie Ramirez is a 68 y.o. female who presents today for urgent care follow up.   Discussed the use of AI scribe software for clinical note transcription with the patient, who gave verbal consent to proceed.  History of Present Illness   Christie Ramirez is a 68 year old female who presents with a rash characterized by red spots on her back and buttocks.  The rash began yesterday morning with red spots at the bottom of her buttocks, initially thought to be spider bites. The spots are itchy and appear in a line. Additional spots were noted on her back, extending up her spine, and more spots appeared this morning. The rash is primarily on her back and buttocks, with a few spots on her arms. No burning or tingling sensations were experienced prior to the rash's appearance.  She had a virtual consultation where it was suggested that the rash might resemble shingles, despite having received a shingles vaccination. She has been exposed to new environments recently, including staying overnight in hospice with a terminally ill friend. She wore long pajamas to bed and has been cleaning her chair at work, raising concerns about potential spider or bug bites.  She took Benadryl  last night, which helped her sleep, and purchased hydrocortisone spray. She has not taken any medication this morning to allow for evaluation of the rash.  No fever, chills, or feeling unwell. She feels achy, which she attributes to age, and mentions a transient pain in her side, which was previously scanned last year with no significant findings.      Social History   Tobacco Use  Smoking Status Never  Smokeless Tobacco Never    Current Outpatient Medications on File Prior to Visit  Medication Sig Dispense Refill   albuterol  (VENTOLIN  HFA) 108 (90 Base) MCG/ACT inhaler Inhale 2 puffs into the lungs every 6 (six) hours as needed for wheezing or shortness of breath. 8 g 5    albuterol  (VENTOLIN  HFA) 108 (90 Base) MCG/ACT inhaler Inhale 2 puffs into the lungs every 6 (six) hours as needed for wheezing or shortness of breath. 8 g 0   amLODipine  (NORVASC ) 5 MG tablet Take 1 tablet (5 mg total) by mouth daily. 90 tablet 3   aspirin 81 MG tablet Take 81 mg by mouth daily.     atorvastatin  (LIPITOR) 80 MG tablet Take 1 tablet (80 mg total) by mouth daily. 90 tablet 3   benzonatate  (TESSALON ) 200 MG capsule Take 1 capsule (200 mg total) by mouth 3 (three) times daily as needed for cough. 21 capsule 0   cetirizine  (ZYRTEC ) 10 MG tablet SMARTSIG:1 Pill By Mouth Daily PRN 90 tablet 3   escitalopram  (LEXAPRO ) 20 MG tablet Take 1 tablet (20 mg total) by mouth at bedtime. 90 tablet 3   fluticasone  (FLONASE ) 50 MCG/ACT nasal spray Place 2 sprays into both nostrils daily. 16 g 0   levothyroxine  (SYNTHROID ) 137 MCG tablet TAKE ONE TABLET BY MOUTH DAILY BEFORE BREAKFAST 90 tablet 3   losartan  (COZAAR ) 100 MG tablet Take 1 tablet (100 mg total) by mouth daily. 90 tablet 3   methocarbamol  (ROBAXIN -750) 750 MG tablet Take 1 tablet (750 mg total) by mouth every 8 (eight) hours as needed for muscle spasms. 15 tablet 0   Multiple Vitamins-Minerals (MULTIVITAMIN WITH MINERALS) tablet Take 1 tablet by mouth daily.     traZODone  (DESYREL ) 50 MG tablet TAKE TWO TABLETS BY MOUTH EVERY NIGHT AT BEDTIME AS NEEDED FOR  SLEEP 180 tablet 3   triamcinolone  (NASACORT ) 55 MCG/ACT AERO nasal inhaler Place 2 sprays into the nose daily. 1 each 12   vitamin C (ASCORBIC ACID) 500 MG tablet Take 500 mg by mouth daily.     Current Facility-Administered Medications on File Prior to Visit  Medication Dose Route Frequency Provider Last Rate Last Admin   betamethasone  acetate-betamethasone  sodium phosphate  (CELESTONE ) injection 3 mg  3 mg Intra-articular Once Janit Thresa HERO, DPM         ROS see history of present illness  Objective  Physical Exam Vitals:   08/29/23 1129  BP: 128/88  Pulse: (!) 102   Temp: 98 F (36.7 C)  SpO2: 96%    BP Readings from Last 3 Encounters:  08/29/23 128/88  05/28/23 122/76  04/30/23 124/82   Wt Readings from Last 3 Encounters:  05/28/23 152 lb 4.8 oz (69.1 kg)  04/30/23 154 lb 12.8 oz (70.2 kg)  04/11/23 154 lb (69.9 kg)    Physical Exam Constitutional:      General: She is not in acute distress.    Appearance: Normal appearance.  HENT:     Head: Normocephalic.  Cardiovascular:     Rate and Rhythm: Normal rate and regular rhythm.     Heart sounds: Normal heart sounds.  Pulmonary:     Effort: Pulmonary effort is normal.     Breath sounds: Normal breath sounds.  Skin:    General: Skin is warm and dry.     Findings: Lesion present.     Comments: See pics below. Areas consistent with bug bites on left shoulder, bilateral buttock, left flank  Neurological:     General: No focal deficit present.     Mental Status: She is alert.  Psychiatric:        Mood and Affect: Mood normal.        Behavior: Behavior normal.         Assessment/Plan: Please see individual problem list.  Bug bite, initial encounter Assessment & Plan: The rash is likely due to insect bites, possibly bed bugs, with no systemic symptoms. She prefers topical treatment. Prescribed topical steroid cream. Advised Zyrtec  in the morning for antihistamine effect and Benadryl  at bedtime for sleep and itching. Instructed to report any changes, spreading, or increased pain.   Orders: -     Triamcinolone  Acetonide; Apply 1 Application topically 2 (two) times daily.  Dispense: 30 g; Refill: 1     Return if symptoms worsen or fail to improve.   Christie Glance, NP-C [facility]

## 2023-08-31 ENCOUNTER — Ambulatory Visit
Admission: RE | Admit: 2023-08-31 | Discharge: 2023-08-31 | Disposition: A | Discharge status: Home / Self Care | Source: Ambulatory Visit | Attending: Family | Admitting: Family

## 2023-08-31 DIAGNOSIS — M79622 Pain in left upper arm: Secondary | ICD-10-CM | POA: Diagnosis not present

## 2023-08-31 DIAGNOSIS — M7989 Other specified soft tissue disorders: Secondary | ICD-10-CM | POA: Diagnosis not present

## 2023-09-04 DIAGNOSIS — I10 Essential (primary) hypertension: Secondary | ICD-10-CM | POA: Diagnosis not present

## 2023-09-04 DIAGNOSIS — Z853 Personal history of malignant neoplasm of breast: Secondary | ICD-10-CM | POA: Diagnosis not present

## 2023-09-04 DIAGNOSIS — G4733 Obstructive sleep apnea (adult) (pediatric): Secondary | ICD-10-CM | POA: Diagnosis not present

## 2023-09-04 DIAGNOSIS — Z85850 Personal history of malignant neoplasm of thyroid: Secondary | ICD-10-CM | POA: Diagnosis not present

## 2023-09-04 DIAGNOSIS — R739 Hyperglycemia, unspecified: Secondary | ICD-10-CM | POA: Diagnosis not present

## 2023-09-04 DIAGNOSIS — Z0000 Encounter for general adult medical examination without abnormal findings: Secondary | ICD-10-CM | POA: Diagnosis not present

## 2023-09-04 DIAGNOSIS — Z1331 Encounter for screening for depression: Secondary | ICD-10-CM | POA: Diagnosis not present

## 2023-09-04 DIAGNOSIS — Z780 Asymptomatic menopausal state: Secondary | ICD-10-CM | POA: Diagnosis not present

## 2023-09-05 DIAGNOSIS — M7989 Other specified soft tissue disorders: Secondary | ICD-10-CM | POA: Diagnosis not present

## 2023-09-05 DIAGNOSIS — M19071 Primary osteoarthritis, right ankle and foot: Secondary | ICD-10-CM | POA: Diagnosis not present

## 2023-09-05 DIAGNOSIS — M79671 Pain in right foot: Secondary | ICD-10-CM | POA: Diagnosis not present

## 2023-09-12 ENCOUNTER — Encounter: Admitting: Nurse Practitioner

## 2023-09-13 ENCOUNTER — Encounter: Payer: Self-pay | Admitting: Nurse Practitioner

## 2023-09-13 NOTE — Assessment & Plan Note (Signed)
 The rash is likely due to insect bites, possibly bed bugs, with no systemic symptoms. She prefers topical treatment. Prescribed topical steroid cream. Advised Zyrtec  in the morning for antihistamine effect and Benadryl  at bedtime for sleep and itching. Instructed to report any changes, spreading, or increased pain.

## 2023-09-19 DIAGNOSIS — M8588 Other specified disorders of bone density and structure, other site: Secondary | ICD-10-CM | POA: Diagnosis not present

## 2023-10-03 DIAGNOSIS — I10 Essential (primary) hypertension: Secondary | ICD-10-CM | POA: Diagnosis not present

## 2023-10-03 DIAGNOSIS — Z853 Personal history of malignant neoplasm of breast: Secondary | ICD-10-CM | POA: Diagnosis not present

## 2023-10-03 DIAGNOSIS — N6489 Other specified disorders of breast: Secondary | ICD-10-CM | POA: Diagnosis not present

## 2023-10-03 DIAGNOSIS — E039 Hypothyroidism, unspecified: Secondary | ICD-10-CM | POA: Diagnosis not present

## 2023-10-03 DIAGNOSIS — N6322 Unspecified lump in the left breast, upper inner quadrant: Secondary | ICD-10-CM | POA: Diagnosis not present

## 2023-10-05 ENCOUNTER — Encounter: Admitting: Nurse Practitioner

## 2023-10-10 ENCOUNTER — Other Ambulatory Visit: Payer: Self-pay | Admitting: Internal Medicine

## 2023-10-10 DIAGNOSIS — N63 Unspecified lump in unspecified breast: Secondary | ICD-10-CM

## 2023-10-10 DIAGNOSIS — N6489 Other specified disorders of breast: Secondary | ICD-10-CM

## 2023-10-22 ENCOUNTER — Ambulatory Visit
Admission: RE | Admit: 2023-10-22 | Discharge: 2023-10-22 | Disposition: A | Discharge status: Home / Self Care | Source: Ambulatory Visit | Attending: Internal Medicine | Admitting: Internal Medicine

## 2023-10-22 DIAGNOSIS — N63 Unspecified lump in unspecified breast: Secondary | ICD-10-CM | POA: Insufficient documentation

## 2023-10-22 DIAGNOSIS — N6489 Other specified disorders of breast: Secondary | ICD-10-CM | POA: Diagnosis not present

## 2023-10-22 DIAGNOSIS — R92333 Mammographic heterogeneous density, bilateral breasts: Secondary | ICD-10-CM | POA: Diagnosis not present

## 2023-10-22 DIAGNOSIS — R928 Other abnormal and inconclusive findings on diagnostic imaging of breast: Secondary | ICD-10-CM | POA: Diagnosis not present

## 2023-12-03 DIAGNOSIS — M19071 Primary osteoarthritis, right ankle and foot: Secondary | ICD-10-CM | POA: Diagnosis not present

## 2023-12-03 DIAGNOSIS — M24574 Contracture, right foot: Secondary | ICD-10-CM | POA: Diagnosis not present

## 2023-12-03 DIAGNOSIS — M2021 Hallux rigidus, right foot: Secondary | ICD-10-CM | POA: Diagnosis not present

## 2023-12-03 DIAGNOSIS — M2041 Other hammer toe(s) (acquired), right foot: Secondary | ICD-10-CM | POA: Diagnosis not present

## 2024-01-21 ENCOUNTER — Ambulatory Visit

## 2024-01-21 DIAGNOSIS — W908XXA Exposure to other nonionizing radiation, initial encounter: Secondary | ICD-10-CM

## 2024-01-21 DIAGNOSIS — L821 Other seborrheic keratosis: Secondary | ICD-10-CM

## 2024-01-21 DIAGNOSIS — L578 Other skin changes due to chronic exposure to nonionizing radiation: Secondary | ICD-10-CM

## 2024-01-21 DIAGNOSIS — D1801 Hemangioma of skin and subcutaneous tissue: Secondary | ICD-10-CM | POA: Diagnosis not present

## 2024-01-21 DIAGNOSIS — Z1283 Encounter for screening for malignant neoplasm of skin: Secondary | ICD-10-CM

## 2024-01-21 DIAGNOSIS — L820 Inflamed seborrheic keratosis: Secondary | ICD-10-CM | POA: Diagnosis not present

## 2024-01-21 DIAGNOSIS — L814 Other melanin hyperpigmentation: Secondary | ICD-10-CM

## 2024-01-21 NOTE — Progress Notes (Signed)
" °  °  Subjective   Christie Ramirez is a 68 y.o. female who presents for the following: Lesion(s) of concern . Patient is new patient  Today patient reports: Here today concerning a growth at glabella that has been there 4 weeks and is growing  History of thyroid  and breast cancer. Denies personal or family history of skin cancer.   Review of Systems:    No other skin or systemic complaints except as noted in HPI or Assessment and Plan.  The following portions of the chart were reviewed this encounter and updated as appropriate: medications, allergies, medical history  Relevant Medical History:  n/a   Objective  (SKPE) Well appearing patient in no apparent distress; mood and affect are within normal limits. Examination was performed of uyz:qjrz  Examination notable for: SKIN EXAM, Angioma(s): Scattered red vascular papule(s)  , Lentigo/lentigines: Scattered pigmented macules that are tan to brown in color and are somewhat non-uniform in shape and concentrated in the sun-exposed areas, Seborrheic Keratosis(es): Stuck-on appearing keratotic papule(s) on the trunk, some  irritated with redness, crusting, edema, and/or partial avulsion, Actinic Damage/Elastosis: chronic sun damage: dyspigmentation, telangiectasia, and wrinkling  Examination limited by: Clothing   glabella x 1 Erythematous stuck-on, waxy papule or plaque  Assessment & Plan  (SKAP)   SKIN CANCER SCREENING PERFORMED TODAY.  BENIGN SKIN FINDINGS  - Lentigines  - Seborrheic keratoses  - Hemangiomas  - Reassurance provided regarding the benign appearance of lesions noted on exam today; no treatment is indicated in the absence of symptoms/changes. - Reinforced importance of photoprotective strategies including liberal and frequent sunscreen use of a broad-spectrum SPF 30 or greater, use of protective clothing, and sun avoidance for prevention of cutaneous malignancy and photoaging.  Counseled patient on the importance of  regular self-skin monitoring as well as routine clinical skin examinations as scheduled.   ACTINIC DAMAGE - Chronic condition, secondary to cumulative UV/sun exposure - Recommend daily broad spectrum sunscreen SPF 30+ to sun-exposed areas, reapply every 2 hours as needed.  - Staying in the shade or wearing long sleeves, sun glasses (UVA+UVB protection) and wide brim hats (4-inch brim around the entire circumference of the hat) are also recommended for sun protection.  - Call for new or changing lesions.  iSK glabella - tx w/ cryo today - rec bx if not resolved in 4-6 wks   Was sun protection counseling provided?: Yes   Procedures, orders, diagnosis for this visit:  INFLAMED SEBORRHEIC KERATOSIS glabella x 1 Symptomatic, irritating, patient would like treated.  If not resolved in 4 - 6 weeks please call or send mychart will recheck  - Destruction of lesion - glabella x 1 Complexity: simple   Destruction method: cryotherapy   Informed consent: discussed and consent obtained   Timeout:  patient name, date of birth, surgical site, and procedure verified Lesion destroyed using liquid nitrogen: Yes   Region frozen until ice ball extended beyond lesion: Yes   Outcome: patient tolerated procedure well with no complications   Post-procedure details: wound care instructions given    Inflamed seborrheic keratosis -     Destruction of lesion    Return to clinic: Return for keep follow up as scheduled .  I, Eleanor Blush, CMA, am acting as scribe for Lauraine JAYSON Kanaris, MD.   Documentation: I have reviewed the above documentation for accuracy and completeness, and I agree with the above.  Lauraine JAYSON Kanaris, MD  "

## 2024-01-21 NOTE — Patient Instructions (Addendum)
 If not resolved in 4 - 6 weeks please call or send mychart will recheck     Cryotherapy Aftercare  Wash gently with soap and water everyday.   Apply Vaseline and Band-Aid daily until healed.    Seborrheic Keratosis  What causes seborrheic keratoses? Seborrheic keratoses are harmless, common skin growths that first appear during adult life.  As time goes by, more growths appear.  Some people may develop a large number of them.  Seborrheic keratoses appear on both covered and uncovered body parts.  They are not caused by sunlight.  The tendency to develop seborrheic keratoses can be inherited.  They vary in color from skin-colored to gray, brown, or even black.  They can be either smooth or have a rough, warty surface.   Seborrheic keratoses are superficial and look as if they were stuck on the skin.  Under the microscope this type of keratosis looks like layers upon layers of skin.  That is why at times the top layer may seem to fall off, but the rest of the growth remains and re-grows.    Treatment Seborrheic keratoses do not need to be treated, but can easily be removed in the office.  Seborrheic keratoses often cause symptoms when they rub on clothing or jewelry.  Lesions can be in the way of shaving.  If they become inflamed, they can cause itching, soreness, or burning.  Removal of a seborrheic keratosis can be accomplished by freezing, burning, or surgery. If any spot bleeds, scabs, or grows rapidly, please return to have it checked, as these can be an indication of a skin cancer.    Eucerin Tinted Sunscreen Or  Elta MD tinted Sunscreen   Due to recent changes in healthcare laws, you may see results of your pathology and/or laboratory studies on MyChart before the doctors have had a chance to review them. We understand that in some cases there may be results that are confusing or concerning to you. Please understand that not all results are received at the same time and often the  doctors may need to interpret multiple results in order to provide you with the best plan of care or course of treatment. Therefore, we ask that you please give us  2 business days to thoroughly review all your results before contacting the office for clarification. Should we see a critical lab result, you will be contacted sooner.   If You Need Anything After Your Visit  If you have any questions or concerns for your doctor, please call our main line at [phone] and press option 4 to reach your doctor's medical assistant. If no one answers, please leave a voicemail as directed and we will return your call as soon as possible. Messages left after 4 pm will be answered the following business day.   You may also send us  a message via MyChart. We typically respond to MyChart messages within 1-2 business days.  For prescription refills, please ask your pharmacy to contact our office. Our fax number is [phone].  If you have an urgent issue when the clinic is closed that cannot wait until the next business day, you can page your doctor at the number below.    Please note that while we do our best to be available for urgent issues outside of office hours, we are not available 24/7.   If you have an urgent issue and are unable to reach us , you may choose to seek medical care at your doctor's office, retail clinic,  urgent care center, or emergency room.  If you have a medical emergency, please immediately call 911 or go to the emergency department.  Pager Numbers  - Dr. Hester: [phone]  - Dr. Jackquline: [phone]  - Dr. Claudene: [phone]   - Dr. Raymund: [phone]  In the event of inclement weather, please call our main line at [phone] for an update on the status of any delays or closures.  Dermatology Medication Tips: Please keep the boxes that topical medications come in in order to help keep track of the instructions about where and how to use these. Pharmacies  typically print the medication instructions only on the boxes and not directly on the medication tubes.   If your medication is too expensive, please contact our office at [phone] option 4 or send us  a message through MyChart.   We are unable to tell what your co-pay for medications will be in advance as this is different depending on your insurance coverage. However, we may be able to find a substitute medication at lower cost or fill out paperwork to get insurance to cover a needed medication.   If a prior authorization is required to get your medication covered by your insurance company, please allow us  1-2 business days to complete this process.  Drug prices often vary depending on where the prescription is filled and some pharmacies may offer cheaper prices.  The website www.goodrx.com contains coupons for medications through different pharmacies. The prices here do not account for what the cost may be with help from insurance (it may be cheaper with your insurance), but the website can give you the price if you did not use any insurance.  - You can print the associated coupon and take it with your prescription to the pharmacy.  - You may also stop by our office during regular business hours and pick up a GoodRx coupon card.  - If you need your prescription sent electronically to a different pharmacy, notify our office through [facility] or by phone at [phone] option 4.     Si Usted Necesita Algo Despus de Su Visita  Tambin puede enviarnos un mensaje a travs de Clinical Cytogeneticist. Por lo general respondemos a los mensajes de MyChart en el transcurso de 1 a 2 das hbiles.  Para renovar recetas, por favor pida a su farmacia que se ponga en contacto con nuestra oficina. Randi lakes de fax es [place] [phone].  Si tiene un asunto urgente cuando la clnica est cerrada y que no puede esperar hasta el siguiente da hbil, puede llamar/localizar a su doctor(a) al nmero que  aparece a continuacin.   Por favor, tenga en cuenta que aunque hacemos todo lo posible para estar disponibles para asuntos urgentes fuera del horario de [place], no estamos disponibles las 24 horas del da, los 7 [address]  [address] de la [place].   Si tiene un problema urgente y no puede comunicarse con nosotros, puede optar por buscar atencin mdica  en el consultorio de su doctor(a), en una clnica privada, en un centro de atencin urgente o en una sala de emergencias.  Si tiene engineer, drilling, por favor llame inmediatamente al 911 o vaya a la sala de emergencias.  Nmeros de bper  - Dr. Hester: [phone]  - Dra. Jackquline: [phone]  - Dr. Claudene: [phone]  - Dra. Kitts: [phone]  En caso de inclemencias del [place], por favor llame a nuestra lnea principal al [phone] para una actualizacin sobre el estado de cualquier retraso o cierre.  Consejos para la medicacin en dermatologa: Por favor, guarde las cajas en las que vienen los medicamentos de uso tpico para ayudarle a seguir las instrucciones sobre dnde y cmo usarlos. Las farmacias generalmente imprimen las instrucciones del medicamento slo en las cajas y no directamente en los tubos del [place].   Si su medicamento es muy caro, por favor, pngase en contacto con landry rieger llamando al [phone] y presione la opcin 4 o envenos un mensaje a travs de Clinical Cytogeneticist.   No podemos decirle cul ser su copago por los medicamentos por adelantado ya que esto es diferente dependiendo de la cobertura de su seguro. Sin embargo, es posible que podamos encontrar un medicamento sustituto a audiological scientist un formulario para que el seguro cubra el medicamento que se considera necesario.   Si se requiere una autorizacin previa para que su compaa de seguros cubra su medicamento, por favor permtanos de 1 a 2 das hbiles para completar este proceso.  Los precios de los medicamentos varan con frecuencia  dependiendo del environmental consultant de dnde se surte la receta y alguna farmacias pueden ofrecer precios ms baratos.  El sitio web www.goodrx.com tiene cupones para medicamentos de health and safety inspector. Los precios aqu no tienen en cuenta lo que podra costar con la ayuda del seguro (puede ser ms barato con su seguro), pero el sitio web puede darle el precio si no utiliz tourist information centre manager.  - Puede imprimir el cupn correspondiente y llevarlo con su receta a la farmacia.  - Tambin puede pasar por nuestra oficina durante el horario de atencin regular y education officer, museum una tarjeta de cupones de GoodRx.  - Si necesita que su receta se enve electrnicamente a una farmacia diferente, informe a nuestra oficina a travs de MyChart de [place] o por telfono llamando al [phone] y presione la opcin 4.

## 2024-05-26 ENCOUNTER — Ambulatory Visit

## 2024-05-27 ENCOUNTER — Ambulatory Visit: Admitting: Dermatology
